# Patient Record
Sex: Male | Born: 1935 | Race: White | Hispanic: No | Marital: Married | Smoking: Never smoker
Health system: Southern US, Community
[De-identification: ages and names within clinical notes are randomized; demographics above are authoritative.]

## PROBLEM LIST (undated history)

## (undated) DIAGNOSIS — Z87442 Personal history of urinary calculi: Secondary | ICD-10-CM

## (undated) DIAGNOSIS — K219 Gastro-esophageal reflux disease without esophagitis: Secondary | ICD-10-CM

## (undated) DIAGNOSIS — Z8781 Personal history of (healed) traumatic fracture: Secondary | ICD-10-CM

## (undated) DIAGNOSIS — Z9221 Personal history of antineoplastic chemotherapy: Secondary | ICD-10-CM

## (undated) DIAGNOSIS — E538 Deficiency of other specified B group vitamins: Secondary | ICD-10-CM

## (undated) DIAGNOSIS — F419 Anxiety disorder, unspecified: Secondary | ICD-10-CM

## (undated) DIAGNOSIS — Z923 Personal history of irradiation: Secondary | ICD-10-CM

## (undated) DIAGNOSIS — R413 Other amnesia: Secondary | ICD-10-CM

## (undated) DIAGNOSIS — G9001 Carotid sinus syncope: Secondary | ICD-10-CM

## (undated) DIAGNOSIS — T7840XA Allergy, unspecified, initial encounter: Secondary | ICD-10-CM

## (undated) DIAGNOSIS — N3281 Overactive bladder: Secondary | ICD-10-CM

## (undated) DIAGNOSIS — I495 Sick sinus syndrome: Secondary | ICD-10-CM

## (undated) DIAGNOSIS — C189 Malignant neoplasm of colon, unspecified: Secondary | ICD-10-CM

## (undated) DIAGNOSIS — M1990 Unspecified osteoarthritis, unspecified site: Secondary | ICD-10-CM

## (undated) DIAGNOSIS — Z8719 Personal history of other diseases of the digestive system: Secondary | ICD-10-CM

## (undated) DIAGNOSIS — G629 Polyneuropathy, unspecified: Secondary | ICD-10-CM

## (undated) DIAGNOSIS — Z950 Presence of cardiac pacemaker: Secondary | ICD-10-CM

## (undated) DIAGNOSIS — I82409 Acute embolism and thrombosis of unspecified deep veins of unspecified lower extremity: Secondary | ICD-10-CM

## (undated) DIAGNOSIS — R001 Bradycardia, unspecified: Secondary | ICD-10-CM

## (undated) HISTORY — PX: HERNIA REPAIR: SHX51

## (undated) HISTORY — DX: Malignant neoplasm of colon, unspecified: C18.9

## (undated) HISTORY — PX: EXPLORATORY LAPAROTOMY: SUR591

## (undated) HISTORY — DX: Personal history of urinary calculi: Z87.442

## (undated) HISTORY — PX: TONSILLECTOMY: SUR1361

## (undated) HISTORY — PX: COLECTOMY: SHX59

## (undated) HISTORY — PX: LITHOTRIPSY: SUR834

## (undated) HISTORY — DX: Sick sinus syndrome: I49.5

## (undated) HISTORY — PX: COLON RESECTION: SHX5231

## (undated) HISTORY — PX: APPENDECTOMY: SHX54

## (undated) HISTORY — DX: Acute embolism and thrombosis of unspecified deep veins of unspecified lower extremity: I82.409

## (undated) HISTORY — DX: Overactive bladder: N32.81

## (undated) HISTORY — PX: TOTAL KNEE ARTHROPLASTY: SHX125

## (undated) HISTORY — PX: CATARACT EXTRACTION: SUR2

## (undated) HISTORY — DX: Polyneuropathy, unspecified: G62.9

## (undated) HISTORY — DX: Deficiency of other specified B group vitamins: E53.8

## (undated) HISTORY — PX: REPLACEMENT TOTAL KNEE BILATERAL: SUR1225

## (undated) HISTORY — DX: Unspecified osteoarthritis, unspecified site: M19.90

## (undated) HISTORY — PX: OTHER SURGICAL HISTORY: SHX169

## (undated) HISTORY — DX: Personal history of (healed) traumatic fracture: Z87.81

## (undated) HISTORY — DX: Carotid sinus syncope: G90.01

---

## 2000-05-17 ENCOUNTER — Encounter (HOSPITAL_COMMUNITY): Admission: RE | Admit: 2000-05-17 | Discharge: 2000-06-16 | Payer: Self-pay | Admitting: Oncology

## 2000-05-17 ENCOUNTER — Encounter: Admission: RE | Admit: 2000-05-17 | Discharge: 2000-05-17 | Payer: Self-pay | Admitting: Oncology

## 2000-06-07 ENCOUNTER — Encounter (HOSPITAL_COMMUNITY): Payer: Self-pay | Admitting: Oncology

## 2000-06-17 ENCOUNTER — Encounter: Admission: RE | Admit: 2000-06-17 | Discharge: 2000-06-17 | Payer: Self-pay | Admitting: Oncology

## 2000-06-17 ENCOUNTER — Encounter (HOSPITAL_COMMUNITY): Admission: RE | Admit: 2000-06-17 | Discharge: 2000-07-17 | Payer: Self-pay | Admitting: Oncology

## 2000-07-17 ENCOUNTER — Encounter (HOSPITAL_COMMUNITY): Admission: RE | Admit: 2000-07-17 | Discharge: 2000-08-16 | Payer: Self-pay | Admitting: Oncology

## 2000-07-17 ENCOUNTER — Encounter: Admission: RE | Admit: 2000-07-17 | Discharge: 2000-07-17 | Payer: Self-pay | Admitting: Oncology

## 2000-07-22 ENCOUNTER — Inpatient Hospital Stay (HOSPITAL_COMMUNITY): Admission: AD | Admit: 2000-07-22 | Discharge: 2000-07-24 | Payer: Self-pay | Admitting: General Surgery

## 2000-07-23 ENCOUNTER — Encounter: Payer: Self-pay | Admitting: General Surgery

## 2000-08-09 ENCOUNTER — Ambulatory Visit: Admission: RE | Admit: 2000-08-09 | Discharge: 2000-11-07 | Payer: Self-pay | Admitting: *Deleted

## 2000-08-26 ENCOUNTER — Encounter (HOSPITAL_COMMUNITY): Admission: RE | Admit: 2000-08-26 | Discharge: 2000-09-25 | Payer: Self-pay | Admitting: Oncology

## 2000-08-26 ENCOUNTER — Encounter: Admission: RE | Admit: 2000-08-26 | Discharge: 2000-08-26 | Payer: Self-pay | Admitting: Oncology

## 2000-09-27 ENCOUNTER — Encounter: Admission: RE | Admit: 2000-09-27 | Discharge: 2000-09-27 | Payer: Self-pay | Admitting: Oncology

## 2000-09-27 ENCOUNTER — Encounter (HOSPITAL_COMMUNITY): Admission: RE | Admit: 2000-09-27 | Discharge: 2000-10-27 | Payer: Self-pay | Admitting: Oncology

## 2000-10-28 ENCOUNTER — Encounter: Admission: RE | Admit: 2000-10-28 | Discharge: 2000-10-28 | Payer: Self-pay | Admitting: Oncology

## 2000-10-28 ENCOUNTER — Encounter (HOSPITAL_COMMUNITY): Admission: RE | Admit: 2000-10-28 | Discharge: 2000-11-27 | Payer: Self-pay | Admitting: Oncology

## 2000-11-29 ENCOUNTER — Encounter (HOSPITAL_COMMUNITY): Admission: RE | Admit: 2000-11-29 | Discharge: 2000-12-29 | Payer: Self-pay

## 2000-12-24 ENCOUNTER — Encounter (HOSPITAL_COMMUNITY): Payer: Self-pay | Admitting: Oncology

## 2000-12-31 ENCOUNTER — Encounter (HOSPITAL_COMMUNITY): Admission: RE | Admit: 2000-12-31 | Discharge: 2001-01-30 | Payer: Self-pay | Admitting: Oncology

## 2000-12-31 ENCOUNTER — Encounter: Admission: RE | Admit: 2000-12-31 | Discharge: 2000-12-31 | Payer: Self-pay | Admitting: Oncology

## 2001-01-01 ENCOUNTER — Encounter (HOSPITAL_COMMUNITY): Payer: Self-pay | Admitting: Oncology

## 2001-02-04 ENCOUNTER — Encounter: Admission: RE | Admit: 2001-02-04 | Discharge: 2001-02-04 | Payer: Self-pay | Admitting: Oncology

## 2001-02-04 ENCOUNTER — Encounter (HOSPITAL_COMMUNITY): Admission: RE | Admit: 2001-02-04 | Discharge: 2001-03-06 | Payer: Self-pay | Admitting: Oncology

## 2001-03-05 ENCOUNTER — Encounter (HOSPITAL_COMMUNITY): Payer: Self-pay | Admitting: Oncology

## 2001-03-12 ENCOUNTER — Encounter (HOSPITAL_COMMUNITY): Admission: RE | Admit: 2001-03-12 | Discharge: 2001-04-11 | Payer: Self-pay | Admitting: Oncology

## 2001-03-13 ENCOUNTER — Emergency Department (HOSPITAL_COMMUNITY): Admission: EM | Admit: 2001-03-13 | Discharge: 2001-03-13 | Payer: Self-pay | Admitting: Emergency Medicine

## 2001-03-13 ENCOUNTER — Encounter: Payer: Self-pay | Admitting: Emergency Medicine

## 2001-04-07 ENCOUNTER — Encounter (HOSPITAL_COMMUNITY): Payer: Self-pay | Admitting: Oncology

## 2001-04-09 ENCOUNTER — Encounter (HOSPITAL_COMMUNITY): Payer: Self-pay | Admitting: Oncology

## 2001-04-09 ENCOUNTER — Inpatient Hospital Stay (HOSPITAL_COMMUNITY): Admission: AD | Admit: 2001-04-09 | Discharge: 2001-04-11 | Payer: Self-pay | Admitting: Internal Medicine

## 2001-04-15 ENCOUNTER — Encounter: Admission: RE | Admit: 2001-04-15 | Discharge: 2001-04-15 | Payer: Self-pay | Admitting: Oncology

## 2001-04-15 ENCOUNTER — Encounter (HOSPITAL_COMMUNITY): Payer: Self-pay | Admitting: Oncology

## 2001-04-15 ENCOUNTER — Encounter (HOSPITAL_COMMUNITY): Admission: RE | Admit: 2001-04-15 | Discharge: 2001-05-15 | Payer: Self-pay | Admitting: Oncology

## 2001-04-25 ENCOUNTER — Encounter (HOSPITAL_COMMUNITY): Payer: Self-pay | Admitting: Oncology

## 2001-05-01 ENCOUNTER — Encounter: Payer: Self-pay | Admitting: *Deleted

## 2001-05-01 ENCOUNTER — Inpatient Hospital Stay (HOSPITAL_COMMUNITY): Admission: EM | Admit: 2001-05-01 | Discharge: 2001-05-02 | Payer: Self-pay | Admitting: *Deleted

## 2001-05-21 ENCOUNTER — Encounter (HOSPITAL_COMMUNITY): Admission: RE | Admit: 2001-05-21 | Discharge: 2001-06-20 | Payer: Self-pay | Admitting: Oncology

## 2001-06-25 ENCOUNTER — Encounter: Admission: RE | Admit: 2001-06-25 | Discharge: 2001-06-25 | Payer: Self-pay | Admitting: Oncology

## 2001-06-25 ENCOUNTER — Encounter (HOSPITAL_COMMUNITY): Admission: RE | Admit: 2001-06-25 | Discharge: 2001-07-25 | Payer: Self-pay | Admitting: Oncology

## 2001-07-31 ENCOUNTER — Encounter (HOSPITAL_COMMUNITY): Admission: RE | Admit: 2001-07-31 | Discharge: 2001-08-30 | Payer: Self-pay | Admitting: Oncology

## 2001-07-31 ENCOUNTER — Encounter: Admission: RE | Admit: 2001-07-31 | Discharge: 2001-07-31 | Payer: Self-pay | Admitting: Oncology

## 2001-08-15 ENCOUNTER — Encounter (HOSPITAL_COMMUNITY): Payer: Self-pay | Admitting: Oncology

## 2001-08-26 ENCOUNTER — Encounter (HOSPITAL_COMMUNITY): Payer: Self-pay | Admitting: Oncology

## 2001-09-09 ENCOUNTER — Encounter (HOSPITAL_COMMUNITY): Admission: RE | Admit: 2001-09-09 | Discharge: 2001-10-09 | Payer: Self-pay | Admitting: Oncology

## 2001-09-09 ENCOUNTER — Encounter: Admission: RE | Admit: 2001-09-09 | Discharge: 2001-09-09 | Payer: Self-pay | Admitting: Oncology

## 2001-11-05 ENCOUNTER — Encounter: Admission: RE | Admit: 2001-11-05 | Discharge: 2001-11-05 | Payer: Self-pay | Admitting: Oncology

## 2001-11-05 ENCOUNTER — Encounter (HOSPITAL_COMMUNITY): Admission: RE | Admit: 2001-11-05 | Discharge: 2001-12-05 | Payer: Self-pay | Admitting: Oncology

## 2001-12-15 ENCOUNTER — Encounter (HOSPITAL_COMMUNITY): Admission: RE | Admit: 2001-12-15 | Discharge: 2002-01-14 | Payer: Self-pay | Admitting: Oncology

## 2001-12-15 ENCOUNTER — Encounter: Admission: RE | Admit: 2001-12-15 | Discharge: 2001-12-15 | Payer: Self-pay | Admitting: Oncology

## 2002-02-12 ENCOUNTER — Encounter (HOSPITAL_COMMUNITY): Admission: RE | Admit: 2002-02-12 | Discharge: 2002-03-14 | Payer: Self-pay | Admitting: Oncology

## 2002-02-12 ENCOUNTER — Encounter: Admission: RE | Admit: 2002-02-12 | Discharge: 2002-02-12 | Payer: Self-pay | Admitting: Oncology

## 2002-02-26 ENCOUNTER — Encounter (HOSPITAL_COMMUNITY): Payer: Self-pay | Admitting: Oncology

## 2002-03-09 ENCOUNTER — Encounter (HOSPITAL_COMMUNITY): Payer: Self-pay | Admitting: Oncology

## 2002-03-25 ENCOUNTER — Encounter (HOSPITAL_COMMUNITY): Admission: RE | Admit: 2002-03-25 | Discharge: 2002-04-24 | Payer: Self-pay | Admitting: Oncology

## 2002-03-25 ENCOUNTER — Encounter: Admission: RE | Admit: 2002-03-25 | Discharge: 2002-03-25 | Payer: Self-pay | Admitting: Oncology

## 2002-04-27 ENCOUNTER — Encounter (HOSPITAL_COMMUNITY): Admission: RE | Admit: 2002-04-27 | Discharge: 2002-05-27 | Payer: Self-pay | Admitting: Oncology

## 2002-04-27 ENCOUNTER — Encounter: Admission: RE | Admit: 2002-04-27 | Discharge: 2002-04-27 | Payer: Self-pay | Admitting: Oncology

## 2002-05-26 ENCOUNTER — Encounter (HOSPITAL_COMMUNITY): Admission: RE | Admit: 2002-05-26 | Discharge: 2002-06-25 | Payer: Self-pay | Admitting: Oncology

## 2002-05-26 ENCOUNTER — Encounter: Admission: RE | Admit: 2002-05-26 | Discharge: 2002-05-26 | Payer: Self-pay | Admitting: Oncology

## 2002-05-28 ENCOUNTER — Ambulatory Visit (HOSPITAL_COMMUNITY): Admission: RE | Admit: 2002-05-28 | Discharge: 2002-05-28 | Payer: Self-pay | Admitting: General Surgery

## 2002-06-26 ENCOUNTER — Encounter: Admission: RE | Admit: 2002-06-26 | Discharge: 2002-06-26 | Payer: Self-pay | Admitting: Oncology

## 2002-06-26 ENCOUNTER — Encounter (HOSPITAL_COMMUNITY): Admission: RE | Admit: 2002-06-26 | Discharge: 2002-07-26 | Payer: Self-pay | Admitting: Oncology

## 2002-07-31 ENCOUNTER — Encounter (HOSPITAL_COMMUNITY): Admission: RE | Admit: 2002-07-31 | Discharge: 2002-08-30 | Payer: Self-pay | Admitting: Oncology

## 2002-07-31 ENCOUNTER — Encounter: Admission: RE | Admit: 2002-07-31 | Discharge: 2002-07-31 | Payer: Self-pay | Admitting: Oncology

## 2002-09-14 ENCOUNTER — Encounter (HOSPITAL_COMMUNITY): Admission: RE | Admit: 2002-09-14 | Discharge: 2002-10-14 | Payer: Self-pay | Admitting: Oncology

## 2002-09-14 ENCOUNTER — Encounter: Admission: RE | Admit: 2002-09-14 | Discharge: 2002-09-14 | Payer: Self-pay | Admitting: Oncology

## 2002-10-19 ENCOUNTER — Encounter (HOSPITAL_COMMUNITY): Admission: RE | Admit: 2002-10-19 | Discharge: 2002-11-18 | Payer: Self-pay | Admitting: Oncology

## 2002-10-19 ENCOUNTER — Encounter: Admission: RE | Admit: 2002-10-19 | Discharge: 2002-10-19 | Payer: Self-pay | Admitting: Oncology

## 2002-12-01 ENCOUNTER — Encounter (HOSPITAL_COMMUNITY): Admission: RE | Admit: 2002-12-01 | Discharge: 2002-12-31 | Payer: Self-pay | Admitting: Oncology

## 2002-12-01 ENCOUNTER — Encounter: Admission: RE | Admit: 2002-12-01 | Discharge: 2002-12-01 | Payer: Self-pay | Admitting: Oncology

## 2003-01-04 ENCOUNTER — Encounter (HOSPITAL_COMMUNITY): Admission: RE | Admit: 2003-01-04 | Discharge: 2003-02-03 | Payer: Self-pay | Admitting: Oncology

## 2003-01-04 ENCOUNTER — Encounter: Admission: RE | Admit: 2003-01-04 | Discharge: 2003-01-04 | Payer: Self-pay | Admitting: Oncology

## 2003-02-23 ENCOUNTER — Encounter: Admission: RE | Admit: 2003-02-23 | Discharge: 2003-02-23 | Payer: Self-pay | Admitting: Oncology

## 2003-02-23 ENCOUNTER — Encounter (HOSPITAL_COMMUNITY): Admission: RE | Admit: 2003-02-23 | Discharge: 2003-03-25 | Payer: Self-pay | Admitting: Oncology

## 2003-03-31 ENCOUNTER — Ambulatory Visit (HOSPITAL_COMMUNITY): Admission: RE | Admit: 2003-03-31 | Discharge: 2003-03-31 | Payer: Self-pay | Admitting: Oncology

## 2003-04-20 ENCOUNTER — Encounter: Admission: RE | Admit: 2003-04-20 | Discharge: 2003-04-20 | Payer: Self-pay | Admitting: Oncology

## 2003-04-20 ENCOUNTER — Encounter (HOSPITAL_COMMUNITY): Admission: RE | Admit: 2003-04-20 | Discharge: 2003-05-20 | Payer: Self-pay | Admitting: Oncology

## 2003-06-28 ENCOUNTER — Encounter (HOSPITAL_COMMUNITY): Admission: RE | Admit: 2003-06-28 | Discharge: 2003-07-28 | Payer: Self-pay | Admitting: Oncology

## 2003-06-28 ENCOUNTER — Encounter: Admission: RE | Admit: 2003-06-28 | Discharge: 2003-06-28 | Payer: Self-pay | Admitting: Oncology

## 2003-08-23 ENCOUNTER — Encounter: Admission: RE | Admit: 2003-08-23 | Discharge: 2003-08-23 | Payer: Self-pay | Admitting: Oncology

## 2003-08-23 ENCOUNTER — Encounter (HOSPITAL_COMMUNITY): Admission: RE | Admit: 2003-08-23 | Discharge: 2003-09-22 | Payer: Self-pay | Admitting: Oncology

## 2003-10-21 ENCOUNTER — Encounter (HOSPITAL_COMMUNITY): Admission: RE | Admit: 2003-10-21 | Discharge: 2003-11-20 | Payer: Self-pay | Admitting: Oncology

## 2003-10-21 ENCOUNTER — Encounter: Admission: RE | Admit: 2003-10-21 | Discharge: 2003-10-21 | Payer: Self-pay | Admitting: Oncology

## 2003-11-18 ENCOUNTER — Ambulatory Visit (HOSPITAL_COMMUNITY): Payer: Self-pay | Admitting: Oncology

## 2003-12-21 ENCOUNTER — Encounter: Admission: RE | Admit: 2003-12-21 | Discharge: 2003-12-21 | Payer: Self-pay | Admitting: Oncology

## 2003-12-21 ENCOUNTER — Encounter (HOSPITAL_COMMUNITY): Admission: RE | Admit: 2003-12-21 | Discharge: 2004-01-20 | Payer: Self-pay | Admitting: Oncology

## 2004-01-18 ENCOUNTER — Ambulatory Visit (HOSPITAL_COMMUNITY): Payer: Self-pay | Admitting: Oncology

## 2004-02-18 ENCOUNTER — Encounter (HOSPITAL_COMMUNITY): Admission: RE | Admit: 2004-02-18 | Discharge: 2004-03-19 | Payer: Self-pay | Admitting: Oncology

## 2004-02-18 ENCOUNTER — Encounter: Admission: RE | Admit: 2004-02-18 | Discharge: 2004-02-18 | Payer: Self-pay | Admitting: Oncology

## 2004-03-27 ENCOUNTER — Ambulatory Visit (HOSPITAL_COMMUNITY): Admission: RE | Admit: 2004-03-27 | Discharge: 2004-03-27 | Payer: Self-pay | Admitting: Oncology

## 2004-04-04 ENCOUNTER — Ambulatory Visit (HOSPITAL_COMMUNITY): Payer: Self-pay | Admitting: Oncology

## 2004-04-04 ENCOUNTER — Encounter: Admission: RE | Admit: 2004-04-04 | Discharge: 2004-04-04 | Payer: Self-pay | Admitting: Oncology

## 2004-04-04 ENCOUNTER — Encounter (HOSPITAL_COMMUNITY): Admission: RE | Admit: 2004-04-04 | Discharge: 2004-05-04 | Payer: Self-pay | Admitting: Oncology

## 2004-05-08 ENCOUNTER — Encounter (HOSPITAL_COMMUNITY): Admission: RE | Admit: 2004-05-08 | Discharge: 2004-06-07 | Payer: Self-pay | Admitting: Oncology

## 2004-05-08 ENCOUNTER — Encounter: Admission: RE | Admit: 2004-05-08 | Discharge: 2004-05-08 | Payer: Self-pay | Admitting: Oncology

## 2004-05-31 ENCOUNTER — Ambulatory Visit (HOSPITAL_COMMUNITY): Payer: Self-pay | Admitting: Oncology

## 2004-06-07 ENCOUNTER — Inpatient Hospital Stay (HOSPITAL_COMMUNITY): Admission: RE | Admit: 2004-06-07 | Discharge: 2004-06-11 | Payer: Self-pay | Admitting: Orthopedic Surgery

## 2004-06-14 ENCOUNTER — Encounter: Admission: RE | Admit: 2004-06-14 | Discharge: 2004-06-14 | Payer: Self-pay | Admitting: Oncology

## 2004-06-14 ENCOUNTER — Encounter (HOSPITAL_COMMUNITY): Admission: RE | Admit: 2004-06-14 | Discharge: 2004-07-14 | Payer: Self-pay | Admitting: Oncology

## 2004-07-27 ENCOUNTER — Encounter: Admission: RE | Admit: 2004-07-27 | Discharge: 2004-07-27 | Payer: Self-pay | Admitting: Oncology

## 2004-07-27 ENCOUNTER — Ambulatory Visit (HOSPITAL_COMMUNITY): Payer: Self-pay | Admitting: Oncology

## 2004-07-27 ENCOUNTER — Encounter (HOSPITAL_COMMUNITY): Admission: RE | Admit: 2004-07-27 | Discharge: 2004-08-26 | Payer: Self-pay | Admitting: Oncology

## 2004-08-16 ENCOUNTER — Emergency Department (HOSPITAL_COMMUNITY): Admission: EM | Admit: 2004-08-16 | Discharge: 2004-08-16 | Payer: Self-pay | Admitting: Emergency Medicine

## 2004-09-04 ENCOUNTER — Encounter (HOSPITAL_COMMUNITY): Admission: RE | Admit: 2004-09-04 | Discharge: 2004-10-04 | Payer: Self-pay | Admitting: Oncology

## 2004-09-04 ENCOUNTER — Encounter: Admission: RE | Admit: 2004-09-04 | Discharge: 2004-09-04 | Payer: Self-pay | Admitting: Oncology

## 2004-10-02 ENCOUNTER — Ambulatory Visit (HOSPITAL_COMMUNITY): Payer: Self-pay | Admitting: Oncology

## 2004-10-30 ENCOUNTER — Encounter: Admission: RE | Admit: 2004-10-30 | Discharge: 2004-10-30 | Payer: Self-pay | Admitting: Oncology

## 2004-10-30 ENCOUNTER — Encounter (HOSPITAL_COMMUNITY): Admission: RE | Admit: 2004-10-30 | Discharge: 2004-11-29 | Payer: Self-pay | Admitting: Oncology

## 2004-11-27 ENCOUNTER — Ambulatory Visit (HOSPITAL_COMMUNITY): Payer: Self-pay | Admitting: Oncology

## 2004-12-25 ENCOUNTER — Encounter: Admission: RE | Admit: 2004-12-25 | Discharge: 2004-12-25 | Payer: Self-pay | Admitting: Oncology

## 2004-12-25 ENCOUNTER — Encounter (HOSPITAL_COMMUNITY): Admission: RE | Admit: 2004-12-25 | Discharge: 2004-12-25 | Payer: Self-pay | Admitting: Oncology

## 2005-01-22 ENCOUNTER — Encounter: Admission: RE | Admit: 2005-01-22 | Discharge: 2005-01-22 | Payer: Self-pay | Admitting: Oncology

## 2005-01-22 ENCOUNTER — Encounter (HOSPITAL_COMMUNITY): Admission: RE | Admit: 2005-01-22 | Discharge: 2005-02-21 | Payer: Self-pay | Admitting: Oncology

## 2005-01-22 ENCOUNTER — Ambulatory Visit (HOSPITAL_COMMUNITY): Payer: Self-pay | Admitting: Oncology

## 2005-03-15 ENCOUNTER — Encounter: Admission: RE | Admit: 2005-03-15 | Discharge: 2005-03-15 | Payer: Self-pay | Admitting: Oncology

## 2005-03-15 ENCOUNTER — Encounter (HOSPITAL_COMMUNITY): Admission: RE | Admit: 2005-03-15 | Discharge: 2005-04-14 | Payer: Self-pay | Admitting: Oncology

## 2005-03-15 ENCOUNTER — Ambulatory Visit (HOSPITAL_COMMUNITY): Payer: Self-pay | Admitting: Oncology

## 2005-03-22 ENCOUNTER — Ambulatory Visit (HOSPITAL_COMMUNITY): Admission: RE | Admit: 2005-03-22 | Discharge: 2005-03-22 | Payer: Self-pay | Admitting: Oncology

## 2005-04-04 ENCOUNTER — Ambulatory Visit (HOSPITAL_COMMUNITY): Admission: RE | Admit: 2005-04-04 | Discharge: 2005-04-04 | Payer: Self-pay | Admitting: General Surgery

## 2005-04-05 ENCOUNTER — Ambulatory Visit (HOSPITAL_BASED_OUTPATIENT_CLINIC_OR_DEPARTMENT_OTHER): Admission: RE | Admit: 2005-04-05 | Discharge: 2005-04-05 | Payer: Self-pay | Admitting: Orthopedic Surgery

## 2005-04-18 ENCOUNTER — Encounter: Admission: RE | Admit: 2005-04-18 | Discharge: 2005-04-18 | Payer: Self-pay | Admitting: Oncology

## 2005-04-30 ENCOUNTER — Ambulatory Visit (HOSPITAL_COMMUNITY): Payer: Self-pay | Admitting: Oncology

## 2005-05-21 ENCOUNTER — Encounter: Admission: RE | Admit: 2005-05-21 | Discharge: 2005-05-21 | Payer: Self-pay | Admitting: Oncology

## 2005-05-21 ENCOUNTER — Encounter (HOSPITAL_COMMUNITY): Admission: RE | Admit: 2005-05-21 | Discharge: 2005-06-20 | Payer: Self-pay | Admitting: Oncology

## 2005-07-02 ENCOUNTER — Encounter: Admission: RE | Admit: 2005-07-02 | Discharge: 2005-07-02 | Payer: Self-pay | Admitting: Oncology

## 2005-07-02 ENCOUNTER — Ambulatory Visit (HOSPITAL_COMMUNITY): Payer: Self-pay | Admitting: Oncology

## 2005-07-02 ENCOUNTER — Encounter (HOSPITAL_COMMUNITY): Admission: RE | Admit: 2005-07-02 | Discharge: 2005-08-01 | Payer: Self-pay | Admitting: Oncology

## 2005-08-31 ENCOUNTER — Encounter: Admission: RE | Admit: 2005-08-31 | Discharge: 2005-08-31 | Payer: Self-pay | Admitting: Oncology

## 2005-08-31 ENCOUNTER — Ambulatory Visit (HOSPITAL_COMMUNITY): Payer: Self-pay | Admitting: Oncology

## 2005-08-31 ENCOUNTER — Encounter (HOSPITAL_COMMUNITY): Admission: RE | Admit: 2005-08-31 | Discharge: 2005-09-30 | Payer: Self-pay | Admitting: Oncology

## 2005-10-03 ENCOUNTER — Encounter (HOSPITAL_COMMUNITY): Admission: RE | Admit: 2005-10-03 | Discharge: 2005-10-12 | Payer: Self-pay | Admitting: Oncology

## 2005-10-03 ENCOUNTER — Encounter: Admission: RE | Admit: 2005-10-03 | Discharge: 2005-10-12 | Payer: Self-pay | Admitting: Oncology

## 2005-11-07 ENCOUNTER — Encounter (HOSPITAL_COMMUNITY): Admission: RE | Admit: 2005-11-07 | Discharge: 2005-12-07 | Payer: Self-pay | Admitting: Oncology

## 2005-11-07 ENCOUNTER — Encounter: Admission: RE | Admit: 2005-11-07 | Discharge: 2005-11-07 | Payer: Self-pay | Admitting: Oncology

## 2005-11-07 ENCOUNTER — Ambulatory Visit (HOSPITAL_COMMUNITY): Payer: Self-pay | Admitting: Oncology

## 2005-11-29 ENCOUNTER — Ambulatory Visit: Payer: Self-pay | Admitting: Internal Medicine

## 2005-12-12 DIAGNOSIS — Z87442 Personal history of urinary calculi: Secondary | ICD-10-CM

## 2005-12-12 DIAGNOSIS — Z85038 Personal history of other malignant neoplasm of large intestine: Secondary | ICD-10-CM | POA: Insufficient documentation

## 2005-12-12 DIAGNOSIS — Z95818 Presence of other cardiac implants and grafts: Secondary | ICD-10-CM

## 2005-12-12 DIAGNOSIS — H269 Unspecified cataract: Secondary | ICD-10-CM

## 2005-12-12 DIAGNOSIS — Z86718 Personal history of other venous thrombosis and embolism: Secondary | ICD-10-CM

## 2005-12-12 DIAGNOSIS — N318 Other neuromuscular dysfunction of bladder: Secondary | ICD-10-CM

## 2005-12-12 DIAGNOSIS — M129 Arthropathy, unspecified: Secondary | ICD-10-CM | POA: Insufficient documentation

## 2006-01-02 ENCOUNTER — Ambulatory Visit (HOSPITAL_COMMUNITY): Payer: Self-pay | Admitting: Oncology

## 2006-01-02 ENCOUNTER — Encounter (HOSPITAL_COMMUNITY): Admission: RE | Admit: 2006-01-02 | Discharge: 2006-01-14 | Payer: Self-pay | Admitting: Oncology

## 2006-01-30 ENCOUNTER — Encounter (HOSPITAL_COMMUNITY): Admission: RE | Admit: 2006-01-30 | Discharge: 2006-03-01 | Payer: Self-pay | Admitting: Oncology

## 2006-03-06 ENCOUNTER — Encounter (HOSPITAL_COMMUNITY): Admission: RE | Admit: 2006-03-06 | Discharge: 2006-04-05 | Payer: Self-pay | Admitting: Oncology

## 2006-03-06 ENCOUNTER — Ambulatory Visit (HOSPITAL_COMMUNITY): Payer: Self-pay | Admitting: Oncology

## 2006-03-18 ENCOUNTER — Ambulatory Visit (HOSPITAL_COMMUNITY): Admission: RE | Admit: 2006-03-18 | Discharge: 2006-03-18 | Payer: Self-pay | Admitting: Oncology

## 2006-04-04 ENCOUNTER — Ambulatory Visit: Payer: Self-pay | Admitting: Internal Medicine

## 2006-04-04 DIAGNOSIS — R030 Elevated blood-pressure reading, without diagnosis of hypertension: Secondary | ICD-10-CM

## 2006-04-04 DIAGNOSIS — G609 Hereditary and idiopathic neuropathy, unspecified: Secondary | ICD-10-CM | POA: Insufficient documentation

## 2006-04-04 DIAGNOSIS — M79609 Pain in unspecified limb: Secondary | ICD-10-CM | POA: Insufficient documentation

## 2006-04-08 ENCOUNTER — Encounter (INDEPENDENT_AMBULATORY_CARE_PROVIDER_SITE_OTHER): Payer: Self-pay | Admitting: Internal Medicine

## 2006-04-09 ENCOUNTER — Ambulatory Visit: Payer: Self-pay | Admitting: Internal Medicine

## 2006-04-16 ENCOUNTER — Ambulatory Visit: Payer: Self-pay | Admitting: Internal Medicine

## 2006-04-19 ENCOUNTER — Encounter (INDEPENDENT_AMBULATORY_CARE_PROVIDER_SITE_OTHER): Payer: Self-pay | Admitting: Internal Medicine

## 2006-04-23 ENCOUNTER — Ambulatory Visit: Payer: Self-pay | Admitting: Internal Medicine

## 2006-05-02 ENCOUNTER — Ambulatory Visit (HOSPITAL_COMMUNITY): Payer: Self-pay | Admitting: Oncology

## 2006-05-02 ENCOUNTER — Ambulatory Visit: Payer: Self-pay | Admitting: Internal Medicine

## 2006-05-02 ENCOUNTER — Encounter (HOSPITAL_COMMUNITY): Admission: RE | Admit: 2006-05-02 | Discharge: 2006-06-01 | Payer: Self-pay | Admitting: Oncology

## 2006-05-02 DIAGNOSIS — E538 Deficiency of other specified B group vitamins: Secondary | ICD-10-CM

## 2006-05-31 ENCOUNTER — Ambulatory Visit: Payer: Self-pay | Admitting: Internal Medicine

## 2006-06-14 ENCOUNTER — Telehealth (INDEPENDENT_AMBULATORY_CARE_PROVIDER_SITE_OTHER): Payer: Self-pay | Admitting: *Deleted

## 2006-06-28 ENCOUNTER — Ambulatory Visit (HOSPITAL_COMMUNITY): Payer: Self-pay | Admitting: Oncology

## 2006-06-28 ENCOUNTER — Ambulatory Visit: Payer: Self-pay | Admitting: Internal Medicine

## 2006-06-28 ENCOUNTER — Encounter (HOSPITAL_COMMUNITY): Admission: RE | Admit: 2006-06-28 | Discharge: 2006-07-28 | Payer: Self-pay | Admitting: Oncology

## 2006-07-26 ENCOUNTER — Ambulatory Visit: Payer: Self-pay | Admitting: Internal Medicine

## 2006-09-03 ENCOUNTER — Ambulatory Visit (HOSPITAL_COMMUNITY): Payer: Self-pay | Admitting: Oncology

## 2006-09-03 ENCOUNTER — Encounter (HOSPITAL_COMMUNITY): Admission: RE | Admit: 2006-09-03 | Discharge: 2006-10-03 | Payer: Self-pay | Admitting: Oncology

## 2006-09-03 ENCOUNTER — Ambulatory Visit: Payer: Self-pay | Admitting: Internal Medicine

## 2006-09-09 ENCOUNTER — Encounter (INDEPENDENT_AMBULATORY_CARE_PROVIDER_SITE_OTHER): Payer: Self-pay | Admitting: Internal Medicine

## 2006-10-04 ENCOUNTER — Ambulatory Visit: Payer: Self-pay | Admitting: Internal Medicine

## 2006-10-18 ENCOUNTER — Encounter (HOSPITAL_COMMUNITY): Admission: RE | Admit: 2006-10-18 | Discharge: 2006-11-17 | Payer: Self-pay | Admitting: Oncology

## 2006-11-05 ENCOUNTER — Ambulatory Visit: Payer: Self-pay | Admitting: Internal Medicine

## 2006-11-20 ENCOUNTER — Encounter (HOSPITAL_COMMUNITY): Admission: RE | Admit: 2006-11-20 | Discharge: 2006-12-20 | Payer: Self-pay | Admitting: Oncology

## 2006-11-20 ENCOUNTER — Ambulatory Visit (HOSPITAL_COMMUNITY): Payer: Self-pay | Admitting: Oncology

## 2006-12-19 ENCOUNTER — Ambulatory Visit: Payer: Self-pay | Admitting: Internal Medicine

## 2006-12-23 ENCOUNTER — Ambulatory Visit: Payer: Self-pay | Admitting: Internal Medicine

## 2006-12-23 ENCOUNTER — Ambulatory Visit (HOSPITAL_COMMUNITY): Admission: RE | Admit: 2006-12-23 | Discharge: 2006-12-23 | Payer: Self-pay | Admitting: Internal Medicine

## 2006-12-23 DIAGNOSIS — R112 Nausea with vomiting, unspecified: Secondary | ICD-10-CM

## 2006-12-23 DIAGNOSIS — K5289 Other specified noninfective gastroenteritis and colitis: Secondary | ICD-10-CM

## 2006-12-23 LAB — CONVERTED CEMR LAB
ALT: 9 units/L (ref 0–53)
AST: 16 units/L (ref 0–37)
Albumin: 3.2 g/dL — ABNORMAL LOW (ref 3.5–5.2)
BUN: 10 mg/dL (ref 6–23)
Basophils Absolute: 0 10*3/uL (ref 0.0–0.1)
Basophils Relative: 0 % (ref 0–1)
CO2: 26 meq/L (ref 19–32)
Chloride: 106 meq/L (ref 96–112)
Creatinine, Ser: 1.02 mg/dL (ref 0.40–1.50)
Eosinophils Relative: 3 % (ref 0–5)
Glucose, Bld: 117 mg/dL — ABNORMAL HIGH (ref 70–99)
Hemoglobin: 14.4 g/dL (ref 13.0–17.0)
Monocytes Absolute: 0.5 10*3/uL (ref 0.1–1.0)
Monocytes Relative: 7 % (ref 3–12)
Neutro Abs: 5.6 10*3/uL (ref 1.7–7.7)
Neutrophils Relative %: 75 % (ref 43–77)
Potassium: 3.8 meq/L (ref 3.5–5.3)
Total Protein: 6.5 g/dL (ref 6.0–8.3)

## 2006-12-31 ENCOUNTER — Ambulatory Visit: Payer: Self-pay | Admitting: Internal Medicine

## 2006-12-31 DIAGNOSIS — R51 Headache: Secondary | ICD-10-CM

## 2006-12-31 DIAGNOSIS — R519 Headache, unspecified: Secondary | ICD-10-CM | POA: Insufficient documentation

## 2006-12-31 DIAGNOSIS — R413 Other amnesia: Secondary | ICD-10-CM

## 2007-01-01 ENCOUNTER — Ambulatory Visit (HOSPITAL_COMMUNITY): Admission: RE | Admit: 2007-01-01 | Discharge: 2007-01-01 | Payer: Self-pay | Admitting: Internal Medicine

## 2007-01-01 ENCOUNTER — Encounter (INDEPENDENT_AMBULATORY_CARE_PROVIDER_SITE_OTHER): Payer: Self-pay | Admitting: Internal Medicine

## 2007-01-03 ENCOUNTER — Telehealth (INDEPENDENT_AMBULATORY_CARE_PROVIDER_SITE_OTHER): Payer: Self-pay | Admitting: *Deleted

## 2007-01-24 ENCOUNTER — Encounter (HOSPITAL_COMMUNITY): Admission: RE | Admit: 2007-01-24 | Discharge: 2007-02-23 | Payer: Self-pay | Admitting: Oncology

## 2007-01-24 ENCOUNTER — Ambulatory Visit (HOSPITAL_COMMUNITY): Payer: Self-pay | Admitting: Oncology

## 2007-02-05 ENCOUNTER — Ambulatory Visit: Payer: Self-pay | Admitting: Internal Medicine

## 2007-02-05 ENCOUNTER — Ambulatory Visit (HOSPITAL_COMMUNITY): Admission: RE | Admit: 2007-02-05 | Discharge: 2007-02-05 | Payer: Self-pay | Admitting: Internal Medicine

## 2007-02-06 ENCOUNTER — Telehealth (INDEPENDENT_AMBULATORY_CARE_PROVIDER_SITE_OTHER): Payer: Self-pay | Admitting: *Deleted

## 2007-02-28 ENCOUNTER — Ambulatory Visit: Payer: Self-pay | Admitting: Internal Medicine

## 2007-02-28 DIAGNOSIS — R1110 Vomiting, unspecified: Secondary | ICD-10-CM

## 2007-02-28 DIAGNOSIS — H8309 Labyrinthitis, unspecified ear: Secondary | ICD-10-CM | POA: Insufficient documentation

## 2007-02-28 LAB — CONVERTED CEMR LAB
Albumin: 3.1 g/dL — ABNORMAL LOW (ref 3.5–5.2)
BUN: 15 mg/dL (ref 6–23)
Basophils Absolute: 0 10*3/uL (ref 0.0–0.1)
Chloride: 104 meq/L (ref 96–112)
Creatinine, Ser: 1.11 mg/dL (ref 0.40–1.50)
Eosinophils Absolute: 0.2 10*3/uL (ref 0.0–0.7)
Glucose, Bld: 108 mg/dL — ABNORMAL HIGH (ref 70–99)
HCT: 42 % (ref 39.0–52.0)
Lymphs Abs: 1 10*3/uL (ref 0.7–4.0)
MCV: 87.6 fL (ref 78.0–100.0)
Monocytes Relative: 7 % (ref 3–12)
Neutro Abs: 6.3 10*3/uL (ref 1.7–7.7)
Neutrophils Relative %: 77 % (ref 43–77)
Platelets: 299 10*3/uL (ref 150–400)
RBC: 4.79 M/uL (ref 4.22–5.81)
RDW: 14.7 % (ref 11.5–15.5)
Sodium: 139 meq/L (ref 135–145)

## 2007-03-18 ENCOUNTER — Ambulatory Visit (HOSPITAL_COMMUNITY): Payer: Self-pay | Admitting: Oncology

## 2007-03-18 ENCOUNTER — Encounter (HOSPITAL_COMMUNITY): Admission: RE | Admit: 2007-03-18 | Discharge: 2007-04-17 | Payer: Self-pay | Admitting: Oncology

## 2007-03-19 ENCOUNTER — Ambulatory Visit (HOSPITAL_COMMUNITY): Admission: RE | Admit: 2007-03-19 | Discharge: 2007-03-19 | Payer: Self-pay | Admitting: Oncology

## 2007-03-21 ENCOUNTER — Encounter (INDEPENDENT_AMBULATORY_CARE_PROVIDER_SITE_OTHER): Payer: Self-pay | Admitting: Internal Medicine

## 2007-04-02 ENCOUNTER — Telehealth (INDEPENDENT_AMBULATORY_CARE_PROVIDER_SITE_OTHER): Payer: Self-pay | Admitting: Internal Medicine

## 2007-04-07 ENCOUNTER — Ambulatory Visit: Payer: Self-pay | Admitting: Internal Medicine

## 2007-04-08 ENCOUNTER — Telehealth (INDEPENDENT_AMBULATORY_CARE_PROVIDER_SITE_OTHER): Payer: Self-pay | Admitting: *Deleted

## 2007-04-08 ENCOUNTER — Encounter (INDEPENDENT_AMBULATORY_CARE_PROVIDER_SITE_OTHER): Payer: Self-pay | Admitting: Internal Medicine

## 2007-04-08 LAB — CONVERTED CEMR LAB
Hemoglobin: 13.8 g/dL (ref 13.0–17.0)
Vitamin B-12: 1548 pg/mL — ABNORMAL HIGH (ref 211–911)

## 2007-04-18 ENCOUNTER — Encounter (HOSPITAL_COMMUNITY): Admission: RE | Admit: 2007-04-18 | Discharge: 2007-05-18 | Payer: Self-pay | Admitting: Oncology

## 2007-05-22 ENCOUNTER — Encounter (HOSPITAL_COMMUNITY): Admission: RE | Admit: 2007-05-22 | Discharge: 2007-06-21 | Payer: Self-pay | Admitting: Oncology

## 2007-05-22 ENCOUNTER — Ambulatory Visit (HOSPITAL_COMMUNITY): Payer: Self-pay | Admitting: Oncology

## 2007-06-10 ENCOUNTER — Ambulatory Visit: Payer: Self-pay | Admitting: Internal Medicine

## 2007-06-30 ENCOUNTER — Encounter (HOSPITAL_COMMUNITY): Admission: RE | Admit: 2007-06-30 | Discharge: 2007-07-30 | Payer: Self-pay | Admitting: Oncology

## 2007-07-28 ENCOUNTER — Ambulatory Visit (HOSPITAL_COMMUNITY): Payer: Self-pay | Admitting: Oncology

## 2007-07-30 ENCOUNTER — Ambulatory Visit: Payer: Self-pay | Admitting: Internal Medicine

## 2007-07-30 DIAGNOSIS — R197 Diarrhea, unspecified: Secondary | ICD-10-CM

## 2007-07-30 DIAGNOSIS — E861 Hypovolemia: Secondary | ICD-10-CM

## 2007-07-30 LAB — CONVERTED CEMR LAB
AST: 15 units/L (ref 0–37)
Albumin: 3 g/dL — ABNORMAL LOW (ref 3.5–5.2)
Blood Glucose, Fingerstick: 13
Calcium: 8.7 mg/dL (ref 8.4–10.5)
Creatinine, Ser: 1.23 mg/dL (ref 0.40–1.50)
Eosinophils Relative: 8 % — ABNORMAL HIGH (ref 0–5)
Glucose, Bld: 99 mg/dL (ref 70–99)
Lymphocytes Relative: 18 % (ref 12–46)
Lymphs Abs: 1.1 10*3/uL (ref 0.7–4.0)
MCHC: 33.5 g/dL (ref 30.0–36.0)
MCV: 90.3 fL (ref 78.0–100.0)
Monocytes Absolute: 0.5 10*3/uL (ref 0.1–1.0)
Monocytes Relative: 9 % (ref 3–12)
Neutro Abs: 4 10*3/uL (ref 1.7–7.7)
Potassium: 4 meq/L (ref 3.5–5.3)
RBC: 4.46 M/uL (ref 4.22–5.81)
Total Protein: 5.8 g/dL — ABNORMAL LOW (ref 6.0–8.3)
WBC: 6.2 10*3/uL (ref 4.0–10.5)

## 2007-07-31 ENCOUNTER — Encounter (INDEPENDENT_AMBULATORY_CARE_PROVIDER_SITE_OTHER): Payer: Self-pay | Admitting: Internal Medicine

## 2007-08-05 ENCOUNTER — Telehealth (INDEPENDENT_AMBULATORY_CARE_PROVIDER_SITE_OTHER): Payer: Self-pay | Admitting: *Deleted

## 2007-08-25 ENCOUNTER — Encounter (HOSPITAL_COMMUNITY): Admission: RE | Admit: 2007-08-25 | Discharge: 2007-09-24 | Payer: Self-pay | Admitting: Oncology

## 2007-08-26 ENCOUNTER — Encounter (INDEPENDENT_AMBULATORY_CARE_PROVIDER_SITE_OTHER): Payer: Self-pay | Admitting: Internal Medicine

## 2007-08-28 ENCOUNTER — Telehealth (INDEPENDENT_AMBULATORY_CARE_PROVIDER_SITE_OTHER): Payer: Self-pay | Admitting: Internal Medicine

## 2007-09-02 ENCOUNTER — Ambulatory Visit: Payer: Self-pay | Admitting: Internal Medicine

## 2007-09-18 ENCOUNTER — Ambulatory Visit (HOSPITAL_COMMUNITY): Payer: Self-pay | Admitting: Oncology

## 2007-09-23 ENCOUNTER — Encounter (INDEPENDENT_AMBULATORY_CARE_PROVIDER_SITE_OTHER): Payer: Self-pay | Admitting: Internal Medicine

## 2007-10-06 ENCOUNTER — Ambulatory Visit: Payer: Self-pay | Admitting: Internal Medicine

## 2007-10-08 ENCOUNTER — Encounter (INDEPENDENT_AMBULATORY_CARE_PROVIDER_SITE_OTHER): Payer: Self-pay | Admitting: Internal Medicine

## 2007-10-14 ENCOUNTER — Encounter (HOSPITAL_COMMUNITY): Admission: RE | Admit: 2007-10-14 | Discharge: 2007-11-13 | Payer: Self-pay | Admitting: Oncology

## 2007-10-15 ENCOUNTER — Encounter (INDEPENDENT_AMBULATORY_CARE_PROVIDER_SITE_OTHER): Payer: Self-pay | Admitting: Internal Medicine

## 2007-11-11 ENCOUNTER — Ambulatory Visit (HOSPITAL_COMMUNITY): Payer: Self-pay | Admitting: Oncology

## 2007-11-17 ENCOUNTER — Ambulatory Visit: Payer: Self-pay | Admitting: Internal Medicine

## 2007-11-17 ENCOUNTER — Ambulatory Visit (HOSPITAL_COMMUNITY): Admission: RE | Admit: 2007-11-17 | Discharge: 2007-11-17 | Payer: Self-pay | Admitting: Internal Medicine

## 2007-11-17 DIAGNOSIS — R109 Unspecified abdominal pain: Secondary | ICD-10-CM | POA: Insufficient documentation

## 2007-11-19 ENCOUNTER — Encounter (INDEPENDENT_AMBULATORY_CARE_PROVIDER_SITE_OTHER): Payer: Self-pay | Admitting: Internal Medicine

## 2007-11-25 ENCOUNTER — Encounter (INDEPENDENT_AMBULATORY_CARE_PROVIDER_SITE_OTHER): Payer: Self-pay | Admitting: Internal Medicine

## 2007-11-26 ENCOUNTER — Ambulatory Visit: Payer: Self-pay | Admitting: Internal Medicine

## 2007-11-26 ENCOUNTER — Telehealth (INDEPENDENT_AMBULATORY_CARE_PROVIDER_SITE_OTHER): Payer: Self-pay | Admitting: *Deleted

## 2007-11-26 ENCOUNTER — Ambulatory Visit (HOSPITAL_COMMUNITY): Admission: RE | Admit: 2007-11-26 | Discharge: 2007-11-26 | Payer: Self-pay | Admitting: Internal Medicine

## 2007-11-26 LAB — CONVERTED CEMR LAB
ALT: 13 units/L (ref 0–53)
Albumin: 3.2 g/dL — ABNORMAL LOW (ref 3.5–5.2)
Alkaline Phosphatase: 53 units/L (ref 39–117)
Basophils Relative: 0 % (ref 0–1)
CO2: 29 meq/L (ref 19–32)
Calcium: 8.9 mg/dL (ref 8.4–10.5)
Chloride: 105 meq/L (ref 96–112)
Creatinine, Ser: 1.25 mg/dL (ref 0.40–1.50)
Glucose, Bld: 86 mg/dL (ref 70–99)
Lymphocytes Relative: 16 % (ref 12–46)
Lymphs Abs: 1.2 10*3/uL (ref 0.7–4.0)
Monocytes Absolute: 0.7 10*3/uL (ref 0.1–1.0)
Monocytes Relative: 10 % (ref 3–12)
Neutro Abs: 4.9 10*3/uL (ref 1.7–7.7)
Neutrophils Relative %: 67 % (ref 43–77)
Potassium: 4 meq/L (ref 3.5–5.3)
Sodium: 139 meq/L (ref 135–145)
Total Bilirubin: 0.5 mg/dL (ref 0.3–1.2)
WBC: 7.3 10*3/uL (ref 4.0–10.5)

## 2007-12-02 ENCOUNTER — Ambulatory Visit: Payer: Self-pay | Admitting: Internal Medicine

## 2007-12-05 ENCOUNTER — Encounter (HOSPITAL_COMMUNITY): Admission: RE | Admit: 2007-12-05 | Discharge: 2008-01-04 | Payer: Self-pay | Admitting: Oncology

## 2007-12-05 ENCOUNTER — Encounter (INDEPENDENT_AMBULATORY_CARE_PROVIDER_SITE_OTHER): Payer: Self-pay | Admitting: Internal Medicine

## 2007-12-10 ENCOUNTER — Ambulatory Visit: Payer: Self-pay | Admitting: Internal Medicine

## 2007-12-29 ENCOUNTER — Ambulatory Visit (HOSPITAL_COMMUNITY): Admission: RE | Admit: 2007-12-29 | Discharge: 2007-12-29 | Payer: Self-pay | Admitting: Internal Medicine

## 2007-12-29 ENCOUNTER — Encounter: Payer: Self-pay | Admitting: Internal Medicine

## 2007-12-29 ENCOUNTER — Encounter (INDEPENDENT_AMBULATORY_CARE_PROVIDER_SITE_OTHER): Payer: Self-pay | Admitting: Internal Medicine

## 2007-12-29 ENCOUNTER — Ambulatory Visit: Payer: Self-pay | Admitting: Internal Medicine

## 2008-01-30 ENCOUNTER — Ambulatory Visit (HOSPITAL_COMMUNITY): Payer: Self-pay | Admitting: Oncology

## 2008-01-30 ENCOUNTER — Encounter (HOSPITAL_COMMUNITY): Admission: RE | Admit: 2008-01-30 | Discharge: 2008-02-29 | Payer: Self-pay | Admitting: Oncology

## 2008-02-04 ENCOUNTER — Encounter (INDEPENDENT_AMBULATORY_CARE_PROVIDER_SITE_OTHER): Payer: Self-pay | Admitting: Internal Medicine

## 2008-02-27 ENCOUNTER — Ambulatory Visit (HOSPITAL_COMMUNITY): Payer: Self-pay | Admitting: Oncology

## 2008-03-24 ENCOUNTER — Ambulatory Visit (HOSPITAL_COMMUNITY): Admission: RE | Admit: 2008-03-24 | Discharge: 2008-03-24 | Payer: Self-pay | Admitting: Oncology

## 2008-03-26 ENCOUNTER — Encounter (HOSPITAL_COMMUNITY): Admission: RE | Admit: 2008-03-26 | Discharge: 2008-04-25 | Payer: Self-pay | Admitting: Oncology

## 2008-04-14 ENCOUNTER — Ambulatory Visit (HOSPITAL_COMMUNITY): Payer: Self-pay | Admitting: Oncology

## 2008-04-14 ENCOUNTER — Encounter (INDEPENDENT_AMBULATORY_CARE_PROVIDER_SITE_OTHER): Payer: Self-pay | Admitting: Internal Medicine

## 2008-04-22 ENCOUNTER — Encounter: Payer: Self-pay | Admitting: Cardiology

## 2008-05-17 ENCOUNTER — Encounter (HOSPITAL_COMMUNITY): Admission: RE | Admit: 2008-05-17 | Discharge: 2008-06-16 | Payer: Self-pay | Admitting: Oncology

## 2008-05-19 ENCOUNTER — Ambulatory Visit (HOSPITAL_COMMUNITY): Admission: RE | Admit: 2008-05-19 | Discharge: 2008-05-20 | Payer: Self-pay | Admitting: Cardiology

## 2008-05-19 HISTORY — PX: PACEMAKER INSERTION: SHX728

## 2008-05-24 ENCOUNTER — Encounter (INDEPENDENT_AMBULATORY_CARE_PROVIDER_SITE_OTHER): Payer: Self-pay | Admitting: Internal Medicine

## 2008-05-31 ENCOUNTER — Ambulatory Visit (HOSPITAL_COMMUNITY): Payer: Self-pay | Admitting: Oncology

## 2008-06-21 ENCOUNTER — Encounter (HOSPITAL_COMMUNITY): Admission: RE | Admit: 2008-06-21 | Discharge: 2008-07-21 | Payer: Self-pay | Admitting: Oncology

## 2008-08-02 ENCOUNTER — Ambulatory Visit (HOSPITAL_COMMUNITY): Payer: Self-pay | Admitting: Oncology

## 2008-08-02 ENCOUNTER — Encounter (HOSPITAL_COMMUNITY): Admission: RE | Admit: 2008-08-02 | Discharge: 2008-09-01 | Payer: Self-pay | Admitting: Oncology

## 2008-08-23 ENCOUNTER — Encounter (INDEPENDENT_AMBULATORY_CARE_PROVIDER_SITE_OTHER): Payer: Self-pay | Admitting: Internal Medicine

## 2008-09-24 ENCOUNTER — Ambulatory Visit (HOSPITAL_COMMUNITY): Payer: Self-pay | Admitting: Oncology

## 2008-09-24 ENCOUNTER — Encounter (HOSPITAL_COMMUNITY): Admission: RE | Admit: 2008-09-24 | Discharge: 2008-10-14 | Payer: Self-pay | Admitting: Oncology

## 2008-10-14 ENCOUNTER — Encounter (INDEPENDENT_AMBULATORY_CARE_PROVIDER_SITE_OTHER): Payer: Self-pay | Admitting: Internal Medicine

## 2008-10-21 ENCOUNTER — Encounter (HOSPITAL_COMMUNITY): Admission: RE | Admit: 2008-10-21 | Discharge: 2008-11-20 | Payer: Self-pay | Admitting: Oncology

## 2008-11-16 ENCOUNTER — Ambulatory Visit (HOSPITAL_COMMUNITY): Payer: Self-pay | Admitting: Oncology

## 2008-12-07 ENCOUNTER — Encounter (HOSPITAL_COMMUNITY): Admission: RE | Admit: 2008-12-07 | Discharge: 2009-01-06 | Payer: Self-pay | Admitting: Oncology

## 2009-01-04 ENCOUNTER — Ambulatory Visit (HOSPITAL_COMMUNITY): Payer: Self-pay | Admitting: Oncology

## 2009-01-08 ENCOUNTER — Emergency Department (HOSPITAL_COMMUNITY): Admission: EM | Admit: 2009-01-08 | Discharge: 2009-01-08 | Payer: Self-pay | Admitting: Emergency Medicine

## 2009-01-12 ENCOUNTER — Inpatient Hospital Stay (HOSPITAL_COMMUNITY): Admission: EM | Admit: 2009-01-12 | Discharge: 2009-01-17 | Payer: Self-pay | Admitting: Emergency Medicine

## 2009-01-18 ENCOUNTER — Encounter (HOSPITAL_COMMUNITY): Admission: RE | Admit: 2009-01-18 | Discharge: 2009-02-17 | Payer: Self-pay | Admitting: Oncology

## 2009-03-01 ENCOUNTER — Ambulatory Visit (HOSPITAL_COMMUNITY): Payer: Self-pay | Admitting: Oncology

## 2009-03-01 ENCOUNTER — Encounter (HOSPITAL_COMMUNITY): Admission: RE | Admit: 2009-03-01 | Discharge: 2009-03-31 | Payer: Self-pay | Admitting: Oncology

## 2009-04-05 ENCOUNTER — Encounter (HOSPITAL_COMMUNITY): Admission: RE | Admit: 2009-04-05 | Discharge: 2009-05-05 | Payer: Self-pay | Admitting: Oncology

## 2009-04-20 ENCOUNTER — Ambulatory Visit (HOSPITAL_COMMUNITY): Payer: Self-pay | Admitting: Oncology

## 2009-05-18 ENCOUNTER — Encounter (HOSPITAL_COMMUNITY): Admission: RE | Admit: 2009-05-18 | Discharge: 2009-06-17 | Payer: Self-pay | Admitting: Oncology

## 2009-06-07 ENCOUNTER — Ambulatory Visit (HOSPITAL_COMMUNITY): Payer: Self-pay | Admitting: Oncology

## 2009-07-05 ENCOUNTER — Encounter (HOSPITAL_COMMUNITY): Admission: RE | Admit: 2009-07-05 | Discharge: 2009-08-04 | Payer: Self-pay | Admitting: Oncology

## 2009-08-02 ENCOUNTER — Ambulatory Visit (HOSPITAL_COMMUNITY): Payer: Self-pay | Admitting: Oncology

## 2009-08-30 ENCOUNTER — Encounter (HOSPITAL_COMMUNITY): Admission: RE | Admit: 2009-08-30 | Discharge: 2009-09-29 | Payer: Self-pay | Admitting: Oncology

## 2009-09-07 ENCOUNTER — Ambulatory Visit: Payer: Self-pay | Admitting: Cardiology

## 2009-09-27 ENCOUNTER — Ambulatory Visit (HOSPITAL_COMMUNITY): Payer: Self-pay | Admitting: Oncology

## 2009-09-29 ENCOUNTER — Emergency Department (HOSPITAL_COMMUNITY): Admission: EM | Admit: 2009-09-29 | Discharge: 2009-09-29 | Payer: Self-pay | Admitting: Emergency Medicine

## 2009-09-30 ENCOUNTER — Ambulatory Visit (HOSPITAL_COMMUNITY): Admission: RE | Admit: 2009-09-30 | Discharge: 2009-09-30 | Payer: Self-pay | Admitting: Internal Medicine

## 2009-09-30 ENCOUNTER — Encounter (HOSPITAL_COMMUNITY): Admission: RE | Admit: 2009-09-30 | Discharge: 2009-10-14 | Payer: Self-pay | Admitting: Oncology

## 2009-10-03 ENCOUNTER — Ambulatory Visit (HOSPITAL_COMMUNITY): Payer: Self-pay | Admitting: Hematology and Oncology

## 2009-10-14 ENCOUNTER — Ambulatory Visit (HOSPITAL_COMMUNITY): Payer: Self-pay | Admitting: Oncology

## 2009-10-19 ENCOUNTER — Encounter (HOSPITAL_COMMUNITY)
Admission: RE | Admit: 2009-10-19 | Discharge: 2009-11-18 | Payer: Self-pay | Source: Home / Self Care | Admitting: Oncology

## 2009-11-17 ENCOUNTER — Ambulatory Visit (HOSPITAL_COMMUNITY): Payer: Self-pay | Admitting: Oncology

## 2009-11-18 ENCOUNTER — Ambulatory Visit: Payer: Self-pay | Admitting: Cardiology

## 2009-11-23 ENCOUNTER — Encounter (HOSPITAL_COMMUNITY)
Admission: RE | Admit: 2009-11-23 | Discharge: 2009-12-23 | Payer: Self-pay | Source: Home / Self Care | Attending: Oncology | Admitting: Oncology

## 2009-11-26 ENCOUNTER — Encounter: Payer: Self-pay | Admitting: Internal Medicine

## 2009-12-30 ENCOUNTER — Ambulatory Visit: Payer: Self-pay | Admitting: Cardiology

## 2009-12-30 ENCOUNTER — Encounter: Payer: Self-pay | Admitting: Internal Medicine

## 2010-01-02 ENCOUNTER — Ambulatory Visit (HOSPITAL_COMMUNITY): Payer: Self-pay | Admitting: Oncology

## 2010-01-02 ENCOUNTER — Encounter (HOSPITAL_COMMUNITY)
Admission: RE | Admit: 2010-01-02 | Discharge: 2010-02-01 | Payer: Self-pay | Source: Home / Self Care | Attending: Oncology | Admitting: Oncology

## 2010-01-30 LAB — PROTIME-INR
INR: 1.87 — ABNORMAL HIGH (ref 0.00–1.49)
Prothrombin Time: 21.7 seconds — ABNORMAL HIGH (ref 11.6–15.2)

## 2010-02-05 ENCOUNTER — Encounter (HOSPITAL_COMMUNITY): Payer: Self-pay | Admitting: Oncology

## 2010-02-06 ENCOUNTER — Encounter (HOSPITAL_COMMUNITY)
Admission: RE | Admit: 2010-02-06 | Discharge: 2010-02-14 | Payer: Self-pay | Source: Home / Self Care | Attending: Oncology | Admitting: Oncology

## 2010-02-07 LAB — PROTIME-INR
INR: 2.23 — ABNORMAL HIGH (ref 0.00–1.49)
Prothrombin Time: 24.8 seconds — ABNORMAL HIGH (ref 11.6–15.2)

## 2010-02-12 LAB — CONVERTED CEMR LAB
ALT: 10 units/L
ALT: 11 units/L (ref 0–53)
AST: 16 units/L
BUN: 12 mg/dL
BUN: 14 mg/dL (ref 6–23)
Basophils Absolute: 0 10*3/uL (ref 0.0–0.1)
Basophils Absolute: 0 10*3/uL (ref 0.0–0.1)
CO2: 22 meq/L (ref 19–32)
CO2: 28 meq/L
Calcium: 9.1 mg/dL
Calcium: 9.3 mg/dL (ref 8.4–10.5)
Chloride: 105 meq/L (ref 96–112)
Creatinine, Ser: 1.2 mg/dL
Eosinophils Absolute: 0.6 10*3/uL (ref 0.0–0.7)
Eosinophils Relative: 7 % — ABNORMAL HIGH (ref 0–5)
Glucose, Bld: 108 mg/dL — ABNORMAL HIGH (ref 70–99)
Glucose, Bld: 74 mg/dL
Glucose, Bld: 96 mg/dL (ref 70–99)
HCT: 44.9 % (ref 39.0–52.0)
HCT: 45.8 %
Hemoglobin: 13.8 g/dL (ref 13.0–17.0)
Hemoglobin: 15.5 g/dL
Lymphocytes Relative: 20 % (ref 12–46)
Lymphocytes Relative: 20 % (ref 12–46)
Lymphs Abs: 1.1 10*3/uL (ref 0.7–3.3)
Lymphs Abs: 1.7 10*3/uL (ref 0.7–4.0)
MCHC: 32.1 g/dL (ref 30.0–36.0)
MCV: 90.3 fL
MCV: 90.7 fL (ref 78.0–100.0)
Monocytes Absolute: 0.6 10*3/uL (ref 0.1–1.0)
Monocytes Absolute: 0.6 10*3/uL (ref 0.2–0.7)
Neutro Abs: 3.5 10*3/uL (ref 1.7–7.7)
Neutro Abs: 5.4 10*3/uL (ref 1.7–7.7)
Neutrophils Relative %: 63 % (ref 43–77)
Neutrophils Relative %: 64 % (ref 43–77)
Platelets: 261 10*3/uL
Potassium: 3.9 meq/L
Potassium: 3.9 meq/L (ref 3.5–5.3)
Potassium: 4.1 meq/L (ref 3.5–5.3)
RBC: 4.56 M/uL (ref 4.22–5.81)
RBC: 4.9 M/uL (ref 4.22–5.81)
RBC: 5.07 M/uL
Sodium: 137 meq/L
Sodium: 138 meq/L (ref 135–145)
Total Bilirubin: 0.8 mg/dL
Total Protein: 6.2 g/dL (ref 6.0–8.3)
Total Protein: 6.8 g/dL
WBC: 5.3 10*3/uL
WBC: 5.6 10*3/uL (ref 4.0–10.5)
WBC: 8.3 10*3/uL (ref 4.0–10.5)

## 2010-02-16 NOTE — Cardiovascular Report (Signed)
Summary: Office Visit   Office Visit   Imported By: Roderic Ovens 01/13/2010 16:23:41  _____________________________________________________________________  External Attachment:    Type:   Image     Comment:   External Document

## 2010-02-16 NOTE — Procedures (Signed)
Summary: pacer check/medtronic   Current Medications (verified): 1)  Coumadin 4 Mg Tabs (Warfarin Sodium) .... 2 By Mouth Once Daily 2)  Tylenol 325 Mg Tabs (Acetaminophen) .... As Needed  Allergies (verified): 1)  Celebrex  PPM Specifications Following MD:  Lewayne Bunting, MD     Referring MD:  Rolla Plate PPM Vendor:  Medtronic     PPM Model Number:  [id number]     PPM Serial Number:  [id number] PPM DOI:  05/19/2008     PPM Implanting MD:  Rolla Plate  Lead 1    Location: RA     DOI: 05/19/2008     Model #: [id number]     Serial #: [id number]     Status: active Lead 2    Location: RV     DOI: 05/19/2008     Model #: [id number]     Serial #: [id number]     Status: active  Magnet Response Rate:  BOL 85 ERI 65  Indications:  Sick sinus syndrome   PPM Follow Up Remote Check?  No Battery Voltage:  3.0 V     Pacer Dependent:  No       PPM Device Measurements Atrium  Amplitude: 2.1 mV, Impedance: 432 ohms, Threshold: 0.5 V at 0.4 msec Right Ventricle  Amplitude: 5.9 mV, Impedance: 528 ohms, Threshold: 0.5 V at 0.4 msec  Episodes Coumadin:  Yes Ventricular High Rate:  20     Atrial Pacing:  65.5%     Ventricular Pacing:  0.5%  Parameters Mode:  DDDR+     Lower Rate Limit:  60     Upper Rate Limit:  120 Paced AV Delay:  180     Sensed AV Delay:  150 Next Remote Date:  03/30/2010     Next Cardiology Appt Due:  06/16/2010 Tech Comments:  Atrial sensitivity reprogrammed 0.28mV for FFRW.  Fast AV and VT episodes all 1:1 conduction.  Carelink transmissions every 3 months.  ROV 6 months with Dr. Ladona Ridgel in [place]. Altha Harm, LPN  December 30, 2009 11:40 AM

## 2010-02-16 NOTE — Miscellaneous (Signed)
Summary: Device preload  Clinical Lists Changes  Observations: Added new observation of PPM INDICATN: Sick sinus syndrome (11/26/2009 10:39) Added new observation of MAGNET RTE: BOL 85 ERI 65 (11/26/2009 10:39) Added new observation of PPMLEADSTAT2: active (11/26/2009 10:39) Added new observation of PPMLEADSER2: [id number] (11/26/2009 10:39) Added new observation of PPMLEADMOD2: 4076  (11/26/2009 10:39) Added new observation of PPMLEADDOI2: 05/19/2008  (11/26/2009 10:39) Added new observation of PPMLEADLOC2: RV  (11/26/2009 10:39) Added new observation of PPMLEADSTAT1: active  (11/26/2009 10:39) Added new observation of PPMLEADSER1: [id number]  (11/26/2009 10:39) Added new observation of PPMLEADMOD1: 4076  (11/26/2009 10:39) Added new observation of PPMLEADDOI1: 05/19/2008  (11/26/2009 10:39) Added new observation of PPMLEADLOC1: RA  (11/26/2009 10:39) Added new observation of PPM DOI: 05/19/2008  (11/26/2009 10:39) Added new observation of PPM SERL#: [id number]  (11/26/2009 10:39) Added new observation of PPM MODL#: [id number]  (11/26/2009 10:39) Added new observation of PACEMAKERMFG: Medtronic  (11/26/2009 10:39) Added new observation of PPM IMP MD: Duffy Rhody Tennant,MD  (11/26/2009 10:39) Added new observation of PPM REFER MD: Rolla Plate  (11/26/2009 10:39) Added new observation of PACEMAKER MD: Lewayne Bunting, MD  (11/26/2009 10:39)      PPM Specifications Following MD:  Lewayne Bunting, MD     Referring MD:  Rolla Plate PPM Vendor:  Medtronic     PPM Model Number:  [id number]     PPM Serial Number:  [id number] PPM DOI:  05/19/2008     PPM Implanting MD:  Rolla Plate  Lead 1    Location: RA     DOI: 05/19/2008     Model #: [id number]     Serial #: [id number]     Status: active Lead 2    Location: RV     DOI: 05/19/2008     Model #: [id number]     Serial #: [id number]     Status: active  Magnet Response Rate:  BOL 85 ERI 65  Indications:  Sick sinus syndrome

## 2010-02-27 ENCOUNTER — Other Ambulatory Visit (HOSPITAL_COMMUNITY): Payer: MEDICARE

## 2010-02-27 ENCOUNTER — Encounter (HOSPITAL_COMMUNITY): Payer: Medicare Other | Attending: Oncology

## 2010-02-27 DIAGNOSIS — E538 Deficiency of other specified B group vitamins: Secondary | ICD-10-CM | POA: Insufficient documentation

## 2010-02-27 DIAGNOSIS — Z85038 Personal history of other malignant neoplasm of large intestine: Secondary | ICD-10-CM | POA: Insufficient documentation

## 2010-02-27 DIAGNOSIS — R413 Other amnesia: Secondary | ICD-10-CM | POA: Insufficient documentation

## 2010-02-27 DIAGNOSIS — D682 Hereditary deficiency of other clotting factors: Secondary | ICD-10-CM | POA: Insufficient documentation

## 2010-02-27 DIAGNOSIS — Z79899 Other long term (current) drug therapy: Secondary | ICD-10-CM | POA: Insufficient documentation

## 2010-02-27 DIAGNOSIS — R5381 Other malaise: Secondary | ICD-10-CM | POA: Insufficient documentation

## 2010-02-27 DIAGNOSIS — R209 Unspecified disturbances of skin sensation: Secondary | ICD-10-CM | POA: Insufficient documentation

## 2010-02-27 DIAGNOSIS — D6859 Other primary thrombophilia: Secondary | ICD-10-CM | POA: Insufficient documentation

## 2010-02-27 DIAGNOSIS — R5383 Other fatigue: Secondary | ICD-10-CM | POA: Insufficient documentation

## 2010-02-27 DIAGNOSIS — I82409 Acute embolism and thrombosis of unspecified deep veins of unspecified lower extremity: Secondary | ICD-10-CM

## 2010-02-27 DIAGNOSIS — Z7901 Long term (current) use of anticoagulants: Secondary | ICD-10-CM | POA: Insufficient documentation

## 2010-02-27 DIAGNOSIS — Z86718 Personal history of other venous thrombosis and embolism: Secondary | ICD-10-CM | POA: Insufficient documentation

## 2010-02-28 ENCOUNTER — Ambulatory Visit (HOSPITAL_BASED_OUTPATIENT_CLINIC_OR_DEPARTMENT_OTHER): Payer: MEDICARE | Admitting: Oncology

## 2010-02-28 DIAGNOSIS — I82409 Acute embolism and thrombosis of unspecified deep veins of unspecified lower extremity: Secondary | ICD-10-CM

## 2010-02-28 DIAGNOSIS — C189 Malignant neoplasm of colon, unspecified: Secondary | ICD-10-CM

## 2010-03-20 ENCOUNTER — Other Ambulatory Visit (HOSPITAL_COMMUNITY): Payer: MEDICARE

## 2010-03-20 ENCOUNTER — Encounter (HOSPITAL_COMMUNITY): Payer: Medicare Other | Attending: Oncology

## 2010-03-20 DIAGNOSIS — Z7901 Long term (current) use of anticoagulants: Secondary | ICD-10-CM | POA: Insufficient documentation

## 2010-03-20 DIAGNOSIS — I82409 Acute embolism and thrombosis of unspecified deep veins of unspecified lower extremity: Secondary | ICD-10-CM

## 2010-03-20 DIAGNOSIS — Z86718 Personal history of other venous thrombosis and embolism: Secondary | ICD-10-CM | POA: Insufficient documentation

## 2010-03-20 DIAGNOSIS — Z85038 Personal history of other malignant neoplasm of large intestine: Secondary | ICD-10-CM | POA: Insufficient documentation

## 2010-03-20 DIAGNOSIS — D6859 Other primary thrombophilia: Secondary | ICD-10-CM | POA: Insufficient documentation

## 2010-03-27 LAB — PROTIME-INR
INR: 2.81 — ABNORMAL HIGH (ref 0.00–1.49)
Prothrombin Time: 29.7 seconds — ABNORMAL HIGH (ref 11.6–15.2)

## 2010-03-28 LAB — PROTIME-INR
INR: 2.26 — ABNORMAL HIGH (ref 0.00–1.49)
INR: 2.57 — ABNORMAL HIGH (ref 0.00–1.49)
Prothrombin Time: 25.1 seconds — ABNORMAL HIGH (ref 11.6–15.2)

## 2010-03-29 LAB — RETICULOCYTES
RBC.: 4.53 MIL/uL (ref 4.22–5.81)
Retic Count, Absolute: 72.5 10*3/uL (ref 19.0–186.0)
Retic Ct Pct: 1.6 % (ref 0.4–3.1)

## 2010-03-29 LAB — PROTIME-INR
INR: 2.5 — ABNORMAL HIGH (ref 0.00–1.49)
INR: 3.6 — ABNORMAL HIGH (ref 0.00–1.49)
Prothrombin Time: 27.1 seconds — ABNORMAL HIGH (ref 11.6–15.2)
Prothrombin Time: 35.9 seconds — ABNORMAL HIGH (ref 11.6–15.2)
Prothrombin Time: 25.2 seconds — ABNORMAL HIGH (ref 11.6–15.2)

## 2010-03-29 LAB — CBC
HCT: 39 % (ref 39.0–52.0)
MCV: 90 fL (ref 78.0–100.0)
RDW: 14.8 % (ref 11.5–15.5)
WBC: 7.7 10*3/uL (ref 4.0–10.5)

## 2010-03-29 LAB — DIFFERENTIAL
Basophils Relative: 0 % (ref 0–1)
Eosinophils Absolute: 0.4 10*3/uL (ref 0.0–0.7)
Lymphs Abs: 1.6 10*3/uL (ref 0.7–4.0)
Neutrophils Relative %: 65 % (ref 43–77)

## 2010-03-29 LAB — COMPREHENSIVE METABOLIC PANEL
ALT: 9 U/L (ref 0–53)
Alkaline Phosphatase: 44 U/L (ref 39–117)
CO2: 29 mEq/L (ref 19–32)
Calcium: 8.9 mg/dL (ref 8.4–10.5)
GFR calc non Af Amer: 50 mL/min — ABNORMAL LOW (ref >60)
Glucose, Bld: 141 mg/dL — ABNORMAL HIGH (ref 70–99)
Sodium: 140 mEq/L (ref 135–145)

## 2010-03-29 LAB — SEDIMENTATION RATE: Sed Rate: 44 mm/hr — ABNORMAL HIGH (ref 0–16)

## 2010-03-29 LAB — WEST NILE ANTIBODIES, IGG AND IGM
West Nile IgG: <1.3 index (ref <1.30)
West Nile Virus, IgM: <0.9 index (ref <0.90)

## 2010-03-29 LAB — VITAMIN B12: Vitamin B-12: 396 pg/mL (ref 211–911)

## 2010-03-30 ENCOUNTER — Encounter: Payer: Self-pay | Admitting: Internal Medicine

## 2010-03-30 ENCOUNTER — Encounter (INDEPENDENT_AMBULATORY_CARE_PROVIDER_SITE_OTHER): Payer: Medicare Other

## 2010-03-30 DIAGNOSIS — I495 Sick sinus syndrome: Secondary | ICD-10-CM

## 2010-03-30 LAB — PROTIME-INR
INR: 1.35 (ref 0.00–1.49)
INR: 1.46 (ref 0.00–1.49)
INR: 1.54 — ABNORMAL HIGH (ref 0.00–1.49)
INR: 2.32 — ABNORMAL HIGH (ref 0.00–1.49)
Prothrombin Time: 15.3 seconds — ABNORMAL HIGH (ref 11.6–15.2)
Prothrombin Time: 17.9 seconds — ABNORMAL HIGH (ref 11.6–15.2)
Prothrombin Time: 18.7 seconds — ABNORMAL HIGH (ref 11.6–15.2)
Prothrombin Time: 22.2 seconds — ABNORMAL HIGH (ref 11.6–15.2)
Prothrombin Time: 25.6 seconds — ABNORMAL HIGH (ref 11.6–15.2)

## 2010-03-30 LAB — CBC
HCT: 39 % (ref 39.0–52.0)
HCT: 42.2 % (ref 39.0–52.0)
Hemoglobin: 14 g/dL (ref 13.0–17.0)
MCH: 30.4 pg (ref 26.0–34.0)
MCH: 30.8 pg (ref 26.0–34.0)
MCH: 29.9 pg (ref 26.0–34.0)
MCHC: 33.6 g/dL (ref 30.0–36.0)
MCHC: 33.9 g/dL (ref 30.0–36.0)
MCV: 90 fL (ref 78.0–100.0)
Platelets: 208 10*3/uL (ref 150–400)
RBC: 4.68 MIL/uL (ref 4.22–5.81)
RDW: 15.4 % (ref 11.5–15.5)
RDW: 15.6 % — ABNORMAL HIGH (ref 11.5–15.5)
WBC: 5.9 10*3/uL (ref 4.0–10.5)

## 2010-03-30 LAB — DIFFERENTIAL
Basophils Absolute: 0 10*3/uL (ref 0.0–0.1)
Basophils Absolute: 0 10*3/uL (ref 0.0–0.1)
Basophils Relative: 0 % (ref 0–1)
Basophils Relative: 0 % (ref 0–1)
Basophils Relative: 1 % (ref 0–1)
Eosinophils Absolute: 0.4 10*3/uL (ref 0.0–0.7)
Eosinophils Absolute: 0.5 10*3/uL (ref 0.0–0.7)
Eosinophils Absolute: 0.4 10*3/uL (ref 0.0–0.7)
Eosinophils Relative: 6 % — ABNORMAL HIGH (ref 0–5)
Eosinophils Relative: 7 % — ABNORMAL HIGH (ref 0–5)
Lymphocytes Relative: 14 % (ref 12–46)
Lymphs Abs: 0.9 10*3/uL (ref 0.7–4.0)
Monocytes Absolute: 0.6 10*3/uL (ref 0.1–1.0)
Monocytes Relative: 10 % (ref 3–12)
Monocytes Relative: 12 % (ref 3–12)
Monocytes Relative: 9 % (ref 3–12)
Neutro Abs: 4.6 10*3/uL (ref 1.7–7.7)
Neutrophils Relative %: 70 % (ref 43–77)
Neutrophils Relative %: 74 % (ref 43–77)
Neutrophils Relative %: 63 % (ref 43–77)

## 2010-03-30 LAB — URINALYSIS, ROUTINE W REFLEX MICROSCOPIC
Bilirubin Urine: NEGATIVE
Bilirubin Urine: NEGATIVE
Hgb urine dipstick: NEGATIVE
Ketones, ur: NEGATIVE mg/dL
Nitrite: NEGATIVE
Nitrite: NEGATIVE
Specific Gravity, Urine: 1.015 (ref 1.005–1.030)
Urobilinogen, UA: 0.2 mg/dL (ref 0.0–1.0)
Urobilinogen, UA: 0.2 mg/dL (ref 0.0–1.0)
pH: 6 (ref 5.0–8.0)

## 2010-03-30 LAB — COMPREHENSIVE METABOLIC PANEL
ALT: 11 U/L (ref 0–53)
ALT: <8 U/L (ref 0–53)
AST: 15 U/L (ref 0–37)
Albumin: 3 g/dL — ABNORMAL LOW (ref 3.5–5.2)
Alkaline Phosphatase: 55 U/L (ref 39–117)
BUN: 15 mg/dL (ref 6–23)
CO2: 28 mEq/L (ref 19–32)
Calcium: 8.7 mg/dL (ref 8.4–10.5)
Creatinine, Ser: 1.48 mg/dL (ref 0.4–1.5)
GFR calc Af Amer: 55 mL/min — ABNORMAL LOW (ref >60)
GFR calc non Af Amer: 45 mL/min — ABNORMAL LOW (ref >60)
GFR calc non Af Amer: 48 mL/min — ABNORMAL LOW (ref >60)
Glucose, Bld: 79 mg/dL (ref 70–99)
Potassium: 3.7 mEq/L (ref 3.5–5.1)
Sodium: 140 mEq/L (ref 135–145)
Sodium: 138 mEq/L (ref 135–145)
Total Bilirubin: 0.6 mg/dL (ref 0.3–1.2)
Total Protein: 6 g/dL (ref 6.0–8.3)

## 2010-03-30 LAB — BASIC METABOLIC PANEL
BUN: 18 mg/dL (ref 6–23)
Calcium: 8.1 mg/dL — ABNORMAL LOW (ref 8.4–10.5)
GFR calc non Af Amer: 52 mL/min — ABNORMAL LOW (ref >60)
Glucose, Bld: 109 mg/dL — ABNORMAL HIGH (ref 70–99)
Potassium: 3.9 mEq/L (ref 3.5–5.1)

## 2010-03-30 LAB — CEA: CEA: 3.8 ng/mL (ref 0.0–5.0)

## 2010-04-01 LAB — CBC
MCHC: 34 g/dL (ref 30.0–36.0)
MCV: 90.4 fL (ref 78.0–100.0)
RBC: 4.24 MIL/uL (ref 4.22–5.81)
RDW: 14.1 % (ref 11.5–15.5)

## 2010-04-01 LAB — DIFFERENTIAL
Basophils Relative: 0 % (ref 0–1)
Eosinophils Absolute: 0.5 10*3/uL (ref 0.0–0.7)
Monocytes Absolute: 0.8 10*3/uL (ref 0.1–1.0)
Monocytes Relative: 16 % — ABNORMAL HIGH (ref 3–12)
Neutrophils Relative %: 53 % (ref 43–77)

## 2010-04-01 LAB — BASIC METABOLIC PANEL
CO2: 28 mEq/L (ref 19–32)
Chloride: 104 mEq/L (ref 96–112)
Creatinine, Ser: 1.15 mg/dL (ref 0.4–1.5)
GFR calc Af Amer: >60 mL/min (ref >60)
Glucose, Bld: 96 mg/dL (ref 70–99)

## 2010-04-01 LAB — PROTIME-INR
INR: 1.33 (ref 0.00–1.49)
INR: 1.77 — ABNORMAL HIGH (ref 0.00–1.49)
Prothrombin Time: 18.8 seconds — ABNORMAL HIGH (ref 11.6–15.2)
Prothrombin Time: 20 seconds — ABNORMAL HIGH (ref 11.6–15.2)
Prothrombin Time: 20.5 seconds — ABNORMAL HIGH (ref 11.6–15.2)

## 2010-04-01 LAB — APTT: aPTT: 43 seconds — ABNORMAL HIGH (ref 24–37)

## 2010-04-02 LAB — PROTIME-INR
INR: 1.84 — ABNORMAL HIGH (ref 0.00–1.49)
INR: 2.52 — ABNORMAL HIGH (ref 0.00–1.49)
Prothrombin Time: 21.1 seconds — ABNORMAL HIGH (ref 11.6–15.2)
Prothrombin Time: 24.6 seconds — ABNORMAL HIGH (ref 11.6–15.2)

## 2010-04-04 LAB — PROTIME-INR
INR: 2.79 — ABNORMAL HIGH (ref 0.00–1.49)
Prothrombin Time: 29.9 seconds — ABNORMAL HIGH (ref 11.6–15.2)

## 2010-04-05 LAB — PROTIME-INR
INR: 1.53 — ABNORMAL HIGH (ref 0.00–1.49)
INR: 2.26 — ABNORMAL HIGH (ref 0.00–1.49)
INR: 2.31 — ABNORMAL HIGH (ref 0.00–1.49)
INR: 2.33 — ABNORMAL HIGH (ref 0.00–1.49)
Prothrombin Time: 18.3 seconds — ABNORMAL HIGH (ref 11.6–15.2)
Prothrombin Time: 24.8 seconds — ABNORMAL HIGH (ref 11.6–15.2)
Prothrombin Time: 27.4 seconds — ABNORMAL HIGH (ref 11.6–15.2)
Prothrombin Time: 25.4 seconds — ABNORMAL HIGH (ref 11.6–15.2)

## 2010-04-07 LAB — PROTIME-INR
INR: 2.11 — ABNORMAL HIGH (ref 0.00–1.49)
Prothrombin Time: 23.5 seconds — ABNORMAL HIGH (ref 11.6–15.2)
Prothrombin Time: 24.5 seconds — ABNORMAL HIGH (ref 11.6–15.2)

## 2010-04-10 ENCOUNTER — Encounter (HOSPITAL_COMMUNITY): Payer: Medicare Other

## 2010-04-10 ENCOUNTER — Other Ambulatory Visit (HOSPITAL_COMMUNITY): Payer: Medicare Other

## 2010-04-10 ENCOUNTER — Other Ambulatory Visit (HOSPITAL_COMMUNITY): Payer: Self-pay | Admitting: Oncology

## 2010-04-10 DIAGNOSIS — Z7901 Long term (current) use of anticoagulants: Secondary | ICD-10-CM

## 2010-04-10 DIAGNOSIS — I82409 Acute embolism and thrombosis of unspecified deep veins of unspecified lower extremity: Secondary | ICD-10-CM

## 2010-04-10 LAB — PROTIME-INR
INR: 1.57 — ABNORMAL HIGH (ref 0.00–1.49)
Prothrombin Time: 19 seconds — ABNORMAL HIGH (ref 11.6–15.2)

## 2010-04-16 ENCOUNTER — Encounter: Payer: Self-pay | Admitting: *Deleted

## 2010-04-17 ENCOUNTER — Other Ambulatory Visit (HOSPITAL_COMMUNITY): Payer: Self-pay | Admitting: Oncology

## 2010-04-17 ENCOUNTER — Encounter (HOSPITAL_COMMUNITY): Payer: Medicare Other | Attending: Oncology

## 2010-04-17 ENCOUNTER — Encounter (HOSPITAL_COMMUNITY): Payer: Medicare Other

## 2010-04-17 DIAGNOSIS — D6859 Other primary thrombophilia: Secondary | ICD-10-CM | POA: Insufficient documentation

## 2010-04-17 DIAGNOSIS — Z7901 Long term (current) use of anticoagulants: Secondary | ICD-10-CM | POA: Insufficient documentation

## 2010-04-17 DIAGNOSIS — Z85038 Personal history of other malignant neoplasm of large intestine: Secondary | ICD-10-CM

## 2010-04-17 DIAGNOSIS — Z86718 Personal history of other venous thrombosis and embolism: Secondary | ICD-10-CM | POA: Insufficient documentation

## 2010-04-17 LAB — BASIC METABOLIC PANEL
BUN: 11 mg/dL (ref 6–23)
Calcium: 9.4 mg/dL (ref 8.4–10.5)
Creatinine, Ser: 1.36 mg/dL (ref 0.4–1.5)
GFR calc Af Amer: >60 mL/min (ref >60)
GFR calc non Af Amer: 50 mL/min — ABNORMAL LOW (ref >60)
Glucose, Bld: 116 mg/dL — ABNORMAL HIGH (ref 70–99)
Sodium: 138 mEq/L (ref 135–145)

## 2010-04-17 LAB — COMPREHENSIVE METABOLIC PANEL
Albumin: 3.5 g/dL (ref 3.5–5.2)
Alkaline Phosphatase: 48 U/L (ref 39–117)
Alkaline Phosphatase: 52 U/L (ref 39–117)
BUN: 15 mg/dL (ref 6–23)
BUN: 9 mg/dL (ref 6–23)
CO2: 24 mEq/L (ref 19–32)
Chloride: 104 mEq/L (ref 96–112)
Chloride: 106 mEq/L (ref 96–112)
Creatinine, Ser: 1.28 mg/dL (ref 0.4–1.5)
Creatinine, Ser: 1.45 mg/dL (ref 0.4–1.5)
GFR calc Af Amer: 56 mL/min — ABNORMAL LOW (ref >60)
GFR calc non Af Amer: 46 mL/min — ABNORMAL LOW (ref >60)
GFR calc non Af Amer: 54 mL/min — ABNORMAL LOW (ref >60)
Glucose, Bld: 143 mg/dL — ABNORMAL HIGH (ref 70–99)
Glucose, Bld: 95 mg/dL (ref 70–99)
Potassium: 3.4 mEq/L — ABNORMAL LOW (ref 3.5–5.1)
Potassium: 3.5 mEq/L (ref 3.5–5.1)
Total Bilirubin: 0.5 mg/dL (ref 0.3–1.2)
Total Bilirubin: 0.7 mg/dL (ref 0.3–1.2)
Total Protein: 7 g/dL (ref 6.0–8.3)

## 2010-04-17 LAB — CBC
HCT: 40.5 % (ref 39.0–52.0)
Hemoglobin: 13.7 g/dL (ref 13.0–17.0)
Hemoglobin: 14.3 g/dL (ref 13.0–17.0)
MCHC: 33.4 g/dL (ref 30.0–36.0)
MCV: 92 fL (ref 78.0–100.0)
Platelets: 243 10*3/uL (ref 150–400)
RBC: 4.41 MIL/uL (ref 4.22–5.81)
RBC: 4.65 MIL/uL (ref 4.22–5.81)
RDW: 13.9 % (ref 11.5–15.5)
RDW: 14.3 % (ref 11.5–15.5)
WBC: 5.2 10*3/uL (ref 4.0–10.5)
WBC: 6.7 10*3/uL (ref 4.0–10.5)

## 2010-04-17 LAB — DIFFERENTIAL
Basophils Absolute: 0 10*3/uL (ref 0.0–0.1)
Basophils Absolute: 0 10*3/uL (ref 0.0–0.1)
Basophils Absolute: 0 10*3/uL (ref 0.0–0.1)
Basophils Relative: 0 % (ref 0–1)
Basophils Relative: 0 % (ref 0–1)
Eosinophils Absolute: 0.1 10*3/uL (ref 0.0–0.7)
Eosinophils Relative: 6 % — ABNORMAL HIGH (ref 0–5)
Lymphocytes Relative: 17 % (ref 12–46)
Lymphocytes Relative: 19 % (ref 12–46)
Lymphocytes Relative: 8 % — ABNORMAL LOW (ref 12–46)
Monocytes Absolute: 0.7 10*3/uL (ref 0.1–1.0)
Monocytes Absolute: 0.7 10*3/uL (ref 0.1–1.0)
Monocytes Relative: 11 % (ref 3–12)
Monocytes Relative: 8 % (ref 3–12)
Neutro Abs: 3.3 10*3/uL (ref 1.7–7.7)
Neutro Abs: 4.7 10*3/uL (ref 1.7–7.7)
Neutrophils Relative %: 83 % — ABNORMAL HIGH (ref 43–77)

## 2010-04-17 LAB — PROTIME-INR
INR: 2.36 — ABNORMAL HIGH (ref 0.00–1.49)
INR: 2.25 — ABNORMAL HIGH (ref 0.00–1.49)
INR: 2.55 — ABNORMAL HIGH (ref 0.00–1.49)
INR: 2.67 — ABNORMAL HIGH (ref 0.00–1.49)
Prothrombin Time: 25.9 seconds — ABNORMAL HIGH (ref 11.6–15.2)
Prothrombin Time: 24.7 seconds — ABNORMAL HIGH (ref 11.6–15.2)
Prothrombin Time: 27.2 seconds — ABNORMAL HIGH (ref 11.6–15.2)

## 2010-04-17 LAB — POCT CARDIAC MARKERS
CKMB, poc: <1 ng/mL — ABNORMAL LOW (ref 1.0–8.0)
Myoglobin, poc: 194 ng/mL (ref 12–200)
Troponin i, poc: <0.05 ng/mL (ref 0.00–0.09)

## 2010-04-17 LAB — LIPASE, BLOOD: Lipase: 20 U/L (ref 11–59)

## 2010-04-18 LAB — PROTIME-INR: Prothrombin Time: 20.9 seconds — ABNORMAL HIGH (ref 11.6–15.2)

## 2010-04-19 LAB — DIFFERENTIAL
Basophils Absolute: 0 10*3/uL (ref 0.0–0.1)
Basophils Relative: 0 % (ref 0–1)
Eosinophils Absolute: 0.4 10*3/uL (ref 0.0–0.7)
Eosinophils Relative: 8 % — ABNORMAL HIGH (ref 0–5)
Monocytes Absolute: 0.5 10*3/uL (ref 0.1–1.0)
Neutro Abs: 3.3 10*3/uL (ref 1.7–7.7)

## 2010-04-19 LAB — CBC
HCT: 41.5 % (ref 39.0–52.0)
Hemoglobin: 14.1 g/dL (ref 13.0–17.0)
MCHC: 34 g/dL (ref 30.0–36.0)
Platelets: 224 10*3/uL (ref 150–400)
RDW: 15 % (ref 11.5–15.5)

## 2010-04-19 LAB — COMPREHENSIVE METABOLIC PANEL
CO2: 29 mEq/L (ref 19–32)
Calcium: 8.8 mg/dL (ref 8.4–10.5)
Creatinine, Ser: 1.28 mg/dL (ref 0.4–1.5)
GFR calc Af Amer: >60 mL/min (ref >60)
Glucose, Bld: 88 mg/dL (ref 70–99)
Sodium: 140 mEq/L (ref 135–145)
Total Bilirubin: 0.5 mg/dL (ref 0.3–1.2)
Total Protein: 6.7 g/dL (ref 6.0–8.3)

## 2010-04-19 LAB — PROTIME-INR
INR: 3.12 — ABNORMAL HIGH (ref 0.00–1.49)
Prothrombin Time: 31.9 seconds — ABNORMAL HIGH (ref 11.6–15.2)

## 2010-04-20 LAB — CLOSTRIDIUM DIFFICILE EIA: C difficile Toxins A+B, EIA: NEGATIVE

## 2010-04-20 LAB — OVA AND PARASITE EXAMINATION

## 2010-04-20 LAB — STOOL CULTURE

## 2010-04-21 LAB — PROTIME-INR
INR: 2.4 — ABNORMAL HIGH (ref 0.00–1.49)
INR: 1.8 — ABNORMAL HIGH (ref 0.00–1.49)
Prothrombin Time: 25.6 seconds — ABNORMAL HIGH (ref 11.6–15.2)
Prothrombin Time: 38.2 seconds — ABNORMAL HIGH (ref 11.6–15.2)

## 2010-04-22 LAB — PROTIME-INR: INR: 2.2 — ABNORMAL HIGH (ref 0.00–1.49)

## 2010-04-23 LAB — PROTIME-INR
INR: 2 — ABNORMAL HIGH (ref 0.00–1.49)
Prothrombin Time: 23.6 seconds — ABNORMAL HIGH (ref 11.6–15.2)

## 2010-04-24 LAB — PROTIME-INR
INR: 2.3 — ABNORMAL HIGH (ref 0.00–1.49)
INR: 1.4 (ref 0.00–1.49)
Prothrombin Time: 17.9 seconds — ABNORMAL HIGH (ref 11.6–15.2)

## 2010-04-25 LAB — PROTIME-INR
INR: 1.6 — ABNORMAL HIGH (ref 0.00–1.49)
INR: 1.2 (ref 0.00–1.49)
Prothrombin Time: 15.8 seconds — ABNORMAL HIGH (ref 11.6–15.2)

## 2010-04-25 LAB — APTT: aPTT: 37 seconds (ref 24–37)

## 2010-04-25 LAB — CBC
HCT: 38.4 % — ABNORMAL LOW (ref 39.0–52.0)
Hemoglobin: 13.1 g/dL (ref 13.0–17.0)
MCHC: 34 g/dL (ref 30.0–36.0)
MCV: 90.8 fL (ref 78.0–100.0)
RBC: 4.23 MIL/uL (ref 4.22–5.81)
WBC: 6.3 10*3/uL (ref 4.0–10.5)

## 2010-04-25 LAB — DIFFERENTIAL
Basophils Relative: 0 % (ref 0–1)
Eosinophils Absolute: 0.6 10*3/uL (ref 0.0–0.7)
Lymphs Abs: 1.2 10*3/uL (ref 0.7–4.0)
Monocytes Absolute: 0.5 10*3/uL (ref 0.1–1.0)
Monocytes Relative: 9 % (ref 3–12)
Neutrophils Relative %: 63 % (ref 43–77)

## 2010-04-25 LAB — VITAMIN B12: Vitamin B-12: 1402 pg/mL — ABNORMAL HIGH (ref 211–911)

## 2010-04-26 LAB — PROTIME-INR: INR: 1.9 — ABNORMAL HIGH (ref 0.00–1.49)

## 2010-05-01 LAB — PROTIME-INR
INR: 2.3 — ABNORMAL HIGH (ref 0.00–1.49)
Prothrombin Time: 26.3 seconds — ABNORMAL HIGH (ref 11.6–15.2)

## 2010-05-02 LAB — PROTIME-INR: INR: 2.2 — ABNORMAL HIGH (ref 0.00–1.49)

## 2010-05-08 ENCOUNTER — Other Ambulatory Visit (HOSPITAL_COMMUNITY): Payer: Medicare Other

## 2010-05-15 ENCOUNTER — Encounter (HOSPITAL_BASED_OUTPATIENT_CLINIC_OR_DEPARTMENT_OTHER): Payer: Medicare Other

## 2010-05-15 DIAGNOSIS — I82409 Acute embolism and thrombosis of unspecified deep veins of unspecified lower extremity: Secondary | ICD-10-CM

## 2010-05-30 NOTE — Op Note (Signed)
NAME:  Justin Lang, Justin Lang               ACCOUNT NO.:  [id number]   MEDICAL RECORD NO.:  [id number]          PATIENT TYPE:  AMB   LOCATION:  DAY                           FACILITY:  [facility]   PHYSICIAN:  R. Roetta Sessions, M.D. DATE OF BIRTH:  08-09-1922   DATE OF PROCEDURE:  12/29/2007  DATE OF DISCHARGE:                               OPERATIVE REPORT   PROCEDURE:  Colonoscopy with biopsy stool sampling.   INDICATIONS FOR PROCEDURE:  An 77 year old gentleman with history of  metastatic recurrent colon cancer with a 65-month history of nonbloody  diarrhea.  He has been on some WelChol per Dr. Jen Mow with modest  improvement in his symptoms.  Colonoscopy is now being done to further  evaluate this gentleman for possible microscopic colitis as other causes  of diarrhea.  He has had multiple laparotomies for small bowel  obstruction.  This approach has been discussed with the patient at  length previously, and again at the bedside risks, benefits, and  alternatives have been reviewed.  Please see the documentation of the  medical record.   PROCEDURE NOTE:  O2 saturation, blood pressure, pulse, respiration were  monitored throughout the entire procedure.   CONSCIOUS SEDATION:  Versed 2 mg IV and Demerol 50 mg IV in a single  dose.   INSTRUMENT:  Pentax video chip system.   FINDINGS:  Digital rectal exam revealed no abnormalities.   ENDOSCOPIC FINDINGS:  The prep was adequate.  Colon:  Colonic mucosa was  surveyed from the rectosigmoid junction to the anastomosis with small  bowel.  The surgical anastomosis appeared normal.  There was no cecum,  ileocecal valve, or appendiceal orifice.  The small bowel was intubated  to good 20 cm.  The segment GI tract appeared entirely normal.  The  scope was pulled back down the colon and all previously-mentioned  mucosal surfaces were again seen.  The patient had densely populated  colonic diverticula in the residual colon.  His colon was significantly  foreshortened.  I suspect he has had extensive colon resection.  However, aside from diverticula, the colonic mucosa appeared entirely  normal.  Two small cold biopsies were taken to rule out microscopic  colitis.  These were done without difficulty and with minimal bleeding.  The scope was pulled down the rectum with thorough examination of the  rectal mucosa including retroflexed view of the anal verge demonstrated  no abnormalities.  The patient tolerated the procedure well.  It was a  reactive endoscopy.   IMPRESSION:  1. Normal rectum.  2. Extensive colonic diverticula with a significantly foreshortened      colon status post colonic resection previously.  Normal      anastomosis, normal distal small bowel, no evidence of recurrent      neoplasia or polyp status post biopsy and stool sampling.   RECOMMENDATIONS:  We will follow up on pending studies and make further  suggestions in the very near future as the management of his diarrhea.      Jonathon Bellows, M.D.  Electronically Signed     RMR/MEDQ  D:  12/29/2007  T:  12/30/2007  Job:  [id number]   cc:   Erle Crocker, M.D.

## 2010-05-30 NOTE — Consult Note (Signed)
NAME:  Justin Lang, Justin Lang               ACCOUNT NO.:  [id number]   MEDICAL RECORD NO.:  [id number]          PATIENT TYPE:  AMB   LOCATION:  DAY                           FACILITY:  [facility]   PHYSICIAN:  R. Roetta Sessions, M.D. DATE OF BIRTH:  1922/04/15   DATE OF CONSULTATION:  DATE OF DISCHARGE:                                 CONSULTATION   REQUESTING PHYSICIAN:  Dr. Jen Mow   REASON FOR CONSULTATION:  Diarrhea.   HISTORY OF PRESENT ILLNESS:  Justin Lang is an 77 year old Caucasian  male.  He has history of colon cancer.  Apparently, he was initially  diagnosed in 1997.  He was unable to tolerate chemotherapy.  Apparently,  he had a colectomy.  He had history of metastatic recurrence and  resection at [facility] in November 2002.  He notes  over the last 6 months, he has had diarrhea and is pretty much every  day.  He has anywhere from 5-10 loose stools per day.  He has been  taking a cholesterol medicine once or twice daily.  When he does use  it, it seems to be helpful for his diarrhea.  He previously up until now  has not had to take anything for diarrhea and was quite satisfied with  his bowel movements.  He denies any rectal bleeding, melena, or mucus in  his stools.  He has been on chronic Coumadin for years for DVT and  pulmonary emboli.  He also has a Greenfield filter.  He tells me he has  had normal PET scans annually.  He has some mild migratory abdominal  pain and notes high-pitched bowel sounds.  His appetite has decreased  somewhat, although his weight has remained stable.  He denies any fever  or chills.  Denies any recent antibiotic use.  Denies any foreign travel  or new medications.  He occasionally has transient nausea, but denies  any vomiting.  Denies any dysphagia or odynophagia or early satiety.   PAST MEDICAL AND SURGICAL HISTORY:  1. Metastatic recurrent colon cancer status post chemotherapy      intolerance, radiation, and resection at [facility],  December 10, 2000,      initially diagnosed in 1997.  2. Small bowel obstruction with surgical repair, Dr. Joselyn Glassman in April      2003.  3. History of herpes zoster, L5-S1 nerve root distribution.  4. History of DVT and pulmonary emboli, life long Coumadin.  5. Factor V Leiden deficiency per the patient; however, there is no      mention of this in Dr. Thornton Papas note.  6. Renal lithiasis.  7. Degenerative joint disease.  8. Diverticulitis previously.  9. Bilateral knee replacements.  10.Peripheral neuropathy.  11.Pernicious anemia.  12.Greenfield filter.   CURRENT MEDICATIONS:  Coumadin 7.5 mg Monday through Friday, and 6.5 mg  Saturday and Sunday.  He use the Metanx once daily.  He was previously  on B12 injections.  WelChol (or some type of cholesterol medicine) as  needed for diarrhea.   ALLERGIES:  CELEBREX.   FAMILY HISTORY:  There is no known family history  of colorectal  carcinoma or chronic GI problems.  Mother deceased at 45, secondary to  old age.  Father deceased with history of Parkinson in his mid 50s.  He  has one healthy sister, lost one sister to breast cancer, one brother to  alcoholism.   SOCIAL HISTORY:  [name] has been in the second marriage for 37  years.  He has six relatively healthy children.  He is retired Investment banker, operational.  He denies any tobacco, alcohol, or drug use.   REVIEW OF SYSTEMS:  See HPI, otherwise negative.   PHYSICAL EXAMINATION:  VITAL SIGNS:  Weight 179 pounds, height 69  inches, temp 97.7, blood pressure 130/78, pulse 64.  GENERAL:  He is a pleasant, cooperative elderly Caucasian male in no  acute distress.  HEENT:  Sclerae clear, nonicteric.  Conjunctivae pink.  Oropharynx pink  and moist without any lesions.  NECK:  Supple without mass or thyromegaly.  CHEST:  Heart regular rhythm.  Normal S1, S2 without murmurs, clicks,  rubs, or gallops.  LUNGS:  Clear to auscultation bilaterally.  ABDOMEN:  Positive bowel sounds  x4.  No bruits auscultated.  Soft,  nontender, and nondistended.  He does have firmness and nodularity to  his upper abdomen and left upper quadrant around previous scars which  are well healed.  There is no rebound, tenderness, or guarding.  No  hepatosplenomegaly or mass.  EXTREMITIES:  Without edema.   IMPRESSION:  Justin Lang is an 77 year old Caucasian male with history of  metastatic recurrent colon cancer who has had a 11-month history of  diarrhea.  He does seem to have some response to cholesterol medicine.  He is going to require further evaluation to rule out recurrent colon  cancer, microscopic colitis, or short gut syndrome.   PLAN:  Colonoscopy with Dr. Jena Gauss in the near future.  I have discussed  this procedure with both Justin Lang and Dr. Jena Gauss.  He may need to have  random biopsies as well.  Discussed the procedure including risks and  benefits to include, but not limited to bleeding, infection,  perforation, or drug reaction.  He agrees with the plan and consent will  be obtained.  This procedure will be done on Coumadin in risk for  explained thoroughly.  He can continue on his Welchol or Questran as  directed.  We may need to titrate the dose and hopefully he will bring  this medicine with him to endoscopy.      Lorenza Burton, N.P.      Jonathon Bellows, M.D.  Electronically Signed    KJ/MEDQ  D:  12/10/2007  T:  12/11/2007  Job:  [id number]   cc:   Erle Crocker, M.D.

## 2010-05-30 NOTE — H&P (Signed)
NAME:  Justin Lang, Justin Lang               ACCOUNT NO.:  [id number]   MEDICAL RECORD NO.:  [id number]          PATIENT TYPE:  OIB   LOCATION:                               FACILITY:  [facility]   PHYSICIAN:  Colleen Can. Deborah Chalk, M.D.DATE OF BIRTH:  [birth year]   DATE OF ADMISSION:  05/19/2008  DATE OF DISCHARGE:                              HISTORY & PHYSICAL   CHIEF COMPLAINT:  Syncope.   HISTORY OF PRESENT ILLNESS:  Mr. Beswick is a pleasant 77 year old white  male who is referred for pacemaker implantation.  He has a very  complicated past medical history.  He was referred to our office for  evaluation of syncope.  He has had a Holter monitor which showed  episodes of profound bradycardia with recurrent pauses.  He had heart  rates down into the low 20s.  He is now referred for pacemaker  implantation.   PAST MEDICAL HISTORY:  1. Syncope.  2. Chronic diarrhea.  3. Metastatic colon cancer.  4. Status post sigmoid colectomy in 1997, followed by exploratory lap      and removal of the metastatic retroperitoneal lymph node in 2002.  5. Factor V deficiency.  6. History of nephrolithiasis.  7. Deep venous thrombosis.  8. Pulmonary embolus.  9. Incisional hernia.  10.Status post Greenfield filter placed in 1998.  11.Chronic Coumadin therapy.  12.Recurrent small-bowel obstructions.  13.Degenerative joint disease of the left ankle.  14.Multiple disk operations.  15.Bilateral carotid sensitivity causing syncope and hypertension from      ages [age].  16.Bilateral knee replacements.  17.History of headaches.  18.Question of B12 absorption.  19.Weight loss.   ALLERGIES:  None.   MEDICINES:  1. Coumadin; currently taking 7.5 mg 5 days a week and 6.5 mg 2 days.  2. Vitamin B12 supplement 2 tablets daily.   FAMILY HISTORY:  His father died at 61 of Parkinson disease.  Mother  died at age 33 after a fall and injury.   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  He has never used tobacco.  He does not use alcohol.  He lives at home with his wife.  He has 3 sons and 3 daughters.   REVIEW OF SYSTEMS:  He has had syncope, especially over the past several  weeks, it is always while standing.  He does feel lightheaded and weak,  and is really not aware of any palpitations.  Some of these episodes  will be presyncopal and that if he can sit down, he would be able to  recover.  He has had one episode while he was working with his cattle  with a International aid/development worker where he passed out completely and lost all postural  tone.  He does have chronic diarrhea, which limits his activities for  the most part.  He has not had any chest pain per se.  He has never had  a cardiac event.  He does not have diabetes.  He has had no complaints  of seizure or strokes.  He does not have any voiding symptoms, and  generally sleeps  through the night without problems.  He does not have  sleep apnea.  He has not been depressed.  He enjoys getting out and  being active with his cattle.  He does remain quite active in an overall  fashion.  All other review of systems are negative.   PHYSICAL EXAMINATION:  GENERAL:  He is very pleasant and friendly 70-  year-old male who appears younger than his stated age.  VITAL SIGNS:  His weight is 174 pounds, blood pressure was 118/70  sitting and 120/70 standing, heart rate is 60 and regular.  He is  afebrile.  SKIN:  Warm and dry.  Color is unremarkable.  HEENT:  Normocephalic and atraumatic.  Pupils are equal, round, and  reactive to light.  Sclerae are nonicteric.  Pupils were somewhat  constricted.  Oropharynx is clear.  NECK:  Supple.  No bruits.  No thyromegaly and no masses.  LUNGS:  Clear to auscultate.  CARDIAC:  No murmur.  No gallop.  He was in a regular rhythm.  ABDOMEN:  Soft with multiple scars noted, yet soft and nontender.  MUSCULOSKELETAL:  Strength is symmetric.  Gait and range of motion are  intact.   EXTREMITIES:  No evidence of edema.  NEUROLOGIC:  No gross focal deficits.   PERTINENT LABORATORIES:  Pending.   OVERALL IMPRESSION:  1. Documented profound bradycardia.  2. Syncope.  3. History of metastatic colon cancer.  4. History of deep venous thrombosis and pulmonary emboli with a      Greenfield filter in place.  5. Chronic Coumadin therapy.  6. Question of a B12 deficiency.  7. Chronic diarrhea.   PLAN:  We will proceed on with pacemaker implantation.  Procedure has  been reviewed in full detail and he is willing to proceed on Wednesday,  May 19, 2008.  His Coumadin will be held on the Sunday, Monday, and  Tuesday prior to the procedure.  He will have Lovenox 80 mg b.i.d.  starting on Monday, and to be continued through Tuesday night.      Sharlee Blew, N.P.      Colleen Can. Deborah Chalk, M.D.  Electronically Signed    LC/MEDQ  D:  05/14/2008  T:  05/15/2008  Job:  [id number]

## 2010-05-30 NOTE — Discharge Summary (Signed)
NAME:  Justin Lang, Justin Lang NO.:  [id number]   MEDICAL RECORD NO.:  [id number]          PATIENT TYPE:  OIB   LOCATION:  2502                         FACILITY:  [facility]   PHYSICIAN:  Colleen Can. Deborah Chalk, M.D.DATE OF BIRTH:  Aug 09, 1922   DATE OF ADMISSION:  05/19/2008  DATE OF DISCHARGE:  05/20/2008                               DISCHARGE SUMMARY   DISCHARGE DIAGNOSES:  1. Syncope in the setting of profound bradycardia now status post dual      chamber pacemaker implantation with a Medtronic EnRhythm [id number],      serial number [id number].  2. History of metastatic colon cancer.  3. Pulmonary embolus.  4. Status post Greenfield filter placed in 1998.  5. Chronic Coumadin therapy.  6. Hypertension, now initiated on low-dose ACE inhibitor.   HISTORY OF PRESENT ILLNESS:  The patient is a very pleasant 77 year old  white male who has multiple medical problems.  He was referred for  pacemaker implantation.  He had had evaluation for syncope and had a  Holter monitor which showed episodes of profound bradycardia with  recurrent pauses.  He had heart rates down in the low 20s.  He was  subsequently referred for pacemaker implantation.   Please see the history and physical for further patient presentation and  profile.   LABORATORY DATA AT ADMISSION:  His INR was down to 1.3 after Coumadin  was held for 3 days.  His chemistries were normal with a BUN of 14 and  creatinine 1.1.  CBC was normal as well.   HOSPITAL COURSE:  The patient was admitted electively.  He underwent  dual chamber pacemaker implantation with a Medtronic EnRhythm [id number],  serial number [id number].  That procedure was tolerated well without any  no known complications.  Postprocedure, he was transferred to 2500.  He  did have some issues with hypertension and has been started on medical  therapy.  Today on March 20, 2008 he is doing well without complaints.  Overall physical exam is unremarkable and he is  felt to be a  satisfactory candidate for discharge.   DISCHARGE CONDITION:  Stable.   DISCHARGE INR:  1.2.   DISCHARGE DIET:  Low-salt heart-healthy.   Extensive written instructions are given regarding pacemaker care,  specifically not to raise the left arm above his head for the next 2-3  weeks.  He is also to avoid getting the wound wet for the first week as  well.  He will use Betadine swabs to paint the site two times a day for  the next 3 days.   DISCHARGE MEDICINES:  We will resume his Coumadin at his previous dose  which was 7.5 mg 5 days a week to 6.5 mg daily 2 days.  Lovenox will be  resumed prior to discharge as well and that will be followed up with Dr.  Mariel Sleet.  We will also start him on lisinopril 2.5 mg a day and he can  continue his B12 supplementation as he was taking before.   Greater than 30 minutes spent for discharge.   We will plan on seeing him  in the office early next week, certainly  sooner if any problems arise in the interim.      Sharlee Blew, N.P.      Colleen Can. Deborah Chalk, M.D.  Electronically Signed    LC/MEDQ  D:  05/20/2008  T:  05/20/2008  Job:  [id number]

## 2010-05-30 NOTE — Cardiovascular Report (Signed)
NAME:  BASHIR, MARCHETTI NO.:  [id number]   MEDICAL RECORD NO.:  [id number]          PATIENT TYPE:  OIB   LOCATION:  2502                         FACILITY:  [facility]   PHYSICIAN:  Colleen Can. Deborah Chalk, M.D.DATE OF BIRTH:  February 21, 1922   DATE OF PROCEDURE:  05/19/2008  DATE OF DISCHARGE:                            CARDIAC CATHETERIZATION   HISTORY:  Mr. Knust is referred for permanent transvenous pacemaker  because of intermittent syncope as well as intermittent periods of  profound bradycardia documented by Holter monitoring.   PROCEDURE:  The left subclavicular area was prepped and draped.  The  area was infiltrated with 1% Xylocaine.  Vancomycin was given as a  preop.  He also received a preop of Valium 10 mg p.o. with nice sedation  throughout the procedure.  The area was infiltrated with 1% Xylocaine.  A subcutaneous pocket was created through the prepectoral fascia using  sharp Bovie dissection.   Two punctures were made into the left subclavian vein over top of the  first rib.  Guidewires were introduced.  Using 7-[employer] [facility]  introducers, atrial and ventricular leads were introduced.  The  ventricular lead was a Medtronic 4076 - 58 cm lead serial number [id number]  [id number] was placed on the right ventricular septum.  The following  measurements were then taken.  R waves have been 4.8 mV with a 5.0 volts  impedance in 854 ohms.  Threshold was maintained at 0.6 volts with a 0.5  milliseconds pulse width.  There is 10 volt.  Evaluation was negative  for diaphragmatic stimulation.  Current was 0.9 mA.   The atrial lead was a Medtronic 4076 - 52 cm, serial number [id number]  was placed in the right atrial appendage.  The P-waves were 4.5 mV with  a 5.0 volt, impedance of 543 ohms.  Threshold was maintained at 0.8  volts at 0.5 milliseconds pulse width with a 10 volt.  Delivery was  negative for diaphragmatic stimulation.  Current was 1.6 mA.  Both leads  were then sutured  in place.  The wound was flushed with gentamicin  solution.  The leads were connected to a Medtronic EnRhythym [id number],  serial number [id number].  The unit was sutured in place.  The pocket  was again irrigated.  The wound was then closed with 2-0, subsequently 4-  0 Vicryl and Steri-Strips were applied.  The patient tolerated the  procedure well.      Colleen Can. Deborah Chalk, M.D.  Electronically Signed     SNT/MEDQ  D:  05/19/2008  T:  05/20/2008  Job:  [id number]   cc:   Ladona Horns. Mariel Sleet, MD

## 2010-06-05 ENCOUNTER — Encounter (HOSPITAL_COMMUNITY): Payer: Medicare Other | Attending: Oncology

## 2010-06-05 ENCOUNTER — Other Ambulatory Visit (HOSPITAL_COMMUNITY): Payer: Self-pay | Admitting: Oncology

## 2010-06-05 DIAGNOSIS — Z7901 Long term (current) use of anticoagulants: Secondary | ICD-10-CM | POA: Insufficient documentation

## 2010-06-05 DIAGNOSIS — Z86718 Personal history of other venous thrombosis and embolism: Secondary | ICD-10-CM | POA: Insufficient documentation

## 2010-06-05 DIAGNOSIS — D6859 Other primary thrombophilia: Secondary | ICD-10-CM | POA: Insufficient documentation

## 2010-06-05 DIAGNOSIS — Z85038 Personal history of other malignant neoplasm of large intestine: Secondary | ICD-10-CM | POA: Insufficient documentation

## 2010-06-05 DIAGNOSIS — I82409 Acute embolism and thrombosis of unspecified deep veins of unspecified lower extremity: Secondary | ICD-10-CM

## 2010-06-05 LAB — PROTIME-INR
INR: 1.89 — ABNORMAL HIGH (ref 0.00–1.49)
Prothrombin Time: 21.9 seconds — ABNORMAL HIGH (ref 11.6–15.2)

## 2010-06-13 ENCOUNTER — Encounter (HOSPITAL_COMMUNITY): Payer: Medicare Other

## 2010-06-13 ENCOUNTER — Other Ambulatory Visit (HOSPITAL_COMMUNITY): Payer: Self-pay | Admitting: Oncology

## 2010-06-13 DIAGNOSIS — I82409 Acute embolism and thrombosis of unspecified deep veins of unspecified lower extremity: Secondary | ICD-10-CM

## 2010-06-13 LAB — PROTIME-INR: INR: 2.72 — ABNORMAL HIGH (ref 0.00–1.49)

## 2010-06-20 ENCOUNTER — Other Ambulatory Visit (HOSPITAL_COMMUNITY): Payer: Self-pay | Admitting: Oncology

## 2010-06-20 ENCOUNTER — Encounter (HOSPITAL_COMMUNITY): Payer: Medicare Other | Attending: Oncology

## 2010-06-20 DIAGNOSIS — D6859 Other primary thrombophilia: Secondary | ICD-10-CM | POA: Insufficient documentation

## 2010-06-20 DIAGNOSIS — Z86718 Personal history of other venous thrombosis and embolism: Secondary | ICD-10-CM | POA: Insufficient documentation

## 2010-06-20 DIAGNOSIS — I82409 Acute embolism and thrombosis of unspecified deep veins of unspecified lower extremity: Secondary | ICD-10-CM

## 2010-06-20 DIAGNOSIS — Z7901 Long term (current) use of anticoagulants: Secondary | ICD-10-CM | POA: Insufficient documentation

## 2010-06-20 DIAGNOSIS — Z85038 Personal history of other malignant neoplasm of large intestine: Secondary | ICD-10-CM | POA: Insufficient documentation

## 2010-06-22 ENCOUNTER — Other Ambulatory Visit (HOSPITAL_COMMUNITY): Payer: Self-pay | Admitting: Oncology

## 2010-06-22 ENCOUNTER — Encounter (HOSPITAL_COMMUNITY): Payer: Medicare Other

## 2010-06-22 DIAGNOSIS — C189 Malignant neoplasm of colon, unspecified: Secondary | ICD-10-CM

## 2010-06-22 DIAGNOSIS — I82409 Acute embolism and thrombosis of unspecified deep veins of unspecified lower extremity: Secondary | ICD-10-CM

## 2010-06-22 LAB — PROTIME-INR: INR: 3.54 — ABNORMAL HIGH (ref 0.00–1.49)

## 2010-06-23 ENCOUNTER — Encounter (HOSPITAL_COMMUNITY): Payer: Medicare Other

## 2010-06-23 ENCOUNTER — Other Ambulatory Visit (HOSPITAL_COMMUNITY): Payer: Self-pay | Admitting: Oncology

## 2010-06-23 DIAGNOSIS — I82409 Acute embolism and thrombosis of unspecified deep veins of unspecified lower extremity: Secondary | ICD-10-CM

## 2010-06-23 LAB — PROTIME-INR: Prothrombin Time: 26 seconds — ABNORMAL HIGH (ref 11.6–15.2)

## 2010-06-26 ENCOUNTER — Encounter: Payer: Self-pay | Admitting: Internal Medicine

## 2010-06-26 ENCOUNTER — Other Ambulatory Visit (HOSPITAL_COMMUNITY): Payer: Self-pay | Admitting: Oncology

## 2010-06-26 ENCOUNTER — Encounter (HOSPITAL_COMMUNITY): Payer: Medicare Other

## 2010-06-26 DIAGNOSIS — I82409 Acute embolism and thrombosis of unspecified deep veins of unspecified lower extremity: Secondary | ICD-10-CM

## 2010-06-26 LAB — PROTIME-INR
INR: 1.32 (ref 0.00–1.49)
Prothrombin Time: 16.6 seconds — ABNORMAL HIGH (ref 11.6–15.2)

## 2010-06-30 ENCOUNTER — Encounter (HOSPITAL_COMMUNITY): Payer: Medicare Other | Admitting: Oncology

## 2010-06-30 ENCOUNTER — Encounter (HOSPITAL_COMMUNITY): Payer: Medicare Other

## 2010-06-30 ENCOUNTER — Other Ambulatory Visit (HOSPITAL_COMMUNITY): Payer: Self-pay | Admitting: Oncology

## 2010-06-30 DIAGNOSIS — C189 Malignant neoplasm of colon, unspecified: Secondary | ICD-10-CM

## 2010-06-30 LAB — PROTIME-INR: Prothrombin Time: 20.6 seconds — ABNORMAL HIGH (ref 11.6–15.2)

## 2010-07-04 ENCOUNTER — Ambulatory Visit (INDEPENDENT_AMBULATORY_CARE_PROVIDER_SITE_OTHER): Payer: Medicare Other | Admitting: Internal Medicine

## 2010-07-04 ENCOUNTER — Encounter: Payer: Self-pay | Admitting: Internal Medicine

## 2010-07-04 VITALS — BP 142/94 | HR 68 | Wt 163.0 lb

## 2010-07-04 DIAGNOSIS — I495 Sick sinus syndrome: Secondary | ICD-10-CM

## 2010-07-04 DIAGNOSIS — Z950 Presence of cardiac pacemaker: Secondary | ICD-10-CM

## 2010-07-04 DIAGNOSIS — R030 Elevated blood-pressure reading, without diagnosis of hypertension: Secondary | ICD-10-CM

## 2010-07-04 NOTE — Patient Instructions (Signed)
Your physician recommends that you schedule a follow-up appointment in: 6 months  

## 2010-07-05 ENCOUNTER — Encounter: Payer: Self-pay | Admitting: Internal Medicine

## 2010-07-05 DIAGNOSIS — Z950 Presence of cardiac pacemaker: Secondary | ICD-10-CM | POA: Insufficient documentation

## 2010-07-05 NOTE — Assessment & Plan Note (Signed)
His device is working normally. Will recheck in several months. 

## 2010-07-05 NOTE — Assessment & Plan Note (Signed)
His blood pressure is elevated today. I've encouraged him to maintain a low-sodium diet. No additional medical therapy at this time.

## 2010-07-05 NOTE — Progress Notes (Signed)
HPI [name] is referred today for additional and ongoing pacemaker evaluation. The patient has previously been followed by Dr. Delfin Edis. He has a history of symptomatic bradycardia secondary to high-grade heart block. He also has a diagnosis of recurrent cancer. He is undergoing evaluation of this presently. The patient denies chest pain or shortness of breath. Despite his multiple comorbidities, the patient remains active farming cattle. He denies syncope. Allergies  Allergen Reactions  . Celecoxib     REACTION: ??     Current Outpatient Prescriptions  Medication Sig Dispense Refill  . acetaminophen (TYLENOL) 325 MG tablet Take 650 mg by mouth every 6 (six) hours as needed.        . warfarin (COUMADIN) 4 MG tablet Take 8 mg by mouth daily.           Past Medical History  Diagnosis Date  . Colon cancer     hx of metastic- treated with surgery and readiation  . DVT (deep venous thrombosis)     hx of s/p Greenfield filter  . OAB (overactive bladder)   . Arthritis   . Cataracts, bilateral   . FH: factor V Leiden deficiency   . History of nephrolithiasis   . History of ankle fracture   . 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  . Peripheral neuropathy   . Vitamin B12 deficiency     ROS:   All systems reviewed and negative except as noted in the HPI.   Past Surgical History  Procedure Date  . Colectomy     sigmoid secondary to colon ca- 1997  . Exploratory laparotomy     metastatic retroperitoneal lymph node- 2002  . Total knee arthroplasty     right-1991; left-1992  . Back surgeries     x3  . Filter replacement   . Left ankle surgery   . Incisional herniorraphy      No family history on file.   History   Social History  . Marital Status: Married    Spouse Name: N/A    Number of Children: N/A  . Years of Education: N/A   Occupational History  . Not on file.   Social History Main Topics  . Smoking status: Never Smoker   . Smokeless  tobacco: Not on file  . Alcohol Use: No  . Drug Use: No  . Sexually Active: Not on file   Other Topics Concern  . Not on file   Social History Narrative   Married, lives with spouse.      BP 142/94  Pulse 68  Wt 163 lb (73.936 kg)  Physical Exam: elderly appearing NAD HEENT: Unremarkable Neck:  No JVD, no thyromegally Lymphatics:  No adenopathy Back:  No CVA tenderness Lungs:  Clear. Well-healed pacemaker incision. HEART:  Regular rate rhythm, no murmurs, no rubs, no clicks Abd:   positive bowel sounds, no organomegally, no rebound, no guarding Ext:  2 plus pulses, no edema, no cyanosis, no clubbing Skin:  No rashes no nodules Neuro:  CN II through XII intact, motor grossly intact  DEVICE  Normal device function.  See PaceArt for details.    Assess/Plan:

## 2010-07-06 ENCOUNTER — Encounter (HOSPITAL_COMMUNITY): Payer: Medicare Other

## 2010-07-06 ENCOUNTER — Other Ambulatory Visit (HOSPITAL_COMMUNITY): Payer: Self-pay | Admitting: Oncology

## 2010-07-06 DIAGNOSIS — Z86711 Personal history of pulmonary embolism: Secondary | ICD-10-CM

## 2010-07-06 DIAGNOSIS — C189 Malignant neoplasm of colon, unspecified: Secondary | ICD-10-CM

## 2010-07-06 DIAGNOSIS — Z86718 Personal history of other venous thrombosis and embolism: Secondary | ICD-10-CM

## 2010-07-11 ENCOUNTER — Encounter (HOSPITAL_COMMUNITY): Payer: Self-pay

## 2010-07-11 ENCOUNTER — Encounter (HOSPITAL_COMMUNITY)
Admission: RE | Admit: 2010-07-11 | Discharge: 2010-07-11 | Disposition: A | Discharge status: Home / Self Care | Payer: Medicare Other | Source: Ambulatory Visit | Attending: Oncology | Admitting: Oncology

## 2010-07-11 DIAGNOSIS — C189 Malignant neoplasm of colon, unspecified: Secondary | ICD-10-CM | POA: Insufficient documentation

## 2010-07-11 DIAGNOSIS — Z923 Personal history of irradiation: Secondary | ICD-10-CM | POA: Insufficient documentation

## 2010-07-11 LAB — GLUCOSE, CAPILLARY: Glucose-Capillary: 94 mg/dL (ref 70–99)

## 2010-07-11 MED ORDER — FLUDEOXYGLUCOSE F - 18 (FDG) INJECTION
18.8000 | Freq: Once | INTRAVENOUS | Status: AC | PRN
  Administered 2010-07-11: 18.8 via INTRAVENOUS

## 2010-07-13 ENCOUNTER — Encounter (HOSPITAL_COMMUNITY): Payer: Medicare Other

## 2010-07-13 ENCOUNTER — Other Ambulatory Visit (HOSPITAL_COMMUNITY): Payer: Self-pay | Admitting: Oncology

## 2010-07-13 DIAGNOSIS — I82409 Acute embolism and thrombosis of unspecified deep veins of unspecified lower extremity: Secondary | ICD-10-CM

## 2010-07-13 LAB — PROTIME-INR: Prothrombin Time: 22 seconds — ABNORMAL HIGH (ref 11.6–15.2)

## 2010-07-20 ENCOUNTER — Encounter (HOSPITAL_COMMUNITY): Payer: Medicare Other | Attending: Oncology

## 2010-07-20 ENCOUNTER — Other Ambulatory Visit (HOSPITAL_COMMUNITY): Payer: Self-pay | Admitting: Oncology

## 2010-07-20 DIAGNOSIS — Z85038 Personal history of other malignant neoplasm of large intestine: Secondary | ICD-10-CM | POA: Insufficient documentation

## 2010-07-20 DIAGNOSIS — D6859 Other primary thrombophilia: Secondary | ICD-10-CM | POA: Insufficient documentation

## 2010-07-20 DIAGNOSIS — Z7901 Long term (current) use of anticoagulants: Secondary | ICD-10-CM | POA: Insufficient documentation

## 2010-07-20 DIAGNOSIS — Z86718 Personal history of other venous thrombosis and embolism: Secondary | ICD-10-CM | POA: Insufficient documentation

## 2010-07-20 DIAGNOSIS — I82409 Acute embolism and thrombosis of unspecified deep veins of unspecified lower extremity: Secondary | ICD-10-CM

## 2010-07-20 LAB — PROTIME-INR
INR: 2.29 — ABNORMAL HIGH (ref 0.00–1.49)
Prothrombin Time: 25.6 seconds — ABNORMAL HIGH (ref 11.6–15.2)

## 2010-07-27 ENCOUNTER — Encounter (INDEPENDENT_AMBULATORY_CARE_PROVIDER_SITE_OTHER): Payer: Self-pay

## 2010-08-08 ENCOUNTER — Encounter (INDEPENDENT_AMBULATORY_CARE_PROVIDER_SITE_OTHER): Payer: Self-pay | Admitting: *Deleted

## 2010-08-08 ENCOUNTER — Telehealth (INDEPENDENT_AMBULATORY_CARE_PROVIDER_SITE_OTHER): Payer: Self-pay | Admitting: *Deleted

## 2010-08-08 ENCOUNTER — Telehealth (HOSPITAL_COMMUNITY): Payer: Self-pay | Admitting: *Deleted

## 2010-08-08 ENCOUNTER — Ambulatory Visit (INDEPENDENT_AMBULATORY_CARE_PROVIDER_SITE_OTHER): Payer: Medicare Other | Admitting: Internal Medicine

## 2010-08-08 ENCOUNTER — Encounter (INDEPENDENT_AMBULATORY_CARE_PROVIDER_SITE_OTHER): Payer: Self-pay | Admitting: Internal Medicine

## 2010-08-08 VITALS — BP 112/62 | HR 72 | Temp 96.9°F | Ht 69.0 in | Wt 160.3 lb

## 2010-08-08 DIAGNOSIS — I2699 Other pulmonary embolism without acute cor pulmonale: Secondary | ICD-10-CM

## 2010-08-08 DIAGNOSIS — I82409 Acute embolism and thrombosis of unspecified deep veins of unspecified lower extremity: Secondary | ICD-10-CM

## 2010-08-08 DIAGNOSIS — C189 Malignant neoplasm of colon, unspecified: Secondary | ICD-10-CM

## 2010-08-08 MED ORDER — PEG-KCL-NACL-NASULF-NA ASC-C 100 G PO SOLR: 1.0000 | Freq: Once | ORAL | Status: DC

## 2010-08-08 MED ORDER — ENOXAPARIN SODIUM 40 MG/0.4ML ~~LOC~~ SOLN: 40.0000 mg | SUBCUTANEOUS | Status: DC

## 2010-08-08 NOTE — Telephone Encounter (Signed)
Patient need movi prep, [facility]

## 2010-08-08 NOTE — Telephone Encounter (Signed)
Patient will be given typed instructions on Thursday 7/26 regarding how to take Coumadin and Lovenox in regards to his colonoscopy that he will be having on August 3rd. His wife would rather [place] give the instructions to them on Thursday rather than me giving the instructions over the phone. I have placed the instructions in an envelope and paperclipped it to the front of his chart. I am calling in Lovenox 40mg  sq q24h #12  to [facility]. Wife verbalized understanding.

## 2010-08-08 NOTE — Progress Notes (Signed)
Subjective:     Patient ID: Justin Lang, male   DOB: December 02, 1922, 77 y.o.   MRN: [id number]  HPI Referred by Dr.Neijstrom for abnormal bowel movements.  His bowel movements are loose and watery. Symptoms for about 3-4 months. His stools appear black most of the time. He is having 4-5 stools a day and then at other times he will be constipated.  He has had fecal incontinence and did not realize it.  His has lost from 180 to 160.3 in the past year. Appetite is not good. He does not have abdominal pain. No nausea or vomiting.  No bright red blood recently. No acid reflux.  Hx colon cancer x 2 with surgery. He was diagnosed in 1997 with colon cancer. He also has a hx of diverticulitis.   Last colon resection was in 2002 at [facility]. H and H 02/28/10 13.9 and 42.9, Ferritin 131, Iron 69, TIBC 221, Vitamin B12 249, Folate 14.0.  PET scan Last colonoscopy was in 2009 by Dr. Jena Gauss which reveal a normal rectum, extensive colonic diverticula with a significantly foreshortened colon status post colonic resection previously. Normal anastomosis, normal distal small bowel.  No evidence of recurrent neoplasia or polyp status post biopsy and stool sampling.  PET Scan skull base to thigh: No evidence of colon cancer recurrence within the chest, abdomen, or pelvis.  Hypermetabolic activity associated with what appears to be a lesion at the tip of the appendix.  This lesion is stable in size compared back to 2009. Differential would include inflammatory process vs neoplastic process. Review of Systems     Objective:   Physical Exam Alert and oriented. Skin warm and dry. Oral mucosa is moist.  Natural teeth in good condition.  Sclera anicteric, conjunctivae is pink. Lungs clear. Heart regular rate and rhythm.  Abdomen is soft. Bowel sounds are positive. No hepatomegaly. Firm area to umblical area. Some abdominal tenderness but not pain. Stool guaiac positive. No edema to lower extremities. 2+ pulses bilaterally. Patient is  alert and oriented.    Assessment:     Change in bowels.  Colonic neoplasms needs to be ruled out given past history of colon cancer.  Guaiac positive stools.    Plan:    Will schedule a colonoscopy with Dr. Karilyn Cota.  If colonoscopy is normal, will proceed with an EGD.    The risks and benefits such as perforation, bleeding, and infection were reviewed with the patient and is agreeable.

## 2010-08-09 ENCOUNTER — Telehealth (INDEPENDENT_AMBULATORY_CARE_PROVIDER_SITE_OTHER): Payer: Self-pay | Admitting: *Deleted

## 2010-08-09 NOTE — Telephone Encounter (Signed)
TCS +/- EGD sch'd 08/18/10 @ 7:30 (6:30), movi prep given

## 2010-08-10 ENCOUNTER — Other Ambulatory Visit (HOSPITAL_COMMUNITY): Payer: Self-pay | Admitting: Oncology

## 2010-08-10 ENCOUNTER — Encounter (HOSPITAL_BASED_OUTPATIENT_CLINIC_OR_DEPARTMENT_OTHER): Payer: Medicare Other

## 2010-08-10 ENCOUNTER — Telehealth (HOSPITAL_COMMUNITY): Payer: Self-pay | Admitting: *Deleted

## 2010-08-10 DIAGNOSIS — Z86718 Personal history of other venous thrombosis and embolism: Secondary | ICD-10-CM

## 2010-08-10 DIAGNOSIS — D682 Hereditary deficiency of other clotting factors: Secondary | ICD-10-CM

## 2010-08-10 NOTE — Progress Notes (Signed)
Labs drawn today for pt.  Patient on 7mg  coud.  Call patient at [phone]

## 2010-08-10 NOTE — Progress Notes (Signed)
Wife notified to continue coumadin dose but to proceed with previously ordered coumadin/lovenox orders per Dr. Mariel Sleet. She verbalized understanding.

## 2010-08-17 NOTE — Telephone Encounter (Signed)
Wife instructed to continue coumadin dose until it was time stop coumadin and start lovenox as previously ordered by Dr. Mariel Sleet. She verbalized understanding.    By Lennice Sites           [id number]  [id number]

## 2010-08-18 ENCOUNTER — Encounter (HOSPITAL_COMMUNITY): Payer: Self-pay

## 2010-08-18 ENCOUNTER — Encounter (HOSPITAL_COMMUNITY)
Admission: RE | Disposition: A | Discharge status: Home / Self Care | Payer: Self-pay | Source: Ambulatory Visit | Attending: Internal Medicine

## 2010-08-18 ENCOUNTER — Ambulatory Visit (HOSPITAL_COMMUNITY)
Admission: RE | Admit: 2010-08-18 | Discharge: 2010-08-18 | Disposition: A | Discharge status: Home / Self Care | Payer: Medicare Other | Source: Ambulatory Visit | Attending: Internal Medicine | Admitting: Internal Medicine

## 2010-08-18 DIAGNOSIS — K5730 Diverticulosis of large intestine without perforation or abscess without bleeding: Secondary | ICD-10-CM

## 2010-08-18 DIAGNOSIS — Z980 Intestinal bypass and anastomosis status: Secondary | ICD-10-CM | POA: Insufficient documentation

## 2010-08-18 DIAGNOSIS — Z01812 Encounter for preprocedural laboratory examination: Secondary | ICD-10-CM | POA: Insufficient documentation

## 2010-08-18 DIAGNOSIS — Z85038 Personal history of other malignant neoplasm of large intestine: Secondary | ICD-10-CM | POA: Insufficient documentation

## 2010-08-18 DIAGNOSIS — R634 Abnormal weight loss: Secondary | ICD-10-CM | POA: Insufficient documentation

## 2010-08-18 DIAGNOSIS — R197 Diarrhea, unspecified: Secondary | ICD-10-CM | POA: Insufficient documentation

## 2010-08-18 DIAGNOSIS — K921 Melena: Secondary | ICD-10-CM

## 2010-08-18 DIAGNOSIS — K6389 Other specified diseases of intestine: Secondary | ICD-10-CM

## 2010-08-18 DIAGNOSIS — R933 Abnormal findings on diagnostic imaging of other parts of digestive tract: Secondary | ICD-10-CM | POA: Insufficient documentation

## 2010-08-18 DIAGNOSIS — K208 Other esophagitis: Secondary | ICD-10-CM

## 2010-08-18 DIAGNOSIS — K625 Hemorrhage of anus and rectum: Secondary | ICD-10-CM

## 2010-08-18 HISTORY — PX: COLONOSCOPY: SHX5424

## 2010-08-18 HISTORY — PX: ESOPHAGOGASTRODUODENOSCOPY: SHX5428

## 2010-08-18 LAB — CBC
HCT: 39 % (ref 39.0–52.0)
MCH: 29.5 pg (ref 26.0–34.0)
MCV: 89.9 fL (ref 78.0–100.0)
Platelets: 226 10*3/uL (ref 150–400)
RBC: 4.34 MIL/uL (ref 4.22–5.81)
RDW: 15.1 % (ref 11.5–15.5)

## 2010-08-18 SURGERY — COLONOSCOPY
Anesthesia: Moderate Sedation

## 2010-08-18 MED ORDER — SODIUM CHLORIDE 0.45 % IV SOLN
Freq: Once | INTRAVENOUS | Status: AC
  Administered 2010-08-18: 07:00:00 via INTRAVENOUS

## 2010-08-18 MED ORDER — MIDAZOLAM HCL 5 MG/5ML IJ SOLN
INTRAMUSCULAR | Status: DC | PRN
  Administered 2010-08-18 (×2): 1 mg via INTRAVENOUS

## 2010-08-18 MED ORDER — MEPERIDINE HCL 50 MG/ML IJ SOLN
INTRAMUSCULAR | Status: AC
  Filled 2010-08-18: qty 1

## 2010-08-18 MED ORDER — BENEFIBER PO PACK: 1.0000 | PACK | Freq: Every day | ORAL | Status: DC

## 2010-08-18 MED ORDER — MIDAZOLAM HCL 5 MG/5ML IJ SOLN
INTRAMUSCULAR | Status: AC
  Filled 2010-08-18: qty 5

## 2010-08-18 MED ORDER — LOPERAMIDE HCL 2 MG PO TABS: 2.0000 mg | ORAL_TABLET | Freq: Every day | ORAL | Status: DC

## 2010-08-18 MED ORDER — STERILE WATER FOR IRRIGATION IR SOLN
Status: DC | PRN
  Administered 2010-08-18: 08:00:00

## 2010-08-18 MED ORDER — MEPERIDINE HCL 25 MG/ML IJ SOLN
INTRAMUSCULAR | Status: DC | PRN
  Administered 2010-08-18: 25 mg via INTRAVENOUS

## 2010-08-18 MED ORDER — PANTOPRAZOLE SODIUM 40 MG PO TBEC: 40.0000 mg | DELAYED_RELEASE_TABLET | Freq: Every day | ORAL | Status: DC

## 2010-08-18 MED ORDER — BUTAMBEN-TETRACAINE-BENZOCAINE 2-2-14 % EX AERO
INHALATION_SPRAY | CUTANEOUS | Status: DC | PRN
  Administered 2010-08-18: 2 via TOPICAL

## 2010-08-18 NOTE — H&P (Signed)
Primary Care Physician:  Dwana Melena, MD Primary Gastroenterologist:  Dr. Karilyn Cota  Pre-Procedure History & Physical: HPI:  Justin Lang is a 77 y.o. male here for  for esophagogastroduodenoscopy and colonoscopy. He has history of metastatic colon carcinoma initial sigmoid colon resection in 1997 and he had metastatic disease treated with surgery and radiation therapy. He had a PET scan recently and it showed hypermetabolic activity in the tip of the appendix also noted on 2009 study. In the meantime he has been experiencing intermittent melena, hematochezia and diarrhea. However he does have normal stools intermittently. He does not have a good appetite he has lost about 20 pounds in the last one year also complains of generalized soreness to his abdomen. He denies nausea vomiting dysphagia or heartburn.   patient's Coumadin is on hold and he is being bridged at Lovenox.  Past Medical History  Diagnosis Date  . Colon cancer     hx of metastic- treated with surgery and readiation  . DVT (deep venous thrombosis)     hx of s/p Greenfield filter  . OAB (overactive bladder)   . Arthritis   . Cataracts, bilateral   . FH: factor V Leiden deficiency   . History of nephrolithiasis   . History of ankle fracture   . 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  . Peripheral neuropathy   . Vitamin B12 deficiency     Past Surgical History  Procedure Date  . Colectomy     sigmoid secondary to colon ca- 1997  . Exploratory laparotomy     metastatic retroperitoneal lymph node- 2002  . Total knee arthroplasty     right-1991; left-1992  . Back surgeries     x3  . Filter replacement   . Left ankle surgery   . Incisional herniorraphy   . Pacemaker insertion   . Replacement total knee bilateral   . Colon resection     Prior to Admission medications   Medication Sig Start Date End Date Taking? Authorizing Provider  enoxaparin (LOVENOX) 40 MG/0.4ML SOLN Inject 0.4 mLs (40 mg total) into  the skin daily. Patient not to take Lovenox on day of colonoscopy 08/18/10 08/13/10 08/25/10 Yes Randall An, MD  peg 3350 powder (MOVIPREP) 100 G SOLR Take 1 kit (100 g total) by mouth once. 08/08/10  Yes Llana Aliment, NP  warfarin (COUMADIN) 4 MG tablet Take 7 mg by mouth daily.    Yes Historical Provider, MD  acetaminophen (TYLENOL) 325 MG tablet Take 650 mg by mouth every 6 (six) hours as needed.      Historical Provider, MD    Allergies  Allergen Reactions  . Celecoxib     REACTION: ??    Family History  Problem Relation Age of Onset  . Anesthesia problems Neg Hx   . Hypotension Neg Hx   . Malignant hyperthermia Neg Hx   . Pseudochol deficiency Neg Hx     History   Social History  . Marital Status: Married    Spouse Name: N/A    Number of Children: N/A  . Years of Education: N/A   Occupational History  . Not on file.   Social History Main Topics  . Smoking status: Never Smoker   . Smokeless tobacco: Not on file  . Alcohol Use: No  . Drug Use: No  . Sexually Active: No   Other Topics Concern  . Not on file   Social History Narrative   Married, lives with  spouse.     Review of Systems: See HPI, otherwise negative ROS  Physical Exam: BP 178/84  Pulse 61  Temp(Src) 97.6 F (36.4 C) (Oral)  Resp 20  Ht 5\' 9"  (1.753 m)  Wt 160 lb (72.576 kg)  BMI 23.63 kg/m2  SpO2 98% General:   Alert,  Well-developed, thin pleasant  Cooperative male in NAD Head:  Normocephalic and atraumatic. Eyes:  Sclera clear, no icterus.   Conjunctiva pink. Mouth:  Oropharyngeal mucosa is normal.  Neck:  Supple; no masses or thyromegaly. Lungs:  Clear throughout to auscultation.   No wheezes, crackles, or rhonchi. No acute distress. Heart:  Regular rate and rhythm; no murmurs, clicks, rubs,  or gallops. Pacemaker located in left pectoral region. Abdomen:   bowel sounds are normal. No bruit noted. Abdomen is soft with mesh in the upper abdominal region no organomegaly or masses  noted. He has mild generalized tenderness without guarding or rebound.  Rectal:  Deferred until time of colonoscopy.   Extremities:  Without clubbing or edema. Neurologic:  Alert and  oriented x4;  grossly normal neurologically. Skin:  Intact without significant lesions or rashes.  Impression/Plan:  Melena and hematochezia in a patient with history of colon carcinoma. Abnormal PET scan with increased activity tip of appendix. Recurrent diarrhea. Will proceed with esophagogastroduodenoscopy and colonoscopy.

## 2010-08-18 NOTE — Op Note (Signed)
PROCEDURE REPORT  PATIENT:  Justin Lang  MR#:  [id number] Birthdate:  30-Jun-1922, 77 y.o., male Endoscopist:  Dr. Malissa Hippo, MD Referred By:  Dr. Glenford Peers. Primary MD:  Dr. Catalina Pizza.  Procedure Date: 08/18/2010  Procedure:   EGD & Colonoscopy  Indications:  Patient is 76 year old Caucasian male with history of metastatic colon carcinoma was initial surgery was in 1997 his last colonoscopy was about 3 years ago was experiencing intermittent hematochezia, melena. He also has anorexia and a 20 pound weight loss in one year. He had a PET scan in June 2012 showed hypermetabolic devotee at tip of appendix is also seen on 2009 study . He also has recurrent diarrhea. On his recent visit to our office he was noted to have heme positive stool.           Informed Consent:  The risks, benefits have been reviewed.  The potential for biopsy, lesion removal, esophageal dilation, etc. have also been discussed.  Questions have been answered.  Patient is agreeable agreeable.  Please see history & physical in medical record for more information.  Medications:  Demerol 25mg  IV Versed 2mg  IV Cetacaine spray topically for oropharyngeal anesthesia  EGD  Description of procedure:  The endoscope was introduced through the mouth and advanced to the second portion of the duodenum without difficulty or limitations. The mucosal surfaces were surveyed very carefully during advancement of the scope and upon withdrawal.  Findings:  Esophagus:  Sensational mucosa was normal. There was edema to Z-line. Focal erythema noted to herniated part of the stomach without stigmata of bleeding GEJ:  35cm Hiatus:  39cm Stomach: Empty and distended well with insufflation. In the proximal stomach were normal. Mucosa at body, antrum, pyloric channel,  fundus and cardia was normal. Duodenum:  Normal bulbar and post bulbar mucosa. Normal villous texture to post bulbar mucosa.  Therapeutic/Diagnostic Maneuvers  Performed:  None.  COLONOSCOPY Description of procedure:  After a digital rectal exam was performed, that colonoscope was advanced from the anus through the rectum and colon to the area of the cecum, ileocecal valve and appendiceal orifice. The cecum was deeply intubated. These structures were well-seen and photographed for the record. From the level of the cecum and ileocecal valve, the scope was slowly and cautiously withdrawn. The mucosal surfaces were carefully surveyed utilizing scope tip to flexion to facilitate fold flattening as needed. The scope was pulled down into the rectum where a thorough exam including retroflexion was performed.  Findings:  Normal appearance to appendiceal orifice and ileocecal valve. Multiple diverticula noted involving the left colon above and below the anastomosis located at 35 cm from the anal margin. 2 erosions noted at anastomosis; however no polyps or mass noted. Pieces of formed stool noted in the left colon.   Therapeutic/Diagnostic Maneuvers Performed:  None  Complications:  None  Cecal Withdrawal Time:  13 minutes  Impression:  Small sliding hiatal hernia with mild changes of esophagitis at GE junction. No evidence of peptic ulcer disease or vascular lesions. Normal appearing appendiceal orifice. Notable diverticula involving the left colon. Colonic anastomosis located at 35 cm from the anal margin with 2 erosions at anastomosis. No evidence of a recurrent colonic primary, polyps or endoscopic colitis Suspect his diarrhea may be secondary to IBS since formed stool noted on this exam.  Recommendations:  Anti-reflex measures Pantoprazole 40 mg every minutes for breakfast daily Patient will resume his Coumadin today at usual dose. He will resume Lovenox today and finish  up on the prescript. He would check his CBC today. If his hemoglobin has dropped will consider further evaluation. High fiber diet. Benefiber 4 g daily. OV in 4  weeks. Benefiber 4 g daily. Imodium OTC 2 mg daily after first bowel movement.  Fae Blossom U  08/18/2010 8:37 AM  CC: Dr. Dwana Melena, MD & Dr. Bonnetta Barry ref. provider found

## 2010-08-22 ENCOUNTER — Emergency Department (HOSPITAL_COMMUNITY): Payer: Medicare Other

## 2010-08-22 ENCOUNTER — Encounter (HOSPITAL_COMMUNITY): Payer: Self-pay | Admitting: Emergency Medicine

## 2010-08-22 ENCOUNTER — Telehealth (INDEPENDENT_AMBULATORY_CARE_PROVIDER_SITE_OTHER): Payer: Self-pay | Admitting: *Deleted

## 2010-08-22 ENCOUNTER — Inpatient Hospital Stay (HOSPITAL_COMMUNITY)
Admission: EM | Admit: 2010-08-22 | Discharge: 2010-08-25 | DRG: 389 | Disposition: A | Discharge status: Home / Self Care | Payer: Medicare Other | Attending: Emergency Medicine | Admitting: Emergency Medicine

## 2010-08-22 DIAGNOSIS — D6859 Other primary thrombophilia: Secondary | ICD-10-CM | POA: Diagnosis present

## 2010-08-22 DIAGNOSIS — Z86718 Personal history of other venous thrombosis and embolism: Secondary | ICD-10-CM

## 2010-08-22 DIAGNOSIS — I2699 Other pulmonary embolism without acute cor pulmonale: Secondary | ICD-10-CM

## 2010-08-22 DIAGNOSIS — K56609 Unspecified intestinal obstruction, unspecified as to partial versus complete obstruction: Principal | ICD-10-CM | POA: Diagnosis present

## 2010-08-22 DIAGNOSIS — Z7901 Long term (current) use of anticoagulants: Secondary | ICD-10-CM

## 2010-08-22 DIAGNOSIS — I82409 Acute embolism and thrombosis of unspecified deep veins of unspecified lower extremity: Secondary | ICD-10-CM

## 2010-08-22 DIAGNOSIS — Z85038 Personal history of other malignant neoplasm of large intestine: Secondary | ICD-10-CM

## 2010-08-22 LAB — DIFFERENTIAL
Basophils Absolute: 0 10*3/uL (ref 0.0–0.1)
Basophils Relative: 0 % (ref 0–1)
Eosinophils Absolute: 0.4 10*3/uL (ref 0.0–0.7)
Monocytes Absolute: 0.6 10*3/uL (ref 0.1–1.0)
Neutro Abs: 5.5 10*3/uL (ref 1.7–7.7)

## 2010-08-22 LAB — BASIC METABOLIC PANEL
BUN: 16 mg/dL (ref 6–23)
CO2: 22 mEq/L (ref 19–32)
Calcium: 9.2 mg/dL (ref 8.4–10.5)
Chloride: 102 mEq/L (ref 96–112)
Creatinine, Ser: 1.38 mg/dL — ABNORMAL HIGH (ref 0.50–1.35)
GFR calc Af Amer: 59 mL/min — ABNORMAL LOW (ref >60)
GFR calc non Af Amer: 49 mL/min — ABNORMAL LOW (ref >60)
Glucose, Bld: 132 mg/dL — ABNORMAL HIGH (ref 70–99)
Potassium: 3.2 mEq/L — ABNORMAL LOW (ref 3.5–5.1)
Sodium: 139 mEq/L (ref 135–145)

## 2010-08-22 LAB — PROTIME-INR
INR: 1.26 (ref 0.00–1.49)
Prothrombin Time: 16.1 seconds — ABNORMAL HIGH (ref 11.6–15.2)

## 2010-08-22 LAB — CBC
HCT: 44.7 % (ref 39.0–52.0)
MCH: 30.2 pg (ref 26.0–34.0)
MCHC: 33.6 g/dL (ref 30.0–36.0)
RDW: 15.3 % (ref 11.5–15.5)

## 2010-08-22 MED ORDER — LACTATED RINGERS IV SOLN
INTRAVENOUS | Status: DC
Start: 2010-08-22 — End: 2010-08-25
  Administered 2010-08-22 – 2010-08-24 (×3): via INTRAVENOUS

## 2010-08-22 MED ORDER — LIDOCAINE VISCOUS 2 % MT SOLN
OROMUCOSAL | Status: AC
  Filled 2010-08-22: qty 20

## 2010-08-22 MED ORDER — ENOXAPARIN SODIUM 80 MG/0.8ML ~~LOC~~ SOLN
70.0000 mg | Freq: Two times a day (BID) | SUBCUTANEOUS | Status: DC
  Administered 2010-08-23 – 2010-08-24 (×3): 70 mg via SUBCUTANEOUS
  Administered 2010-08-24: 80 mg via SUBCUTANEOUS
  Administered 2010-08-25: 70 mg via SUBCUTANEOUS
  Filled 2010-08-22 (×5): qty 0.8

## 2010-08-22 MED ORDER — MORPHINE SULFATE 2 MG/ML IJ SOLN
2.0000 mg | INTRAMUSCULAR | Status: DC | PRN
  Administered 2010-08-22 – 2010-08-25 (×3): 2 mg via INTRAVENOUS
  Filled 2010-08-22 (×3): qty 1

## 2010-08-22 MED ORDER — ONDANSETRON HCL 4 MG/2ML IJ SOLN: 4.0000 mg | Freq: Four times a day (QID) | INTRAMUSCULAR | Status: DC | PRN

## 2010-08-22 MED ORDER — ENOXAPARIN SODIUM 30 MG/0.3ML ~~LOC~~ SOLN
Freq: Two times a day (BID) | SUBCUTANEOUS | Status: DC
  Filled 2010-08-22: qty 0.3

## 2010-08-22 NOTE — Telephone Encounter (Signed)
Patient was called; he is concerned that he has small bowel obstruction; he has had multiple abdominal surgeries He had EGD and a colonoscopy by me on August 3,2012;  he had wide-open anastomosis; he had  multiple diverticula involving the left colon. Patient has not eaten anything today.; Given his symptoms he needs to be evaluated in the emergency room and I have contacted staff at [facility].

## 2010-08-22 NOTE — Telephone Encounter (Signed)
I called [name]. He states that he is having Stomach Tightness, that it is swollen. It started yesterday and has lasted through out the night and day today. His bowels are not moving much but when they do it is either diarrhea or constipation. He is currently taking Coumadin,Lovenox and the PPI that Dr. Renae Fickle gave to him. He says this is how he has been in passed and surgery was done on him.

## 2010-08-22 NOTE — ED Notes (Signed)
Patient sent by Dr Karilyn Cota for possible bowel obstruction. Patient had colonoscopy x1 week ago. Per patient hx of bowel obstruction. Patient reports abd pain and dirrahea. Patient denies any fevers or nausea. Abd distended and firm.

## 2010-08-22 NOTE — ED Notes (Signed)
Pt returned from xray. No change. nad

## 2010-08-22 NOTE — ED Provider Notes (Signed)
History   Scribed for Joya Gaskins, MD, the patient was seen in room APA19/APA19 . This chart was scribed by Desma Paganini. This patient's care was started at 2:15 PM .    CSN: [id number] Arrival date & time: 08/22/2010  1:57 PM  Chief Complaint  Patient presents with  . Abdominal Pain   HPI Justin Lang is a 77 y.o. male who presents to the Emergency Department complaining of generalized abdominal pain that started yesterday with abdominal distension. GI Dr. Karilyn Cota sent him to the ED for evaluation suspected bowel obstruction. C/o diarrhea but no vomiting, fever, nausea, weakness, dizziness, chest pain, SOB.  Pt has had recent colonoscopy 1 week ago; He has had multiple abdominal surgeries.  PAST MEDICAL HISTORY:  Past Medical History  Diagnosis Date  . Colon cancer     hx of metastic- treated with surgery and readiation  . DVT (deep venous thrombosis)     hx of s/p Greenfield filter  . OAB (overactive bladder)   . Arthritis   . Cataracts, bilateral   . FH: factor V Leiden deficiency   . History of nephrolithiasis   . History of ankle fracture   . 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  . Peripheral neuropathy   . Vitamin B12 deficiency      PAST SURGICAL HISTORY:  Past Surgical History  Procedure Date  . Colectomy     sigmoid secondary to colon ca- 1997  . Exploratory laparotomy     metastatic retroperitoneal lymph node- 2002  . Total knee arthroplasty     right-1991; left-1992  . Back surgeries     x3  . Filter replacement   . Left ankle surgery   . Incisional herniorraphy   . Pacemaker insertion   . Replacement total knee bilateral   . Colon resection      MEDICATIONS:  Previous Medications   ACETAMINOPHEN (TYLENOL) 325 MG TABLET    Take 650 mg by mouth every 6 (six) hours as needed.     ENOXAPARIN (LOVENOX) 40 MG/0.4ML SOLN    Inject 0.4 mLs (40 mg total) into the skin daily. Patient not to take Lovenox on day of colonoscopy 08/18/10   GUAR GUM PACKET    Take 1 packet by mouth at bedtime.   LOPERAMIDE (IMODIUM A-D) 2 MG TABLET    Take 1 tablet (2 mg total) by mouth daily.   PANTOPRAZOLE (PROTONIX) 40 MG TABLET    Take 1 tablet (40 mg total) by mouth daily.   PEG 3350 POWDER (MOVIPREP) 100 G SOLR    Take 1 kit (100 g total) by mouth once.   WARFARIN (COUMADIN) 4 MG TABLET    Take 7 mg by mouth daily.      ALLERGIES:  Allergies as of 08/22/2010 - Review Complete 08/18/2010  Allergen Reaction Noted  . Celecoxib Rash      FAMILY HISTORY:  Family History  Problem Relation Age of Onset  . Anesthesia problems Neg Hx   . Hypotension Neg Hx   . Malignant hyperthermia Neg Hx   . Pseudochol deficiency Neg Hx   . Cancer Sister      SOCIAL HISTORY:  History   Social History  . Marital Status: Married    Spouse Name: N/A    Number of Children: N/A  . Years of Education: N/A   Social History Main Topics  . Smoking status: Never Smoker   . Smokeless tobacco: Never Used  . Alcohol  Use: No  . Drug Use: No  . Sexually Active: No   Other Topics Concern  . None   Social History Narrative   Married, lives with spouse.         Review of Systems 10 Systems reviewed and are negative for acute change except as noted in the HPI.  Physical Exam  BP 153/85  Pulse 62  Temp(Src) 97.4 F (36.3 C) (Oral)  Resp 18  Ht 5\' 9"  (1.753 m)  Wt 161 lb 11.2 oz (73.347 kg)  BMI 23.88 kg/m2  SpO2 98%  Physical Exam CONSTITUTIONAL: Well developed/well nourished HEAD AND FACE: Normocephalic/atraumatic EYES: EOMI/PERRL ENMT: Mucous membranes moist NECK: supple no meningeal signs SPINE:entire spine nontender CV: S1/S2 noted, no murmurs/rubs/gallops noted LUNGS: Lungs are clear to auscultation bilaterally, no apparent distress ABDOMEN: soft, diffuse abdominal tenderness, no rebound or guarding; distended; decreased bowel sounds. NEURO: Pt is awake/alert, moves all extremitiesx4 EXTREMITIES: pulses normal, full ROM SKIN:  warm, color normal PSYCH: no abnormalities of mood noted  ED Course  Procedures OTHER DATA REVIEWED: Nursing notes, vital signs, and past medical records reviewed.    LABS / RADIOLOGY:  All labs/vitals reviewed and considered xrays reviewed and considered SBO noted, mildy dehydrated   MDM: Pt with h/o multipe abd surgeries before, now with early SBO Will admit abd soft but diffusely tender Dr zieglar to admit   IMPRESSION: Partial small bowel obstruction.   PLAN: Admission to Dr. Leticia Penna Patient and family agreeable with plan    I personally performed the services described in this documentation, which was scribed in my presence. The recorded information has been reviewed and considered. Joya Gaskins, MD        Joya Gaskins, MD 08/22/10 [phone]

## 2010-08-22 NOTE — ED Notes (Signed)
Admitting room not clean. Floor nurse states will call when ready

## 2010-08-22 NOTE — ED Notes (Addendum)
No change. Called floor to check on bed and is ready

## 2010-08-22 NOTE — Telephone Encounter (Signed)
NOTED

## 2010-08-22 NOTE — ED Notes (Signed)
No change in status. Aware will be admitted.

## 2010-08-23 LAB — CBC
HCT: 39.5 % (ref 39.0–52.0)
Hemoglobin: 13.1 g/dL (ref 13.0–17.0)
MCH: 29.6 pg (ref 26.0–34.0)
MCHC: 33.2 g/dL (ref 30.0–36.0)
MCV: 89.4 fL (ref 78.0–100.0)
RDW: 15.2 % (ref 11.5–15.5)

## 2010-08-23 LAB — COMPREHENSIVE METABOLIC PANEL
ALT: 5 U/L (ref 0–53)
AST: 13 U/L (ref 0–37)
Alkaline Phosphatase: 58 U/L (ref 39–117)
GFR calc Af Amer: >60 mL/min (ref >60)
Glucose, Bld: 82 mg/dL (ref 70–99)
Potassium: 3.3 mEq/L — ABNORMAL LOW (ref 3.5–5.1)
Sodium: 140 mEq/L (ref 135–145)
Total Protein: 6.6 g/dL (ref 6.0–8.3)

## 2010-08-23 MED ORDER — POTASSIUM CHLORIDE 10 MEQ/100ML IV SOLN
INTRAVENOUS | Status: AC
  Administered 2010-08-23: 13:00:00
  Filled 2010-08-23: qty 100

## 2010-08-23 MED ORDER — POTASSIUM CHLORIDE 10 MEQ/100ML IV SOLN
10.0000 meq | INTRAVENOUS | Status: AC
  Administered 2010-08-23 (×4): 10 meq via INTRAVENOUS
  Filled 2010-08-23: qty 100

## 2010-08-23 MED ORDER — POTASSIUM CHLORIDE 10 MEQ/100ML IV SOLN
INTRAVENOUS | Status: AC
  Administered 2010-08-23: 17:00:00
  Filled 2010-08-23: qty 100

## 2010-08-23 MED ORDER — POTASSIUM CHLORIDE 10 MEQ/100ML IV SOLN
INTRAVENOUS | Status: AC
  Filled 2010-08-23: qty 100

## 2010-08-23 NOTE — Progress Notes (Signed)
Pt got up to go to the bathroom to wash off with his wife and called me to the room because his NGT had come out.  I told them that I would leave it out for now since the MD wanted clamped for the next 4hrs, but if he had n/v or pain to notify me immediately and I would replace the tube.  Otherwise I would continue to check on him and at about 6:15 I would notify Dr. Leticia Penna and let him know about what happened for further instructions.  Pt verbalized understanding.

## 2010-08-23 NOTE — H&P (Signed)
Justin Lang is an 77 y.o. male.   Chief Complaint: Abdominal pain, nausea HPI: Patient is an 77 year old male well-known to my partner and myself with a history of hypercoagulable state, factor V deficiency who presents with several days of increasing nausea. Is also developed diffuse abdominal discomfort similar to past bowel obstructions. He does state active flatus although bowel movements have been smaller than normal. He denies any melena or hematochezia. No hematemesis. No fevers or chills. Appetite has decreased.  Past Medical History  Diagnosis Date  . Colon cancer     hx of metastic- treated with surgery and readiation  . DVT (deep venous thrombosis)     hx of s/p [place] filter  . OAB (overactive bladder)   . Arthritis   . Cataracts, bilateral   . FH: factor V Leiden deficiency   . History of nephrolithiasis   . History of ankle fracture   . 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  . Peripheral neuropathy   . Vitamin B12 deficiency     Past Surgical History  Procedure Date  . Colectomy     sigmoid secondary to colon ca- 1997  . Exploratory laparotomy     metastatic retroperitoneal lymph node- 2002  . Total knee arthroplasty     right-1991; left-1992  . Back surgeries     x3  . Filter replacement   . Left ankle surgery   . Incisional herniorraphy   . Pacemaker insertion   . Replacement total knee bilateral   . Colon resection     Family History  Problem Relation Age of Onset  . Anesthesia problems Neg Hx   . Hypotension Neg Hx   . Malignant hyperthermia Neg Hx   . Pseudochol deficiency Neg Hx   . Cancer Sister    Social History:  reports that he has never smoked. He has never used smokeless tobacco. He reports that he does not drink alcohol or use illicit drugs.  Allergies:  Allergies  Allergen Reactions  . Celecoxib Rash    Medications Prior to Admission  Medication Dose Route Frequency Provider Last Rate Last Dose  . enoxaparin  (LOVENOX) injection 70 mg  70 mg Subcutaneous Q12H Fabio Bering   70 mg at 08/23/10 0443  . lactated ringers infusion   Intravenous Continuous Fabio Bering 120 mL/hr at 08/23/10 0535    . lidocaine (XYLOCAINE) 2 % viscous mouth solution           . morphine injection 2 mg  2 mg Intravenous Q4H PRN Fabio Bering   2 mg at 08/23/10 1145  . ondansetron (ZOFRAN) injection 4 mg  4 mg Intravenous Q6H PRN Fabio Bering      . potassium chloride 10 mEq in 100 mL IVPB  10 mEq Intravenous Q1 Hr x 4 Blaire Palomino C Winfrey Chillemi   10 mEq at 08/23/10 0800  . potassium chloride 10 MEQ/100ML IVPB           . potassium chloride 10 MEQ/100ML IVPB           . DISCONTD: enoxaparin (LOVENOX) 70 mg   Subcutaneous Q12H Fabio Bering       Medications Prior to Admission  Medication Sig Dispense Refill  . enoxaparin (LOVENOX) 40 MG/0.4ML SOLN Inject 0.4 mLs (40 mg total) into the skin daily. Patient not to take Lovenox on day of colonoscopy 08/18/10  11.2 mL  0  . loperamide (IMODIUM A-D) 2 MG tablet Take  2 mg by mouth as needed. For diarrhea       . pantoprazole (PROTONIX) 40 MG tablet Take 1 tablet (40 mg total) by mouth daily.  30 tablet  5  . warfarin (COUMADIN) 4 MG tablet Take 7 mg by mouth daily.       Marland Kitchen acetaminophen (TYLENOL) 325 MG tablet Take 650 mg by mouth every 6 (six) hours as needed.       Marland Kitchen guar gum packet Take 1 packet by mouth at bedtime.  300 g  12  . peg 3350 powder (MOVIPREP) 100 G SOLR Take 1 kit (100 g total) by mouth once.  1 kit  0    Results for orders placed during the hospital encounter of 08/22/10 (from the past 48 hour(s))  CBC     Status: Normal   Collection Time   08/22/10  2:23 PM      Component Value Range Comment   WBC 8.0  4.0 - 10.5 (K/uL)    RBC 4.96  4.22 - 5.81 (MIL/uL)    Hemoglobin 15.0  13.0 - 17.0 (g/dL)    HCT 16.1  09.6 - 04.5 (%)    MCV 90.1  78.0 - 100.0 (fL)    MCH 30.2  26.0 - 34.0 (pg)    MCHC 33.6  30.0 - 36.0 (g/dL)    RDW 40.9  81.1 - 91.4 (%)    Platelets  232  150 - 400 (K/uL)   DIFFERENTIAL     Status: Normal   Collection Time   08/22/10  2:23 PM      Component Value Range Comment   Neutrophils Relative 69  43 - 77 (%)    Neutro Abs 5.5  1.7 - 7.7 (K/uL)    Lymphocytes Relative 18  12 - 46 (%)    Lymphs Abs 1.4  0.7 - 4.0 (K/uL)    Monocytes Relative 8  3 - 12 (%)    Monocytes Absolute 0.6  0.1 - 1.0 (K/uL)    Eosinophils Relative 5  0 - 5 (%)    Eosinophils Absolute 0.4  0.0 - 0.7 (K/uL)    Basophils Relative 0  0 - 1 (%)    Basophils Absolute 0.0  0.0 - 0.1 (K/uL)   BASIC METABOLIC PANEL     Status: Abnormal   Collection Time   08/22/10  2:23 PM      Component Value Range Comment   Sodium 139  135 - 145 (mEq/L)    Potassium 3.2 (*) 3.5 - 5.1 (mEq/L)    Chloride 102  96 - 112 (mEq/L)    CO2 22  19 - 32 (mEq/L)    Glucose, Bld 132 (*) 70 - 99 (mg/dL)    BUN 16  6 - 23 (mg/dL)    Creatinine, Ser 7.82 (*) 0.50 - 1.35 (mg/dL)    Calcium 9.2  8.4 - 10.5 (mg/dL)    GFR calc non Af Amer 49 (*) >60 (mL/min)    GFR calc Af Amer 59 (*) >60 (mL/min)   PROTIME-INR     Status: Abnormal   Collection Time   08/22/10  2:23 PM      Component Value Range Comment   Prothrombin Time 16.1 (*) 11.6 - 15.2 (seconds)    INR 1.26  0.00 - 1.49    COMPREHENSIVE METABOLIC PANEL     Status: Abnormal   Collection Time   08/23/10  5:29 AM      Component Value Range Comment  Sodium 140  135 - 145 (mEq/L)    Potassium 3.3 (*) 3.5 - 5.1 (mEq/L)    Chloride 104  96 - 112 (mEq/L)    CO2 24  19 - 32 (mEq/L)    Glucose, Bld 82  70 - 99 (mg/dL)    BUN 15  6 - 23 (mg/dL)    Creatinine, Ser 9.14  0.50 - 1.35 (mg/dL)    Calcium 9.1  8.4 - 10.5 (mg/dL)    Total Protein 6.6  6.0 - 8.3 (g/dL)    Albumin 2.8 (*) 3.5 - 5.2 (g/dL)    AST 13  0 - 37 (U/L)    ALT 5  0 - 53 (U/L)    Alkaline Phosphatase 58  39 - 117 (U/L)    Total Bilirubin 0.6  0.3 - 1.2 (mg/dL)    GFR calc non Af Amer 52 (*) >60 (mL/min)    GFR calc Af Amer >60  >60 (mL/min)   CBC     Status: Normal    Collection Time   08/23/10  5:29 AM      Component Value Range Comment   WBC 6.1  4.0 - 10.5 (K/uL)    RBC 4.42  4.22 - 5.81 (MIL/uL)    Hemoglobin 13.1  13.0 - 17.0 (g/dL)    HCT 78.2  95.6 - 21.3 (%)    MCV 89.4  78.0 - 100.0 (fL)    MCH 29.6  26.0 - 34.0 (pg)    MCHC 33.2  30.0 - 36.0 (g/dL)    RDW 08.6  57.8 - 46.9 (%)    Platelets 215  150 - 400 (K/uL)   APTT     Status: Abnormal   Collection Time   08/23/10  5:29 AM      Component Value Range Comment   aPTT 41 (*) 24 - 37 (seconds)    Dg Abd Acute W/chest  08/22/2010  *RADIOLOGY REPORT*  Clinical Data: Abdominal pain.  ACUTE ABDOMEN SERIES (ABDOMEN 2 VIEW & CHEST 1 VIEW)  Comparison: 01/08/2009  Findings: There is slight dilatation of multiple proximal small bowel loops.  Distal small bowel loops are not dilated.  This suggests partial small bowel obstruction.  The colon is not distended.  Air fluid levels are seen in the slightly distended small bowel.  There are numerous surgical staples in the left side of the abdomen.  IVC filter is in place.  14 mm bladder stone.  Dual lead pacer in place.  Heart size and vascularity are normal. Lungs are clear.  IMPRESSION:  Findings consistent with partial small bowel obstruction.  Original Report Authenticated By: Gwynn Burly, M.D.    Review of Systems  Constitutional: Negative for fever and chills.  HENT: Negative.   Eyes: Negative.   Respiratory: Negative.   Cardiovascular: Negative.   Gastrointestinal: Positive for heartburn, nausea, vomiting, abdominal pain (moderate diffuse colicky) and diarrhea (Loose). Negative for blood in stool and melena.  Genitourinary: Negative.   Musculoskeletal: Negative.   Skin: Negative.   Neurological: Positive for weakness.  Endo/Heme/Allergies: Bruises/bleeds easily.  Psychiatric/Behavioral: Negative.     Blood pressure 163/80, pulse 62, temperature 97.3 F (36.3 C), temperature source Oral, resp. rate 18, height 5\' 9"  (1.753 m), weight 73.347  kg (161 lb 11.2 oz), SpO2 95.00%. Physical Exam  Constitutional: He is oriented to person, place, and time. He appears well-developed and well-nourished.       Thin, elderly  HENT:  Head: Normocephalic and atraumatic.  Eyes: Conjunctivae  and EOM are normal. Pupils are equal, round, and reactive to light. No scleral icterus.  Neck: Normal range of motion. Neck supple. No tracheal deviation present. No thyromegaly present.  Cardiovascular: Normal rate, regular rhythm and normal heart sounds.   Respiratory: Effort normal and breath sounds normal. No respiratory distress. He has no wheezes.  GI: Soft. Bowel sounds are normal. He exhibits no distension and no mass. There is tenderness (mild diffuse, no peritoneal signs, no point tenderness). There is no rebound and no guarding.       Bowel sounds present, no hernias, flat  Musculoskeletal: Normal range of motion.  Lymphadenopathy:    He has no cervical adenopathy.  Neurological: He is alert and oriented to person, place, and time.  Skin: Skin is warm and dry.     Assessment/Plan Partial small bowel obstruction. At this point I discussed continued conservative management with the patient. We'll continue a limited by mouth status with only ice chips and sips of water at this point. Continue IV fluid hydration. Continue bowel rest. Indications for surgical intervention were discussed at length the patient. We'll continue therapeutic subcutaneous Lovenox at this point for his factor V deficiency. Patient is a moderately high risk should surgery be required. He understands and will continue conservative management at this point.  Unknown Flannigan C 08/23/2010, 2:15 PM

## 2010-08-23 NOTE — Progress Notes (Signed)
Notified Dr. Leticia Penna of pts NGT coming out and about what I had discussed with the pt.  The MD stated it was okay to leave the NGT out.

## 2010-08-23 NOTE — Progress Notes (Signed)
Clamped the  Patients NGT.  Discussed with the patient and his wife that he should call me for any abdominal discomfort such as pain, nausea,or vomiting.  I voiced to him that I would be in to check on him periodically.  Pt and family verbalized understanding.

## 2010-08-24 LAB — COMPREHENSIVE METABOLIC PANEL
ALT: 5 U/L (ref 0–53)
AST: 14 U/L (ref 0–37)
Albumin: 2.5 g/dL — ABNORMAL LOW (ref 3.5–5.2)
Alkaline Phosphatase: 58 U/L (ref 39–117)
Chloride: 104 mEq/L (ref 96–112)
Potassium: 3.5 mEq/L (ref 3.5–5.1)
Sodium: 139 mEq/L (ref 135–145)
Total Bilirubin: 0.8 mg/dL (ref 0.3–1.2)
Total Protein: 6.1 g/dL (ref 6.0–8.3)

## 2010-08-24 LAB — CBC
HCT: 40.6 % (ref 39.0–52.0)
MCH: 29.7 pg (ref 26.0–34.0)
MCHC: 33.5 g/dL (ref 30.0–36.0)
MCV: 88.6 fL (ref 78.0–100.0)
RDW: 14.6 % (ref 11.5–15.5)

## 2010-08-24 NOTE — Progress Notes (Signed)
  Subjective: Pain about the same.  +small BM.  No nausea.  Tolerating clears.  Objective: Vital signs in last 24 hours: Temp:  [97.1 F (36.2 C)-98 F (36.7 C)] 97.4 F (36.3 C) (08/09 0442) Pulse Rate:  [61-73] 61  (08/09 0442) Resp:  [18-20] 18  (08/09 0442) BP: (146-171)/(71-78) 171/78 mmHg (08/09 0442) SpO2:  [95 %-98 %] 95 % (08/09 0442) Last BM Date: 08/22/10  Intake/Output from previous day: 08/08 0701 - 08/09 0700 In: 2840 [I.V.:2840] Out: 700 [Urine:700] Intake/Output this shift:    General appearance: alert and no distress GI: +BS, soft, mild tenderness.  mild distention.  Lab Results:  @LABLAST2 (wbc:2,hgb:2,hct:2,plt:2) BMET  Basename 08/24/10 0536 08/23/10 0529  NA 139 140  K 3.5 3.3*  CL 104 104  CO2 18* 24  GLUCOSE 63* 82  BUN 15 15  CREATININE 1.04 1.32  CALCIUM 8.7 9.1   PT/INR  Basename 08/22/10 1423  LABPROT 16.1*  INR 1.26   ABG No results found for this basename: PHART:2,PCO2:2,PO2:2,HCO3:2 in the last 72 hours  Studies/Results: Dg Abd Acute W/chest  08/22/2010  *RADIOLOGY REPORT*  Clinical Data: Abdominal pain.  ACUTE ABDOMEN SERIES (ABDOMEN 2 VIEW & CHEST 1 VIEW)  Comparison: 01/08/2009  Findings: There is slight dilatation of multiple proximal small bowel loops.  Distal small bowel loops are not dilated.  This suggests partial small bowel obstruction.  The colon is not distended.  Air fluid levels are seen in the slightly distended small bowel.  There are numerous surgical staples in the left side of the abdomen.  IVC filter is in place.  14 mm bladder stone.  Dual lead pacer in place.  Heart size and vascularity are normal. Lungs are clear.  IMPRESSION:  Findings consistent with partial small bowel obstruction.  Original Report Authenticated By: Gwynn Burly, M.D.    Anti-infectives: Anti-infectives    None      Assessment/Plan: s/p  Advance dietslowly.  Increase activity.  Hep well iv   LOS: 2 days    Justin Lang  C 08/24/2010

## 2010-08-24 NOTE — Progress Notes (Signed)
Notified Dr. Leticia Penna to discuss progression of the patients care for today.  Voiced to him since his NGT out from 2:30 pm yesterday, he has had no c/o n/v, or abdominal pain.  His BS are positive in all four quadrants and his abdomen is non distended.  I asked if we could progress his diet to a clear liquid.  New orders given and followed.

## 2010-08-25 ENCOUNTER — Other Ambulatory Visit (HOSPITAL_COMMUNITY): Payer: Medicare Other

## 2010-08-25 ENCOUNTER — Encounter (HOSPITAL_COMMUNITY): Payer: Self-pay | Admitting: Internal Medicine

## 2010-08-25 LAB — CBC
HCT: 39.7 % (ref 39.0–52.0)
RBC: 4.51 MIL/uL (ref 4.22–5.81)
RDW: 14.5 % (ref 11.5–15.5)
WBC: 5 10*3/uL (ref 4.0–10.5)

## 2010-08-25 LAB — COMPREHENSIVE METABOLIC PANEL
Alkaline Phosphatase: 54 U/L (ref 39–117)
BUN: 11 mg/dL (ref 6–23)
Calcium: 8.9 mg/dL (ref 8.4–10.5)
Creatinine, Ser: 1.02 mg/dL (ref 0.50–1.35)
GFR calc Af Amer: >60 mL/min (ref >60)
Glucose, Bld: 98 mg/dL (ref 70–99)
Potassium: 3.1 mEq/L — ABNORMAL LOW (ref 3.5–5.1)
Total Protein: 6.1 g/dL (ref 6.0–8.3)

## 2010-08-25 LAB — PROTIME-INR
INR: 1.44 (ref 0.00–1.49)
Prothrombin Time: 17.8 seconds — ABNORMAL HIGH (ref 11.6–15.2)

## 2010-08-25 MED ORDER — ENOXAPARIN SODIUM 80 MG/0.8ML ~~LOC~~ SOLN: 70.0000 mg | Freq: Two times a day (BID) | SUBCUTANEOUS | Status: DC

## 2010-08-25 MED ORDER — POTASSIUM CHLORIDE CRYS ER 20 MEQ PO TBCR
40.0000 meq | EXTENDED_RELEASE_TABLET | Freq: Once | ORAL | Status: AC
  Administered 2010-08-25: 40 meq via ORAL
  Filled 2010-08-25 (×2): qty 1

## 2010-08-25 MED ORDER — HYDROCODONE-ACETAMINOPHEN 5-325 MG PO TABS: 1.0000 | ORAL_TABLET | ORAL | Status: AC | PRN

## 2010-08-25 NOTE — Progress Notes (Signed)
Patient being discharged.  IV removed with no s/s of infection.  Discharge instructions gone over with patient.  He has an appointment on 08/30/10 at the [facility] to recheck his INR.  He was encouraged to keep that and all other follow up appointments.  Patient being discharged in the company of his wife.  They are used to having his INR checked so know the importance of this follow up.

## 2010-08-25 NOTE — Discharge Summary (Signed)
Physician Discharge Summary  Patient ID: Justin Lang MRN: [id number] DOB/AGE: [birth year] 77 y.o.  Admit date: 08/22/2010 Discharge date: 08/25/2010  Admission Diagnoses:Partial SBP  Discharge Diagnoses: Resolution of SBO Active Problems:  * No active hospital problems. *    Discharged Condition: good  Hospital Course: Pt admitted with nausea and abd pain.  Kept NPO and on bowel rest.  Has resolution clinically of pSBO.  Plans made for d/c.  Consults: none  Significant Diagnostic Studies: radiology: KUB: consistent with SBO  Treatments: IV hydration and bowel rest  Discharge Exam: Blood pressure 137/64, pulse 6, temperature 97.9 F (36.6 C), temperature source Oral, resp. rate 18, height 5\' 9"  (1.753 m), weight 73.347 kg (161 lb 11.2 oz), SpO2 92.00%. General appearance: alert and no distress Eyes: conjunctivae/corneas clear. PERRL, EOM's intact. Fundi benign. Resp: clear to auscultation bilaterally Cardio: regular rate and rhythm GI: soft, non-tender; bowel sounds normal; no masses,  no organomegaly  Disposition: Home or Self Care  Discharge Orders    Future Appointments: Provider: Department: Dept Phone: Center:   08/31/2010 8:40 AM [facility] [phone] None   10/05/2010 10:00 AM Joice Lofts Caryl Bis, RN [facility] [phone] [facility]     Future Orders Please Complete By Expires   Diet - low sodium heart healthy      Increase activity slowly      Discharge instructions      Comments:   Increase activity as tolerated.   Call MD for:  temperature >100.4      Call MD for:  persistant nausea and vomiting      Call MD for:  severe uncontrolled pain      Call MD for:  redness, tenderness, or signs of infection (pain, swelling, redness, odor or green/yellow discharge around incision site)        Current Discharge Medication List    START taking these medications   Details  HYDROcodone-acetaminophen (NORCO) 5-325 MG per tablet Take 1-2  tablets by mouth every 4 (four) hours as needed for pain. Qty: 20 tablet, Refills: 0      CONTINUE these medications which have CHANGED   Details  enoxaparin (LOVENOX) 80 MG/0.8ML SOLN Inject 0.7 mLs (70 mg total) into the skin every 12 (twelve) hours. Qty: 22.4 mL, Refills: 0      CONTINUE these medications which have NOT CHANGED   Details  pantoprazole (PROTONIX) 40 MG tablet Take 1 tablet (40 mg total) by mouth daily. Qty: 30 tablet, Refills: 5    warfarin (COUMADIN) 4 MG tablet Take 7 mg by mouth daily.     acetaminophen (TYLENOL) 325 MG tablet Take 650 mg by mouth every 6 (six) hours as needed.     guar gum packet Take 1 packet by mouth at bedtime. Qty: 300 g, Refills: 12    peg 3350 powder (MOVIPREP) 100 G SOLR Take 1 kit (100 g total) by mouth once. Qty: 1 kit, Refills: 0      STOP taking these medications     loperamide (IMODIUM A-D) 2 MG tablet        Follow-up Information    Follow up with [facility] on 08/30/2010. (For INR check)    Contact information:   [address] [place] [address]          Signed: Fabio Bering 08/25/2010, 11:40 AM

## 2010-08-30 ENCOUNTER — Telehealth (HOSPITAL_COMMUNITY): Payer: Self-pay | Admitting: *Deleted

## 2010-08-30 ENCOUNTER — Other Ambulatory Visit (HOSPITAL_COMMUNITY): Payer: Self-pay | Admitting: Oncology

## 2010-08-30 ENCOUNTER — Encounter (HOSPITAL_COMMUNITY): Payer: Medicare Other | Attending: Oncology

## 2010-08-30 DIAGNOSIS — D682 Hereditary deficiency of other clotting factors: Secondary | ICD-10-CM

## 2010-08-30 DIAGNOSIS — Z86718 Personal history of other venous thrombosis and embolism: Secondary | ICD-10-CM

## 2010-08-30 LAB — PROTIME-INR: Prothrombin Time: 17.8 seconds — ABNORMAL HIGH (ref 11.6–15.2)

## 2010-08-30 NOTE — Telephone Encounter (Signed)
Message copied by Dennie Maizes on Wed Aug 30, 2010  4:30 PM ------      Message from: Ellouise Newer III      Created: Wed Aug 30, 2010  2:58 PM       Increase Coumadin to 7mg  alternating with 8 mg

## 2010-08-30 NOTE — Telephone Encounter (Signed)
Spoke with Justin Lang,instructed to change coumadin to 8mg  tonight alternating with 7mg . She understands coumadin instructions. She is concerned that originally Dr.Neijstrom ordered Lovenox 40mg  daily but reports today that Dr.Zeigler called in Lovenox 80mg . Reports she has given Lovenox 80mg  daily x 2 days but states Dr.Zeigler told them to do it BID. She is confused and needs for either Dr.Neijstrom or Tom to call her and clarify Lovenox.

## 2010-08-30 NOTE — Progress Notes (Signed)
Labs drawn today for pt.  Patient on 7mg  coud. And 1 shot a day  Call patient at [phone]

## 2010-08-30 NOTE — Telephone Encounter (Signed)
I discussed this note with Justin Lang who called the [facility] before she left today.

## 2010-08-31 ENCOUNTER — Other Ambulatory Visit (HOSPITAL_COMMUNITY): Payer: Medicare Other

## 2010-09-01 ENCOUNTER — Encounter (HOSPITAL_COMMUNITY): Payer: Medicare Other

## 2010-09-01 DIAGNOSIS — Z86718 Personal history of other venous thrombosis and embolism: Secondary | ICD-10-CM

## 2010-09-01 DIAGNOSIS — D682 Hereditary deficiency of other clotting factors: Secondary | ICD-10-CM

## 2010-09-01 LAB — PROTIME-INR: Prothrombin Time: 20.7 seconds — ABNORMAL HIGH (ref 11.6–15.2)

## 2010-09-01 NOTE — Progress Notes (Signed)
Labs drawn today for pt.  Patient on 7mg  and 8mg  alternating.  Call patient at [phone]

## 2010-09-04 ENCOUNTER — Other Ambulatory Visit (HOSPITAL_COMMUNITY): Payer: Medicare Other

## 2010-09-05 ENCOUNTER — Other Ambulatory Visit (HOSPITAL_COMMUNITY): Payer: Self-pay | Admitting: *Deleted

## 2010-09-05 ENCOUNTER — Telehealth (HOSPITAL_COMMUNITY): Payer: Self-pay | Admitting: *Deleted

## 2010-09-05 DIAGNOSIS — D682 Hereditary deficiency of other clotting factors: Secondary | ICD-10-CM

## 2010-09-05 NOTE — Telephone Encounter (Signed)
Pt instructed to continue current coumadin dose of 7/8 and to return Friday 8/24 for INR. He verbalized understanding.

## 2010-09-08 ENCOUNTER — Encounter (HOSPITAL_BASED_OUTPATIENT_CLINIC_OR_DEPARTMENT_OTHER): Payer: Medicare Other

## 2010-09-08 ENCOUNTER — Telehealth (HOSPITAL_COMMUNITY): Payer: Self-pay

## 2010-09-08 DIAGNOSIS — D682 Hereditary deficiency of other clotting factors: Secondary | ICD-10-CM

## 2010-09-08 NOTE — Telephone Encounter (Signed)
Wife notified for Justin Lang to continue the same dosage of coumadin and to RTC 8/31 for next PT/INR.

## 2010-09-08 NOTE — Progress Notes (Signed)
Labs drawn today for pt.  Patient in 7mg  and 8mg  alternating.  Call patient at [phone]

## 2010-09-15 ENCOUNTER — Encounter (HOSPITAL_BASED_OUTPATIENT_CLINIC_OR_DEPARTMENT_OTHER): Payer: Medicare Other

## 2010-09-15 ENCOUNTER — Telehealth (HOSPITAL_COMMUNITY): Payer: Self-pay | Admitting: *Deleted

## 2010-09-15 ENCOUNTER — Other Ambulatory Visit (HOSPITAL_COMMUNITY): Payer: Self-pay | Admitting: Oncology

## 2010-09-15 DIAGNOSIS — D682 Hereditary deficiency of other clotting factors: Secondary | ICD-10-CM

## 2010-09-15 DIAGNOSIS — Z86718 Personal history of other venous thrombosis and embolism: Secondary | ICD-10-CM

## 2010-09-15 DIAGNOSIS — Z95818 Presence of other cardiac implants and grafts: Secondary | ICD-10-CM

## 2010-09-15 LAB — PROTIME-INR
INR: 1.91 — ABNORMAL HIGH (ref 0.00–1.49)
Prothrombin Time: 22.2 seconds — ABNORMAL HIGH (ref 11.6–15.2)

## 2010-09-15 NOTE — Telephone Encounter (Signed)
Message copied by Dennie Maizes on Fri Sep 15, 2010  4:26 PM ------      Message from: Ellouise Newer III      Created: Fri Sep 15, 2010 11:46 AM       Same dose            PT/INR in 1 week.

## 2010-09-15 NOTE — Progress Notes (Signed)
Labs drawn today for 7 and 8 mg alternating.  Call patient at [phone]

## 2010-09-15 NOTE — Telephone Encounter (Signed)
Spoke with [name], relayed message as below. Verbalized understanding.

## 2010-09-22 ENCOUNTER — Other Ambulatory Visit (HOSPITAL_COMMUNITY): Payer: Self-pay | Admitting: Oncology

## 2010-09-22 ENCOUNTER — Encounter (HOSPITAL_COMMUNITY): Payer: Medicare Other | Attending: Oncology

## 2010-09-22 ENCOUNTER — Telehealth (HOSPITAL_COMMUNITY): Payer: Self-pay

## 2010-09-22 DIAGNOSIS — D682 Hereditary deficiency of other clotting factors: Secondary | ICD-10-CM | POA: Insufficient documentation

## 2010-09-22 DIAGNOSIS — Z86718 Personal history of other venous thrombosis and embolism: Secondary | ICD-10-CM | POA: Insufficient documentation

## 2010-09-22 DIAGNOSIS — Z95818 Presence of other cardiac implants and grafts: Secondary | ICD-10-CM

## 2010-09-22 LAB — PROTIME-INR: INR: 2.05 — ABNORMAL HIGH (ref 0.00–1.49)

## 2010-09-22 NOTE — Progress Notes (Signed)
Labs drawn today for pt.  Patient on 7 and 8mg alternating.  Call patient at [phone] 

## 2010-09-22 NOTE — Telephone Encounter (Signed)
Message left to continue same dosage of coumadin and to return to clinic on 9/17 for PT level.

## 2010-10-02 ENCOUNTER — Encounter (HOSPITAL_COMMUNITY): Payer: Medicare Other

## 2010-10-02 ENCOUNTER — Telehealth (HOSPITAL_COMMUNITY): Payer: Self-pay | Admitting: *Deleted

## 2010-10-02 ENCOUNTER — Other Ambulatory Visit (HOSPITAL_COMMUNITY): Payer: Self-pay | Admitting: Oncology

## 2010-10-02 DIAGNOSIS — D682 Hereditary deficiency of other clotting factors: Secondary | ICD-10-CM

## 2010-10-02 NOTE — Telephone Encounter (Signed)
Spoke with Judy,instruct to continue same dose Coumadin. [name] states pt alternating 7mg  and 8mg  every other day. RTC in 10 days for PT level. Verbalized understanding.

## 2010-10-02 NOTE — Progress Notes (Signed)
Labs drawn today for pt.  Patient on 7 and 8mg  alternating.  Call patient at [phone]

## 2010-10-02 NOTE — Telephone Encounter (Signed)
Message copied by Dennie Maizes on Mon Oct 02, 2010  1:54 PM ------      Message from: Ellouise Newer III      Created: Mon Oct 02, 2010  1:11 PM       Same dose            PT/INR in 10 days

## 2010-10-04 LAB — PROTIME-INR
INR: 1.9 — ABNORMAL HIGH
Prothrombin Time: 22.5 — ABNORMAL HIGH

## 2010-10-05 ENCOUNTER — Encounter: Payer: Medicare Other | Admitting: *Deleted

## 2010-10-06 LAB — PROTIME-INR: Prothrombin Time: 30.4 — ABNORMAL HIGH

## 2010-10-08 ENCOUNTER — Encounter: Payer: Self-pay | Admitting: *Deleted

## 2010-10-09 LAB — PROTIME-INR
INR: 2.7 — ABNORMAL HIGH
Prothrombin Time: 30.1 — ABNORMAL HIGH

## 2010-10-10 LAB — PROTIME-INR: Prothrombin Time: 36.5 — ABNORMAL HIGH

## 2010-10-11 ENCOUNTER — Other Ambulatory Visit (HOSPITAL_COMMUNITY): Payer: Self-pay | Admitting: Oncology

## 2010-10-11 ENCOUNTER — Encounter (HOSPITAL_BASED_OUTPATIENT_CLINIC_OR_DEPARTMENT_OTHER): Payer: Medicare Other

## 2010-10-11 ENCOUNTER — Telehealth (HOSPITAL_COMMUNITY): Payer: Self-pay

## 2010-10-11 DIAGNOSIS — D682 Hereditary deficiency of other clotting factors: Secondary | ICD-10-CM

## 2010-10-11 LAB — PROTIME-INR
INR: 1.7 — ABNORMAL HIGH
INR: 2.1 — ABNORMAL HIGH
Prothrombin Time: 20.3 — ABNORMAL HIGH

## 2010-10-11 NOTE — Telephone Encounter (Signed)
Notes Recorded by Dellis Anes, PA on 10/11/2010 at 4:09 PM Same dose  PT/INR in 10 days  1700 Spoke with wife - Algernon Huxley to continue same dosage of coumadin and RTC 10/20/10 for PT/INR./KSB

## 2010-10-11 NOTE — Progress Notes (Signed)
Labs drawn today for pt.  Patient on 7mg and 8mg alternating.  Call patient at [phone] 

## 2010-10-12 LAB — PROTIME-INR
INR: 2.9 — ABNORMAL HIGH
Prothrombin Time: 31.6 — ABNORMAL HIGH

## 2010-10-13 ENCOUNTER — Encounter: Payer: Self-pay | Admitting: Internal Medicine

## 2010-10-13 ENCOUNTER — Ambulatory Visit (INDEPENDENT_AMBULATORY_CARE_PROVIDER_SITE_OTHER): Payer: Medicare Other | Admitting: *Deleted

## 2010-10-13 ENCOUNTER — Other Ambulatory Visit: Payer: Self-pay | Admitting: Internal Medicine

## 2010-10-13 DIAGNOSIS — I495 Sick sinus syndrome: Secondary | ICD-10-CM

## 2010-10-13 DIAGNOSIS — Z950 Presence of cardiac pacemaker: Secondary | ICD-10-CM

## 2010-10-13 LAB — REMOTE PACEMAKER DEVICE
AL IMPEDENCE PM: 408 Ohm
BATTERY VOLTAGE: 2.99 V
RV LEAD AMPLITUDE: 4.9 mv
VENTRICULAR PACING PM: 2

## 2010-10-13 LAB — PROTIME-INR: INR: 3.2 — ABNORMAL HIGH

## 2010-10-16 LAB — INTRINSIC FACTOR ANTIBODIES: Intrinsic Factor: NEGATIVE

## 2010-10-16 LAB — CBC
HCT: 42.3
Hemoglobin: 14.4
MCV: 90.5
Platelets: 230
RBC: 4.67
WBC: 7.5

## 2010-10-16 LAB — DIFFERENTIAL
Basophils Absolute: 0
Basophils Relative: 1
Eosinophils Absolute: 0.3
Monocytes Relative: 7
Neutro Abs: 5.5
Neutrophils Relative %: 73

## 2010-10-16 LAB — COMPREHENSIVE METABOLIC PANEL
ALT: 12
Alkaline Phosphatase: 57
BUN: 13
CO2: 29
Chloride: 106
Glucose, Bld: 97
Potassium: 3.6
Sodium: 141
Total Bilirubin: 0.5
Total Protein: 6.4

## 2010-10-16 LAB — PROTIME-INR
INR: 2.7 — ABNORMAL HIGH
Prothrombin Time: 30.9 — ABNORMAL HIGH

## 2010-10-17 LAB — VITAMIN B12: Vitamin B-12: >2000 pg/mL — ABNORMAL HIGH (ref 211–911)

## 2010-10-18 ENCOUNTER — Encounter: Payer: Self-pay | Admitting: *Deleted

## 2010-10-18 LAB — PROTIME-INR: INR: 2.2 — ABNORMAL HIGH

## 2010-10-19 LAB — FECAL LACTOFERRIN, QUANT: Fecal Lactoferrin: POSITIVE

## 2010-10-19 LAB — STOOL CULTURE

## 2010-10-19 LAB — OVA AND PARASITE EXAMINATION: Ova and parasites: NONE SEEN

## 2010-10-19 LAB — PROTIME-INR: INR: 2.4 — ABNORMAL HIGH (ref 0.00–1.49)

## 2010-10-19 LAB — CLOSTRIDIUM DIFFICILE EIA: C difficile Toxins A+B, EIA: NEGATIVE

## 2010-10-19 NOTE — Progress Notes (Signed)
Pacer remote check  

## 2010-10-20 ENCOUNTER — Other Ambulatory Visit (HOSPITAL_COMMUNITY): Payer: Self-pay

## 2010-10-20 ENCOUNTER — Telehealth (HOSPITAL_COMMUNITY): Payer: Self-pay

## 2010-10-20 ENCOUNTER — Encounter (HOSPITAL_COMMUNITY): Payer: Medicare Other | Attending: Oncology

## 2010-10-20 DIAGNOSIS — D682 Hereditary deficiency of other clotting factors: Secondary | ICD-10-CM | POA: Insufficient documentation

## 2010-10-20 DIAGNOSIS — Z95818 Presence of other cardiac implants and grafts: Secondary | ICD-10-CM | POA: Insufficient documentation

## 2010-10-20 DIAGNOSIS — Z86718 Personal history of other venous thrombosis and embolism: Secondary | ICD-10-CM

## 2010-10-20 NOTE — Progress Notes (Signed)
Labs drawn today for pt.  Patient on 7mg  and 8mg  alternating.  Call patient at [phone]

## 2010-10-20 NOTE — Telephone Encounter (Signed)
Spoke with Justin Lang, instructed that Justin Lang should increase coumadin to 8 mg daily and RTC 10/12 for next pt level.

## 2010-10-23 LAB — PROTIME-INR
INR: 2.8 — ABNORMAL HIGH
Prothrombin Time: 30.7 — ABNORMAL HIGH

## 2010-10-24 LAB — PROTIME-INR
INR: 2.1 — ABNORMAL HIGH
Prothrombin Time: 24.2 — ABNORMAL HIGH

## 2010-10-26 LAB — PROTIME-INR
INR: 2.1 — ABNORMAL HIGH
Prothrombin Time: 24.7 — ABNORMAL HIGH

## 2010-10-27 ENCOUNTER — Other Ambulatory Visit (HOSPITAL_COMMUNITY): Payer: Self-pay | Admitting: Oncology

## 2010-10-27 ENCOUNTER — Encounter (HOSPITAL_BASED_OUTPATIENT_CLINIC_OR_DEPARTMENT_OTHER): Payer: Medicare Other

## 2010-10-27 DIAGNOSIS — Z86718 Personal history of other venous thrombosis and embolism: Secondary | ICD-10-CM

## 2010-10-27 DIAGNOSIS — Z95818 Presence of other cardiac implants and grafts: Secondary | ICD-10-CM

## 2010-10-27 DIAGNOSIS — D682 Hereditary deficiency of other clotting factors: Secondary | ICD-10-CM

## 2010-10-27 LAB — PROTIME-INR
Prothrombin Time: 26.4 seconds — ABNORMAL HIGH (ref 11.6–15.2)
Prothrombin Time: 30 — ABNORMAL HIGH

## 2010-10-27 LAB — COMPREHENSIVE METABOLIC PANEL
ALT: 10
AST: 17
CO2: 26
Chloride: 109
Creatinine, Ser: 1.2
GFR calc Af Amer: >60
GFR calc non Af Amer: 58 — ABNORMAL LOW
Glucose, Bld: 89
Total Bilirubin: 0.7

## 2010-10-27 LAB — CBC
HCT: 42
Hemoglobin: 14.2
MCHC: 33.9
RBC: 4.77

## 2010-10-27 NOTE — Progress Notes (Signed)
Notes Recorded by Dellis Anes, PA on 10/27/2010 at 9:52 AM Same dose  PT/INR in 1 week.  Above information given to wife.  Verbalizes understanding.

## 2010-10-27 NOTE — Progress Notes (Signed)
Labs drawn today for pt.  Patient on 8mg.  Call patient at [phone] 

## 2010-10-31 LAB — PROTIME-INR: Prothrombin Time: 29.7 — ABNORMAL HIGH

## 2010-11-02 ENCOUNTER — Encounter (HOSPITAL_BASED_OUTPATIENT_CLINIC_OR_DEPARTMENT_OTHER): Payer: Medicare Other

## 2010-11-02 ENCOUNTER — Other Ambulatory Visit (HOSPITAL_COMMUNITY): Payer: Self-pay | Admitting: Oncology

## 2010-11-02 DIAGNOSIS — D682 Hereditary deficiency of other clotting factors: Secondary | ICD-10-CM

## 2010-11-02 DIAGNOSIS — Z86718 Personal history of other venous thrombosis and embolism: Secondary | ICD-10-CM

## 2010-11-02 DIAGNOSIS — Z95818 Presence of other cardiac implants and grafts: Secondary | ICD-10-CM

## 2010-11-02 LAB — PROTIME-INR
INR: 2.5 — ABNORMAL HIGH
Prothrombin Time: 28.5 — ABNORMAL HIGH

## 2010-11-02 NOTE — Progress Notes (Signed)
Labs drawn today for pt.  Patient on 8mg  of coud.  Call patient at [phone]

## 2010-11-02 NOTE — Progress Notes (Signed)
Pt's wife notified of inr being stable and to continue same dose. Also to return in 10 days Oct 29th @ 9:20 for inr. She verbalized understanding of all instructions.

## 2010-11-13 ENCOUNTER — Other Ambulatory Visit (HOSPITAL_COMMUNITY): Payer: Self-pay | Admitting: Oncology

## 2010-11-13 ENCOUNTER — Encounter (HOSPITAL_BASED_OUTPATIENT_CLINIC_OR_DEPARTMENT_OTHER): Payer: Medicare Other

## 2010-11-13 DIAGNOSIS — D682 Hereditary deficiency of other clotting factors: Secondary | ICD-10-CM

## 2010-11-13 DIAGNOSIS — Z86718 Personal history of other venous thrombosis and embolism: Secondary | ICD-10-CM

## 2010-11-13 LAB — PROTIME-INR: INR: 2.19 — ABNORMAL HIGH (ref 0.00–1.49)

## 2010-11-13 NOTE — Progress Notes (Signed)
Labs drawn today for pt.  Patient on 8mg  coud qd.  Call patient at [phone]

## 2010-11-13 NOTE — Progress Notes (Signed)
Same dose coumadin. Return in 2 weeks for INR. 11/12 @ 8:50. Coumadin 1 mg tabs called into [facility] per family request. Wife verbalized understanding of all instructions.

## 2010-11-27 ENCOUNTER — Other Ambulatory Visit (HOSPITAL_COMMUNITY): Payer: Self-pay | Admitting: Oncology

## 2010-11-27 ENCOUNTER — Encounter (HOSPITAL_COMMUNITY): Payer: Medicare Other | Attending: Oncology

## 2010-11-27 DIAGNOSIS — Z86718 Personal history of other venous thrombosis and embolism: Secondary | ICD-10-CM | POA: Insufficient documentation

## 2010-11-27 DIAGNOSIS — D682 Hereditary deficiency of other clotting factors: Secondary | ICD-10-CM

## 2010-11-27 DIAGNOSIS — Z95818 Presence of other cardiac implants and grafts: Secondary | ICD-10-CM | POA: Insufficient documentation

## 2010-11-27 LAB — PROTIME-INR: Prothrombin Time: 19.2 seconds — ABNORMAL HIGH (ref 11.6–15.2)

## 2010-11-27 NOTE — Progress Notes (Signed)
Labs drawn today for pt.  Patient on 8mg of coud.  Call patient at [phone] 

## 2010-12-04 ENCOUNTER — Other Ambulatory Visit (HOSPITAL_COMMUNITY): Payer: Self-pay

## 2010-12-04 ENCOUNTER — Encounter (HOSPITAL_BASED_OUTPATIENT_CLINIC_OR_DEPARTMENT_OTHER): Payer: Medicare Other

## 2010-12-04 DIAGNOSIS — Z95818 Presence of other cardiac implants and grafts: Secondary | ICD-10-CM

## 2010-12-04 DIAGNOSIS — Z86718 Personal history of other venous thrombosis and embolism: Secondary | ICD-10-CM

## 2010-12-04 DIAGNOSIS — D682 Hereditary deficiency of other clotting factors: Secondary | ICD-10-CM

## 2010-12-04 NOTE — Progress Notes (Signed)
Labs drawn today for pt.  Patient on 8mg  a day.  Call patient at [phone]

## 2010-12-13 ENCOUNTER — Encounter (HOSPITAL_BASED_OUTPATIENT_CLINIC_OR_DEPARTMENT_OTHER): Payer: Medicare Other

## 2010-12-13 DIAGNOSIS — Z95818 Presence of other cardiac implants and grafts: Secondary | ICD-10-CM

## 2010-12-13 DIAGNOSIS — D682 Hereditary deficiency of other clotting factors: Secondary | ICD-10-CM

## 2010-12-13 DIAGNOSIS — Z86718 Personal history of other venous thrombosis and embolism: Secondary | ICD-10-CM

## 2010-12-13 NOTE — Progress Notes (Signed)
Labs drawn today for pt.  Patient on 8mg .  Call patient at [phone]

## 2010-12-20 ENCOUNTER — Other Ambulatory Visit (HOSPITAL_COMMUNITY): Payer: Self-pay | Admitting: Oncology

## 2010-12-20 ENCOUNTER — Encounter (HOSPITAL_COMMUNITY): Payer: Medicare Other | Attending: Oncology

## 2010-12-20 DIAGNOSIS — R413 Other amnesia: Secondary | ICD-10-CM | POA: Insufficient documentation

## 2010-12-20 DIAGNOSIS — D682 Hereditary deficiency of other clotting factors: Secondary | ICD-10-CM | POA: Insufficient documentation

## 2010-12-20 DIAGNOSIS — Z86718 Personal history of other venous thrombosis and embolism: Secondary | ICD-10-CM

## 2010-12-20 LAB — PROTIME-INR: INR: 2.42 — ABNORMAL HIGH (ref 0.00–1.49)

## 2010-12-20 NOTE — Progress Notes (Signed)
Labs drawn today for pt.  Patient on 8mg of coud.  Call patient at [phone] 

## 2010-12-29 ENCOUNTER — Other Ambulatory Visit (HOSPITAL_COMMUNITY): Payer: Self-pay | Admitting: Oncology

## 2010-12-29 ENCOUNTER — Encounter (HOSPITAL_BASED_OUTPATIENT_CLINIC_OR_DEPARTMENT_OTHER): Payer: Medicare Other

## 2010-12-29 DIAGNOSIS — D682 Hereditary deficiency of other clotting factors: Secondary | ICD-10-CM

## 2010-12-29 DIAGNOSIS — Z86718 Personal history of other venous thrombosis and embolism: Secondary | ICD-10-CM

## 2010-12-29 LAB — PROTIME-INR
INR: 2.91 — ABNORMAL HIGH (ref 0.00–1.49)
Prothrombin Time: 30.9 seconds — ABNORMAL HIGH (ref 11.6–15.2)

## 2010-12-29 NOTE — Progress Notes (Signed)
Labs drawn today for pt.  Patient on 8mg.  Call patient at [phone] 

## 2011-01-02 ENCOUNTER — Encounter (HOSPITAL_BASED_OUTPATIENT_CLINIC_OR_DEPARTMENT_OTHER): Payer: Medicare Other

## 2011-01-02 ENCOUNTER — Other Ambulatory Visit (HOSPITAL_COMMUNITY): Payer: Self-pay | Admitting: Oncology

## 2011-01-02 DIAGNOSIS — D682 Hereditary deficiency of other clotting factors: Secondary | ICD-10-CM

## 2011-01-02 DIAGNOSIS — Z86718 Personal history of other venous thrombosis and embolism: Secondary | ICD-10-CM

## 2011-01-02 LAB — PROTIME-INR
INR: 2.67 — ABNORMAL HIGH (ref 0.00–1.49)
Prothrombin Time: 28.9 seconds — ABNORMAL HIGH (ref 11.6–15.2)

## 2011-01-02 NOTE — Progress Notes (Signed)
Labs drawn today for pt.  Patient on 8mg.  Call patient at [phone] 

## 2011-01-05 ENCOUNTER — Encounter (HOSPITAL_COMMUNITY): Payer: Self-pay | Admitting: Oncology

## 2011-01-05 ENCOUNTER — Encounter (HOSPITAL_BASED_OUTPATIENT_CLINIC_OR_DEPARTMENT_OTHER): Payer: Medicare Other | Admitting: Oncology

## 2011-01-05 VITALS — BP 145/71 | HR 61 | Temp 97.5°F | Wt 164.2 lb

## 2011-01-05 DIAGNOSIS — R413 Other amnesia: Secondary | ICD-10-CM

## 2011-01-05 DIAGNOSIS — Z7901 Long term (current) use of anticoagulants: Secondary | ICD-10-CM

## 2011-01-05 DIAGNOSIS — Z86718 Personal history of other venous thrombosis and embolism: Secondary | ICD-10-CM

## 2011-01-05 DIAGNOSIS — Z85038 Personal history of other malignant neoplasm of large intestine: Secondary | ICD-10-CM

## 2011-01-05 DIAGNOSIS — D682 Hereditary deficiency of other clotting factors: Secondary | ICD-10-CM

## 2011-01-05 LAB — CBC
MCHC: 32.6 g/dL (ref 30.0–36.0)
Platelets: 252 10*3/uL (ref 150–400)
RDW: 14.6 % (ref 11.5–15.5)

## 2011-01-05 LAB — FERRITIN: Ferritin: 158 ng/mL (ref 22–322)

## 2011-01-05 LAB — COMPREHENSIVE METABOLIC PANEL
BUN: 17 mg/dL (ref 6–23)
CO2: 28 mEq/L (ref 19–32)
Calcium: 9 mg/dL (ref 8.4–10.5)
Chloride: 103 mEq/L (ref 96–112)
Creatinine, Ser: 1.5 mg/dL — ABNORMAL HIGH (ref 0.50–1.35)
GFR calc Af Amer: 47 mL/min — ABNORMAL LOW (ref >90)
GFR calc non Af Amer: 40 mL/min — ABNORMAL LOW (ref >90)
Glucose, Bld: 93 mg/dL (ref 70–99)
Total Bilirubin: 0.7 mg/dL (ref 0.3–1.2)

## 2011-01-05 LAB — VITAMIN B12: Vitamin B-12: 215 pg/mL (ref 211–911)

## 2011-01-05 LAB — TSH: TSH: 2.049 u[IU]/mL (ref 0.350–4.500)

## 2011-01-05 LAB — FOLATE: Folate: 17.1 ng/mL

## 2011-01-05 NOTE — Progress Notes (Signed)
CT-scan of the abdomen

## 2011-01-05 NOTE — Progress Notes (Signed)
CC:   Catalina Pizza, M.D.  DIAGNOSES: 1. Recent onset of memory loss. 2. Metastatic recurrent colon cancer, status post chemotherapy and     radiation therapy preoperatively prior to his resection at [facility] on     12/10/2000, thus far without recurrence. 3. 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 4. Small bowel obstruction intermittently with surgery by Dr. Graylin Shiver most recently in April 2003. 5. Deep venous thrombosis and pulmonary emboli on multiple occasions     on lifelong Coumadin with a negative familial hypercoagulable     workup.  Presently he is on 8 mg of Coumadin a day.  He has had     some variation from therapeutic, so he has been checked a little     bit more frequently lately. 6. History of colon cancer diagnosed originally in 1997, with an     attempt at 5-FU-based chemotherapy, which he could not tolerate nor     could he tolerate it when he had a recurrence. 7. Renal stone disease in the past. 8. Herpes zoster, L5-S1 September 2009. 9. History of multiple intermittent small bowel obstructions and     surgery. 10.Bilateral knee replacements. 11.Bilateral carotid sensitivity syndrome with syncope from age 31 to     age 12 with a negative workup at [facility], since with resolution. 12.Frequency of diarrhea intermittently. 13.Headache from an infected tooth in the past, requiring a root canal     that has resolved. Justin Lang is accompanied by his wife today and the only complaint they have is that he cannot remember things like he used to.  It is all recent memory.  His memory tests consisted of serial 7's, which he got 3/7 wrong.  He had no idea who the names of the [place] Senators from [place] were.  He did know the vice president's name.  He knew the date.  He knew where he was.  He has not gotten lost.  He does not balance his checkbook, never has, so that is not an issue.  He is still driving, but his family and his wife in particular, all the  children and her notice that he just cannot remember things that just took place within the last few hours to a few days.  REVIEW OF SYSTEMS:  Otherwise is not new or different and I did not examine him further except for the memory test.  So we are going to check a number of blood tests and set him up with a neurologist for consultation.  He, of course, is 77 years old now and certainly has a possibility for Alzheimer's.  With all of his bowel issues and bowel resections, I need to make sure he does not have B12 or folic acid deficiency and adequate absorption.  We will also check his TSH.  We will see him back one way or the other in a few months, but we will see what his INR is today as well.    ______________________________ Ladona Horns. Mariel Sleet, MD ESN/MEDQ  D:  01/05/2011  T:  01/05/2011  Job:  [id number]

## 2011-01-05 NOTE — Patient Instructions (Signed)
Justin Lang  [id number] 03/27/1922   [facility]  Discharge Instructions  RECOMMENDATIONS MADE BY THE CONSULTANT AND ANY TEST RESULTS WILL BE SENT TO YOUR REFERRING DOCTOR.   EXAM FINDINGS BY MD TODAY AND SIGNS AND SYMPTOMS TO REPORT TO CLINIC OR PRIMARY MD: Will do some labs today to see if there is an imbalance that can be corrected and will also check your PT level and hopefully you won't have to come back next week.  We will let you know if there is anything abnormal.  MEDICATIONS PRESCRIBED: none   INSTRUCTIONS GIVEN AND DISCUSSED: Other :  Report any unusual shortness of breath, lumps, bone pain, changes in bowel habits.  SPECIAL INSTRUCTIONS/FOLLOW-UP: 4 months to see Dr. Mariel Sleet   I acknowledge that I have been informed and understand all the instructions given to me and received a copy. I do not have any more questions at this time, but understand that I may call the [facility] at [facility] at [phone] during business hours should I have any further questions or need assistance in obtaining follow-up care.    __________________________________________  _____________  __________ Signature of Patient or Authorized Representative            Date                   Time    __________________________________________ Nurse's Signature

## 2011-01-06 LAB — IRON AND TIBC: UIBC: 117 ug/dL — ABNORMAL LOW (ref 125–400)

## 2011-01-10 ENCOUNTER — Other Ambulatory Visit (HOSPITAL_COMMUNITY): Payer: Self-pay | Admitting: *Deleted

## 2011-01-10 DIAGNOSIS — E538 Deficiency of other specified B group vitamins: Secondary | ICD-10-CM

## 2011-01-10 MED ORDER — CYANOCOBALAMIN 1000 MCG/ML IJ SOLN: 1000.0000 ug | Freq: Once | INTRAMUSCULAR | Status: DC

## 2011-01-11 ENCOUNTER — Encounter: Payer: Self-pay | Admitting: Internal Medicine

## 2011-01-11 ENCOUNTER — Other Ambulatory Visit: Payer: Self-pay | Admitting: Internal Medicine

## 2011-01-11 ENCOUNTER — Ambulatory Visit (INDEPENDENT_AMBULATORY_CARE_PROVIDER_SITE_OTHER): Payer: Medicare Other | Admitting: *Deleted

## 2011-01-11 DIAGNOSIS — I495 Sick sinus syndrome: Secondary | ICD-10-CM

## 2011-01-11 DIAGNOSIS — Z950 Presence of cardiac pacemaker: Secondary | ICD-10-CM

## 2011-01-12 ENCOUNTER — Encounter (HOSPITAL_COMMUNITY): Payer: Medicare Other

## 2011-01-12 ENCOUNTER — Other Ambulatory Visit (HOSPITAL_COMMUNITY): Payer: Self-pay | Admitting: Oncology

## 2011-01-12 ENCOUNTER — Encounter (HOSPITAL_BASED_OUTPATIENT_CLINIC_OR_DEPARTMENT_OTHER): Payer: Medicare Other

## 2011-01-12 ENCOUNTER — Other Ambulatory Visit (HOSPITAL_COMMUNITY): Payer: Medicare Other

## 2011-01-12 DIAGNOSIS — D682 Hereditary deficiency of other clotting factors: Secondary | ICD-10-CM

## 2011-01-12 DIAGNOSIS — Z85038 Personal history of other malignant neoplasm of large intestine: Secondary | ICD-10-CM

## 2011-01-12 DIAGNOSIS — Z86718 Personal history of other venous thrombosis and embolism: Secondary | ICD-10-CM

## 2011-01-12 DIAGNOSIS — K909 Intestinal malabsorption, unspecified: Secondary | ICD-10-CM

## 2011-01-12 LAB — PROTIME-INR: Prothrombin Time: 24.6 seconds — ABNORMAL HIGH (ref 11.6–15.2)

## 2011-01-12 LAB — REMOTE PACEMAKER DEVICE
AL AMPLITUDE: 3.9 mv
AL IMPEDENCE PM: 520 Ohm
BAMS-0001: 170 {beats}/min
RV LEAD AMPLITUDE: 5.2 mv
RV LEAD IMPEDENCE PM: 432 Ohm

## 2011-01-12 MED ORDER — CYANOCOBALAMIN 1000 MCG/ML IJ SOLN
INTRAMUSCULAR | Status: AC
  Administered 2011-01-12: 1000 ug
  Filled 2011-01-12: qty 1

## 2011-01-12 NOTE — Progress Notes (Signed)
Labs drawn today for pt.  Patient on 8mg  and 9mg  alternating.  Call patient at [phone]

## 2011-01-12 NOTE — Progress Notes (Signed)
Justin Lang presents today for injection per MD orders. B12 1000 administered IM in left Upper Arm. Administration without incident. Patient tolerated well.

## 2011-01-12 NOTE — Progress Notes (Signed)
Continue same dose of coumadin. REturn in 2 weeks. Wife verbalized understanding.

## 2011-01-18 NOTE — Progress Notes (Signed)
Remote pacer check  

## 2011-01-19 ENCOUNTER — Encounter (HOSPITAL_COMMUNITY): Payer: Medicare Other | Attending: Oncology

## 2011-01-19 VITALS — BP 162/78 | HR 73

## 2011-01-19 DIAGNOSIS — D682 Hereditary deficiency of other clotting factors: Secondary | ICD-10-CM | POA: Insufficient documentation

## 2011-01-19 DIAGNOSIS — E538 Deficiency of other specified B group vitamins: Secondary | ICD-10-CM | POA: Insufficient documentation

## 2011-01-19 DIAGNOSIS — Z86718 Personal history of other venous thrombosis and embolism: Secondary | ICD-10-CM | POA: Insufficient documentation

## 2011-01-19 MED ORDER — CYANOCOBALAMIN 1000 MCG/ML IJ SOLN
1000.0000 ug | Freq: Once | INTRAMUSCULAR | Status: AC
  Administered 2011-01-19: 1000 ug via INTRAMUSCULAR

## 2011-01-19 NOTE — Progress Notes (Signed)
Justin Lang presents today for injection per MD orders. B12 1000 mcg administered  IM in right Upper Arm. Administration without incident. Patient tolerated well.

## 2011-01-26 ENCOUNTER — Telehealth (HOSPITAL_COMMUNITY): Payer: Self-pay | Admitting: *Deleted

## 2011-01-26 ENCOUNTER — Other Ambulatory Visit (HOSPITAL_COMMUNITY): Payer: Self-pay | Admitting: Oncology

## 2011-01-26 ENCOUNTER — Encounter (HOSPITAL_COMMUNITY): Payer: Medicare Other

## 2011-01-26 ENCOUNTER — Encounter (HOSPITAL_BASED_OUTPATIENT_CLINIC_OR_DEPARTMENT_OTHER): Payer: Medicare Other

## 2011-01-26 DIAGNOSIS — E538 Deficiency of other specified B group vitamins: Secondary | ICD-10-CM

## 2011-01-26 DIAGNOSIS — Z86718 Personal history of other venous thrombosis and embolism: Secondary | ICD-10-CM

## 2011-01-26 DIAGNOSIS — D682 Hereditary deficiency of other clotting factors: Secondary | ICD-10-CM

## 2011-01-26 MED ORDER — CYANOCOBALAMIN 1000 MCG/ML IJ SOLN
INTRAMUSCULAR | Status: AC
  Filled 2011-01-26: qty 1

## 2011-01-26 MED ORDER — CYANOCOBALAMIN 1000 MCG/ML IJ SOLN
1000.0000 ug | Freq: Once | INTRAMUSCULAR | Status: AC
  Administered 2011-01-26: 1000 ug via INTRAMUSCULAR

## 2011-01-26 NOTE — Progress Notes (Signed)
Labs drawn today for pt.  Patient on 8 and 9mg  alternating.  Call patient at [phone]

## 2011-01-26 NOTE — Progress Notes (Signed)
Justin Lang presents today for injection per MD orders. B12 1000mcg administered SQ in left Upper Arm. Administration without incident. Patient tolerated well.  

## 2011-01-26 NOTE — Telephone Encounter (Signed)
Pt.'s wife notified to  Continue same dose coumadin and have pt-inr in 3 weeks.

## 2011-01-26 NOTE — Telephone Encounter (Signed)
Message copied by Adelene Amas on Fri Jan 26, 2011  4:48 PM ------      Message from: Ellouise Newer III      Created: Fri Jan 26, 2011  2:28 PM       Same dose            PT/INR in 3 weeks

## 2011-01-31 ENCOUNTER — Encounter: Payer: Self-pay | Admitting: *Deleted

## 2011-02-02 ENCOUNTER — Encounter (HOSPITAL_BASED_OUTPATIENT_CLINIC_OR_DEPARTMENT_OTHER): Payer: Medicare Other

## 2011-02-02 ENCOUNTER — Other Ambulatory Visit (HOSPITAL_COMMUNITY): Payer: Self-pay | Admitting: Oncology

## 2011-02-02 VITALS — BP 158/76 | HR 62

## 2011-02-02 DIAGNOSIS — E538 Deficiency of other specified B group vitamins: Secondary | ICD-10-CM

## 2011-02-02 MED ORDER — CYANOCOBALAMIN 1000 MCG/ML IJ SOLN
1000.0000 ug | Freq: Once | INTRAMUSCULAR | Status: AC
  Administered 2011-02-02: 1000 ug via INTRAMUSCULAR

## 2011-02-02 MED ORDER — CYANOCOBALAMIN 1000 MCG/ML IJ SOLN
INTRAMUSCULAR | Status: AC
  Administered 2011-02-02: 1000 ug via INTRAMUSCULAR
  Filled 2011-02-02: qty 1

## 2011-02-02 NOTE — Progress Notes (Signed)
Tolerated Vitamin b-12 injection well.

## 2011-02-09 ENCOUNTER — Telehealth (HOSPITAL_COMMUNITY): Payer: Self-pay | Admitting: *Deleted

## 2011-02-09 ENCOUNTER — Encounter (HOSPITAL_BASED_OUTPATIENT_CLINIC_OR_DEPARTMENT_OTHER): Payer: Medicare Other

## 2011-02-09 DIAGNOSIS — E538 Deficiency of other specified B group vitamins: Secondary | ICD-10-CM

## 2011-02-09 MED ORDER — CYANOCOBALAMIN 1000 MCG/ML IJ SOLN
INTRAMUSCULAR | Status: AC
  Administered 2011-02-09: 1000 ug via INTRAMUSCULAR
  Filled 2011-02-09: qty 1

## 2011-02-09 MED ORDER — CYANOCOBALAMIN 1000 MCG/ML IJ SOLN
1000.0000 ug | Freq: Once | INTRAMUSCULAR | Status: AC
  Administered 2011-02-09: 1000 ug via INTRAMUSCULAR

## 2011-02-09 NOTE — Progress Notes (Signed)
Tolerated Vitamin B-12 1000 mg well.

## 2011-02-09 NOTE — Telephone Encounter (Signed)
Justin Lang has had b 12 injections weekly x 5. [name] is not sure that she sees much difference but she has had her knee replacement and been in the hospital, He seems a little better to me that a month ago. What do you want to do about the b12 shots?

## 2011-02-16 ENCOUNTER — Other Ambulatory Visit (HOSPITAL_COMMUNITY): Payer: Medicare Other

## 2011-02-16 ENCOUNTER — Emergency Department (HOSPITAL_COMMUNITY)
Admission: EM | Admit: 2011-02-16 | Discharge: 2011-02-16 | Disposition: A | Discharge status: Home / Self Care | Payer: Medicare Other | Source: Home / Self Care | Attending: Emergency Medicine | Admitting: Emergency Medicine

## 2011-02-16 ENCOUNTER — Inpatient Hospital Stay (HOSPITAL_COMMUNITY)
Admission: EM | Admit: 2011-02-16 | Discharge: 2011-02-18 | DRG: 605 | Disposition: A | Discharge status: Home Health | Payer: Medicare Other | Attending: Internal Medicine | Admitting: Internal Medicine

## 2011-02-16 ENCOUNTER — Encounter (HOSPITAL_COMMUNITY): Payer: Self-pay | Admitting: Emergency Medicine

## 2011-02-16 ENCOUNTER — Emergency Department (HOSPITAL_COMMUNITY): Payer: Medicare Other

## 2011-02-16 ENCOUNTER — Encounter (HOSPITAL_COMMUNITY): Payer: Self-pay | Admitting: *Deleted

## 2011-02-16 DIAGNOSIS — N289 Disorder of kidney and ureter, unspecified: Secondary | ICD-10-CM

## 2011-02-16 DIAGNOSIS — R030 Elevated blood-pressure reading, without diagnosis of hypertension: Secondary | ICD-10-CM

## 2011-02-16 DIAGNOSIS — R109 Unspecified abdominal pain: Secondary | ICD-10-CM

## 2011-02-16 DIAGNOSIS — S0003XA Contusion of scalp, initial encounter: Secondary | ICD-10-CM | POA: Insufficient documentation

## 2011-02-16 DIAGNOSIS — H269 Unspecified cataract: Secondary | ICD-10-CM

## 2011-02-16 DIAGNOSIS — Z23 Encounter for immunization: Secondary | ICD-10-CM | POA: Insufficient documentation

## 2011-02-16 DIAGNOSIS — Z7901 Long term (current) use of anticoagulants: Secondary | ICD-10-CM | POA: Insufficient documentation

## 2011-02-16 DIAGNOSIS — W19XXXA Unspecified fall, initial encounter: Secondary | ICD-10-CM

## 2011-02-16 DIAGNOSIS — S1093XA Contusion of unspecified part of neck, initial encounter: Secondary | ICD-10-CM | POA: Insufficient documentation

## 2011-02-16 DIAGNOSIS — S0083XA Contusion of other part of head, initial encounter: Secondary | ICD-10-CM

## 2011-02-16 DIAGNOSIS — Z86718 Personal history of other venous thrombosis and embolism: Secondary | ICD-10-CM | POA: Insufficient documentation

## 2011-02-16 DIAGNOSIS — G319 Degenerative disease of nervous system, unspecified: Secondary | ICD-10-CM | POA: Insufficient documentation

## 2011-02-16 DIAGNOSIS — D6859 Other primary thrombophilia: Secondary | ICD-10-CM | POA: Diagnosis present

## 2011-02-16 DIAGNOSIS — Z96659 Presence of unspecified artificial knee joint: Secondary | ICD-10-CM

## 2011-02-16 DIAGNOSIS — Z95818 Presence of other cardiac implants and grafts: Secondary | ICD-10-CM

## 2011-02-16 DIAGNOSIS — D682 Hereditary deficiency of other clotting factors: Secondary | ICD-10-CM

## 2011-02-16 DIAGNOSIS — M79609 Pain in unspecified limb: Secondary | ICD-10-CM

## 2011-02-16 DIAGNOSIS — R413 Other amnesia: Secondary | ICD-10-CM | POA: Diagnosis present

## 2011-02-16 DIAGNOSIS — Z85038 Personal history of other malignant neoplasm of large intestine: Secondary | ICD-10-CM

## 2011-02-16 DIAGNOSIS — Y9289 Other specified places as the place of occurrence of the external cause: Secondary | ICD-10-CM | POA: Insufficient documentation

## 2011-02-16 DIAGNOSIS — R51 Headache: Secondary | ICD-10-CM

## 2011-02-16 DIAGNOSIS — R1110 Vomiting, unspecified: Secondary | ICD-10-CM

## 2011-02-16 DIAGNOSIS — T07XXXA Unspecified multiple injuries, initial encounter: Secondary | ICD-10-CM

## 2011-02-16 DIAGNOSIS — H8309 Labyrinthitis, unspecified ear: Secondary | ICD-10-CM

## 2011-02-16 DIAGNOSIS — T148XXA Other injury of unspecified body region, initial encounter: Secondary | ICD-10-CM | POA: Diagnosis present

## 2011-02-16 DIAGNOSIS — W108XXA Fall (on) (from) other stairs and steps, initial encounter: Secondary | ICD-10-CM | POA: Diagnosis present

## 2011-02-16 DIAGNOSIS — G609 Hereditary and idiopathic neuropathy, unspecified: Secondary | ICD-10-CM | POA: Insufficient documentation

## 2011-02-16 DIAGNOSIS — T45515A Adverse effect of anticoagulants, initial encounter: Secondary | ICD-10-CM

## 2011-02-16 DIAGNOSIS — N318 Other neuromuscular dysfunction of bladder: Secondary | ICD-10-CM

## 2011-02-16 DIAGNOSIS — M129 Arthropathy, unspecified: Secondary | ICD-10-CM

## 2011-02-16 DIAGNOSIS — R112 Nausea with vomiting, unspecified: Secondary | ICD-10-CM

## 2011-02-16 DIAGNOSIS — Z87442 Personal history of urinary calculi: Secondary | ICD-10-CM | POA: Insufficient documentation

## 2011-02-16 DIAGNOSIS — S7010XA Contusion of unspecified thigh, initial encounter: Principal | ICD-10-CM | POA: Diagnosis present

## 2011-02-16 DIAGNOSIS — IMO0002 Reserved for concepts with insufficient information to code with codable children: Secondary | ICD-10-CM | POA: Insufficient documentation

## 2011-02-16 DIAGNOSIS — Z86711 Personal history of pulmonary embolism: Secondary | ICD-10-CM

## 2011-02-16 DIAGNOSIS — E538 Deficiency of other specified B group vitamins: Secondary | ICD-10-CM | POA: Diagnosis present

## 2011-02-16 DIAGNOSIS — E861 Hypovolemia: Secondary | ICD-10-CM

## 2011-02-16 DIAGNOSIS — D649 Anemia, unspecified: Secondary | ICD-10-CM

## 2011-02-16 DIAGNOSIS — Z9049 Acquired absence of other specified parts of digestive tract: Secondary | ICD-10-CM

## 2011-02-16 DIAGNOSIS — S0990XA Unspecified injury of head, initial encounter: Secondary | ICD-10-CM | POA: Insufficient documentation

## 2011-02-16 DIAGNOSIS — Z950 Presence of cardiac pacemaker: Secondary | ICD-10-CM | POA: Insufficient documentation

## 2011-02-16 DIAGNOSIS — K5289 Other specified noninfective gastroenteritis and colitis: Secondary | ICD-10-CM | POA: Diagnosis present

## 2011-02-16 DIAGNOSIS — Z87828 Personal history of other (healed) physical injury and trauma: Secondary | ICD-10-CM | POA: Insufficient documentation

## 2011-02-16 DIAGNOSIS — R197 Diarrhea, unspecified: Secondary | ICD-10-CM

## 2011-02-16 HISTORY — DX: Bradycardia, unspecified: R00.1

## 2011-02-16 LAB — PROTIME-INR
INR: 2.47 — ABNORMAL HIGH (ref 0.00–1.49)
Prothrombin Time: 27.2 seconds — ABNORMAL HIGH (ref 11.6–15.2)

## 2011-02-16 LAB — DIFFERENTIAL
Lymphocytes Relative: 19 % (ref 12–46)
Lymphs Abs: 1.5 10*3/uL (ref 0.7–4.0)
Monocytes Relative: 7 % (ref 3–12)
Neutro Abs: 5.3 10*3/uL (ref 1.7–7.7)
Neutrophils Relative %: 68 % (ref 43–77)

## 2011-02-16 LAB — BASIC METABOLIC PANEL
BUN: 19 mg/dL (ref 6–23)
Chloride: 103 mEq/L (ref 96–112)
Glucose, Bld: 114 mg/dL — ABNORMAL HIGH (ref 70–99)
Potassium: 3.6 mEq/L (ref 3.5–5.1)
Sodium: 138 mEq/L (ref 135–145)

## 2011-02-16 LAB — CBC
Hemoglobin: 11.8 g/dL — ABNORMAL LOW (ref 13.0–17.0)
Platelets: 203 10*3/uL (ref 150–400)
RBC: 4.08 MIL/uL — ABNORMAL LOW (ref 4.22–5.81)
WBC: 7.8 10*3/uL (ref 4.0–10.5)

## 2011-02-16 MED ORDER — TETANUS-DIPHTHERIA TOXOIDS TD 5-2 LFU IM INJ
0.5000 mL | INJECTION | Freq: Once | INTRAMUSCULAR | Status: AC
  Administered 2011-02-16: 0.5 mL via INTRAMUSCULAR

## 2011-02-16 MED ORDER — TETANUS-DIPHTHERIA TOXOIDS TD 5-2 LFU IM INJ
INJECTION | INTRAMUSCULAR | Status: AC
  Filled 2011-02-16: qty 0.5

## 2011-02-16 MED ORDER — TETANUS-DIPHTH-ACELL PERTUSSIS 5-2.5-18.5 LF-MCG/0.5 IM SUSP
0.5000 mL | Freq: Once | INTRAMUSCULAR | Status: DC
  Filled 2011-02-16: qty 0.5

## 2011-02-16 MED ORDER — BACITRACIN ZINC 500 UNIT/GM EX OINT
TOPICAL_OINTMENT | Freq: Once | CUTANEOUS | Status: AC
  Administered 2011-02-16: 1 via TOPICAL
  Filled 2011-02-16: qty 0.9

## 2011-02-16 MED ORDER — HYDROCODONE-ACETAMINOPHEN 5-325 MG PO TABS
1.0000 | ORAL_TABLET | Freq: Once | ORAL | Status: AC
  Administered 2011-02-16: 1 via ORAL
  Filled 2011-02-16: qty 1

## 2011-02-16 MED ORDER — ACETAMINOPHEN 325 MG PO TABS
650.0000 mg | ORAL_TABLET | Freq: Once | ORAL | Status: AC
  Administered 2011-02-16: 650 mg via ORAL
  Filled 2011-02-16: qty 2

## 2011-02-16 NOTE — ED Notes (Signed)
Pt fell on the hospital sidewalk. Pt states he tripped on the unlevel walk.

## 2011-02-16 NOTE — ED Notes (Signed)
Pt up to restroom at discharge.  Denies dizziness or vision changes. Pt ambulated without assistance.

## 2011-02-16 NOTE — ED Provider Notes (Signed)
History    This chart was scribed for Suzi Roots, MD, MD by Smitty Pluck. The patient was seen in room APA10 and the patient's care was started at 9:40AM.   CSN: [id number]  Arrival date & time 02/16/11  0929   First MD Initiated Contact with Patient 02/16/11 [phone]      Chief Complaint  Patient presents with  . Fall  . Head Laceration    (Consider location/radiation/quality/duration/timing/severity/associated sxs/prior treatment) The history is provided by the patient.   Justin Lang is a 78 y.o. male who presents to the Emergency Department complaining of fall and head laceration onset today. He has moderate head pain due to lacerations. Dull. The pain has been constant since onset without radiation. Pt was headed into short stay center and fell due to uneven pavement on sidewalk. Pt reports being on coumadin. Pt denies LOC, and neck pain. He also has left hand pain. He states that his tetanus shot is not up to date. Falltoday, contusion head/face. On coumadin. No current lovenox rx. Hx chronic anticoag for past 20 yrs since dx pe. No other abn bleeding or bruising. No neck or back pain. No numbness/weakness.   Past Medical History  Diagnosis Date  . Colon cancer     hx of metastic- treated with surgery and readiation  . DVT (deep venous thrombosis)     hx of s/p Greenfield filter  . OAB (overactive bladder)   . Arthritis   . Cataracts, bilateral   . FH: factor V Leiden deficiency   . History of nephrolithiasis   . History of ankle fracture   . 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  . Peripheral neuropathy   . Vitamin B12 deficiency     Past Surgical History  Procedure Date  . Colectomy     sigmoid secondary to colon ca- 1997  . Exploratory laparotomy     metastatic retroperitoneal lymph node- 2002  . Total knee arthroplasty     right-1991; left-1992  . Back surgeries     x3  . Filter replacement   . Left ankle surgery   . Incisional  herniorraphy   . Pacemaker insertion   . Replacement total knee bilateral   . Colon resection   . Colonoscopy 08/18/2010    Procedure: COLONOSCOPY;  Surgeon: Malissa Hippo, MD;  Location: [facility];  Service: Endoscopy;  Laterality: N/A;  . Esophagogastroduodenoscopy 08/18/2010    Procedure: ESOPHAGOGASTRODUODENOSCOPY (EGD);  Surgeon: Malissa Hippo, MD;  Location: [facility];  Service: Endoscopy;  Laterality: N/A;    Family History  Problem Relation Age of Onset  . Anesthesia problems Neg Hx   . Hypotension Neg Hx   . Malignant hyperthermia Neg Hx   . Pseudochol deficiency Neg Hx   . Cancer Sister     History  Substance Use Topics  . Smoking status: Never Smoker   . Smokeless tobacco: Never Used  . Alcohol Use: No      Review of Systems  All other systems reviewed and are negative.  no faintness or dizziness. No nv. No neck/back pain.  10 Systems reviewed and are negative for acute change except as noted in the HPI.   Allergies  Celecoxib  Home Medications   Current Outpatient Rx  Name Route Sig Dispense Refill  . ACETAMINOPHEN 325 MG PO TABS Oral Take 650 mg by mouth every 6 (six) hours as needed.     Marland Kitchen ENOXAPARIN SODIUM 80  MG/0.8ML Celina SOLN Subcutaneous Inject 0.7 mLs (70 mg total) into the skin every 12 (twelve) hours. 22.4 mL 0  . BENEFIBER PO PACK Oral Take 1 packet by mouth at bedtime. 300 g 12  . PANTOPRAZOLE SODIUM 40 MG PO TBEC Oral Take 1 tablet (40 mg total) by mouth daily. 30 tablet 5  . PEG-KCL-NACL-NASULF-NA ASC-C 100 G PO SOLR Oral Take 1 kit (100 g total) by mouth once. 1 kit 0  . WARFARIN SODIUM 1 MG PO TABS  TAKE AS INSTRUCTED 30 tablet 1  . WARFARIN SODIUM 4 MG PO TABS Oral Take 8 mg by mouth daily.       BP 179/93  Pulse 77  Temp(Src) 97.3 F (36.3 C) (Oral)  Resp 20  Ht 5\' 8"  (1.727 m)  Wt 160 lb (72.576 kg)  BMI 24.33 kg/m2  SpO2 100%  Physical Exam  Nursing note and vitals reviewed. Constitutional: He is oriented to person,  place, and time. He appears well-developed and well-nourished. No distress.  HENT:  Head: Normocephalic.  Mouth/Throat: Oropharynx is clear and moist.       Abrasion/contusion to forehad and nose. Facial bones/orbits grossly intact.   Eyes: EOM are normal. Pupils are equal, round, and reactive to light.  Neck: Normal range of motion. Neck supple. No tracheal deviation present.       Spine non tender, aligned, no step off  Cardiovascular: Normal rate, regular rhythm, normal heart sounds and intact distal pulses.   Pulmonary/Chest: Effort normal and breath sounds normal. No respiratory distress.  Abdominal: Soft. He exhibits no distension. There is no tenderness.  Musculoskeletal: Normal range of motion.       No bony tenderness along knee, hand or face. No spinal tenderness  ctls spine nt.  V small superficial abrasion to right knee. Good rom without pain, knee stable. Abrasion to thenar area left palm, good rom, no focal bony tenderness, no scaphoid tenderness.   Neurological: He is alert and oriented to person, place, and time.       Motor intact bil, steady gait.   Skin: Skin is warm and dry.       Abrasions on face, nose left hand and right knee.  Psychiatric: He has a normal mood and affect. His behavior is normal.    ED Course  Procedures (including critical care time)  DIAGNOSTIC STUDIES: Oxygen Saturation is 100% on room air, normal by my interpretation.    COORDINATION OF CARE:  9:46AM EDP ordered medication: tylenol   Results for orders placed during the hospital encounter of 02/16/11  PROTIME-INR      Component Value Range   Prothrombin Time 27.2 (*) 11.6 - 15.2 (seconds)   INR 2.47 (*) 0.00 - 1.49    Results for orders placed during the hospital encounter of 02/16/11  PROTIME-INR      Component Value Range   Prothrombin Time 27.2 (*) 11.6 - 15.2 (seconds)   INR 2.47 (*) 0.00 - 1.49    Ct Head Wo Contrast  02/16/2011  *RADIOLOGY REPORT*  Clinical Data: Fall.   Trauma to the head.  CT HEAD WITHOUT CONTRAST  Technique:  Contiguous axial images were obtained from the base of the skull through the vertex without contrast.  Comparison: 09/30/2009  Findings: The brain shows generalized atrophy.  There is no evidence of old or acute focal infarction, mass lesion, hemorrhage, hydrocephalus or extra-axial collection.  No skull fracture.  No fluid in the sinuses.  Left frontal scalp injury is  evident.  IMPRESSION: No acute intracranial finding.  No skull fracture.  Age related atrophy.  Left frontal scalp injury.  Original Report Authenticated By: Thomasenia Sales, M.D.          MDM  Pt on coumadin, fell/head contusion. Ct.   Tetanus unknown, tetanus im. Tylenol po (pt drove self to hospital).  Recheck pt comfortable. No new c/o. Wound care to wounds.   I personally performed the services described in this documentation, which was scribed in my presence. The recorded information has been reviewed and considered. Suzi Roots, MD      Suzi Roots, MD 02/16/11 [phone]

## 2011-02-16 NOTE — ED Notes (Signed)
Pt from home, pt fell earlier today and was seen at [facility]. Head CT performed, lac to L eye with bandage, no sutures. Pts wife called ems again because pt was having weakness and L left leg pain, stated he was unable to walk due to pain. No xrays performed at [facility]. Ems noted swelling. No obvious deformity.

## 2011-02-16 NOTE — ED Notes (Signed)
Patient transported to X-ray 

## 2011-02-16 NOTE — ED Notes (Signed)
Pt to CT

## 2011-02-16 NOTE — ED Provider Notes (Signed)
History     CSN: [id number]  Arrival date & time 02/16/11  1801   First MD Initiated Contact with Patient 02/16/11 1910      Chief Complaint  Patient presents with  . Fall  . Weakness    (Consider location/radiation/quality/duration/timing/severity/associated sxs/prior treatment) Patient is a 78 y.o. male presenting with fall and weakness. The history is provided by the patient.  Fall  Weakness  Additional symptoms include weakness.  He had fallen this morning when he was going to any pain hospital to have his INR checked. He suffered a facial laceration and was seen in the emergency department where he had a CT scan obtained. He was ambulatory while in the emergency department. He went home and went to sleep, and when he woke up, he had severe pain in his left thigh and hip. Pain is worse with movement. It is moderate and he rates it at 5-6/10. When he tries to him today, pain gets up to 9/10. He is on Coumadin for history of DVT and pulmonary embolism, and is worried that he has another DVT.  Past Medical History  Diagnosis Date  . Colon cancer     hx of metastic- treated with surgery and readiation  . DVT (deep venous thrombosis)     hx of s/p Greenfield filter  . OAB (overactive bladder)   . Arthritis   . Cataracts, bilateral   . FH: factor V Leiden deficiency   . History of nephrolithiasis   . History of ankle fracture   . 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  . Peripheral neuropathy   . Vitamin B12 deficiency   . Symptomatic bradycardia     Past Surgical History  Procedure Date  . Colectomy     sigmoid secondary to colon ca- 1997  . Exploratory laparotomy     metastatic retroperitoneal lymph node- 2002  . Total knee arthroplasty     right-1991; left-1992  . Back surgeries     x3  . Filter replacement   . Left ankle surgery   . Incisional herniorraphy   . Pacemaker insertion   . Replacement total knee bilateral   . Colon resection   .  Colonoscopy 08/18/2010    Procedure: COLONOSCOPY;  Surgeon: Malissa Hippo, MD;  Location: [facility];  Service: Endoscopy;  Laterality: N/A;  . Esophagogastroduodenoscopy 08/18/2010    Procedure: ESOPHAGOGASTRODUODENOSCOPY (EGD);  Surgeon: Malissa Hippo, MD;  Location: [facility];  Service: Endoscopy;  Laterality: N/A;    Family History  Problem Relation Age of Onset  . Anesthesia problems Neg Hx   . Hypotension Neg Hx   . Malignant hyperthermia Neg Hx   . Pseudochol deficiency Neg Hx   . Cancer Sister     History  Substance Use Topics  . Smoking status: Never Smoker   . Smokeless tobacco: Never Used  . Alcohol Use: No      Review of Systems  Neurological: Positive for weakness.  All other systems reviewed and are negative.    Allergies  Celecoxib  Home Medications   Current Outpatient Rx  Name Route Sig Dispense Refill  . CYANOCOBALAMIN 1000 MCG/ML IJ SOLN Intramuscular Inject 1,000 mcg into the muscle once a week.    . WARFARIN SODIUM 1 MG PO TABS Oral Take 1 mg by mouth See admin instructions. TAKES DAILY WITH 7.5MG  TABLET TO MAKE TOTAL OF 8.5MG  DOSAGE    . WARFARIN SODIUM 7.5 MG PO TABS Oral  Take 7.5 mg by mouth See admin instructions. Take 7.5mg  (1 tablet ) along with 1 mg tablet for a total of 8.5 mg daily      BP 167/71  Pulse 58  Temp(Src) 97.6 F (36.4 C) (Oral)  Resp 20  SpO2 100%  Physical Exam  Nursing note and vitals reviewed.  29 are old male who is awake alert and in no acute distress. Vital signs show mild hypertension with blood pressure 167/71, mild bradycardia with heart rate of 58. Oxygen saturation is 100% which is normal. He has had left periorbital ecchymosis and a bandage in place which is not removed the. PERRLA, EOMI. Back is nontender. Back is nontender. Lungs are clear without rales, wheezes, rhonchi. Heart has regular rate and rhythm without murmur. Abdomen is soft, flat, nontender without masses or hepatosplenomegaly. Extremities:  There is tenderness palpation in the left hip and thigh laterally. Pain is elicited with passive range of motion of the left hip. He has 1+ pitting edema and some venous stasis changes. No deformity is seen. Skin is warm and moist without other rash. Neurologic: Mental status is normal, cranial nerves are intact, there no focal motor or sensory deficits.  ED Course  Procedures (including critical care time)   Results for orders placed during the hospital encounter of 02/16/11  CBC      Component Value Range   WBC 7.8  4.0 - 10.5 (K/uL)   RBC 4.08 (*) 4.22 - 5.81 (MIL/uL)   Hemoglobin 11.8 (*) 13.0 - 17.0 (g/dL)   HCT 16.1 (*) 09.6 - 52.0 (%)   MCV 88.2  78.0 - 100.0 (fL)   MCH 28.9  26.0 - 34.0 (pg)   MCHC 32.8  30.0 - 36.0 (g/dL)   RDW 04.5  40.9 - 81.1 (%)   Platelets 203  150 - 400 (K/uL)  DIFFERENTIAL      Component Value Range   Neutrophils Relative 68  43 - 77 (%)   Neutro Abs 5.3  1.7 - 7.7 (K/uL)   Lymphocytes Relative 19  12 - 46 (%)   Lymphs Abs 1.5  0.7 - 4.0 (K/uL)   Monocytes Relative 7  3 - 12 (%)   Monocytes Absolute 0.6  0.1 - 1.0 (K/uL)   Eosinophils Relative 6 (*) 0 - 5 (%)   Eosinophils Absolute 0.4  0.0 - 0.7 (K/uL)   Basophils Relative 0  0 - 1 (%)   Basophils Absolute 0.0  0.0 - 0.1 (K/uL)  BASIC METABOLIC PANEL      Component Value Range   Sodium 138  135 - 145 (mEq/L)   Potassium 3.6  3.5 - 5.1 (mEq/L)   Chloride 103  96 - 112 (mEq/L)   CO2 24  19 - 32 (mEq/L)   Glucose, Bld 114 (*) 70 - 99 (mg/dL)   BUN 19  6 - 23 (mg/dL)   Creatinine, Ser 9.14 (*) 0.50 - 1.35 (mg/dL)   Calcium 8.5  8.4 - 78.2 (mg/dL)   GFR calc non Af Amer 33 (*) >90 (mL/min)   GFR calc Af Amer 38 (*) >90 (mL/min)   Dg Femur Left  02/16/2011  *RADIOLOGY REPORT*  Clinical Data: Status post fall.  Pain.  LEFT FEMUR - 2 VIEW  Comparison: Plain films left femur 11/28/2004.  Findings: Left total knee replacement is noted.  There is no acute bony or joint abnormality.  Mild appearing  degenerative change about the right hip is identified.  Soft tissues are unremarkable that  IMPRESSION: No acute finding.  Original Report Authenticated By: Bernadene Bell. Maricela Curet, M.D.   Ct Head Wo Contrast  02/16/2011  *RADIOLOGY REPORT*  Clinical Data: Fall.  Trauma to the head.  CT HEAD WITHOUT CONTRAST  Technique:  Contiguous axial images were obtained from the base of the skull through the vertex without contrast.  Comparison: 09/30/2009  Findings: The brain shows generalized atrophy.  There is no evidence of old or acute focal infarction, mass lesion, hemorrhage, hydrocephalus or extra-axial collection.  No skull fracture.  No fluid in the sinuses.  Left frontal scalp injury is evident.  IMPRESSION: No acute intracranial finding.  No skull fracture.  Age related atrophy.  Left frontal scalp injury.  Original Report Authenticated By: Thomasenia Sales, M.D.   Ct Hip Left Wo Contrast  02/16/2011  *RADIOLOGY REPORT*  Clinical Data: Left hip pain and left leg weakness secondary to a fall this morning.  CT OF THE LEFT HIP WITHOUT CONTRAST  Technique:  Multidetector CT imaging was performed according to the standard protocol. Multiplanar CT image reconstructions were also generated.  Comparison: Radiographs dated 02/16/2011  Findings: The osseous structures of the left hip are normal.  The patient has abnormal edema in the vastus intermedius and lateralis muscles of the  left thigh.  The inferior extent of the abnormality is not appreciable.  There is also slight increased density in those muscles which may represent hemorrhage.  IMPRESSION:  1.  No fractures. 2.  Probable hemorrhage and tears or strains of the vastus intermedius and vastus lateralis muscles of the left thigh.  The inferior extent of the abnormality is not visible on this hip CT scan.  Original Report Authenticated By: Gwynn Burly, M.D.      X-ray shows no evidence of fracture. Attempt was made to ambulate him but he was unable to ambulate  even after having received a dose of hydrocodone-acetaminophen for pain. CT of the hip will be obtained to rule out occult fracture. He does not have sufficient support at home to manage when he is nonambulatory, so we will need to be admitted for placement at skilled nursing facility/rehabilitation facility.  Case is discussed with Dr. Joneen Roach who agrees to see the patient to arrange admission. Laboratory workup is significant for a slight drop in hemoglobin which may be due to blood loss into the thigh. There's also been a slight elevation in his creatinine.  1. Fall   2. Traumatic hematoma of thigh   3. Anemia   4. Renal insufficiency   5. Warfarin-induced coagulopathy       MDM  Prior ED chart was reviewed, and head CT was negative. INR was therapeutic at 2.47. Most likely, he has delayed bleeding into his left thigh from the fall. To him x-rays will be obtained to rule out fracture. However, it's unlikely that he had a fracture since he was noted to be ambulatory without distress prior to discharge from the emergency department earlier today.        Dione Booze, MD 02/16/11 [phone]

## 2011-02-16 NOTE — ED Notes (Signed)
Pt in from EMS on "scoop" LSB. Placed for lifting support due to weakness, not for spinal stabilization. Greta Doom, EDPA at bedside, cleared pt from board with assist x2. Pt c/o pain to bil legs from hips down, worse on L leg. Sts he cannot lift left leg due to pain. Sts Hx of DVT, concerned he may have another one. No swelling, deformity, redness, warmth noted to legs. Pulses intact distally.

## 2011-02-17 ENCOUNTER — Encounter (HOSPITAL_COMMUNITY): Payer: Self-pay | Admitting: Family Medicine

## 2011-02-17 DIAGNOSIS — T148XXA Other injury of unspecified body region, initial encounter: Secondary | ICD-10-CM | POA: Diagnosis present

## 2011-02-17 DIAGNOSIS — Z7901 Long term (current) use of anticoagulants: Secondary | ICD-10-CM

## 2011-02-17 LAB — HEMOGLOBIN AND HEMATOCRIT, BLOOD
HCT: 33.8 % — ABNORMAL LOW (ref 39.0–52.0)
Hemoglobin: 11.9 g/dL — ABNORMAL LOW (ref 13.0–17.0)
Hemoglobin: 11.1 g/dL — ABNORMAL LOW (ref 13.0–17.0)

## 2011-02-17 LAB — PROTIME-INR
INR: 2.7 — ABNORMAL HIGH (ref 0.00–1.49)
INR: 2.49 — ABNORMAL HIGH (ref 0.00–1.49)

## 2011-02-17 LAB — CBC
HCT: 34.4 % — ABNORMAL LOW (ref 39.0–52.0)
Hemoglobin: 11.2 g/dL — ABNORMAL LOW (ref 13.0–17.0)
MCH: 28.7 pg (ref 26.0–34.0)
MCHC: 32.6 g/dL (ref 30.0–36.0)
MCV: 88.2 fL (ref 78.0–100.0)
Platelets: 213 10*3/uL (ref 150–400)
RBC: 3.9 MIL/uL — ABNORMAL LOW (ref 4.22–5.81)
RDW: 14.6 % (ref 11.5–15.5)
WBC: 7.2 10*3/uL (ref 4.0–10.5)

## 2011-02-17 LAB — BASIC METABOLIC PANEL
BUN: 18 mg/dL (ref 6–23)
CO2: 24 mEq/L (ref 19–32)
Calcium: 8.1 mg/dL — ABNORMAL LOW (ref 8.4–10.5)
Chloride: 104 mEq/L (ref 96–112)
Creatinine, Ser: 1.6 mg/dL — ABNORMAL HIGH (ref 0.50–1.35)
GFR calc Af Amer: 43 mL/min — ABNORMAL LOW (ref >90)
GFR calc non Af Amer: 37 mL/min — ABNORMAL LOW (ref >90)
Glucose, Bld: 131 mg/dL — ABNORMAL HIGH (ref 70–99)
Potassium: 3.7 mEq/L (ref 3.5–5.1)
Sodium: 136 mEq/L (ref 135–145)

## 2011-02-17 MED ORDER — PHYTONADIONE 5 MG PO TABS
5.0000 mg | ORAL_TABLET | Freq: Once | ORAL | Status: AC
  Administered 2011-02-17: 5 mg via ORAL
  Filled 2011-02-17: qty 1

## 2011-02-17 MED ORDER — ONDANSETRON HCL 4 MG/2ML IJ SOLN: 4.0000 mg | Freq: Four times a day (QID) | INTRAMUSCULAR | Status: DC | PRN

## 2011-02-17 MED ORDER — ACETAMINOPHEN 325 MG PO TABS
650.0000 mg | ORAL_TABLET | Freq: Four times a day (QID) | ORAL | Status: DC | PRN
  Administered 2011-02-17 – 2011-02-18 (×2): 650 mg via ORAL
  Filled 2011-02-17 (×2): qty 2

## 2011-02-17 MED ORDER — SODIUM CHLORIDE 0.9 % IV SOLN
INTRAVENOUS | Status: DC
  Administered 2011-02-17 (×2): via INTRAVENOUS

## 2011-02-17 MED ORDER — MORPHINE SULFATE 2 MG/ML IJ SOLN
2.0000 mg | INTRAMUSCULAR | Status: DC | PRN
  Administered 2011-02-17: 2 mg via INTRAVENOUS
  Filled 2011-02-17: qty 1

## 2011-02-17 MED ORDER — ONDANSETRON HCL 4 MG PO TABS: 4.0000 mg | ORAL_TABLET | Freq: Four times a day (QID) | ORAL | Status: DC | PRN

## 2011-02-17 NOTE — H&P (Addendum)
PCP:   Dwana Melena, MD, MD   Chief Complaint:  Unable to move left lower extremity  HPI: This is an unfortunate 78 year old gentleman who has a history of recurrent DVTs and PEs, factor V Leiden deficiency on chronic anticoagulation. Today he was at [facility] to do blood work when he tripped and fell going up a step. He was seen to the ER, CT head was done and negative. He had little pain in his legs at the time, despite the fact that he had hit his left side including the leg during the fall. He was discharged home, he walked to bed. During the course of the evening he developed increasing pain in the left lower extremity, eventually he stated he could not walk due to pain. He also developed hematoma around the left eye and bruising on the left hand. The family brought him to the ER. History provided by patient and family. The patient has moderate memory loss.  Review of Systems:  anorexia, fever, weight loss,, vision loss, decreased hearing, hoarseness, chest pain, syncope, dyspnea on exertion, peripheral edema, balance deficits, hemoptysis, abdominal pain, melena, hematochezia, severe indigestion/heartburn, hematuria, incontinence, genital sores, muscle weakness, suspicious skin lesions, transient blindness, difficulty walking, depression, unusual weight change, abnormal bleeding, enlarged lymph nodes, angioedema, and breast masses.  Past Medical History: Past Medical History  Diagnosis Date  . Colon cancer     hx of metastic- treated with surgery and readiation  . DVT (deep venous thrombosis)     hx of s/p [place] filter  . OAB (overactive bladder)   . Arthritis   . Cataracts, bilateral   . FH: factor V Leiden deficiency   . History of nephrolithiasis   . History of ankle fracture   . 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  . Peripheral neuropathy   . Vitamin B12 deficiency   . Symptomatic bradycardia    Past Surgical History  Procedure Date  . Colectomy    sigmoid secondary to colon ca- 1997  . Exploratory laparotomy     metastatic retroperitoneal lymph node- 2002  . Total knee arthroplasty     right-1991; left-1992  . Back surgeries     x3  . Filter replacement   . Left ankle surgery   . Incisional herniorraphy   . Pacemaker insertion   . Replacement total knee bilateral   . Colon resection   . Colonoscopy 08/18/2010    Procedure: COLONOSCOPY;  Surgeon: Malissa Hippo, MD;  Location: [facility];  Service: Endoscopy;  Laterality: N/A;  . Esophagogastroduodenoscopy 08/18/2010    Procedure: ESOPHAGOGASTRODUODENOSCOPY (EGD);  Surgeon: Malissa Hippo, MD;  Location: [facility];  Service: Endoscopy;  Laterality: N/A;    Medications: Prior to Admission medications   Medication Sig Start Date End Date Taking? Authorizing Provider  cyanocobalamin (,VITAMIN B-12,) 1000 MCG/ML injection Inject 1,000 mcg into the muscle once a week.   Yes Historical Provider, MD  warfarin (COUMADIN) 1 MG tablet Take 1 mg by mouth See admin instructions. TAKES DAILY WITH 7.5MG  TABLET TO MAKE TOTAL OF 8.5MG  DOSAGE 02/02/11  Yes Randall An, MD  warfarin (COUMADIN) 7.5 MG tablet Take 7.5 mg by mouth See admin instructions. Take 7.5mg  (1 tablet ) along with 1 mg tablet for a total of 8.5 mg daily   Yes Historical Provider, MD    Allergies:   Allergies  Allergen Reactions  . Celecoxib Rash    Social History:  reports that he has never  smoked. He has never used smokeless tobacco. He reports that he does not drink alcohol or use illicit drugs. patient does not use a cane or walker but should per family  Family History: Family History  Problem Relation Age of Onset  . Anesthesia problems Neg Hx   . Hypotension Neg Hx   . Malignant hyperthermia Neg Hx   . Pseudochol deficiency Neg Hx   . Cancer Sister     Physical Exam: Filed Vitals:   02/16/11 1810 02/16/11 1939  BP: 167/71 180/84  Pulse: 58 72  Temp: 97.6 F (36.4 C) 97.5 F (36.4 C)    TempSrc: Oral   Resp: 20 18  SpO2: 100% 99%    General:  Alert and oriented times three, well developed and nourished, no acute distress Eyes: PERRLA, pink conjunctiva, no scleral icterus, bruising around left eye ENT: Moist oral mucosa, neck supple, no thyromegaly Lungs: clear to ascultation, no wheeze, no crackles, no use of accessory muscles Cardiovascular: regular rate and rhythm, no regurgitation, no gallops, no murmurs. No carotid bruits, no JVD Abdomen: soft, positive BS, non-tender, non-distended, no organomegaly, not an acute abdomen GU: not examined Neuro: CN II - XII grossly intact, sensation intact Musculoskeletal: strength 5/5 all extremities, no clubbing, cyanosis or edema, no bruising appreciated on left thigh, but no hematoma palpated left thigh, minor abrasion right knee, minor abrasion left palm Skin: no rash, no subcutaneous crepitation, no decubitus Psych: appropriate patient   Labs on Admission:   [facility] 02/16/11 2137  NA 138  K 3.6  CL 103  CO2 24  GLUCOSE 114*  BUN 19  CREATININE 1.79*  CALCIUM 8.5  MG --  PHOS --  Results for AMMAN, BARTEL (MRN [id number]) as of 02/17/2011 00:16  Ref. Range 02/16/2011 09:56  Prothrombin Time Latest Range: 11.6-15.2 seconds 27.2 (H)  INR Latest Range: 0.00-1.49  2.47 (H)   No results found for this basename: AST:2,ALT:2,ALKPHOS:2,BILITOT:2,PROT:2,ALBUMIN:2 in the last 72 hours No results found for this basename: LIPASE:2,AMYLASE:2 in the last 72 hours  Basename 02/16/11 2137  WBC 7.8  NEUTROABS 5.3  HGB 11.8*  HCT 36.0*  MCV 88.2  PLT 203   No results found for this basename: CKTOTAL:3,CKMB:3,CKMBINDEX:3,TROPONINI:3 in the last 72 hours No components found with this basename: POCBNP:3 No results found for this basename: DDIMER:2 in the last 72 hours No results found for this basename: HGBA1C:2 in the last 72 hours No results found for this basename: CHOL:2,HDL:2,LDLCALC:2,TRIG:2,CHOLHDL:2,LDLDIRECT:2 in the  last 72 hours No results found for this basename: TSH,T4TOTAL,FREET3,T3FREE,THYROIDAB in the last 72 hours No results found for this basename: VITAMINB12:2,FOLATE:2,FERRITIN:2,TIBC:2,IRON:2,RETICCTPCT:2 in the last 72 hours  Micro Results: No results found for this or any previous visit (from the past 240 hour(s)).   Radiological Exams on Admission: Dg Femur Left  02/16/2011  *RADIOLOGY REPORT*  Clinical Data: Status post fall.  Pain.  LEFT FEMUR - 2 VIEW  Comparison: Plain films left femur 11/28/2004.  Findings: Left total knee replacement is noted.  There is no acute bony or joint abnormality.  Mild appearing degenerative change about the right hip is identified.  Soft tissues are unremarkable that  IMPRESSION: No acute finding.  Original Report Authenticated By: Bernadene Bell. Maricela Curet, M.D.   Ct Head Wo Contrast  02/16/2011  *RADIOLOGY REPORT*  Clinical Data: Fall.  Trauma to the head.  CT HEAD WITHOUT CONTRAST  Technique:  Contiguous axial images were obtained from the base of the skull through the vertex without contrast.  Comparison: 09/30/2009  Findings: The brain shows generalized atrophy.  There is no evidence of old or acute focal infarction, mass lesion, hemorrhage, hydrocephalus or extra-axial collection.  No skull fracture.  No fluid in the sinuses.  Left frontal scalp injury is evident.  IMPRESSION: No acute intracranial finding.  No skull fracture.  Age related atrophy.  Left frontal scalp injury.  Original Report Authenticated By: Thomasenia Sales, M.D.   Ct Hip Left Wo Contrast  02/16/2011  *RADIOLOGY REPORT*  Clinical Data: Left hip pain and left leg weakness secondary to a fall this morning.  CT OF THE LEFT HIP WITHOUT CONTRAST  Technique:  Multidetector CT imaging was performed according to the standard protocol. Multiplanar CT image reconstructions were also generated.  Comparison: Radiographs dated 02/16/2011  Findings: The osseous structures of the left hip are normal.  The patient has  abnormal edema in the vastus intermedius and lateralis muscles of the  left thigh.  The inferior extent of the abnormality is not appreciable.  There is also slight increased density in those muscles which may represent hemorrhage.  IMPRESSION:  1.  No fractures. 2.  Probable hemorrhage and tears or strains of the vastus intermedius and vastus lateralis muscles of the left thigh.  The inferior extent of the abnormality is not visible on this hip CT scan.  Original Report Authenticated By: Gwynn Burly, M.D.    Assessment/Plan Present on Admission:  .Hematoma left thigh Chronic anticoagulation Status post fall Admit to MedSurg Will hold Coumadin, INR has not been reversed due to patient's high risk of DVT and PE. Monitor H&H Nursing to measure thigh every shift Single low dose of vitamin K given  FACTOR V DEFICIENCY History of DVTs and PEs Status post IVC filter Coumadin on hold, restart in 24-48 hours  .VITAMIN B12 DEFICIENCY .VERTIGO SECONDARY TO LABRYNTHITIS .Marland KitchenNEUROPATHY-PERIPHERAL .Memory loss .COLON CANCER, HX OF .NEPHROLITHIASIS, HX OF .ELEVATED BLOOD PRESSURE .GASTROENTERITIS, chronic Stable resume home medications  Acute and chronic kidney disease Gentle IV fluid hydration   Full code Coumadin for DVT prophylaxis Team 5/Dr. Justice Britain, Demari Kropp 02/17/2011, 12:10 AM

## 2011-02-17 NOTE — Progress Notes (Signed)
MD, would you please address pt's VTE status. Thanks

## 2011-02-17 NOTE — Progress Notes (Signed)
Subjective: Relates right leg pain much improved. Objective: Filed Vitals:   02/16/11 1810 02/16/11 1939 02/17/11 0130 02/17/11 0545  BP: 167/71 180/84 145/67 142/76  Pulse: 58 72 61 68  Temp: 97.6 F (36.4 C) 97.5 F (36.4 C) 98.5 F (36.9 C) 97.3 F (36.3 C)  TempSrc: Oral  Oral Oral  Resp: 20 18 18 18   Height:   5\' 9"  (1.753 m)   Weight:   74.7 kg (164 lb 10.9 oz)   SpO2: 100% 99% 96% 98%   Weight change:  No intake or output data in the 24 hours ending 02/17/11 1014  General: Alert, awake, oriented x3, in no acute distress.  HEENT: No bruits, no goiter. Bruise in the right side of head no eye involvement. Heart: Regular rate and rhythm, without murmurs, rubs, gallops.  Lungs: good air movement CTA b/l Abdomen: Soft, nontender, nondistended, positive bowel sounds.  Neuro: Grossly intact, nonfocal.   Lab Results:  Basename 02/17/11 0442 02/16/11 2137  NA 136 138  K 3.7 3.6  CL 104 103  CO2 24 24  GLUCOSE 131* 114*  BUN 18 19  CREATININE 1.60* 1.79*  CALCIUM 8.1* 8.5  MG -- --  PHOS -- --   No results found for this basename: AST:2,ALT:2,ALKPHOS:2,BILITOT:2,PROT:2,ALBUMIN:2 in the last 72 hours No results found for this basename: LIPASE:2,AMYLASE:2 in the last 72 hours  Basename 02/17/11 0442 02/17/11 0100 02/16/11 2137  WBC 7.2 -- 7.8  NEUTROABS -- -- 5.3  HGB 11.2* 11.9* --  HCT 34.4* 36.3* --  MCV 88.2 -- 88.2  PLT 213 -- 203   No results found for this basename: CKTOTAL:3,CKMB:3,CKMBINDEX:3,TROPONINI:3 in the last 72 hours No components found with this basename: POCBNP:3 No results found for this basename: DDIMER:2 in the last 72 hours No results found for this basename: HGBA1C:2 in the last 72 hours No results found for this basename: CHOL:2,HDL:2,LDLCALC:2,TRIG:2,CHOLHDL:2,LDLDIRECT:2 in the last 72 hours No results found for this basename: TSH,T4TOTAL,FREET3,T3FREE,THYROIDAB in the last 72 hours No results found for this basename:  VITAMINB12:2,FOLATE:2,FERRITIN:2,TIBC:2,IRON:2,RETICCTPCT:2 in the last 72 hours  Micro Results: No results found for this or any previous visit (from the past 240 hour(s)).  Studies/Results: Dg Femur Left  02/16/2011  *RADIOLOGY REPORT*  Clinical Data: Status post fall.  Pain.  LEFT FEMUR - 2 VIEW  Comparison: Plain films left femur 11/28/2004.  Findings: Left total knee replacement is noted.  There is no acute bony or joint abnormality.  Mild appearing degenerative change about the right hip is identified.  Soft tissues are unremarkable that  IMPRESSION: No acute finding.  Original Report Authenticated By: Bernadene Bell. Maricela Curet, M.D.   Ct Head Wo Contrast  02/16/2011  *RADIOLOGY REPORT*  Clinical Data: Fall.  Trauma to the head.  CT HEAD WITHOUT CONTRAST  Technique:  Contiguous axial images were obtained from the base of the skull through the vertex without contrast.  Comparison: 09/30/2009  Findings: The brain shows generalized atrophy.  There is no evidence of old or acute focal infarction, mass lesion, hemorrhage, hydrocephalus or extra-axial collection.  No skull fracture.  No fluid in the sinuses.  Left frontal scalp injury is evident.  IMPRESSION: No acute intracranial finding.  No skull fracture.  Age related atrophy.  Left frontal scalp injury.  Original Report Authenticated By: Thomasenia Sales, M.D.   Ct Hip Left Wo Contrast  02/16/2011  *RADIOLOGY REPORT*  Clinical Data: Left hip pain and left leg weakness secondary to a fall this morning.  CT OF THE  LEFT HIP WITHOUT CONTRAST  Technique:  Multidetector CT imaging was performed according to the standard protocol. Multiplanar CT image reconstructions were also generated.  Comparison: Radiographs dated 02/16/2011  Findings: The osseous structures of the left hip are normal.  The patient has abnormal edema in the vastus intermedius and lateralis muscles of the  left thigh.  The inferior extent of the abnormality is not appreciable.  There is also  slight increased density in those muscles which may represent hemorrhage.  IMPRESSION:  1.  No fractures. 2.  Probable hemorrhage and tears or strains of the vastus intermedius and vastus lateralis muscles of the left thigh.  The inferior extent of the abnormality is not visible on this hip CT scan.  Original Report Authenticated By: Gwynn Burly, M.D.    Medications: I have reviewed the patient's current medications.  Assessment and plan: Principal Problem: 1.Hematoma: still painfull to palaption, Hbg drop 11.8->11.2. Continue to hold coumadin and monitor HBg. And INR.  2. FACTOR V DEFICIENCY: continue to hold coumadin.  3.Dospo: in 1-2 days.    LOS: 1 day   Marinda Elk M.D. Pager: [phone] [facility] 02/17/2011, 10:14 AM

## 2011-02-18 LAB — CBC
HCT: 34.6 % — ABNORMAL LOW (ref 39.0–52.0)
MCV: 88.3 fL (ref 78.0–100.0)
Platelets: 232 10*3/uL (ref 150–400)
RBC: 3.92 MIL/uL — ABNORMAL LOW (ref 4.22–5.81)
RDW: 14.6 % (ref 11.5–15.5)
WBC: 7.3 10*3/uL (ref 4.0–10.5)

## 2011-02-18 MED ORDER — OXYCODONE HCL 5 MG PO TABS: 5.0000 mg | ORAL_TABLET | ORAL | Status: AC | PRN

## 2011-02-18 MED ORDER — OXYCODONE HCL 5 MG PO TABS
5.0000 mg | ORAL_TABLET | ORAL | Status: DC | PRN
  Administered 2011-02-18: 5 mg via ORAL
  Filled 2011-02-18: qty 1

## 2011-02-18 NOTE — Progress Notes (Signed)
Admit date: 02/16/2011 Discharge date: 02/18/2011  Primary Care Physician:  Dwana Melena, MD, MD   Discharge Diagnoses:   Active Hospital Problems  Diagnoses Date Noted   . Anticoagulant long-term use 02/17/2011   . Memory loss 12/31/2006   . GASTROENTERITIS, ACUTE 12/23/2006   . VITAMIN B12 DEFICIENCY 05/02/2006   . NEUROPATHY-PERIPHERAL 04/04/2006   . NEPHROLITHIASIS, HX OF 12/12/2005     Resolved Hospital Problems  Diagnoses Date Noted Date Resolved  . Hematoma 02/17/2011 02/18/2011     DISCHARGE MEDICATION: Medication List  As of 02/18/2011  9:50 AM   TAKE these medications         cyanocobalamin 1000 MCG/ML injection   Commonly known as: (VITAMIN B-12)   Inject 1,000 mcg into the muscle once a week.      oxyCODONE 5 MG immediate release tablet   Commonly known as: Oxy IR/ROXICODONE   Take 1 tablet (5 mg total) by mouth every 4 (four) hours as needed.      warfarin 7.5 MG tablet   Commonly known as: COUMADIN   Take 7.5 mg by mouth See admin instructions. Take 7.5mg  (1 tablet ) along with 1 mg tablet for a total of 8.5 mg daily      warfarin 1 MG tablet   Commonly known as: COUMADIN   Take 1 mg by mouth See admin instructions. TAKES DAILY WITH 7.5MG  TABLET TO MAKE TOTAL OF 8.5MG  DOSAGE            SIGNIFICANT DIAGNOSTIC STUDIES:  Dg Femur Left  02/16/2011  *RADIOLOGY REPORT*  Clinical Data: Status post fall.  Pain.  LEFT FEMUR - 2 VIEW  Comparison: Plain films left femur 11/28/2004.  Findings: Left total knee replacement is noted.  There is no acute bony or joint abnormality.  Mild appearing degenerative change about the right hip is identified.  Soft tissues are unremarkable that  IMPRESSION: No acute finding.  Original Report Authenticated By: Bernadene Bell. Maricela Curet, M.D.   Ct Head Wo Contrast  02/16/2011  *RADIOLOGY REPORT*  Clinical Data: Fall.  Trauma to the head.  CT HEAD WITHOUT CONTRAST  Technique:  Contiguous axial images were obtained from the base of the skull  through the vertex without contrast.  Comparison: 09/30/2009  Findings: The brain shows generalized atrophy.  There is no evidence of old or acute focal infarction, mass lesion, hemorrhage, hydrocephalus or extra-axial collection.  No skull fracture.  No fluid in the sinuses.  Left frontal scalp injury is evident.  IMPRESSION: No acute intracranial finding.  No skull fracture.  Age related atrophy.  Left frontal scalp injury.  Original Report Authenticated By: Thomasenia Sales, M.D.   Ct Hip Left Wo Contrast  02/16/2011  *RADIOLOGY REPORT*  Clinical Data: Left hip pain and left leg weakness secondary to a fall this morning.  CT OF THE LEFT HIP WITHOUT CONTRAST  Technique:  Multidetector CT imaging was performed according to the standard protocol. Multiplanar CT image reconstructions were also generated.  Comparison: Radiographs dated 02/16/2011  Findings: The osseous structures of the left hip are normal.  The patient has abnormal edema in the vastus intermedius and lateralis muscles of the  left thigh.  The inferior extent of the abnormality is not appreciable.  There is also slight increased density in those muscles which may represent hemorrhage.  IMPRESSION:  1.  No fractures. 2.  Probable hemorrhage and tears or strains of the vastus intermedius and vastus lateralis muscles of the left thigh.  The inferior extent  of the abnormality is not visible on this hip CT scan.  Original Report Authenticated By: Gwynn Burly, M.D.     No results found for this or any previous visit (from the past 240 hour(s)).  BRIEF ADMITTING H & P: 78 year old gentleman who has a history of recurrent DVTs and PEs, factor V Leiden deficiency on chronic anticoagulation. Today he was at [facility] to do blood work when he tripped and fell going up a step. He was seen to the ER, CT head was done and negative. He had little pain in his legs at the time, despite the fact that he had hit his left side including the leg during the  fall. He was discharged home, he walked to bed. During the course of the evening he developed increasing pain in the left lower extremity, eventually he stated he could not walk due to pain. He also developed hematoma around the left eye and bruising on the left hand. The family brought him to the ER. History provided by patient and family. The patient has moderate memory loss.   Active Hospital Problems  Diagnoses Date Noted   . Anticoagulant long-term use: No change to his coumadin follow up on wenesday. 02/17/2011   . VITAMIN B12 DEFICIENCY: No change 05/02/2006   . NEUROPATHY-PERIPHERAL: No change. 04/04/2006     Resolved Hospital Problems  Diagnoses Date Noted Date Resolved  . Hematoma: his Coumadin has held. Hbg was follow closely and it remained stable. He was d/c home with with his regular dose of coumadin and follow up in 2-3 days with the coumadin clinic. No fractures seen on Ct of hip. He was given oxycodone for pain. 02/17/2011 02/18/2011     Disposition and Follow-up:  Discharge Orders    Future Appointments: Provider: Department: Dept Phone: Center:   04/12/2011 1:05 PM Amber Caryl Bis, RN [facility] [phone] [facility]   05/07/2011 10:30 AM Randall An, MD [facility] [phone] None     Future Orders Please Complete By Expires   Diet - low sodium heart healthy      Increase activity slowly      Home Health      Questions: Responses:   To provide the following care/treatments PT   Face-to-face encounter      Comments:   I David Stall, Tashaya Ancrum certify that this patient is under my care and that I, or a nurse practitioner or physician's assistant working with me, had a face-to-face encounter that meets the physician face-to-face encounter requirements with this patient on 02/18/2011.       Questions: Responses:   The encounter with the patient was in whole, or in part, for the following medical condition, which is the primary reason for home  health care hematoma   I certify that, based on my findings, the following services are medically necessary home health services Physical therapy   My clinical findings support the need for the above services High Risk for rehospitalization   Further, I certify that my clinical findings support that this patient is homebound (i.e. absences from home require considerable and taxing effort and are for medical reasons or religious services or infrequently or of short duration when for other reasons) Ambulates short distances less than 300 feet   To provide the following care/treatments PT     Follow-up Information    Follow up with [facility], MD in 1 week. (As needed)           DISCHARGE EXAM:  General: Alert, awake, oriented x3, in no acute distress.  HEENT: No bruits, no goiter. Bruise in the right side of head no eye involvement.  Heart: Regular rate and rhythm, without murmurs, rubs, gallops.  Lungs: good air movement CTA b/l  Abdomen: Soft, nontender, nondistended, positive bowel sounds.  Neuro: Grossly intact, nonfocal.   Blood pressure 164/73, pulse 63, temperature 98 F (36.7 C), temperature source Oral, resp. rate 18, height 5\' 9"  (1.753 m), weight 74.7 kg (164 lb 10.9 oz), SpO2 97.00%.   Basename 02/17/11 0442 02/16/11 2137  NA 136 138  K 3.7 3.6  CL 104 103  CO2 24 24  GLUCOSE 131* 114*  BUN 18 19  CREATININE 1.60* 1.79*  CALCIUM 8.1* 8.5  MG -- --  PHOS -- --   No results found for this basename: AST:2,ALT:2,ALKPHOS:2,BILITOT:2,PROT:2,ALBUMIN:2 in the last 72 hours No results found for this basename: LIPASE:2,AMYLASE:2 in the last 72 hours  Basename 02/18/11 0606 02/17/11 1139 02/17/11 0442 02/16/11 2137  WBC 7.3 -- 7.2 --  NEUTROABS -- -- -- 5.3  HGB 11.3* 11.1* -- --  HCT 34.6* 33.8* -- --  MCV 88.3 -- 88.2 --  PLT 232 -- 213 --    Signed: Marinda Elk M.D. 02/18/2011, 9:50 AM

## 2011-02-18 NOTE — Progress Notes (Signed)
Gave pt discharge instructions and prescription and explained them to him. Pt verbalized understanding of instructions given. Pt left via wheelchair with tech in no acute distress.

## 2011-02-18 NOTE — Evaluation (Signed)
Physical Therapy Evaluation Patient Details Name: Justin Lang MRN: [id number] DOB: 1923-01-08 Today's Date: 02/18/2011 [id number], Ev1  Problem List:  Patient Active Problem List  Diagnoses  . VITAMIN B12 DEFICIENCY  . HYPOVOLEMIA  . FACTOR V DEFICIENCY  . NEUROPATHY-PERIPHERAL  . CATARACT NOS  . VERTIGO SECONDARY TO LABRYNTHITIS  . GASTROENTERITIS, ACUTE  . OVERACTIVE BLADDER  . ARTHRITIS  . Pain in Soft Tissues of Limb  . Memory loss  . HEADACHE  . NAUSEA AND VOMITING  . VOMITING  . DIARRHEA  . ABDOMINAL PAIN, UNSPECIFIED SITE  . ELEVATED BLOOD PRESSURE  . COLON CANCER, HX OF  . DVT, HX OF  . NEPHROLITHIASIS, HX OF  . GREENFIELD FILTER INSERTION, HX OF  . Pacemaker  . Anticoagulant long-term use    Past Medical History:  Past Medical History  Diagnosis Date  . Colon cancer     hx of metastic- treated with surgery and readiation  . DVT (deep venous thrombosis)     hx of s/p Greenfield filter  . OAB (overactive bladder)   . Arthritis   . Cataracts, bilateral   . FH: factor V Leiden deficiency   . History of nephrolithiasis   . History of ankle fracture   . 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  . Peripheral neuropathy   . Vitamin B12 deficiency   . Symptomatic bradycardia    Past Surgical History:  Past Surgical History  Procedure Date  . Colectomy     sigmoid secondary to colon ca- 1997  . Exploratory laparotomy     metastatic retroperitoneal lymph node- 2002  . Total knee arthroplasty     right-1991; left-1992  . Back surgeries     x3  . Filter replacement   . Left ankle surgery   . Incisional herniorraphy   . Pacemaker insertion   . Replacement total knee bilateral   . Colon resection   . Colonoscopy 08/18/2010    Procedure: COLONOSCOPY;  Surgeon: Malissa Hippo, MD;  [facility];  Service: Endoscopy;  Laterality: N/A;  . Esophagogastroduodenoscopy 08/18/2010    Procedure: ESOPHAGOGASTRODUODENOSCOPY (EGD);  Surgeon:  Malissa Hippo, MD;  Location: [facility];  Service: Endoscopy;  Laterality: N/A;    PT Assessment/Plan/Recommendation PT Assessment Clinical Impression Statement: Pt about to be discharged from hospital, but needed PT eval first.  Pt did well and recommend HHPT. PT Recommendation/Assessment: All further PT needs can be met in the next venue of care No Skilled PT: All education completed;Other (comment) (Pt being d/c'ed from hospital) PT Recommendation Follow Up Recommendations: Home health PT Equipment Recommended: None recommended by PT     PT Evaluation Precautions/Restrictions  Precautions Precautions: Fall Restrictions Weight Bearing Restrictions: No Prior Functioning  Home Living Lives With: Spouse Receives Help From: Family Type of Home: House Home Layout: Two level;Able to live on main level with bedroom/bathroom Home Access: Stairs to enter Entrance Stairs-Rails: Can reach both Entrance Stairs-Number of Steps: 3 Bathroom Toilet: Handicapped height Home Adaptive Equipment: Walker - rolling Prior Function Level of Independence: Independent with gait Cognition Cognition Arousal/Alertness: Awake/alert Overall Cognitive Status: Appears within functional limits for tasks assessed Sensation/Coordination Coordination Gross Motor Movements are Fluid and Coordinated: Yes Extremity Assessment RLE Assessment RLE Assessment: Within Functional Limits LLE Assessment LLE Assessment: Within Functional Limits Mobility (including Balance) Transfers Transfers: Yes Sit to Stand: 6: Modified independent (Device/Increase time);From chair/3-in-1;With armrests;With upper extremity assist Ambulation/Gait Ambulation/Gait: Yes Ambulation/Gait Assistance: 6: Modified independent (Device/Increase  time);5: Supervision Ambulation Distance (Feet): 100 Feet Assistive device: Rolling walker Gait Pattern: Decreased stance time - left Stairs:  (educated on proper sequencing)         End of Session PT - End of Session Activity Tolerance: Patient tolerated treatment well Patient left: in chair;with family/visitor present Nurse Communication: Mobility status for ambulation;Other (comment) (ready for d/c) General Behavior During Session: [facility] for tasks performed Cognition: [facility] for tasks performed  [facility] LUBECK 02/18/2011, 11:03 AM

## 2011-02-21 ENCOUNTER — Encounter (HOSPITAL_COMMUNITY): Payer: Medicare Other | Attending: Oncology

## 2011-02-21 ENCOUNTER — Other Ambulatory Visit (HOSPITAL_COMMUNITY): Payer: Self-pay | Admitting: Oncology

## 2011-02-21 ENCOUNTER — Telehealth (HOSPITAL_COMMUNITY): Payer: Self-pay | Admitting: *Deleted

## 2011-02-21 DIAGNOSIS — D682 Hereditary deficiency of other clotting factors: Secondary | ICD-10-CM | POA: Insufficient documentation

## 2011-02-21 DIAGNOSIS — E538 Deficiency of other specified B group vitamins: Secondary | ICD-10-CM

## 2011-02-21 LAB — PROTIME-INR: INR: 1.64 — ABNORMAL HIGH (ref 0.00–1.49)

## 2011-02-21 NOTE — Telephone Encounter (Signed)
Informed Justin Lang to keep [name] on same dose coumadin and pt/inr on Tuesday.

## 2011-02-27 ENCOUNTER — Other Ambulatory Visit (HOSPITAL_COMMUNITY): Payer: Self-pay | Admitting: Oncology

## 2011-02-27 ENCOUNTER — Encounter (HOSPITAL_BASED_OUTPATIENT_CLINIC_OR_DEPARTMENT_OTHER): Payer: Medicare Other

## 2011-02-27 ENCOUNTER — Telehealth (HOSPITAL_COMMUNITY): Payer: Self-pay | Admitting: *Deleted

## 2011-02-27 DIAGNOSIS — D682 Hereditary deficiency of other clotting factors: Secondary | ICD-10-CM

## 2011-02-27 DIAGNOSIS — Z86718 Personal history of other venous thrombosis and embolism: Secondary | ICD-10-CM

## 2011-02-27 LAB — PROTIME-INR
INR: 2.28 — ABNORMAL HIGH (ref 0.00–1.49)
Prothrombin Time: 25.5 seconds — ABNORMAL HIGH (ref 11.6–15.2)

## 2011-02-27 NOTE — Telephone Encounter (Signed)
Spoke to pt's wife [name]. Instruct same dose coumadin and recheck pt/inr in 10 days. Verbalized understanding.

## 2011-02-27 NOTE — Telephone Encounter (Signed)
Message copied by Dennie Maizes on Tue Feb 27, 2011  1:01 PM ------      Message from: Ellouise Newer III      Created: Tue Feb 27, 2011 12:43 PM       Same dose            PT/INR in 10 days

## 2011-02-27 NOTE — Progress Notes (Signed)
Labs drawn today for pt.  Patient on 8 and 9mg  alternating.  Call patient ay [phone].

## 2011-03-09 ENCOUNTER — Other Ambulatory Visit (HOSPITAL_COMMUNITY): Payer: Medicare Other

## 2011-03-12 ENCOUNTER — Encounter (HOSPITAL_COMMUNITY): Payer: Medicare Other

## 2011-03-12 ENCOUNTER — Other Ambulatory Visit (HOSPITAL_COMMUNITY): Payer: Self-pay | Admitting: Oncology

## 2011-03-12 DIAGNOSIS — D682 Hereditary deficiency of other clotting factors: Secondary | ICD-10-CM

## 2011-03-12 NOTE — Progress Notes (Signed)
Labs drawn today for pt. Patient on 8.5mg of coud.  Call patient at [phone] 

## 2011-03-16 ENCOUNTER — Encounter (HOSPITAL_COMMUNITY): Payer: Medicare Other | Attending: Oncology

## 2011-03-16 ENCOUNTER — Other Ambulatory Visit (HOSPITAL_COMMUNITY): Payer: Self-pay | Admitting: Oncology

## 2011-03-16 ENCOUNTER — Telehealth (HOSPITAL_COMMUNITY): Payer: Self-pay | Admitting: *Deleted

## 2011-03-16 DIAGNOSIS — D682 Hereditary deficiency of other clotting factors: Secondary | ICD-10-CM | POA: Insufficient documentation

## 2011-03-16 DIAGNOSIS — Z7901 Long term (current) use of anticoagulants: Secondary | ICD-10-CM | POA: Insufficient documentation

## 2011-03-16 NOTE — Telephone Encounter (Signed)
Message copied by Dennie Maizes on Fri Mar 16, 2011 12:55 PM ------      Message from: Ellouise Newer III      Created: Fri Mar 16, 2011 12:37 PM       Same dose            PT/INR in 1 week

## 2011-03-16 NOTE — Telephone Encounter (Signed)
Spoke with Justin Lang as below. Instruct to continue same dose Coumadin 8.5 mg daily. RTC in 1 week for recheck. Verbalized understanding.

## 2011-03-21 NOTE — Progress Notes (Signed)
Lab draw

## 2011-03-23 ENCOUNTER — Encounter (HOSPITAL_COMMUNITY): Payer: Medicare Other

## 2011-03-23 DIAGNOSIS — D682 Hereditary deficiency of other clotting factors: Secondary | ICD-10-CM

## 2011-03-23 LAB — PROTIME-INR: INR: 2.66 — ABNORMAL HIGH (ref 0.00–1.49)

## 2011-03-27 ENCOUNTER — Other Ambulatory Visit (HOSPITAL_COMMUNITY): Payer: Self-pay | Admitting: *Deleted

## 2011-03-27 DIAGNOSIS — D682 Hereditary deficiency of other clotting factors: Secondary | ICD-10-CM

## 2011-03-27 NOTE — Progress Notes (Signed)
Pt called inquiring about pt/inr results from Friday 3/8. Results reviewed by T.Kefalas, PA. Instructed pt to continue same dose Coumadin 8.5 mg daily and RTC 3/15 for recheck pt/inr. Pt verbalized understanding.

## 2011-03-30 ENCOUNTER — Other Ambulatory Visit (HOSPITAL_COMMUNITY): Payer: Self-pay | Admitting: Oncology

## 2011-03-30 ENCOUNTER — Telehealth (HOSPITAL_COMMUNITY): Payer: Self-pay | Admitting: *Deleted

## 2011-03-30 ENCOUNTER — Encounter (HOSPITAL_BASED_OUTPATIENT_CLINIC_OR_DEPARTMENT_OTHER): Payer: Medicare Other

## 2011-03-30 DIAGNOSIS — D682 Hereditary deficiency of other clotting factors: Secondary | ICD-10-CM

## 2011-03-30 LAB — PROTIME-INR
INR: 2.36 — ABNORMAL HIGH (ref 0.00–1.49)
Prothrombin Time: 26.2 seconds — ABNORMAL HIGH (ref 11.6–15.2)

## 2011-03-30 NOTE — Telephone Encounter (Signed)
Spoke with [name]. Instruct continue Coumadin 8.5 mg daily. RTC 3/22 for pt/inr. Verbalized understanding.

## 2011-03-30 NOTE — Telephone Encounter (Signed)
Message copied by Dennie Maizes on Fri Mar 30, 2011 12:02 PM ------      Message from: Ellouise Newer III      Created: Fri Mar 30, 2011 11:32 AM       Same dose            PT/INR in 1 week

## 2011-04-06 ENCOUNTER — Encounter (HOSPITAL_BASED_OUTPATIENT_CLINIC_OR_DEPARTMENT_OTHER): Payer: Medicare Other

## 2011-04-06 ENCOUNTER — Other Ambulatory Visit (HOSPITAL_COMMUNITY): Payer: Self-pay | Admitting: Oncology

## 2011-04-06 DIAGNOSIS — D682 Hereditary deficiency of other clotting factors: Secondary | ICD-10-CM

## 2011-04-06 LAB — PROTIME-INR
INR: 2.21 — ABNORMAL HIGH (ref 0.00–1.49)
Prothrombin Time: 24.9 seconds — ABNORMAL HIGH (ref 11.6–15.2)

## 2011-04-06 NOTE — Progress Notes (Signed)
Labs drawn today for pt. Patient on 8.5mg of coud.  Call patient at [phone] 

## 2011-04-12 ENCOUNTER — Encounter: Payer: Medicare Other | Admitting: *Deleted

## 2011-04-16 ENCOUNTER — Other Ambulatory Visit (HOSPITAL_COMMUNITY): Payer: Self-pay | Admitting: Oncology

## 2011-04-16 ENCOUNTER — Telehealth (HOSPITAL_COMMUNITY): Payer: Self-pay | Admitting: *Deleted

## 2011-04-16 ENCOUNTER — Encounter (HOSPITAL_COMMUNITY): Payer: Medicare Other | Attending: Oncology

## 2011-04-16 DIAGNOSIS — D682 Hereditary deficiency of other clotting factors: Secondary | ICD-10-CM

## 2011-04-16 LAB — PROTIME-INR: Prothrombin Time: 26.9 seconds — ABNORMAL HIGH (ref 11.6–15.2)

## 2011-04-16 NOTE — Telephone Encounter (Signed)
Message copied by Dennie Maizes on Mon Apr 16, 2011 10:45 AM ------      Message from: Ellouise Newer III      Created: Mon Apr 16, 2011 10:03 AM       Same dose            PT/INR in 14 days

## 2011-04-16 NOTE — Telephone Encounter (Signed)
Message left for pt to continue same dose of Coumadin and RTC 4/16 for pt/inr. Instruct pt to return call to verify understanding of instructions.

## 2011-04-17 ENCOUNTER — Encounter: Payer: Self-pay | Admitting: *Deleted

## 2011-04-24 ENCOUNTER — Ambulatory Visit (INDEPENDENT_AMBULATORY_CARE_PROVIDER_SITE_OTHER): Payer: Medicare Other | Admitting: *Deleted

## 2011-04-24 ENCOUNTER — Encounter: Payer: Self-pay | Admitting: Internal Medicine

## 2011-04-24 DIAGNOSIS — I495 Sick sinus syndrome: Secondary | ICD-10-CM

## 2011-04-25 NOTE — Progress Notes (Signed)
Labs drawn today

## 2011-04-27 LAB — REMOTE PACEMAKER DEVICE
AL IMPEDENCE PM: 464 Ohm
ATRIAL PACING PM: 70.18
BAMS-0001: 170 {beats}/min
BATTERY VOLTAGE: 3 V
RV LEAD IMPEDENCE PM: 424 Ohm
VENTRICULAR PACING PM: 1.11

## 2011-05-01 ENCOUNTER — Encounter (HOSPITAL_BASED_OUTPATIENT_CLINIC_OR_DEPARTMENT_OTHER): Payer: Medicare Other

## 2011-05-01 ENCOUNTER — Encounter (HOSPITAL_COMMUNITY): Payer: Self-pay

## 2011-05-01 ENCOUNTER — Other Ambulatory Visit (HOSPITAL_COMMUNITY): Payer: Self-pay | Admitting: Oncology

## 2011-05-01 DIAGNOSIS — D682 Hereditary deficiency of other clotting factors: Secondary | ICD-10-CM

## 2011-05-01 NOTE — Progress Notes (Signed)
Lab draw

## 2011-05-03 ENCOUNTER — Encounter: Payer: Self-pay | Admitting: *Deleted

## 2011-05-04 NOTE — Progress Notes (Signed)
Remote pacer check  

## 2011-05-04 NOTE — Progress Notes (Signed)
Labs drawn

## 2011-05-06 ENCOUNTER — Other Ambulatory Visit (HOSPITAL_COMMUNITY): Payer: Self-pay | Admitting: Oncology

## 2011-05-07 ENCOUNTER — Encounter (HOSPITAL_BASED_OUTPATIENT_CLINIC_OR_DEPARTMENT_OTHER): Payer: Medicare Other | Admitting: Oncology

## 2011-05-07 VITALS — BP 174/75 | HR 66 | Temp 97.7°F

## 2011-05-07 DIAGNOSIS — L989 Disorder of the skin and subcutaneous tissue, unspecified: Secondary | ICD-10-CM

## 2011-05-07 DIAGNOSIS — R609 Edema, unspecified: Secondary | ICD-10-CM

## 2011-05-07 DIAGNOSIS — Z85038 Personal history of other malignant neoplasm of large intestine: Secondary | ICD-10-CM

## 2011-05-07 DIAGNOSIS — D682 Hereditary deficiency of other clotting factors: Secondary | ICD-10-CM

## 2011-05-07 NOTE — Progress Notes (Signed)
DIAGNOSES: 1. History of metastatic recurrent colon cancer status post     chemotherapy and radiation therapy preoperatively prior to his     resection at [facility] on 12/10/2000, thus far     without recurrence. 2. Deep vein thrombosis and recurrent pulmonary emboli on multiple     occasions, now on lifelong Coumadin.  He had a negative     hypercoagulable panel. 3. Pacemaker insertion for heart disease. 4. Intermittent small bowel obstructions and surgery. 5. Mild memory loss which may just be small vessel disease. 6. Intermittent small bowel obstructions secondary to his surgery. 7. Colon cancer originally diagnosed in 1997 with attempted adjuvant     chemotherapy with 5-FU based therapy which he could not tolerate. 8. Herpes zoster L5-S1 September 2009. 9. Alternating bouts of diarrhea and constipation, not new or     different. 10.Root canal in recent years. 11.Bilateral carotid sensitivity syndrome with syncope from age 55 at     age 50 with a negative workup at [facility]. 12.Bilateral knee replacements with chronic mild lower extremity     edema. [name] is here today with his wife [name].  He has actually done pretty well.  He is still very slow moving around.  Larey Seat actually on the 1st of February which caused some bruising to his face and what sounds like a bleed into his vastus intermedius of the left leg requiring admission to [facility] followed by cessation of use of Coumadin for about 3 days and he is back on it and perfectly therapeutic now on 8.5 mg a day.  His vital signs are very stable.  His appetite is good.  His sense of well-being is good.  His bowels are still very active today, but there is no hepatosplenomegaly, no distention, no tenderness.  He has bilateral 1+ pitting edema of the legs, slightly worse on the left than the right. He has had ankle problems on that left side as well worked on by Dr. Lajoyce Corners.  His lungs are clear.  His heart  shows a fairly regular rhythm with his pacemaker.  I did not detect an S3 gallop or distinct murmur. His lymph node exam is negative throughout.  Skin exam:  There is an area of irritation and irregularity over the left upper eyebrow, so he will see Dr. Nita Sells for a consultation.  His wife will make the appointment.  He had blood work on February 1st.  I do not think we need to repeat that.  We will see him in 6 months.  We will continue to do his INRs.  We stopped doing CEAs because they were not helpful when he had recurrent disease.    ______________________________ Ladona Horns. Mariel Sleet, MD ESN/MEDQ  D:  05/07/2011  T:  05/07/2011  Job:  [id number]

## 2011-05-07 NOTE — Progress Notes (Signed)
Labs drawn

## 2011-05-07 NOTE — Progress Notes (Signed)
This office note has been dictated.

## 2011-05-07 NOTE — Patient Instructions (Signed)
Justin Lang  [id number] [birth year] Dr. Glenford Peers   [facility]  Discharge Instructions  RECOMMENDATIONS MADE BY THE CONSULTANT AND ANY TEST RESULTS WILL BE SENT TO YOUR REFERRING DOCTOR.   EXAM FINDINGS BY MD TODAY AND SIGNS AND SYMPTOMS TO REPORT TO CLINIC OR PRIMARY MD: Exam and discussion per MD.  You are doing well.  No evidence of recurrence by exam.  MEDICATIONS PRESCRIBED: none   INSTRUCTIONS GIVEN AND DISCUSSED: Other:  Report changes in bowel habits, blood in bowel movement, any unusual bruising or bleeding.  SPECIAL INSTRUCTIONS/FOLLOW-UP: Lab work Needed as scheduled  and Return to Clinic in 6 months to see MD.   I acknowledge that I have been informed and understand all the instructions given to me and received a copy. I do not have any more questions at this time, but understand that I may call the Specialty Clinic at [facility] at [phone] during business hours should I have any further questions or need assistance in obtaining follow-up care.    __________________________________________  _____________  __________ Signature of Patient or Authorized Representative            Date                   Time    __________________________________________ Nurse's Signature

## 2011-05-15 ENCOUNTER — Other Ambulatory Visit (HOSPITAL_COMMUNITY): Payer: Self-pay | Admitting: Oncology

## 2011-05-15 ENCOUNTER — Encounter (HOSPITAL_BASED_OUTPATIENT_CLINIC_OR_DEPARTMENT_OTHER): Payer: Medicare Other

## 2011-05-15 DIAGNOSIS — D682 Hereditary deficiency of other clotting factors: Secondary | ICD-10-CM

## 2011-05-15 LAB — PROTIME-INR
INR: 2.43 — ABNORMAL HIGH (ref 0.00–1.49)
Prothrombin Time: 26.8 seconds — ABNORMAL HIGH (ref 11.6–15.2)

## 2011-05-15 NOTE — Progress Notes (Signed)
Labs for pt level today and instructions on coumadin called to pt.

## 2011-05-29 ENCOUNTER — Other Ambulatory Visit (HOSPITAL_COMMUNITY): Payer: Self-pay | Admitting: Oncology

## 2011-05-29 ENCOUNTER — Encounter (HOSPITAL_COMMUNITY): Payer: Medicare Other | Attending: Oncology

## 2011-05-29 DIAGNOSIS — D682 Hereditary deficiency of other clotting factors: Secondary | ICD-10-CM | POA: Insufficient documentation

## 2011-05-29 LAB — PROTIME-INR
INR: 2.53 — ABNORMAL HIGH (ref 0.00–1.49)
Prothrombin Time: 27.7 seconds — ABNORMAL HIGH (ref 11.6–15.2)

## 2011-05-29 NOTE — Progress Notes (Signed)
Labs today

## 2011-06-01 ENCOUNTER — Inpatient Hospital Stay (HOSPITAL_COMMUNITY)
Admission: EM | Admit: 2011-06-01 | Discharge: 2011-06-04 | DRG: 389 | Disposition: A | Discharge status: Home / Self Care | Payer: Medicare Other | Attending: General Surgery | Admitting: General Surgery

## 2011-06-01 ENCOUNTER — Emergency Department (HOSPITAL_COMMUNITY): Payer: Medicare Other

## 2011-06-01 ENCOUNTER — Encounter (HOSPITAL_COMMUNITY): Payer: Self-pay | Admitting: *Deleted

## 2011-06-01 DIAGNOSIS — K56609 Unspecified intestinal obstruction, unspecified as to partial versus complete obstruction: Secondary | ICD-10-CM

## 2011-06-01 DIAGNOSIS — Z7901 Long term (current) use of anticoagulants: Secondary | ICD-10-CM

## 2011-06-01 DIAGNOSIS — D6859 Other primary thrombophilia: Secondary | ICD-10-CM | POA: Diagnosis present

## 2011-06-01 DIAGNOSIS — Z950 Presence of cardiac pacemaker: Secondary | ICD-10-CM

## 2011-06-01 DIAGNOSIS — I2510 Atherosclerotic heart disease of native coronary artery without angina pectoris: Secondary | ICD-10-CM | POA: Diagnosis present

## 2011-06-01 HISTORY — DX: Presence of cardiac pacemaker: Z95.0

## 2011-06-01 LAB — LIPASE, BLOOD: Lipase: 25 U/L (ref 11–59)

## 2011-06-01 LAB — COMPREHENSIVE METABOLIC PANEL
AST: 16 U/L (ref 0–37)
Albumin: 3.3 g/dL — ABNORMAL LOW (ref 3.5–5.2)
BUN: 20 mg/dL (ref 6–23)
Calcium: 9.4 mg/dL (ref 8.4–10.5)
Creatinine, Ser: 1.57 mg/dL — ABNORMAL HIGH (ref 0.50–1.35)
GFR calc non Af Amer: 38 mL/min — ABNORMAL LOW (ref >90)
Total Bilirubin: 0.5 mg/dL (ref 0.3–1.2)

## 2011-06-01 LAB — DIFFERENTIAL
Basophils Absolute: 0 10*3/uL (ref 0.0–0.1)
Basophils Relative: 0 % (ref 0–1)
Eosinophils Relative: 2 % (ref 0–5)
Monocytes Absolute: 0.6 10*3/uL (ref 0.1–1.0)
Monocytes Relative: 7 % (ref 3–12)

## 2011-06-01 LAB — CBC
HCT: 44.5 % (ref 39.0–52.0)
Hemoglobin: 14.4 g/dL (ref 13.0–17.0)
MCH: 28.9 pg (ref 26.0–34.0)
MCHC: 32.4 g/dL (ref 30.0–36.0)
MCV: 89.2 fL (ref 78.0–100.0)
RDW: 14.9 % (ref 11.5–15.5)

## 2011-06-01 LAB — PROTIME-INR: Prothrombin Time: 22 seconds — ABNORMAL HIGH (ref 11.6–15.2)

## 2011-06-01 LAB — LACTIC ACID, PLASMA: Lactic Acid, Venous: 1.4 mmol/L (ref 0.5–2.2)

## 2011-06-01 MED ORDER — PANTOPRAZOLE SODIUM 40 MG IV SOLR
40.0000 mg | Freq: Every day | INTRAVENOUS | Status: DC
  Administered 2011-06-01 – 2011-06-02 (×2): 40 mg via INTRAVENOUS
  Filled 2011-06-01 (×2): qty 40

## 2011-06-01 MED ORDER — KCL IN DEXTROSE-NACL 20-5-0.45 MEQ/L-%-% IV SOLN
INTRAVENOUS | Status: DC
  Administered 2011-06-01 – 2011-06-04 (×4): via INTRAVENOUS

## 2011-06-01 MED ORDER — IOHEXOL 300 MG/ML  SOLN
80.0000 mL | Freq: Once | INTRAMUSCULAR | Status: AC | PRN
  Administered 2011-06-01: 80 mL via INTRAVENOUS

## 2011-06-01 MED ORDER — MORPHINE SULFATE 4 MG/ML IJ SOLN
4.0000 mg | Freq: Once | INTRAMUSCULAR | Status: AC
  Administered 2011-06-01: 4 mg via INTRAVENOUS
  Filled 2011-06-01: qty 1

## 2011-06-01 MED ORDER — ONDANSETRON HCL 4 MG/2ML IJ SOLN: 4.0000 mg | Freq: Four times a day (QID) | INTRAMUSCULAR | Status: DC | PRN

## 2011-06-01 MED ORDER — SODIUM CHLORIDE 0.9 % IV BOLUS (SEPSIS)
1000.0000 mL | Freq: Once | INTRAVENOUS | Status: AC
  Administered 2011-06-01: 1000 mL via INTRAVENOUS

## 2011-06-01 MED ORDER — DIPHENHYDRAMINE HCL 12.5 MG/5ML PO ELIX: 12.5000 mg | ORAL_SOLUTION | Freq: Four times a day (QID) | ORAL | Status: DC | PRN

## 2011-06-01 MED ORDER — DIPHENHYDRAMINE HCL 50 MG/ML IJ SOLN: 12.5000 mg | Freq: Four times a day (QID) | INTRAMUSCULAR | Status: DC | PRN

## 2011-06-01 MED ORDER — ONDANSETRON HCL 4 MG/2ML IJ SOLN
4.0000 mg | Freq: Once | INTRAMUSCULAR | Status: AC
  Administered 2011-06-01: 4 mg via INTRAVENOUS
  Filled 2011-06-01: qty 2

## 2011-06-01 MED ORDER — ENOXAPARIN SODIUM 80 MG/0.8ML ~~LOC~~ SOLN
1.0000 mg/kg | Freq: Two times a day (BID) | SUBCUTANEOUS | Status: DC
  Administered 2011-06-01: 75 mg via SUBCUTANEOUS
  Filled 2011-06-01: qty 0.8

## 2011-06-01 MED ORDER — SODIUM CHLORIDE 0.9 % IJ SOLN
INTRAMUSCULAR | Status: AC
  Administered 2011-06-01: 10 mL
  Filled 2011-06-01: qty 3

## 2011-06-01 MED ORDER — MORPHINE SULFATE 2 MG/ML IJ SOLN: 2.0000 mg | INTRAMUSCULAR | Status: DC | PRN

## 2011-06-01 NOTE — ED Notes (Addendum)
Pt alert & made aware of procedure. Pt tolerated well. Placement verified by osculation & aspiration. Ng attached to LIS.

## 2011-06-01 NOTE — ED Notes (Signed)
Pt states he has had an intermittent intestinal blockage for years and began having symptoms 5 days ago and vomiting began today.

## 2011-06-01 NOTE — ED Notes (Signed)
Pt able to drink CT contrast and tolerating well.  nad noted.

## 2011-06-01 NOTE — H&P (Signed)
Justin Lang is an 78 y.o. male.   Chief Complaint: Nausea and vomiting  HPI: Patient is an 78 year old white male well known to me who presents with a two-day history of worsening nausea and vomiting. Also states he had a bloody bowel movement. He has had multiple episodes of this in the past 2 to small bowel obstruction, all of which have recently resolved with nasogastric tube decompression. He is status post a sigmoid colectomy in 1997 with recurrence of disease in 2002, operated on at [facility]. He is chronically anticoagulated do to factor V Leiden deficiency. He also had a pacemaker inserted approximately 2 years ago due to bradycardic episodes. He denies any significant abdominal pain at this point. Past Medical History  Diagnosis Date  . Colon cancer     hx of metastic- treated with surgery and readiation  . DVT (deep venous thrombosis)     hx of s/p Greenfield filter  . OAB (overactive bladder)   . Arthritis   . Cataracts, bilateral   . FH: factor V Leiden deficiency   . History of nephrolithiasis   . History of ankle fracture   . 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  . Peripheral neuropathy   . Vitamin B12 deficiency   . Symptomatic bradycardia     Past Surgical History  Procedure Date  . Colectomy     sigmoid secondary to colon ca- 1997  . Exploratory laparotomy     metastatic retroperitoneal lymph node- 2002  . Total knee arthroplasty     right-1991; left-1992  . Back surgeries     x3  . Filter replacement   . Left ankle surgery   . Incisional herniorraphy   . Pacemaker insertion   . Replacement total knee bilateral   . Colon resection   . Colonoscopy 08/18/2010    Procedure: COLONOSCOPY;  Surgeon: Malissa Hippo, MD;  Location: [facility];  Service: Endoscopy;  Laterality: N/A;  . Esophagogastroduodenoscopy 08/18/2010    Procedure: ESOPHAGOGASTRODUODENOSCOPY (EGD);  Surgeon: Malissa Hippo, MD;  [facility];   Service: Endoscopy;  Laterality: N/A;    Family History  Problem Relation Age of Onset  . Anesthesia problems Neg Hx   . Hypotension Neg Hx   . Malignant hyperthermia Neg Hx   . Pseudochol deficiency Neg Hx   . Cancer Sister    Social History:  reports that he has never smoked. He has never used smokeless tobacco. He reports that he does not drink alcohol or use illicit drugs.  Allergies:  Allergies  Allergen Reactions  . Celecoxib Rash    CELEBREX     (Not in a hospital admission)  Results for orders placed during the hospital encounter of 06/01/11 (from the past 48 hour(s))  CBC     Status: Normal   Collection Time   06/01/11  3:36 PM      Component Value Range Comment   WBC 8.1  4.0 - 10.5 (K/uL)    RBC 4.99  4.22 - 5.81 (MIL/uL)    Hemoglobin 14.4  13.0 - 17.0 (g/dL)    HCT 16.1  09.6 - 04.5 (%)    MCV 89.2  78.0 - 100.0 (fL)    MCH 28.9  26.0 - 34.0 (pg)    MCHC 32.4  30.0 - 36.0 (g/dL)    RDW 40.9  81.1 - 91.4 (%)    Platelets 245  150 - 400 (K/uL)   DIFFERENTIAL  Status: Abnormal   Collection Time   06/01/11  3:36 PM      Component Value Range Comment   Neutrophils Relative 78 (*) 43 - 77 (%)    Neutro Abs 6.2  1.7 - 7.7 (K/uL)    Lymphocytes Relative 14  12 - 46 (%)    Lymphs Abs 1.1  0.7 - 4.0 (K/uL)    Monocytes Relative 7  3 - 12 (%)    Monocytes Absolute 0.6  0.1 - 1.0 (K/uL)    Eosinophils Relative 2  0 - 5 (%)    Eosinophils Absolute 0.1  0.0 - 0.7 (K/uL)    Basophils Relative 0  0 - 1 (%)    Basophils Absolute 0.0  0.0 - 0.1 (K/uL)   COMPREHENSIVE METABOLIC PANEL     Status: Abnormal   Collection Time   06/01/11  3:36 PM      Component Value Range Comment   Sodium 140  135 - 145 (mEq/L)    Potassium 3.8  3.5 - 5.1 (mEq/L)    Chloride 105  96 - 112 (mEq/L)    CO2 24  19 - 32 (mEq/L)    Glucose, Bld 133 (*) 70 - 99 (mg/dL)    BUN 20  6 - 23 (mg/dL)    Creatinine, Ser 4.54 (*) 0.50 - 1.35 (mg/dL)    Calcium 9.4  8.4 - 10.5 (mg/dL)    Total  Protein 7.6  6.0 - 8.3 (g/dL)    Albumin 3.3 (*) 3.5 - 5.2 (g/dL)    AST 16  0 - 37 (U/L)    ALT 7  0 - 53 (U/L)    Alkaline Phosphatase 61  39 - 117 (U/L)    Total Bilirubin 0.5  0.3 - 1.2 (mg/dL)    GFR calc non Af Amer 38 (*) >90 (mL/min)    GFR calc Af Amer 44 (*) >90 (mL/min)   LIPASE, BLOOD     Status: Normal   Collection Time   06/01/11  3:36 PM      Component Value Range Comment   Lipase 25  11 - 59 (U/L)   PROTIME-INR     Status: Abnormal   Collection Time   06/01/11  3:36 PM      Component Value Range Comment   Prothrombin Time 22.0 (*) 11.6 - 15.2 (seconds)    INR 1.89 (*) 0.00 - 1.49    LACTIC ACID, PLASMA     Status: Normal   Collection Time   06/01/11  4:40 PM      Component Value Range Comment   Lactic Acid, Venous 1.4  0.5 - 2.2 (mmol/L)    Ct Abdomen Pelvis W Contrast  06/01/2011  *RADIOLOGY REPORT*  Clinical Data: Abdominal pain and blood in stools.  Vomiting. History of multiple small bowel obstructions.  CT ABDOMEN AND PELVIS WITH CONTRAST  Technique:  Multidetector CT imaging of the abdomen and pelvis was performed following the standard protocol during bolus administration of intravenous contrast.  Contrast: 80mL OMNIPAQUE IOHEXOL 300 MG/ML  SOLN  Comparison: CT abdomen pelvis 10/20 09/2008  Findings: Cardiac pacer leads are present in the right atrium and right ventricle.  There is coronary artery atherosclerotic calcification.  The left ventricle and left atrium are dilated.  There is atelectasis in the dependent portion of both lung bases. Interstitial prominence in the lower lobes bilaterally cannot be excluded.  Mild intrahepatic biliary ductal dilatation is unchanged. Extrahepatic bile duct dilatation measures up to 1.4 cm  at the level the pancreatic head, unchanged.  Three stable circumscribed low density lesions in the liver, the largest measuring 1.8 cm in the right hepatic lobe, are stable, and likely cysts.  No new or suspicious hepatic lesion.  The spleen,  adrenal glands, and pancreas are stable and within normal limits.  Bilateral renal atrophy is unchanged.  There is no hydronephrosis or renal mass.  There is a 3 mm nonobstructing stone in the lower pole of the right kidney.  4 mm stone in the left renal pelvis, with no evidence of hydronephrosis.  Both ureters are normal in caliber.  Chronic urinary bladder stone measures 13 mm, slightly increased from prior study (measured 10 mm in 2010).  Urinary bladder wall thickness is normal.  Prostamegaly is without significant change.  Stomach is distended with oral contrast and air.  There are multiple dilated loops of small bowel, which can also be visualized on the scout view of the CT.  There is a relative transition to normal caliber small bowel loops in the right lower quadrant.  A discrete focal transition point is not detected.  No mass lesion is seen. There is a small amount of fluid in the mesentery of the right lower quadrant on image number 69. No small bowel wall thickening is identified.  There are postsurgical changes in the abdomen, with surgical clips and suture associated with small bowel in the left mid abdomen. Liquid stool is noted in the sigmoid colon and rectum. Diverticulosis of the colon, most prominent in the descending and sigmoid region.  There is no lymphadenopathy or free intraperitoneal air.  Inferior vena cava filter is positioned just below the renal arteries, and is stable.  There is atherosclerotic calcification of the aorta and iliac vasculature.  There is ectasia of the right common iliac arteries, measuring up to 1.7 cm AP diameter.  There are postoperative changes of anterior abdominal wall hernia repair.  There are no herniated bowel loops.  Left inguinal hernia contains fat only.  There are degenerative changes of the thoracolumbar spine. No acute or suspicious bony abnormality.  IMPRESSION:  1.  Small bowel obstruction.  A relative transition point to normal caliber bowel loops in  the right lower quadrant. Obstruction is favored to be due to adhesions. 2.  Small amount of mesenteric fluid in the right lower quadrant, likely secondary to the obstruction. 3.  Single chronic urinary bladder stone is slightly increased in size since prior study of 2010. 4.  Nonobstructing stones in the lower pole the right kidney and the renal pelvis of the left kidney, with stable moderate renal atrophy bilaterally. 5.  Stable chronic intra and extrahepatic biliary ductal dilatation and stable hepatic cysts. 6.  Cardiomegaly with coronary artery atherosclerotic calcification. 7.  Bibasilar atelectasis.  Cannot exclude an element of interstitial lung disease at the bases.  Original Report Authenticated By: Britta Mccreedy, M.D.    Review of Systems  Constitutional: Negative.   HENT: Negative.   Eyes: Negative.   Respiratory: Negative.   Cardiovascular: Negative.   Gastrointestinal: Positive for nausea, vomiting, diarrhea, constipation and blood in stool.  Genitourinary: Negative.   Musculoskeletal: Negative.   Skin: Negative.   Endo/Heme/Allergies: Bruises/bleeds easily.    Blood pressure 185/82, pulse 64, temperature 97.9 F (36.6 C), temperature source Oral, resp. rate 20, height 5\' 9"  (1.753 m), weight 73.029 kg (161 lb), SpO2 95.00%. Physical Exam  Constitutional: He appears well-developed and well-nourished.  HENT:  Head: Normocephalic and atraumatic.  Neck: Normal  range of motion. Neck supple.  Cardiovascular: Normal rate, regular rhythm and normal heart sounds.   Respiratory: Effort normal and breath sounds normal.  GI: Soft. There is no tenderness. There is no rebound.       Midline surgical scars with mesh palpable supraumbilically.  Occassional bowel sounds present.     Assessment/Plan Impression: Partial small bowel obstruction most likely secondary to adhesive disease. He has been followed closely by Dr. Mariel Sleet of oncology. It does not appear to be any significant  recurrent colonic malignancy at this point. He also has multiple medical problems including factor V Leiden deficiency as well as cardiac disease. I will switch him to Lovenox therapeutic injections twice in the hospital. He does not need acute surgical intervention at this point. I have explained the management and treatment plan with the patient and family, who agree with the treatment plan.  Rielyn Krupinski A 06/01/2011, 7:53 PM

## 2011-06-01 NOTE — ED Notes (Signed)
Pt still does not have to urinate at this time. Justin Lang

## 2011-06-01 NOTE — ED Notes (Signed)
Pt vomited up  First bottle of CT contrast.  edp notified.

## 2011-06-01 NOTE — Progress Notes (Signed)
ANTICOAGULATION CONSULT NOTE - Initial Consult  Pharmacy Consult for Lovenox Indication: Factor V Deficiency  Allergies  Allergen Reactions  . Celecoxib Rash    CELEBREX    Patient Measurements: Height: 5\' 9"  (175.3 cm) Weight: 161 lb (73.029 kg) IBW/kg (Calculated) : 70.7   Vital Signs: Temp: 97.9 F (36.6 C) (05/17 1802) Temp src: Oral (05/17 1802) BP: 185/82 mmHg (05/17 1802) Pulse Rate: 64  (05/17 1802)  Labs:  Basename 06/01/11 1536  HGB 14.4  HCT 44.5  PLT 245  APTT --  LABPROT 22.0*  INR 1.89*  HEPARINUNFRC --  CREATININE 1.57*  CKTOTAL --  CKMB --  TROPONINI --    Estimated Creatinine Clearance: 33.8 ml/min (by C-G formula based on Cr of 1.57).   Medical History: Past Medical History  Diagnosis Date  . Colon cancer     hx of metastic- treated with surgery and readiation  . DVT (deep venous thrombosis)     hx of s/p Greenfield filter  . OAB (overactive bladder)   . Arthritis   . Cataracts, bilateral   . FH: factor V Leiden deficiency   . History of nephrolithiasis   . History of ankle fracture   . 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  . Peripheral neuropathy   . Vitamin B12 deficiency   . Symptomatic bradycardia   . Pacemaker     Medications:  Scheduled:    . enoxaparin (LOVENOX) injection  1 mg/kg Subcutaneous Q12H  .  morphine injection  4 mg Intravenous Once  . ondansetron  4 mg Intravenous Once  . ondansetron  4 mg Intravenous Once  . pantoprazole (PROTONIX) IV  40 mg Intravenous QHS  . sodium chloride  1,000 mL Intravenous Once  . sodium chloride        Assessment: Patient on Coumadin for Factor V Deficiency at home Renal Function CrCl > 30   Goal of Therapy:  Full coagulation with Lovenox    Plan:  Lovenox 1 mg/kg every 12 hours CBC Labs per protocol  Raquel James, Javar Eshbach Bennett 06/01/2011,9:54 PM

## 2011-06-01 NOTE — ED Provider Notes (Signed)
History   This chart was scribed for Justin Octave, MD by Charolett Bumpers . The patient was seen in room APA19/[facility]19.    CSN: [id number]  Arrival date & time 06/01/11  1416   First MD Initiated Contact with Patient 06/01/11 1522      Chief Complaint  Patient presents with  . Abdominal Pain  . Emesis    (Consider location/radiation/quality/duration/timing/severity/associated sxs/prior treatment) HPI TOR Justin Lang is a 78 y.o. male who presents to the Emergency Department complaining of constant, moderate abdominal pain since last night. Patient also reports associated emesis and blood in stool that started today. Patient states that he has had 1 bloody BM today, and states that there was bright red blood mixed with stool. Patient states that he vomited X6 times today. Patient states that he has had an intermittent intestinal blockage for years. Patient states that he has been admitted for bowel obstruction, and has had surgery, with the last one being 7 years ago, for bowel obstruction. Patient reports that he has a pacemaker and colon cancer. Patient denies any other abdominal surgeries.    PCP: Dr. Margo Aye  Past Medical History  Diagnosis Date  . Colon cancer     hx of metastic- treated with surgery and readiation  . DVT (deep venous thrombosis)     hx of s/p Greenfield filter  . OAB (overactive bladder)   . Arthritis   . Cataracts, bilateral   . FH: factor V Leiden deficiency   . History of nephrolithiasis   . History of ankle fracture   . 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  . Peripheral neuropathy   . Vitamin B12 deficiency   . Symptomatic bradycardia     Past Surgical History  Procedure Date  . Colectomy     sigmoid secondary to colon ca- 1997  . Exploratory laparotomy     metastatic retroperitoneal lymph node- 2002  . Total knee arthroplasty     right-1991; left-1992  . Back surgeries     x3  . Filter replacement   . Left ankle  surgery   . Incisional herniorraphy   . Pacemaker insertion   . Replacement total knee bilateral   . Colon resection   . Colonoscopy 08/18/2010    Procedure: COLONOSCOPY;  Surgeon: Malissa Hippo, MD;  Location: [facility];  Service: Endoscopy;  Laterality: N/A;  . Esophagogastroduodenoscopy 08/18/2010    Procedure: ESOPHAGOGASTRODUODENOSCOPY (EGD);  Surgeon: Malissa Hippo, MD;  Location: [facility];  Service: Endoscopy;  Laterality: N/A;    Family History  Problem Relation Age of Onset  . Anesthesia problems Neg Hx   . Hypotension Neg Hx   . Malignant hyperthermia Neg Hx   . Pseudochol deficiency Neg Hx   . Cancer Sister     History  Substance Use Topics  . Smoking status: Never Smoker   . Smokeless tobacco: Never Used  . Alcohol Use: No      Review of Systems  Constitutional: Negative for fever.  Respiratory: Positive for shortness of breath.   Cardiovascular: Negative for chest pain.  Gastrointestinal: Positive for nausea, vomiting, abdominal pain and blood in stool.  All other systems reviewed and are negative.    Allergies  Celecoxib  Home Medications   Current Outpatient Rx  Name Route Sig Dispense Refill  . PEPCID PO Oral Take 1 tablet by mouth once as needed. FOR UPSET STOMACH    . FLUTICASONE PROPIONATE 0.05 % EX  CREA Topical Apply 1 application topically daily.    . WARFARIN SODIUM 1 MG PO TABS Oral Take 1 mg by mouth See admin instructions. TAKES DAILY WITH 7.5MG  TABLET TO MAKE TOTAL OF 8.5MG  DOSAGE    . WARFARIN SODIUM 7.5 MG PO TABS Oral Take 7.5 mg by mouth See admin instructions. Take 7.5mg  (1 tablet ) along with 1 mg tablet for a total of 8.5 mg daily      BP 185/82  Pulse 64  Temp(Src) 97.9 F (36.6 C) (Oral)  Resp 20  Ht 5\' 9"  (1.753 m)  Wt 161 lb (73.029 kg)  BMI 23.78 kg/m2  SpO2 95%  Physical Exam  Nursing note and vitals reviewed. Constitutional: He is oriented to person, place, and time. He appears well-developed and  well-nourished. No distress.  HENT:  Head: Normocephalic and atraumatic.  Eyes: EOM are normal. Pupils are equal, round, and reactive to light.  Neck: Normal range of motion. Neck supple. No tracheal deviation present.  Cardiovascular: Normal rate, regular rhythm and normal heart sounds.        Palpable ventral mesh.   Pulmonary/Chest: Effort normal and breath sounds normal. No respiratory distress.  Abdominal: Soft. He exhibits no distension. There is tenderness. There is guarding. There is no rebound.       Moderate diffuse tenderness with voluntary guarding.   Genitourinary: Guaiac negative stool.       White, firm 0.5 area on right scrotum consistent with possible cyst. Non-tender. No fluctuance.   Musculoskeletal: Normal range of motion. He exhibits no edema.  Neurological: He is alert and oriented to person, place, and time. No sensory deficit.  Skin: Skin is warm and dry.  Psychiatric: He has a normal mood and affect. His behavior is normal.    ED Course  Procedures (including critical care time)  DIAGNOSTIC STUDIES: Oxygen Saturation is 98% on room air, normal by my interpretation.    COORDINATION OF CARE:  1530: Discussed planned course of treatment with the patient, who is agreeable at this time.  1545: Medication Orders: Ondansetron (Zofran) injection 4 mg-once; Sodium Chloride 0.9% bolus 1,000 mL-once; Morphine 4 mg/mL injection 4 mg-once. 1630: Medication Orders: Ondansetron (Zofran) injection 4 mg-once.  1855: Recheck: Reevaluation of patient. Patient informed of bowel obstruction and lab results.    Labs Reviewed  DIFFERENTIAL - Abnormal; Notable for the following:    Neutrophils Relative 78 (*)    All other components within normal limits  COMPREHENSIVE METABOLIC PANEL - Abnormal; Notable for the following:    Glucose, Bld 133 (*)    Creatinine, Ser 1.57 (*)    Albumin 3.3 (*)    GFR calc non Af Amer 38 (*)    GFR calc Af Amer 44 (*)    All other components  within normal limits  PROTIME-INR - Abnormal; Notable for the following:    Prothrombin Time 22.0 (*)    INR 1.89 (*)    All other components within normal limits  CBC  LIPASE, BLOOD  LACTIC ACID, PLASMA  URINALYSIS, ROUTINE W REFLEX MICROSCOPIC   Ct Abdomen Pelvis W Contrast  06/01/2011  *RADIOLOGY REPORT*  Clinical Data: Abdominal pain and blood in stools.  Vomiting. History of multiple small bowel obstructions.  CT ABDOMEN AND PELVIS WITH CONTRAST  Technique:  Multidetector CT imaging of the abdomen and pelvis was performed following the standard protocol during bolus administration of intravenous contrast.  Contrast: 80mL OMNIPAQUE IOHEXOL 300 MG/ML  SOLN  Comparison: CT abdomen pelvis 10/20  09/2008  Findings: Cardiac pacer leads are present in the right atrium and right ventricle.  There is coronary artery atherosclerotic calcification.  The left ventricle and left atrium are dilated.  There is atelectasis in the dependent portion of both lung bases. Interstitial prominence in the lower lobes bilaterally cannot be excluded.  Mild intrahepatic biliary ductal dilatation is unchanged. Extrahepatic bile duct dilatation measures up to 1.4 cm at the level the pancreatic head, unchanged.  Three stable circumscribed low density lesions in the liver, the largest measuring 1.8 cm in the right hepatic lobe, are stable, and likely cysts.  No new or suspicious hepatic lesion.  The spleen, adrenal glands, and pancreas are stable and within normal limits.  Bilateral renal atrophy is unchanged.  There is no hydronephrosis or renal mass.  There is a 3 mm nonobstructing stone in the lower pole of the right kidney.  4 mm stone in the left renal pelvis, with no evidence of hydronephrosis.  Both ureters are normal in caliber.  Chronic urinary bladder stone measures 13 mm, slightly increased from prior study (measured 10 mm in 2010).  Urinary bladder wall thickness is normal.  Prostamegaly is without significant change.   Stomach is distended with oral contrast and air.  There are multiple dilated loops of small bowel, which can also be visualized on the scout view of the CT.  There is a relative transition to normal caliber small bowel loops in the right lower quadrant.  A discrete focal transition point is not detected.  No mass lesion is seen. There is a small amount of fluid in the mesentery of the right lower quadrant on image number 69. No small bowel wall thickening is identified.  There are postsurgical changes in the abdomen, with surgical clips and suture associated with small bowel in the left mid abdomen. Liquid stool is noted in the sigmoid colon and rectum. Diverticulosis of the colon, most prominent in the descending and sigmoid region.  There is no lymphadenopathy or free intraperitoneal air.  Inferior vena cava filter is positioned just below the renal arteries, and is stable.  There is atherosclerotic calcification of the aorta and iliac vasculature.  There is ectasia of the right common iliac arteries, measuring up to 1.7 cm AP diameter.  There are postoperative changes of anterior abdominal wall hernia repair.  There are no herniated bowel loops.  Left inguinal hernia contains fat only.  There are degenerative changes of the thoracolumbar spine. No acute or suspicious bony abnormality.  IMPRESSION:  1.  Small bowel obstruction.  A relative transition point to normal caliber bowel loops in the right lower quadrant. Obstruction is favored to be due to adhesions. 2.  Small amount of mesenteric fluid in the right lower quadrant, likely secondary to the obstruction. 3.  Single chronic urinary bladder stone is slightly increased in size since prior study of 2010. 4.  Nonobstructing stones in the lower pole the right kidney and the renal pelvis of the left kidney, with stable moderate renal atrophy bilaterally. 5.  Stable chronic intra and extrahepatic biliary ductal dilatation and stable hepatic cysts. 6.  Cardiomegaly  with coronary artery atherosclerotic calcification. 7.  Bibasilar atelectasis.  Cannot exclude an element of interstitial lung disease at the bases.  Original Report Authenticated By: Britta Mccreedy, M.D.     No diagnosis found.    MDM  History of "intestinal blockage", colon CA, multiple abdominal surgeries presenting with abdominal pain, nausea, vomiting x 2 days.  Reports bloody stool  earlier but FOBT negative.  Labs, CT, UA, IVF, symptom control.  No leukocytosis. Creatinine stable.  Small bowel obstruction on CT scan. Case discussed with Dr. Lovell Sheehan who knows patient well. He came in to evaluate the patient and will admit to his service. NG tube placed. No indication for INR reversal at this point, no emergent surgery indicated.  I personally performed the services described in this documentation, which was scribed in my presence.  The recorded information has been reviewed and considered.      Justin Octave, MD 06/01/11 2351

## 2011-06-02 MED ORDER — SODIUM CHLORIDE 0.9 % IJ SOLN
INTRAMUSCULAR | Status: AC
  Administered 2011-06-02: 10 mL
  Filled 2011-06-02: qty 3

## 2011-06-02 MED ORDER — SODIUM CHLORIDE 0.9 % IJ SOLN
INTRAMUSCULAR | Status: AC
  Filled 2011-06-02: qty 3

## 2011-06-02 MED ORDER — ENOXAPARIN SODIUM 80 MG/0.8ML ~~LOC~~ SOLN
1.0000 mg/kg | Freq: Two times a day (BID) | SUBCUTANEOUS | Status: DC
  Administered 2011-06-02 – 2011-06-04 (×5): 70 mg via SUBCUTANEOUS
  Filled 2011-06-02 (×5): qty 0.8

## 2011-06-02 MED ORDER — MENTHOL 3 MG MT LOZG
1.0000 | LOZENGE | OROMUCOSAL | Status: DC | PRN
  Filled 2011-06-02 (×2): qty 9

## 2011-06-02 NOTE — Progress Notes (Signed)
ANTICOAGULATION CONSULT NOTE - Follow Up Consult  Pharmacy Consult for Lovenox Indication: Factor V Deficiency  Allergies  Allergen Reactions  . Celecoxib Rash    CELEBREX    Patient Measurements: Height: 5\' 10"  (177.8 cm) Weight: 156 lb 15.5 oz (71.2 kg) IBW/kg (Calculated) : 73    Vital Signs: Temp: 97.7 F (36.5 C) (05/18 0600) Temp src: Oral (05/18 0600) BP: 162/70 mmHg (05/18 0600) Pulse Rate: 58  (05/18 0600)  Labs:  Basename 06/01/11 1536  HGB 14.4  HCT 44.5  PLT 245  APTT --  LABPROT 22.0*  INR 1.89*  HEPARINUNFRC --  CREATININE 1.57*  CKTOTAL --  CKMB --  TROPONINI --    Estimated Creatinine Clearance: 34 ml/min (by C-G formula based on Cr of 1.57).   Medications:  Scheduled:    . enoxaparin (LOVENOX) injection  1 mg/kg Subcutaneous Q12H  .  morphine injection  4 mg Intravenous Once  . ondansetron  4 mg Intravenous Once  . ondansetron  4 mg Intravenous Once  . pantoprazole (PROTONIX) IV  40 mg Intravenous QHS  . sodium chloride  1,000 mL Intravenous Once  . sodium chloride      . 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

## 2011-06-02 NOTE — Progress Notes (Signed)
Patient's NG tube was blown out by patient after a bout of sneezing.  Patient has no c/o abdominal pain or nausea vomiting.  Patient had a bowel movement this am.  Marletta Lor, MD about dislodgement of NG tube and was advised to leave NG tube out.

## 2011-06-02 NOTE — Progress Notes (Signed)
Subjective: Complains of the NG tube. Denies abdominal pain. Did have a bowel movement earlier today.  Objective: Vital signs in last 24 hours: Temp:  [97.7 F (36.5 C)-98.2 F (36.8 C)] 97.7 F (36.5 C) (05/18 0600) Pulse Rate:  [58-64] 58  (05/18 0600) Resp:  [20] 20  (05/18 0600) BP: (144-185)/(64-82) 162/70 mmHg (05/18 0600) SpO2:  [91 %-98 %] 96 % (05/18 0600) Weight:  [71.2 kg (156 lb 15.5 oz)-73.029 kg (161 lb)] 71.2 kg (156 lb 15.5 oz) (05/17 2159) Last BM Date: 06/01/11  Intake/Output from previous day: 05/17 0701 - 05/18 0700 In: 800 [I.V.:800] Out: 750 [Emesis/NG output:150] Intake/Output this shift:    General appearance: alert, cooperative and no distress Resp: clear to auscultation bilaterally Cardio: regular rate and rhythm, S1, S2 normal, no murmur, click, rub or gallop GI: Soft, flat. Occasional bowel sounds heard. No rigidity noted. No tenderness noted.  Lab Results:   [facility] 06/01/11 1536  WBC 8.1  HGB 14.4  HCT 44.5  PLT 245   BMET  Basename 06/01/11 1536  NA 140  K 3.8  CL 105  CO2 24  GLUCOSE 133*  BUN 20  CREATININE 1.57*  CALCIUM 9.4   PT/INR  Basename 06/01/11 1536  LABPROT 22.0*  INR 1.89*    Studies/Results: Ct Abdomen Pelvis W Contrast  06/01/2011  *RADIOLOGY REPORT*  Clinical Data: Abdominal pain and blood in stools.  Vomiting. History of multiple small bowel obstructions.  CT ABDOMEN AND PELVIS WITH CONTRAST  Technique:  Multidetector CT imaging of the abdomen and pelvis was performed following the standard protocol during bolus administration of intravenous contrast.  Contrast: 80mL OMNIPAQUE IOHEXOL 300 MG/ML  SOLN  Comparison: CT abdomen pelvis 10/20 09/2008  Findings: Cardiac pacer leads are present in the right atrium and right ventricle.  There is coronary artery atherosclerotic calcification.  The left ventricle and left atrium are dilated.  There is atelectasis in the dependent portion of both lung bases.  Interstitial prominence in the lower lobes bilaterally cannot be excluded.  Mild intrahepatic biliary ductal dilatation is unchanged. Extrahepatic bile duct dilatation measures up to 1.4 cm at the level the pancreatic head, unchanged.  Three stable circumscribed low density lesions in the liver, the largest measuring 1.8 cm in the right hepatic lobe, are stable, and likely cysts.  No new or suspicious hepatic lesion.  The spleen, adrenal glands, and pancreas are stable and within normal limits.  Bilateral renal atrophy is unchanged.  There is no hydronephrosis or renal mass.  There is a 3 mm nonobstructing stone in the lower pole of the right kidney.  4 mm stone in the left renal pelvis, with no evidence of hydronephrosis.  Both ureters are normal in caliber.  Chronic urinary bladder stone measures 13 mm, slightly increased from prior study (measured 10 mm in 2010).  Urinary bladder wall thickness is normal.  Prostamegaly is without significant change.  Stomach is distended with oral contrast and air.  There are multiple dilated loops of small bowel, which can also be visualized on the scout view of the CT.  There is a relative transition to normal caliber small bowel loops in the right lower quadrant.  A discrete focal transition point is not detected.  No mass lesion is seen. There is a small amount of fluid in the mesentery of the right lower quadrant on image number 69. No small bowel wall thickening is identified.  There are postsurgical changes in the abdomen, with surgical clips and suture  associated with small bowel in the left mid abdomen. Liquid stool is noted in the sigmoid colon and rectum. Diverticulosis of the colon, most prominent in the descending and sigmoid region.  There is no lymphadenopathy or free intraperitoneal air.  Inferior vena cava filter is positioned just below the renal arteries, and is stable.  There is atherosclerotic calcification of the aorta and iliac vasculature.  There is  ectasia of the right common iliac arteries, measuring up to 1.7 cm AP diameter.  There are postoperative changes of anterior abdominal wall hernia repair.  There are no herniated bowel loops.  Left inguinal hernia contains fat only.  There are degenerative changes of the thoracolumbar spine. No acute or suspicious bony abnormality.  IMPRESSION:  1.  Small bowel obstruction.  A relative transition point to normal caliber bowel loops in the right lower quadrant. Obstruction is favored to be due to adhesions. 2.  Small amount of mesenteric fluid in the right lower quadrant, likely secondary to the obstruction. 3.  Single chronic urinary bladder stone is slightly increased in size since prior study of 2010. 4.  Nonobstructing stones in the lower pole the right kidney and the renal pelvis of the left kidney, with stable moderate renal atrophy bilaterally. 5.  Stable chronic intra and extrahepatic biliary ductal dilatation and stable hepatic cysts. 6.  Cardiomegaly with coronary artery atherosclerotic calcification. 7.  Bibasilar atelectasis.  Cannot exclude an element of interstitial lung disease at the bases.  Original Report Authenticated By: Britta Mccreedy, M.D.    Anti-infectives: Anti-infectives    None      Assessment/Plan: Impression: Partial small bowel obstruction, slowly resolving. We'll continue nasogastric tube decompression for now. Patient is on therapeutic Lovenox while he is n.p.o. Hopefully may remove nasogastric tube tomorrow.  LOS: 1 day    Romulus Hanrahan A 06/02/2011

## 2011-06-03 LAB — BASIC METABOLIC PANEL
CO2: 26 mEq/L (ref 19–32)
Calcium: 8.8 mg/dL (ref 8.4–10.5)
Creatinine, Ser: 1.44 mg/dL — ABNORMAL HIGH (ref 0.50–1.35)
GFR calc Af Amer: 49 mL/min — ABNORMAL LOW (ref >90)
GFR calc non Af Amer: 42 mL/min — ABNORMAL LOW (ref >90)
Sodium: 136 mEq/L (ref 135–145)

## 2011-06-03 LAB — CBC
Hemoglobin: 14 g/dL (ref 13.0–17.0)
MCH: 28.6 pg (ref 26.0–34.0)
MCHC: 31.8 g/dL (ref 30.0–36.0)
Platelets: 231 10*3/uL (ref 150–400)
RDW: 14.8 % (ref 11.5–15.5)

## 2011-06-03 LAB — PHOSPHORUS: Phosphorus: 2.4 mg/dL (ref 2.3–4.6)

## 2011-06-03 LAB — PROTIME-INR
INR: 1.62 — ABNORMAL HIGH (ref 0.00–1.49)
Prothrombin Time: 19.5 seconds — ABNORMAL HIGH (ref 11.6–15.2)

## 2011-06-03 LAB — MAGNESIUM: Magnesium: 1.8 mg/dL (ref 1.5–2.5)

## 2011-06-03 MED ORDER — WARFARIN SODIUM 10 MG PO TABS
10.0000 mg | ORAL_TABLET | Freq: Once | ORAL | Status: AC
  Administered 2011-06-03: 10 mg via ORAL
  Filled 2011-06-03: qty 1

## 2011-06-03 MED ORDER — PANTOPRAZOLE SODIUM 40 MG PO TBEC
40.0000 mg | DELAYED_RELEASE_TABLET | Freq: Every day | ORAL | Status: DC
  Administered 2011-06-03: 40 mg via ORAL
  Filled 2011-06-03: qty 1

## 2011-06-03 MED ORDER — WARFARIN - PHARMACIST DOSING INPATIENT
Freq: Every day | Status: DC
  Administered 2011-06-03: 19:00:00

## 2011-06-03 NOTE — Progress Notes (Signed)
ANTICOAGULATION CONSULT NOTE - Follow Up Consult  Pharmacy Consult for Lovenox and Coumadin Indication: Factor V Deficiency  Allergies  Allergen Reactions  . Celecoxib Rash    CELEBREX    Patient Measurements: Height: 5\' 10"  (177.8 cm) Weight: 156 lb 15.5 oz (71.2 kg) IBW/kg (Calculated) : 73    Vital Signs: Temp: 97.5 F (36.4 C) (05/19 0425) Temp src: Oral (05/19 0425) BP: 156/84 mmHg (05/19 0425) Pulse Rate: 60  (05/19 0425)  Labs:  [facility] 06/03/11 0634 06/01/11 1536  HGB 14.0 14.4  HCT 44.0 44.5  PLT 231 245  APTT -- --  LABPROT 19.5* 22.0*  INR 1.62* 1.89*  HEPARINUNFRC -- --  CREATININE 1.44* 1.57*  CKTOTAL -- --  CKMB -- --  TROPONINI -- --    Estimated Creatinine Clearance: 37.1 ml/min (by C-G formula based on Cr of 1.44).   Medications:  Scheduled:     . enoxaparin (LOVENOX) injection  1 mg/kg Subcutaneous Q12H  . pantoprazole (PROTONIX) IV  40 mg Intravenous QHS  . sodium chloride      . sodium chloride      . sodium chloride        Assessment: Patient presently on full dose Lovenox Patient on Coumadin 8.5 mg daily at home for Factor V Deficiency Renal function CrCl > 30  Goal of Therapy:  Restart Coumadin with goal INR 2 to 3 Full dose anticoagulation with Lovenox until therapeutic INR Monitor platelets by anticoagulation protocol: Yes   Plan:  1) Coumadin 10 mg po today 2) Continue Lovenox 1 mg/kg every 12 hours until therapeutic INR 3) CBC, INR\PT daily 4) Monitor renal function  Raquel James, Velna Hedgecock Bennett 06/03/2011,10:14 AM

## 2011-06-03 NOTE — Progress Notes (Signed)
Subjective: NG tube fell out last night. He continues to have flatus and bowel movements. No nausea or vomiting.  Objective: Vital signs in last 24 hours: Temp:  [97.5 F (36.4 C)-98.1 F (36.7 C)] 97.5 F (36.4 C) (05/19 0425) Pulse Rate:  [60-65] 60  (05/19 0425) Resp:  [18-20] 18  (05/19 0425) BP: (156-172)/(77-84) 156/84 mmHg (05/19 0425) SpO2:  [94 %-98 %] 97 % (05/19 0425) Last BM Date: 06/01/11  Intake/Output from previous day: 05/18 0701 - 05/19 0700 In: 0  Out: 1650 [Urine:1650] Intake/Output this shift:    General appearance: alert, cooperative and no distress Resp: clear to auscultation bilaterally Cardio: regular rate and rhythm, S1, S2 normal, no murmur, click, rub or gallop GI: soft, non-tender; bowel sounds normal; no masses,  no organomegaly  Lab Results:   [facility] 06/03/11 0634 06/01/11 1536  WBC 6.4 8.1  HGB 14.0 14.4  HCT 44.0 44.5  PLT 231 245   BMET  Basename 06/03/11 0634 06/01/11 1536  NA 136 140  K 3.8 3.8  CL 103 105  CO2 26 24  GLUCOSE 101* 133*  BUN 12 20  CREATININE 1.44* 1.57*  CALCIUM 8.8 9.4   PT/INR  Basename 06/03/11 0634 06/01/11 1536  LABPROT 19.5* 22.0*  INR 1.62* 1.89*    Studies/Results: Ct Abdomen Pelvis W Contrast  06/01/2011  *RADIOLOGY REPORT*  Clinical Data: Abdominal pain and blood in stools.  Vomiting. History of multiple small bowel obstructions.  CT ABDOMEN AND PELVIS WITH CONTRAST  Technique:  Multidetector CT imaging of the abdomen and pelvis was performed following the standard protocol during bolus administration of intravenous contrast.  Contrast: 80mL OMNIPAQUE IOHEXOL 300 MG/ML  SOLN  Comparison: CT abdomen pelvis 10/20 09/2008  Findings: Cardiac pacer leads are present in the right atrium and right ventricle.  There is coronary artery atherosclerotic calcification.  The left ventricle and left atrium are dilated.  There is atelectasis in the dependent portion of both lung bases. Interstitial prominence  in the lower lobes bilaterally cannot be excluded.  Mild intrahepatic biliary ductal dilatation is unchanged. Extrahepatic bile duct dilatation measures up to 1.4 cm at the level the pancreatic head, unchanged.  Three stable circumscribed low density lesions in the liver, the largest measuring 1.8 cm in the right hepatic lobe, are stable, and likely cysts.  No new or suspicious hepatic lesion.  The spleen, adrenal glands, and pancreas are stable and within normal limits.  Bilateral renal atrophy is unchanged.  There is no hydronephrosis or renal mass.  There is a 3 mm nonobstructing stone in the lower pole of the right kidney.  4 mm stone in the left renal pelvis, with no evidence of hydronephrosis.  Both ureters are normal in caliber.  Chronic urinary bladder stone measures 13 mm, slightly increased from prior study (measured 10 mm in 2010).  Urinary bladder wall thickness is normal.  Prostamegaly is without significant change.  Stomach is distended with oral contrast and air.  There are multiple dilated loops of small bowel, which can also be visualized on the scout view of the CT.  There is a relative transition to normal caliber small bowel loops in the right lower quadrant.  A discrete focal transition point is not detected.  No mass lesion is seen. There is a small amount of fluid in the mesentery of the right lower quadrant on image number 69. No small bowel wall thickening is identified.  There are postsurgical changes in the abdomen, with surgical clips and  suture associated with small bowel in the left mid abdomen. Liquid stool is noted in the sigmoid colon and rectum. Diverticulosis of the colon, most prominent in the descending and sigmoid region.  There is no lymphadenopathy or free intraperitoneal air.  Inferior vena cava filter is positioned just below the renal arteries, and is stable.  There is atherosclerotic calcification of the aorta and iliac vasculature.  There is ectasia of the right common  iliac arteries, measuring up to 1.7 cm AP diameter.  There are postoperative changes of anterior abdominal wall hernia repair.  There are no herniated bowel loops.  Left inguinal hernia contains fat only.  There are degenerative changes of the thoracolumbar spine. No acute or suspicious bony abnormality.  IMPRESSION:  1.  Small bowel obstruction.  A relative transition point to normal caliber bowel loops in the right lower quadrant. Obstruction is favored to be due to adhesions. 2.  Small amount of mesenteric fluid in the right lower quadrant, likely secondary to the obstruction. 3.  Single chronic urinary bladder stone is slightly increased in size since prior study of 2010. 4.  Nonobstructing stones in the lower pole the right kidney and the renal pelvis of the left kidney, with stable moderate renal atrophy bilaterally. 5.  Stable chronic intra and extrahepatic biliary ductal dilatation and stable hepatic cysts. 6.  Cardiomegaly with coronary artery atherosclerotic calcification. 7.  Bibasilar atelectasis.  Cannot exclude an element of interstitial lung disease at the bases.  Original Report Authenticated By: Britta Mccreedy, M.D.    Anti-infectives: Anti-infectives    None      Assessment/Plan: Impression: Small bowel obstruction resolving. We'll start full liquid diet and advance as tolerated. Have consulted pharmacy to restart Coumadin.  LOS: 2 days    Shamina Etheridge A 06/03/2011

## 2011-06-03 NOTE — Progress Notes (Signed)
The patient is receiving Protonix by the intravenous route.  Based on criteria approved by the Pharmacy and Therapeutics Committee and the Medical Executive Committee, the medication is being converted to the equivalent oral dose form.  These criteria include: -No Active GI bleeding -Able to tolerate diet of full liquids (or better) or tube feeding -Able to tolerate other medications by the oral or enteral route  If you have any questions about this conversion, please contact the Pharmacy Department (ext [phone]).  Thank you.  Justin Lang, [facility] 06/03/2011 10:47 AM

## 2011-06-04 LAB — BASIC METABOLIC PANEL
BUN: 10 mg/dL (ref 6–23)
Calcium: 8.9 mg/dL (ref 8.4–10.5)
Chloride: 105 mEq/L (ref 96–112)
Creatinine, Ser: 1.43 mg/dL — ABNORMAL HIGH (ref 0.50–1.35)
GFR calc Af Amer: 50 mL/min — ABNORMAL LOW (ref >90)

## 2011-06-04 LAB — PROTIME-INR
INR: 1.5 — ABNORMAL HIGH (ref 0.00–1.49)
Prothrombin Time: 18.4 seconds — ABNORMAL HIGH (ref 11.6–15.2)

## 2011-06-04 LAB — CBC
Hemoglobin: 13.8 g/dL (ref 13.0–17.0)
MCH: 28.9 pg (ref 26.0–34.0)
MCHC: 32.6 g/dL (ref 30.0–36.0)
Platelets: 218 10*3/uL (ref 150–400)
RDW: 14.5 % (ref 11.5–15.5)

## 2011-06-04 MED ORDER — ENOXAPARIN SODIUM 150 MG/ML ~~LOC~~ SOLN: 1.5000 mg/kg | Freq: Two times a day (BID) | SUBCUTANEOUS | Status: DC

## 2011-06-04 MED ORDER — WARFARIN SODIUM 10 MG PO TABS: 10.0000 mg | ORAL_TABLET | Freq: Once | ORAL | Status: DC

## 2011-06-04 NOTE — Progress Notes (Signed)
ANTICOAGULATION CONSULT NOTE - Follow Up Consult  Pharmacy Consult for Lovenox and Coumadin Indication: Factor V Deficiency  Allergies  Allergen Reactions  . Celecoxib Rash    CELEBREX    Patient Measurements: Height: 5\' 10"  (177.8 cm) Weight: 156 lb 15.5 oz (71.2 kg) IBW/kg (Calculated) : 73    Vital Signs: Temp: 97.3 F (36.3 C) (05/20 0429) Temp src: Oral (05/20 0429) BP: 158/89 mmHg (05/20 0429) Pulse Rate: 62  (05/20 0429)  Labs:  Basename 06/04/11 0537 06/03/11 0634 06/01/11 1536  HGB 13.8 14.0 --  HCT 42.3 44.0 44.5  PLT 218 231 245  APTT -- -- --  LABPROT 18.4* 19.5* 22.0*  INR 1.50* 1.62* 1.89*  HEPARINUNFRC -- -- --  CREATININE 1.43* 1.44* 1.57*  CKTOTAL -- -- --  CKMB -- -- --  TROPONINI -- -- --    Estimated Creatinine Clearance: 37.3 ml/min (by C-G formula based on Cr of 1.43).   Medications:  Scheduled:     . enoxaparin (LOVENOX) injection  1 mg/kg Subcutaneous Q12H  . pantoprazole  40 mg Oral QHS  . sodium chloride      . sodium chloride      . warfarin  10 mg Oral ONCE-1800  . Warfarin - Pharmacist Dosing Inpatient   Does not apply q1800  . DISCONTD: pantoprazole (PROTONIX) IV  40 mg Intravenous QHS    Assessment: Patient on Coumadin 8.5 mg daily at home for Factor V Deficiency. Coumadin was held for SBO and restarted 5/19. Currently bridging with Lovenox.  Renal function improving. No bleeding noted.   Goal of Therapy:  Restart Coumadin with goal INR 2 to 3 Full dose anticoagulation with Lovenox until therapeutic INR Monitor platelets by anticoagulation protocol: Yes   Plan:  1) Coumadin 10 mg po today 2) Continue Lovenox 1 mg/kg every 12 hours until therapeutic INR 3) CBC, INR daily 4) Monitor renal function  Justin Lang, [facility], PHARMD 06/04/2011,7:54 AM

## 2011-06-04 NOTE — Progress Notes (Signed)
UR Chart Review Completed  

## 2011-06-04 NOTE — Discharge Summary (Signed)
Physician Discharge Summary  Patient ID: LESHON ARMISTEAD MRN: [id number] DOB/AGE: 07/25/1922 78 y.o.  Admit date: 06/01/2011 Discharge date: 06/04/2011  Admission Diagnoses: Partial small bowel obstruction, factor V Leiden deficiency, history of metastatic colon carcinoma, coronary artery disease, status post pacemaker placement  Discharge Diagnoses: Same, partial small bowel obstruction resolved Active Problems:  * No active hospital problems. *    Discharged Condition: good  Hospital Course: Patient is an 78 year old white male with multiple medical problems including history of metastatic colon carcinoma as well as factor V Leiden deficiency who presented with nausea and vomiting. CT scan the abdomen pelvis revealed a partial small bowel obstruction. He is had multiple episodes of this in the past and is felt to be secondary to adhesive disease due to his multiple abdominal surgeries. He was admitted to the hospital for bowel decompression. He was also switched to Lovenox as he is on chronic anticoagulation. His bowel function returned within 36 hours after his admission, and his nasogastric tube was removed. His diet was advanced at difficulty. He is being discharged home in good improving condition.  Consults: Pharmacy  Discharge Exam: Blood pressure 158/89, pulse 62, temperature 97.3 F (36.3 C), temperature source Oral, resp. rate 18, height 5\' 10"  (1.778 m), weight 71.2 kg (156 lb 15.5 oz), SpO2 98.00%. General appearance: alert, cooperative and no distress Resp: clear to auscultation bilaterally Cardio: regular rate and rhythm, S1, S2 normal, no murmur, click, rub or gallop GI: soft, non-tender; bowel sounds normal; no masses,  no organomegaly  Disposition: 06-Home-Health Care Svc  Discharge Orders    Future Appointments: Provider: Department: Dept Phone: Center:   06/06/2011 8:40 AM [facility] [phone] None   06/12/2011 10:10 AM [facility] [phone] None   06/12/2011 4:00 PM Marinus Maw, MD [facility] [phone] [facility]   11/06/2011 10:00 AM Randall An, MD [facility] [phone] None     Medication List  As of 06/04/2011  9:26 AM   TAKE these medications         enoxaparin 150 MG/ML injection   Commonly known as: LOVENOX   Inject 0.71 mLs (105 mg total) into the skin every 12 (twelve) hours.      fluticasone 0.05 % cream   Commonly known as: CUTIVATE   Apply 1 application topically daily.      PEPCID PO   Take 1 tablet by mouth once as needed. FOR UPSET STOMACH      warfarin 7.5 MG tablet   Commonly known as: COUMADIN   Take 7.5 mg by mouth See admin instructions. Take 7.5mg  (1 tablet ) along with 1 mg tablet for a total of 8.5 mg daily      warfarin 1 MG tablet   Commonly known as: COUMADIN   Take 1 mg by mouth See admin instructions. TAKES DAILY WITH 7.5MG  TABLET TO MAKE TOTAL OF 8.5MG  DOSAGE           Follow-up Information    Follow up with Randall An, MD on 06/06/2011. (INR check at 8:30am)    Contact information:   [address] [place] [address] [phone]          Signed: Dalia Heading 06/04/2011, 9:26 AM

## 2011-06-04 NOTE — Progress Notes (Signed)
Pt discharged home today per Dr. Lovell Sheehan. Pt's IV site D/C'd and WNL. Pt's VS stable and WNL at this time. Pt provided with home medication list and discharge instructions. Pt verbalized understanding. Pt left floor via WC in stable condition accompanied by NT.

## 2011-06-04 NOTE — Care Management Note (Signed)
    Page 1 of 1   06/04/2011     11:04:32 AM   CARE MANAGEMENT NOTE 06/04/2011  Patient:  Justin Lang, Justin Lang   Account Number:  [id number]  Date Initiated:  06/04/2011  Documentation initiated by:  Sharrie Rothman  Subjective/Objective Assessment:   Pt admitted from home with small bowel obs. Pt lives with wife and is independent with ADL's PTA. Pt on lovenox and coumadin PTA and will be discharged with both. Pts wife administers the lovenox and PT/INR is followed up by Dr. Mariel Sleet.     Action/Plan:   No CM needs noted.   Anticipated DC Date:  06/04/2011   Anticipated DC Plan:  HOME/SELF CARE      DC Planning Services  CM consult      Choice offered to / List presented to:             Status of service:  Completed, signed off Medicare Important Message given?   (If response is "NO", the following Medicare IM given date fields will be blank) Date Medicare IM given:   Date Additional Medicare IM given:    Discharge Disposition:  HOME/SELF CARE  Per UR Regulation:    If discussed at Long Length of Stay Meetings, dates discussed:    Comments:  06/04/11 1102 Arlyss Queen, RN BSN CM Next PT/INR check is Wednesday 5/22 at 8:30.

## 2011-06-04 NOTE — Discharge Instructions (Signed)
Intestinal Obstruction An intestinal obstruction comes from a physical blockage in the bowel. Different problems in the bowel may cause these blockages.  CAUSES   Adhesions from previous surgeries   Cancer or tumor   A hernia, which is a condition in which a portion of the bowel bulges out through an opening or weakness in the abdomen (belly). This sometimes squeezes the bowel.   A swallowed object.   Blockage (impaction) with worms is common in third world countries.   A twisting of the bowel or telescoping of a portion of the bowel into another portion (intussusceptions)   Anything that stops food from going through from the stomach to the anus.  SYMPTOMS  Symptoms of bowel obstruction may result in your belly getting bigger (bloating), nausea, vomiting, explosive diarrhea, explosive stool. You may not be able to hear your normal bowel sounds (such as "growling in your stomach"). You may also stop having bowel movements or passing gas. DIAGNOSIS  Usually this condition is diagnosed with a history and an examination. Often, lab studies (blood work) and x-rays may be used to find the cause. TREATMENT  The main treatment of this condition is to rest the intestine (gut). Often times the obstruction may relieve itself and allow the gut to start working again. Think of the gut like a balloon that is blown up (filled with trapped food and water) which has squeezed into a hole or area which it cannot get through.   If the obstruction is complete, an NG tube (nasogastric) is passed through the nose and into the stomach. It is then connected to suction to keep the stomach emptied out. This also helps treat the nausea and vomiting.   If there is an imbalance in the electrolytes, they are corrected with intravenous fluids. These have the proper chemicals in them to correct the problem.   If the reason for the obstruction (blockage) does not get better with conservative (non-surgical) treatment,  surgery may be necessary. Sometimes, surgery is done immediately if your surgeon knows that the problem is not going to get better with conservative treatment.  PROGNOSIS  Depending on what the problem is, most of these problems can be treated by your caregivers with good results. Your surgeon will discuss the best course of action to take, with you or your loved ones. FOLLOWING SURGERY Seek immediate medical attention if you have:  Increasing abdominal pain, repeated vomiting, dehydration or fainting.   Severe weakness, chest pain or back pain.   Blood in your vomit, stool or you have tarry stool.  Document Released: 03/24/2003 Document Revised: 12/21/2010 Document Reviewed: 08/22/2007 [employer] Patient Information 2012 [employer]. 

## 2011-06-06 ENCOUNTER — Encounter (HOSPITAL_BASED_OUTPATIENT_CLINIC_OR_DEPARTMENT_OTHER): Payer: Medicare Other

## 2011-06-06 ENCOUNTER — Other Ambulatory Visit (HOSPITAL_COMMUNITY): Payer: Self-pay | Admitting: Oncology

## 2011-06-06 DIAGNOSIS — D682 Hereditary deficiency of other clotting factors: Secondary | ICD-10-CM

## 2011-06-06 LAB — PROTIME-INR
INR: 2.01 — ABNORMAL HIGH (ref 0.00–1.49)
Prothrombin Time: 23.1 seconds — ABNORMAL HIGH (ref 11.6–15.2)

## 2011-06-06 NOTE — Progress Notes (Signed)
Labs drawn [phone] 8.5mg  of coud and lovenox 0.57ml twice a day. Call patient at [phone]

## 2011-06-10 ENCOUNTER — Encounter (HOSPITAL_COMMUNITY): Payer: Self-pay | Admitting: *Deleted

## 2011-06-10 ENCOUNTER — Emergency Department (HOSPITAL_COMMUNITY)
Admission: EM | Admit: 2011-06-10 | Discharge: 2011-06-10 | Disposition: A | Discharge status: Home / Self Care | Payer: Medicare Other | Attending: Emergency Medicine | Admitting: Emergency Medicine

## 2011-06-10 ENCOUNTER — Emergency Department (HOSPITAL_COMMUNITY): Payer: Medicare Other

## 2011-06-10 DIAGNOSIS — R109 Unspecified abdominal pain: Secondary | ICD-10-CM | POA: Insufficient documentation

## 2011-06-10 DIAGNOSIS — Z85038 Personal history of other malignant neoplasm of large intestine: Secondary | ICD-10-CM | POA: Insufficient documentation

## 2011-06-10 DIAGNOSIS — N200 Calculus of kidney: Secondary | ICD-10-CM | POA: Insufficient documentation

## 2011-06-10 DIAGNOSIS — Z86718 Personal history of other venous thrombosis and embolism: Secondary | ICD-10-CM | POA: Insufficient documentation

## 2011-06-10 DIAGNOSIS — Z7901 Long term (current) use of anticoagulants: Secondary | ICD-10-CM | POA: Insufficient documentation

## 2011-06-10 DIAGNOSIS — K5641 Fecal impaction: Secondary | ICD-10-CM | POA: Insufficient documentation

## 2011-06-10 DIAGNOSIS — Z8739 Personal history of other diseases of the musculoskeletal system and connective tissue: Secondary | ICD-10-CM | POA: Insufficient documentation

## 2011-06-10 LAB — COMPREHENSIVE METABOLIC PANEL
ALT: 8 U/L (ref 0–53)
Albumin: 3.3 g/dL — ABNORMAL LOW (ref 3.5–5.2)
Alkaline Phosphatase: 59 U/L (ref 39–117)
BUN: 20 mg/dL (ref 6–23)
Chloride: 102 mEq/L (ref 96–112)
GFR calc Af Amer: 45 mL/min — ABNORMAL LOW (ref >90)
Glucose, Bld: 122 mg/dL — ABNORMAL HIGH (ref 70–99)
Potassium: 3.7 mEq/L (ref 3.5–5.1)
Sodium: 139 mEq/L (ref 135–145)
Total Bilirubin: 0.4 mg/dL (ref 0.3–1.2)

## 2011-06-10 LAB — CBC
Hemoglobin: 14.4 g/dL (ref 13.0–17.0)
Platelets: 235 10*3/uL (ref 150–400)
RBC: 4.98 MIL/uL (ref 4.22–5.81)
WBC: 7.6 10*3/uL (ref 4.0–10.5)

## 2011-06-10 LAB — DIFFERENTIAL
Lymphocytes Relative: 17 % (ref 12–46)
Lymphs Abs: 1.3 10*3/uL (ref 0.7–4.0)
Monocytes Relative: 9 % (ref 3–12)
Neutro Abs: 5.1 10*3/uL (ref 1.7–7.7)
Neutrophils Relative %: 67 % (ref 43–77)

## 2011-06-10 LAB — LIPASE, BLOOD: Lipase: 30 U/L (ref 11–59)

## 2011-06-10 MED ORDER — SODIUM CHLORIDE 0.9 % IV SOLN
INTRAVENOUS | Status: DC
  Administered 2011-06-10: 17:00:00 via INTRAVENOUS

## 2011-06-10 MED ORDER — SODIUM CHLORIDE 0.9 % IV BOLUS (SEPSIS)
500.0000 mL | Freq: Once | INTRAVENOUS | Status: AC
  Administered 2011-06-10: 500 mL via INTRAVENOUS

## 2011-06-10 MED ORDER — POLYETHYLENE GLYCOL 3350 17 G PO PACK: 17.0000 g | PACK | Freq: Every day | ORAL | Status: AC

## 2011-06-10 MED ORDER — FLEET ENEMA 7-19 GM/118ML RE ENEM
1.0000 | ENEMA | Freq: Once | RECTAL | Status: AC
  Administered 2011-06-10: 1 via RECTAL

## 2011-06-10 NOTE — ED Provider Notes (Signed)
History  This chart was scribed for Justin Horn, MD by Cherlynn Perches. The patient was seen in room APA15/APA15. Patient's care was started at 1539   CSN: [id number]  Arrival date & time 06/10/11  1539   First MD Initiated Contact with Patient 06/10/11 1619      Chief Complaint  Patient presents with  . Fecal Impaction    (Consider location/radiation/quality/duration/timing/severity/associated sxs/prior treatment) HPI  Justin Lang is a 78 y.o. male with a h/o colon cancer, bowel obstructions, and DVT who is on coumadin and presents to the Emergency Department complaining of 1 week of gradual onset, gradually worsening, constant, severe, diffuse abdominal pain with associated abdominal distention, constipation, and decreased appetite. Pt was admitted to the hospital about 2 weeks ago for a small bowel obstruction and was discharged 1 week ago. No surgery was performed. Pt states that he did not have abdominal pain upon leaving the hospital, but it gradually began after being discharged. Pt denies any modifying factors. Pt denies fever, confusion, SOB, and vomiting. Pt's had a small bowel movement about 24 hours ago.   Past Medical History  Diagnosis Date  . Colon cancer     hx of metastic- treated with surgery and readiation  . DVT (deep venous thrombosis)     hx of s/p Greenfield filter  . OAB (overactive bladder)   . Arthritis   . Cataracts, bilateral   . FH: factor V Leiden deficiency   . History of nephrolithiasis   . History of ankle fracture   . 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  . Peripheral neuropathy   . Vitamin B12 deficiency   . Symptomatic bradycardia   . Pacemaker     Past Surgical History  Procedure Date  . Colectomy     sigmoid secondary to colon ca- 1997  . Exploratory laparotomy     metastatic retroperitoneal lymph node- 2002  . Total knee arthroplasty     right-1991; left-1992  . Back surgeries     x3  . Filter replacement    . Left ankle surgery   . Incisional herniorraphy   . Pacemaker insertion   . Replacement total knee bilateral   . Colon resection   . Colonoscopy 08/18/2010    Procedure: COLONOSCOPY;  Surgeon: Malissa Hippo, MD;  Location: [facility];  Service: Endoscopy;  Laterality: N/A;  . Esophagogastroduodenoscopy 08/18/2010    Procedure: ESOPHAGOGASTRODUODENOSCOPY (EGD);  Surgeon: Malissa Hippo, MD;  Location: [facility];  Service: Endoscopy;  Laterality: N/A;    Family History  Problem Relation Age of Onset  . Anesthesia problems Neg Hx   . Hypotension Neg Hx   . Malignant hyperthermia Neg Hx   . Pseudochol deficiency Neg Hx   . Cancer Sister     History  Substance Use Topics  . Smoking status: Never Smoker   . Smokeless tobacco: Never Used  . Alcohol Use: No      Review of Systems  Constitutional: Positive for appetite change. Negative for fever.       10 Systems reviewed and are negative for acute change except as noted in the HPI.  HENT: Negative for congestion.   Eyes: Negative for discharge and redness.  Respiratory: Negative for cough and shortness of breath.   Cardiovascular: Negative for chest pain.  Gastrointestinal: Positive for abdominal pain, constipation and abdominal distention. Negative for nausea and vomiting.  Musculoskeletal: Negative for back pain.  Skin: Negative for rash.  Neurological: Negative for syncope, numbness and headaches.  Psychiatric/Behavioral:       No behavior change.    Allergies  Celecoxib  Home Medications   Current Outpatient Rx  Name Route Sig Dispense Refill  . FLUTICASONE PROPIONATE 0.05 % EX CREA Topical Apply 1 application topically daily.    . WARFARIN SODIUM 1 MG PO TABS Oral Take 1 mg by mouth See admin instructions. TAKES DAILY WITH 7.5MG  TABLET TO MAKE TOTAL OF 8.5MG  DOSAGE    . WARFARIN SODIUM 7.5 MG PO TABS Oral Take 7.5 mg by mouth See admin instructions. Take 7.5mg  (1 tablet ) along with 1 mg tablet for a total  of 8.5 mg daily    . POLYETHYLENE GLYCOL 3350 PO PACK Oral Take 17 g by mouth daily. 14 each 0    BP 175/64  Pulse 62  Temp(Src) 97.4 F (36.3 C) (Oral)  Resp 18  SpO2 97%  Physical Exam  Nursing note and vitals reviewed. Constitutional:       Awake, alert, nontoxic appearance.  HENT:  Head: Atraumatic.  Mouth/Throat: Oropharynx is clear and moist.  Eyes: Right eye exhibits no discharge. Left eye exhibits no discharge.  Neck: Neck supple.  Cardiovascular: Normal rate, regular rhythm and normal heart sounds.   No murmur heard. Pulmonary/Chest: Effort normal and breath sounds normal. No respiratory distress. He has no wheezes. He has no rales. He exhibits no tenderness.  Abdominal: Soft. Bowel sounds are normal. There is tenderness (mild diffuse tenderness). There is no rebound.  Genitourinary:       Testicles non-tender, no palpable inguinal hernia, no palpable fecal impaction, minimal soft brown stool  Musculoskeletal: He exhibits no tenderness.       Baseline ROM, no obvious new focal weakness.  Neurological:       Mental status and motor strength appears baseline for patient and situation.  Skin: Skin is warm. No rash noted.  Psychiatric: He has a normal mood and affect.    ED Course  Procedures (including critical care time)  DIAGNOSTIC STUDIES: Oxygen Saturation is 95% on room air, adequate by my interpretation.    COORDINATION OF CARE: 4:33PM - will get x-ray of abdomen and labs. Pt agrees with plan.  7:11PM - Dr to pt bedside for re-eval. Pt has had 3 BM in ED and no abdominal pain or tenderness. Discussed discharge and pt agrees.   Labs Reviewed  DIFFERENTIAL - Abnormal; Notable for the following:    Eosinophils Relative 6 (*)    All other components within normal limits  COMPREHENSIVE METABOLIC PANEL - Abnormal; Notable for the following:    Glucose, Bld 122 (*)    Creatinine, Ser 1.54 (*)    Albumin 3.3 (*)    GFR calc non Af Amer 39 (*)    GFR calc Af  Amer 45 (*)    All other components within normal limits  CBC  LIPASE, BLOOD  LAB REPORT - SCANNED   Dg Abd Acute W/chest  06/10/2011  *RADIOLOGY REPORT*  Clinical Data: Abdominal pain and constipation.  ACUTE ABDOMEN SERIES (ABDOMEN 2 VIEW & CHEST 1 VIEW)  Comparison: 06/01/2011  Findings: Dual lead pacer noted. Low lung volumes are present, causing crowding of the pulmonary vasculature.  Heart size is at the upper normal range.  Suspected callus formation along the left lower rib fractures.  No free intraperitoneal gas is visualized beneath the hemidiaphragms. Linear subsegmental atelectasis noted at the right lung base.  IVC filter noted.  Cholecystectomy clips  noted with suspected right renal calculus.  Bowel surgery clips noted in the left abdomen. Small left renal calculus noted.  Motion artifact obscures the upright view, but there are scattered air-fluid levels in small bowel and possibly large bowel.  Mildly dilated small bowel noted.  A stellate density projecting over the pelvis is compatible with the patient's known bladder calculus, and measures up to 1.8 cm in diameter.  Lumbar spondylosis noted.  IMPRESSION:  1.  Abnormal mildly dilated bowel with scattered air-fluid levels and an abnormal but nonspecific appearance which could represent diffuse ileus or obstruction. 2.  Mild right basilar subsegmental atelectasis. 3.  Suspected callus formation along the left lower rib fractures. 4.  Bilateral renal calculi.  Bladder calculus is again noted. 5.  Low lung volumes  Original Report Authenticated By: Dellia Cloud, M.D.     1. Abdominal pain       MDM  I personally performed the services described in this documentation, which was scribed in my presence. The recorded information has been reviewed and considered. Pt stable in ED with no significant deterioration in condition.Patient / Family / Caregiver informed of clinical course, understand medical decision-making process, and  agree with plan.I doubt any other EMC precluding discharge at this time including, but not necessarily limited to the following:SBO.   Justin Horn, MD 06/11/11 [phone]

## 2011-06-10 NOTE — ED Notes (Signed)
Pt up to bedside commode.  Pt had small soft BM.  nad noted.

## 2011-06-10 NOTE — Discharge Instructions (Signed)

## 2011-06-10 NOTE — ED Notes (Signed)
Abdominal pain,

## 2011-06-12 ENCOUNTER — Encounter: Payer: Self-pay | Admitting: Internal Medicine

## 2011-06-12 ENCOUNTER — Ambulatory Visit (INDEPENDENT_AMBULATORY_CARE_PROVIDER_SITE_OTHER): Payer: Medicare Other | Admitting: Internal Medicine

## 2011-06-12 ENCOUNTER — Other Ambulatory Visit (HOSPITAL_COMMUNITY): Payer: Medicare Other

## 2011-06-12 VITALS — BP 122/76 | HR 68 | Resp 16 | Ht 69.0 in | Wt 157.0 lb

## 2011-06-12 DIAGNOSIS — R030 Elevated blood-pressure reading, without diagnosis of hypertension: Secondary | ICD-10-CM

## 2011-06-12 DIAGNOSIS — Z950 Presence of cardiac pacemaker: Secondary | ICD-10-CM

## 2011-06-12 DIAGNOSIS — I495 Sick sinus syndrome: Secondary | ICD-10-CM | POA: Insufficient documentation

## 2011-06-12 LAB — PACEMAKER DEVICE OBSERVATION
AL IMPEDENCE PM: 424 Ohm
BAMS-0001: 170 {beats}/min
BATTERY VOLTAGE: 3 V
RV LEAD AMPLITUDE: 6.976 mv
VENTRICULAR PACING PM: 1.48

## 2011-06-12 NOTE — Patient Instructions (Signed)
Your physician recommends that you schedule a follow-up appointment in:  1 - Dr Ladona Ridgel in 1 year 2 - [facility] transmission September 5th

## 2011-06-13 ENCOUNTER — Encounter (HOSPITAL_BASED_OUTPATIENT_CLINIC_OR_DEPARTMENT_OTHER): Payer: Medicare Other

## 2011-06-13 ENCOUNTER — Telehealth (HOSPITAL_COMMUNITY): Payer: Self-pay

## 2011-06-13 ENCOUNTER — Encounter: Payer: Self-pay | Admitting: Internal Medicine

## 2011-06-13 DIAGNOSIS — D682 Hereditary deficiency of other clotting factors: Secondary | ICD-10-CM

## 2011-06-13 LAB — PROTIME-INR: Prothrombin Time: 22.5 seconds — ABNORMAL HIGH (ref 11.6–15.2)

## 2011-06-13 NOTE — Progress Notes (Signed)
HPI [name] returns for followup. He has a h/o CHB, s/p PPM, HTN, and CA. He denies chest pain or sob. He denies syncope. No edema. 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 He has lost about 5 lbs in the last year. No palpitations. Allergies  Allergen Reactions  . Celecoxib Rash    CELEBREX     Current Outpatient Prescriptions  Medication Sig Dispense Refill  . fluticasone (CUTIVATE) 0.05 % cream Apply 1 application topically daily.      . polyethylene glycol (MIRALAX / GLYCOLAX) packet Take 17 g by mouth daily.  14 each  0  . warfarin (COUMADIN) 1 MG tablet Take 1 mg by mouth See admin instructions. TAKES DAILY WITH 7.5MG  TABLET TO MAKE TOTAL OF 8.5MG  DOSAGE      . warfarin (COUMADIN) 7.5 MG tablet Take 7.5 mg by mouth See admin instructions. Take 7.5mg  (1 tablet ) along with 1 mg tablet for a total of 8.5 mg daily         Past Medical History  Diagnosis Date  . Colon cancer     hx of metastic- treated with surgery and readiation  . DVT (deep venous thrombosis)     hx of s/p Greenfield filter  . OAB (overactive bladder)   . Arthritis   . Cataracts, bilateral   . FH: factor V Leiden deficiency   . History of nephrolithiasis   . History of ankle fracture   . 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  . Peripheral neuropathy   . Vitamin B12 deficiency   . Symptomatic bradycardia   . Pacemaker   . Sinoatrial node dysfunction   . Sinoatrial node dysfunction     ROS:   All systems reviewed and negative except as noted in the HPI.   Past Surgical History  Procedure Date  . Colectomy     sigmoid secondary to colon ca- 1997  . Exploratory laparotomy     metastatic retroperitoneal lymph node- 2002  . Total knee arthroplasty     right-1991; left-1992  . Back surgeries     x3  . Filter replacement   . Left ankle surgery   . Incisional herniorraphy   . Pacemaker insertion   . Replacement total knee bilateral   . Colon resection   . Colonoscopy  08/18/2010    Procedure: COLONOSCOPY;  Surgeon: Malissa Hippo, MD;  Location: [facility];  Service: Endoscopy;  Laterality: N/A;  . Esophagogastroduodenoscopy 08/18/2010    Procedure: ESOPHAGOGASTRODUODENOSCOPY (EGD);  Surgeon: Malissa Hippo, MD;  Location: [facility];  Service: Endoscopy;  Laterality: N/A;     Family History  Problem Relation Age of Onset  . Anesthesia problems Neg Hx   . Hypotension Neg Hx   . Malignant hyperthermia Neg Hx   . Pseudochol deficiency Neg Hx   . Cancer Sister      History   Social History  . Marital Status: Married    Spouse Name: N/A    Number of Children: N/A  . Years of Education: N/A   Occupational History  . Not on file.   Social History Main Topics  . Smoking status: Never Smoker   . Smokeless tobacco: Never Used  . Alcohol Use: No  . Drug Use: No  . Sexually Active: No   Other Topics Concern  . Not on file   Social History Narrative   Married, lives with spouse.      BP 122/76  Pulse  68  Resp 16  Ht 5\' 9"  (1.753 m)  Wt 157 lb (71.215 kg)  BMI 23.18 kg/m2  Physical Exam:  Chronically ill appearing elderly man, NAD HEENT: Unremarkable Neck:  No JVD, no thyromegally Lungs:  Clear with wheezes or rhonchi. Minimal basilar rales. HEART:  Regular rate rhythm, no murmurs, no rubs, no clicks Abd:  soft, positive bowel sounds, no organomegally, no rebound, no guarding Ext:  2 plus pulses, no edema, no cyanosis, no clubbing Skin:  No rashes no nodules Neuro:  CN II through XII intact, motor grossly intact  DEVICE  Normal device function.  See PaceArt for details.   Assess/Plan:

## 2011-06-13 NOTE — Assessment & Plan Note (Signed)
His device is working normally. Will recheck in several months. 

## 2011-06-13 NOTE — Progress Notes (Signed)
Labs drawn today for pt. Patient on 8.5mg of coud.  Call patient at [phone] 

## 2011-06-13 NOTE — Telephone Encounter (Signed)
Spoke with wife and  instructed to change coumadin to 9.5 mg x 2 days then starts 9.5 mg, 8.5 mg, 8.5 mg/ 9.5 mg, 8.5 mg, 8.5 / 9.5 mg, 8.5 mg , 8.5 mg etc. and to return for next PT/INR on 6/11 @ 840am.  Verbalizes understanding and read back instructions.

## 2011-06-13 NOTE — Progress Notes (Signed)
Addended by: Sterling Big on: 06/13/2011 05:04 PM   Modules accepted: Orders

## 2011-06-13 NOTE — Assessment & Plan Note (Signed)
His blood pressure is well controlled. He will continue his current meds and maintain a low sodium diet. 

## 2011-06-26 ENCOUNTER — Encounter (HOSPITAL_COMMUNITY): Payer: Medicare Other | Attending: Oncology

## 2011-06-26 ENCOUNTER — Other Ambulatory Visit (HOSPITAL_COMMUNITY): Payer: Self-pay | Admitting: Oncology

## 2011-06-26 DIAGNOSIS — D682 Hereditary deficiency of other clotting factors: Secondary | ICD-10-CM

## 2011-06-26 LAB — PROTIME-INR
INR: 2.28 — ABNORMAL HIGH (ref 0.00–1.49)
Prothrombin Time: 25.5 seconds — ABNORMAL HIGH (ref 11.6–15.2)

## 2011-07-07 ENCOUNTER — Other Ambulatory Visit (HOSPITAL_COMMUNITY): Payer: Self-pay | Admitting: Oncology

## 2011-07-10 ENCOUNTER — Other Ambulatory Visit (HOSPITAL_COMMUNITY): Payer: Self-pay | Admitting: Oncology

## 2011-07-10 ENCOUNTER — Encounter (HOSPITAL_BASED_OUTPATIENT_CLINIC_OR_DEPARTMENT_OTHER): Payer: Medicare Other

## 2011-07-10 DIAGNOSIS — D682 Hereditary deficiency of other clotting factors: Secondary | ICD-10-CM

## 2011-07-10 LAB — PROTIME-INR: Prothrombin Time: 27.5 seconds — ABNORMAL HIGH (ref 11.6–15.2)

## 2011-07-12 ENCOUNTER — Telehealth (HOSPITAL_COMMUNITY): Payer: Self-pay | Admitting: *Deleted

## 2011-07-12 ENCOUNTER — Other Ambulatory Visit (HOSPITAL_COMMUNITY): Payer: Self-pay | Admitting: Oncology

## 2011-07-12 DIAGNOSIS — D682 Hereditary deficiency of other clotting factors: Secondary | ICD-10-CM

## 2011-07-12 MED ORDER — WARFARIN SODIUM 1 MG PO TABS: ORAL_TABLET | ORAL | Status: DC

## 2011-07-12 NOTE — Telephone Encounter (Signed)
Pt needs 1 mg coumadin called in per [facility]

## 2011-07-24 ENCOUNTER — Other Ambulatory Visit (HOSPITAL_COMMUNITY): Payer: Self-pay | Admitting: Oncology

## 2011-07-24 ENCOUNTER — Encounter (HOSPITAL_COMMUNITY): Payer: Medicare Other | Attending: Oncology

## 2011-07-24 DIAGNOSIS — D682 Hereditary deficiency of other clotting factors: Secondary | ICD-10-CM

## 2011-07-24 LAB — PROTIME-INR: INR: 2.19 — ABNORMAL HIGH (ref 0.00–1.49)

## 2011-07-24 NOTE — Progress Notes (Signed)
Labs drawn today for pt.  Patient on 8.5mg  of coud a day. Call patient at [phone]

## 2011-08-03 ENCOUNTER — Encounter: Payer: Self-pay | Admitting: Cardiology

## 2011-08-07 ENCOUNTER — Encounter (HOSPITAL_BASED_OUTPATIENT_CLINIC_OR_DEPARTMENT_OTHER): Payer: Medicare Other

## 2011-08-07 ENCOUNTER — Other Ambulatory Visit (HOSPITAL_COMMUNITY): Payer: Self-pay | Admitting: Oncology

## 2011-08-07 DIAGNOSIS — D682 Hereditary deficiency of other clotting factors: Secondary | ICD-10-CM

## 2011-08-07 NOTE — Progress Notes (Signed)
Labs drawn today for pt. Patient on 8.5mg  of coud.  Call patient at [phone]

## 2011-08-15 ENCOUNTER — Other Ambulatory Visit (HOSPITAL_COMMUNITY): Payer: Self-pay

## 2011-08-15 DIAGNOSIS — D682 Hereditary deficiency of other clotting factors: Secondary | ICD-10-CM

## 2011-08-15 MED ORDER — WARFARIN SODIUM 1 MG PO TABS: ORAL_TABLET | ORAL | Status: DC

## 2011-08-28 ENCOUNTER — Other Ambulatory Visit (HOSPITAL_COMMUNITY): Payer: Self-pay | Admitting: Oncology

## 2011-08-28 ENCOUNTER — Encounter (HOSPITAL_COMMUNITY): Payer: Medicare Other | Attending: Oncology

## 2011-08-28 DIAGNOSIS — D682 Hereditary deficiency of other clotting factors: Secondary | ICD-10-CM

## 2011-08-28 LAB — PROTIME-INR
INR: 2.11 — ABNORMAL HIGH (ref 0.00–1.49)
Prothrombin Time: 24 seconds — ABNORMAL HIGH (ref 11.6–15.2)

## 2011-08-28 NOTE — Progress Notes (Signed)
Per patient, he is taking 8.5 mg every evening.

## 2011-08-28 NOTE — Progress Notes (Signed)
Labs drawn today for pt 

## 2011-09-02 ENCOUNTER — Other Ambulatory Visit (HOSPITAL_COMMUNITY): Payer: Self-pay | Admitting: Oncology

## 2011-09-18 ENCOUNTER — Encounter (HOSPITAL_COMMUNITY): Payer: Medicare Other | Attending: Oncology

## 2011-09-18 ENCOUNTER — Other Ambulatory Visit (HOSPITAL_COMMUNITY): Payer: Self-pay | Admitting: Oncology

## 2011-09-18 DIAGNOSIS — D682 Hereditary deficiency of other clotting factors: Secondary | ICD-10-CM

## 2011-09-18 LAB — PROTIME-INR
INR: 2.69 — ABNORMAL HIGH (ref 0.00–1.49)
Prothrombin Time: 29 seconds — ABNORMAL HIGH (ref 11.6–15.2)

## 2011-09-18 NOTE — Progress Notes (Signed)
Labs drawn today for pt 

## 2011-09-20 ENCOUNTER — Ambulatory Visit (INDEPENDENT_AMBULATORY_CARE_PROVIDER_SITE_OTHER): Payer: Medicare Other | Admitting: *Deleted

## 2011-09-20 ENCOUNTER — Encounter: Payer: Self-pay | Admitting: Internal Medicine

## 2011-09-20 DIAGNOSIS — I495 Sick sinus syndrome: Secondary | ICD-10-CM

## 2011-09-25 LAB — REMOTE PACEMAKER DEVICE
AL AMPLITUDE: 3.5 mv
AL IMPEDENCE PM: 472 Ohm
BAMS-0001: 170 {beats}/min
BATTERY VOLTAGE: 2.99 V
RV LEAD AMPLITUDE: 5.2 mv
RV LEAD IMPEDENCE PM: 480 Ohm

## 2011-10-09 ENCOUNTER — Other Ambulatory Visit (HOSPITAL_COMMUNITY): Payer: Self-pay | Admitting: Oncology

## 2011-10-09 ENCOUNTER — Encounter (HOSPITAL_BASED_OUTPATIENT_CLINIC_OR_DEPARTMENT_OTHER): Payer: Medicare Other

## 2011-10-09 DIAGNOSIS — D682 Hereditary deficiency of other clotting factors: Secondary | ICD-10-CM

## 2011-10-10 ENCOUNTER — Encounter: Payer: Self-pay | Admitting: *Deleted

## 2011-10-14 ENCOUNTER — Encounter (HOSPITAL_COMMUNITY): Payer: Self-pay | Admitting: Emergency Medicine

## 2011-10-14 ENCOUNTER — Inpatient Hospital Stay (HOSPITAL_COMMUNITY)
Admission: EM | Admit: 2011-10-14 | Discharge: 2011-10-18 | DRG: 392 | Disposition: A | Discharge status: Home / Self Care | Payer: Medicare Other | Attending: Internal Medicine | Admitting: Internal Medicine

## 2011-10-14 ENCOUNTER — Emergency Department (HOSPITAL_COMMUNITY): Payer: Medicare Other

## 2011-10-14 DIAGNOSIS — A09 Infectious gastroenteritis and colitis, unspecified: Principal | ICD-10-CM | POA: Diagnosis present

## 2011-10-14 DIAGNOSIS — R209 Unspecified disturbances of skin sensation: Secondary | ICD-10-CM | POA: Diagnosis present

## 2011-10-14 DIAGNOSIS — Z923 Personal history of irradiation: Secondary | ICD-10-CM

## 2011-10-14 DIAGNOSIS — Z86718 Personal history of other venous thrombosis and embolism: Secondary | ICD-10-CM

## 2011-10-14 DIAGNOSIS — Z9049 Acquired absence of other specified parts of digestive tract: Secondary | ICD-10-CM

## 2011-10-14 DIAGNOSIS — R112 Nausea with vomiting, unspecified: Secondary | ICD-10-CM

## 2011-10-14 DIAGNOSIS — M129 Arthropathy, unspecified: Secondary | ICD-10-CM

## 2011-10-14 DIAGNOSIS — E861 Hypovolemia: Secondary | ICD-10-CM

## 2011-10-14 DIAGNOSIS — Z86711 Personal history of pulmonary embolism: Secondary | ICD-10-CM

## 2011-10-14 DIAGNOSIS — Z888 Allergy status to other drugs, medicaments and biological substances status: Secondary | ICD-10-CM

## 2011-10-14 DIAGNOSIS — R030 Elevated blood-pressure reading, without diagnosis of hypertension: Secondary | ICD-10-CM

## 2011-10-14 DIAGNOSIS — R1110 Vomiting, unspecified: Secondary | ICD-10-CM | POA: Diagnosis present

## 2011-10-14 DIAGNOSIS — I440 Atrioventricular block, first degree: Secondary | ICD-10-CM | POA: Diagnosis present

## 2011-10-14 DIAGNOSIS — N318 Other neuromuscular dysfunction of bladder: Secondary | ICD-10-CM

## 2011-10-14 DIAGNOSIS — R109 Unspecified abdominal pain: Secondary | ICD-10-CM

## 2011-10-14 DIAGNOSIS — G609 Hereditary and idiopathic neuropathy, unspecified: Secondary | ICD-10-CM

## 2011-10-14 DIAGNOSIS — D682 Hereditary deficiency of other clotting factors: Secondary | ICD-10-CM

## 2011-10-14 DIAGNOSIS — Z95818 Presence of other cardiac implants and grafts: Secondary | ICD-10-CM

## 2011-10-14 DIAGNOSIS — R197 Diarrhea, unspecified: Secondary | ICD-10-CM

## 2011-10-14 DIAGNOSIS — R51 Headache: Secondary | ICD-10-CM

## 2011-10-14 DIAGNOSIS — K5289 Other specified noninfective gastroenteritis and colitis: Secondary | ICD-10-CM

## 2011-10-14 DIAGNOSIS — K449 Diaphragmatic hernia without obstruction or gangrene: Secondary | ICD-10-CM | POA: Diagnosis present

## 2011-10-14 DIAGNOSIS — Z87442 Personal history of urinary calculi: Secondary | ICD-10-CM

## 2011-10-14 DIAGNOSIS — H269 Unspecified cataract: Secondary | ICD-10-CM

## 2011-10-14 DIAGNOSIS — H8309 Labyrinthitis, unspecified ear: Secondary | ICD-10-CM

## 2011-10-14 DIAGNOSIS — Z9221 Personal history of antineoplastic chemotherapy: Secondary | ICD-10-CM

## 2011-10-14 DIAGNOSIS — Z96659 Presence of unspecified artificial knee joint: Secondary | ICD-10-CM

## 2011-10-14 DIAGNOSIS — G4700 Insomnia, unspecified: Secondary | ICD-10-CM | POA: Diagnosis present

## 2011-10-14 DIAGNOSIS — I495 Sick sinus syndrome: Secondary | ICD-10-CM

## 2011-10-14 DIAGNOSIS — Z7901 Long term (current) use of anticoagulants: Secondary | ICD-10-CM

## 2011-10-14 DIAGNOSIS — Z950 Presence of cardiac pacemaker: Secondary | ICD-10-CM

## 2011-10-14 DIAGNOSIS — M25519 Pain in unspecified shoulder: Secondary | ICD-10-CM | POA: Diagnosis present

## 2011-10-14 DIAGNOSIS — Z79899 Other long term (current) drug therapy: Secondary | ICD-10-CM

## 2011-10-14 DIAGNOSIS — R202 Paresthesia of skin: Secondary | ICD-10-CM | POA: Diagnosis present

## 2011-10-14 DIAGNOSIS — K37 Unspecified appendicitis: Secondary | ICD-10-CM | POA: Diagnosis present

## 2011-10-14 DIAGNOSIS — K529 Noninfective gastroenteritis and colitis, unspecified: Secondary | ICD-10-CM | POA: Diagnosis present

## 2011-10-14 DIAGNOSIS — E538 Deficiency of other specified B group vitamins: Secondary | ICD-10-CM

## 2011-10-14 DIAGNOSIS — M79609 Pain in unspecified limb: Secondary | ICD-10-CM

## 2011-10-14 DIAGNOSIS — Z85038 Personal history of other malignant neoplasm of large intestine: Secondary | ICD-10-CM

## 2011-10-14 DIAGNOSIS — R413 Other amnesia: Secondary | ICD-10-CM

## 2011-10-14 LAB — URINALYSIS, ROUTINE W REFLEX MICROSCOPIC
Bilirubin Urine: NEGATIVE
Glucose, UA: NEGATIVE mg/dL
Ketones, ur: NEGATIVE mg/dL
Leukocytes, UA: NEGATIVE
pH: 6 (ref 5.0–8.0)

## 2011-10-14 LAB — CBC WITH DIFFERENTIAL/PLATELET
Basophils Absolute: 0 10*3/uL (ref 0.0–0.1)
Basophils Relative: 0 % (ref 0–1)
Hemoglobin: 12.4 g/dL — ABNORMAL LOW (ref 13.0–17.0)
MCHC: 32.9 g/dL (ref 30.0–36.0)
Neutro Abs: 9.5 10*3/uL — ABNORMAL HIGH (ref 1.7–7.7)
Neutrophils Relative %: 83 % — ABNORMAL HIGH (ref 43–77)
RDW: 15.1 % (ref 11.5–15.5)

## 2011-10-14 LAB — COMPREHENSIVE METABOLIC PANEL
AST: 13 U/L (ref 0–37)
Albumin: 3.1 g/dL — ABNORMAL LOW (ref 3.5–5.2)
Alkaline Phosphatase: 60 U/L (ref 39–117)
Chloride: 100 mEq/L (ref 96–112)
Potassium: 3.9 mEq/L (ref 3.5–5.1)
Total Bilirubin: 0.6 mg/dL (ref 0.3–1.2)

## 2011-10-14 MED ORDER — HYDROMORPHONE HCL PF 1 MG/ML IJ SOLN
0.5000 mg | Freq: Once | INTRAMUSCULAR | Status: AC
  Administered 2011-10-14: 0.5 mg via INTRAVENOUS
  Filled 2011-10-14: qty 1

## 2011-10-14 MED ORDER — ACETAMINOPHEN 325 MG PO TABS
ORAL_TABLET | ORAL | Status: AC
  Administered 2011-10-14: 650 mg via ORAL
  Filled 2011-10-14: qty 2

## 2011-10-14 MED ORDER — ALBUTEROL SULFATE (5 MG/ML) 0.5% IN NEBU: 2.5000 mg | INHALATION_SOLUTION | RESPIRATORY_TRACT | Status: DC | PRN

## 2011-10-14 MED ORDER — ONDANSETRON HCL 4 MG/2ML IJ SOLN
4.0000 mg | Freq: Once | INTRAMUSCULAR | Status: AC
Start: 2011-10-14 — End: 2011-10-14
  Administered 2011-10-14: 4 mg via INTRAVENOUS
  Filled 2011-10-14: qty 2

## 2011-10-14 MED ORDER — CIPROFLOXACIN IN D5W 400 MG/200ML IV SOLN
400.0000 mg | Freq: Once | INTRAVENOUS | Status: DC
  Administered 2011-10-14: 400 mg via INTRAVENOUS
  Filled 2011-10-14: qty 200

## 2011-10-14 MED ORDER — METRONIDAZOLE IN NACL 5-0.79 MG/ML-% IV SOLN
500.0000 mg | Freq: Three times a day (TID) | INTRAVENOUS | Status: DC
  Administered 2011-10-15 – 2011-10-16 (×5): 500 mg via INTRAVENOUS
  Filled 2011-10-14 (×8): qty 100

## 2011-10-14 MED ORDER — ACETAMINOPHEN 325 MG PO TABS
650.0000 mg | ORAL_TABLET | Freq: Once | ORAL | Status: AC
  Administered 2011-10-14: 650 mg via ORAL

## 2011-10-14 MED ORDER — SODIUM CHLORIDE 0.9 % IV BOLUS (SEPSIS)
500.0000 mL | Freq: Once | INTRAVENOUS | Status: AC
  Administered 2011-10-14: 500 mL via INTRAVENOUS

## 2011-10-14 MED ORDER — MORPHINE SULFATE 2 MG/ML IJ SOLN
1.0000 mg | INTRAMUSCULAR | Status: DC | PRN
  Administered 2011-10-15: 1 mg via INTRAVENOUS
  Filled 2011-10-14: qty 1

## 2011-10-14 MED ORDER — WARFARIN SODIUM 7.5 MG PO TABS
7.5000 mg | ORAL_TABLET | ORAL | Status: AC
  Administered 2011-10-14: 7.5 mg via ORAL
  Filled 2011-10-14: qty 1

## 2011-10-14 MED ORDER — ACETAMINOPHEN 325 MG PO TABS
650.0000 mg | ORAL_TABLET | Freq: Four times a day (QID) | ORAL | Status: DC | PRN
  Filled 2011-10-14: qty 2

## 2011-10-14 MED ORDER — ACETAMINOPHEN 650 MG RE SUPP: 650.0000 mg | Freq: Four times a day (QID) | RECTAL | Status: DC | PRN

## 2011-10-14 MED ORDER — SODIUM CHLORIDE 0.9 % IV SOLN
INTRAVENOUS | Status: DC
  Administered 2011-10-15: 04:00:00 via INTRAVENOUS

## 2011-10-14 MED ORDER — CIPROFLOXACIN IN D5W 400 MG/200ML IV SOLN
400.0000 mg | Freq: Two times a day (BID) | INTRAVENOUS | Status: DC
  Administered 2011-10-15 – 2011-10-16 (×3): 400 mg via INTRAVENOUS
  Filled 2011-10-14 (×5): qty 200

## 2011-10-14 MED ORDER — WARFARIN - PHARMACIST DOSING INPATIENT: Freq: Every day | Status: DC

## 2011-10-14 MED ORDER — IOHEXOL 300 MG/ML  SOLN
80.0000 mL | Freq: Once | INTRAMUSCULAR | Status: AC | PRN
  Administered 2011-10-14: 80 mL via INTRAVENOUS

## 2011-10-14 MED ORDER — METRONIDAZOLE IN NACL 5-0.79 MG/ML-% IV SOLN
500.0000 mg | Freq: Once | INTRAVENOUS | Status: DC
  Filled 2011-10-14: qty 100

## 2011-10-14 NOTE — ED Notes (Signed)
Monitor shows paced rhythm at times and what looks like sinus rhythm when rate is greater than 60.

## 2011-10-14 NOTE — ED Provider Notes (Signed)
History     CSN: [id number]  Arrival date & time 10/14/11  1641   First MD Initiated Contact with Patient 10/14/11 1718      Chief Complaint  Patient presents with  . Abdominal Pain  . Diarrhea  . Fever  . Urinary Retention    (Consider location/radiation/quality/duration/timing/severity/associated sxs/prior treatment) HPI  Patient presents with wife. Some history is obtained from patient's son is from wife. He states that his pain began this morning around 11:00. He had eaten breakfast without difficulty. He also had some lunch. He has had 3 episodes of vomiting since that time. He had some undigested food and some yellowish substance. He has had crampy diffuse upper abdominal pain with subjective fever and continues to have nausea just recently vomiting my valuation. He states he had a small bowel movement here but did not describe diarrhea as noted in the nurses notes.had a history of colon cancer with resection in the past. He is unclear if he had his appendix but thinks that his gallbladder has been removed. He has not had similar pain in the past. The pain is in the epigastric region it is sharp it is crampy and has been constant in nature. Has had some difficulty urinating but this has been present for months to a year.  Past Medical History  Diagnosis Date  . Colon cancer     hx of metastic- treated with surgery and readiation  . DVT (deep venous thrombosis)     hx of s/p Greenfield filter  . OAB (overactive bladder)   . Arthritis   . Cataracts, bilateral   . FH: factor V Leiden deficiency   . History of nephrolithiasis   . History of ankle fracture   . 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  . Peripheral neuropathy   . Vitamin B12 deficiency   . Symptomatic bradycardia   . Pacemaker   . Sinoatrial node dysfunction   . Sinoatrial node dysfunction     Past Surgical History  Procedure Date  . Colectomy     sigmoid secondary to colon ca- 1997  .  Exploratory laparotomy     metastatic retroperitoneal lymph node- 2002  . Total knee arthroplasty     right-1991; left-1992  . Back surgeries     x3  . Filter replacement   . Left ankle surgery   . Incisional herniorraphy   . Pacemaker insertion   . Replacement total knee bilateral   . Colon resection   . Colonoscopy 08/18/2010    Procedure: COLONOSCOPY;  Surgeon: Malissa Hippo, MD;  Location: [facility];  Service: Endoscopy;  Laterality: N/A;  . Esophagogastroduodenoscopy 08/18/2010    Procedure: ESOPHAGOGASTRODUODENOSCOPY (EGD);  Surgeon: Malissa Hippo, MD;  Location: [facility];  Service: Endoscopy;  Laterality: N/A;    Family History  Problem Relation Age of Onset  . Anesthesia problems Neg Hx   . Hypotension Neg Hx   . Malignant hyperthermia Neg Hx   . Pseudochol deficiency Neg Hx   . Cancer Sister     History  Substance Use Topics  . Smoking status: Never Smoker   . Smokeless tobacco: Never Used  . Alcohol Use: No      Review of Systems  Constitutional: Positive for fever and chills.  HENT: Negative for neck stiffness.   Eyes: Negative for visual disturbance.  Respiratory: Negative for shortness of breath.   Cardiovascular: Negative for chest pain.  Gastrointestinal: Positive for nausea, vomiting  and abdominal pain. Negative for diarrhea and blood in stool.  Genitourinary: Negative for dysuria, frequency and decreased urine volume.  Musculoskeletal: Negative for myalgias and joint swelling.  Skin: Negative for rash.  Neurological: Negative for weakness.  Hematological: Negative for adenopathy.  Psychiatric/Behavioral: Negative for agitation.    Allergies  Celecoxib  Home Medications   Current Outpatient Rx  Name Route Sig Dispense Refill  . FLUTICASONE PROPIONATE 0.05 % EX CREA Topical Apply 1 application topically daily.    . WARFARIN SODIUM 1 MG PO TABS Oral Take 1 mg by mouth daily. Take with 7.5mg  to equal 8.5mg     . WARFARIN SODIUM 7.5 MG  PO TABS Oral Take 7.5 mg by mouth daily. Take 7.5mg  (1 tablet ) along with 1 mg tablet for a total of 8.5 mg daily      BP 147/54  Pulse 64  Temp 101.8 F (38.8 C) (Rectal)  Resp 16  Ht 5\' 10"  (1.778 m)  Wt 160 lb (72.576 kg)  BMI 22.96 kg/m2  SpO2 94%  Physical Exam  Nursing note and vitals reviewed. Constitutional: He is oriented to person, place, and time. He appears well-developed and well-nourished.       Rectal temp reveals elevated temp of 102  HENT:  Head: Normocephalic and atraumatic.       Mucous membranes dry  Eyes: Conjunctivae normal and EOM are normal. Pupils are equal, round, and reactive to light.  Neck: Normal range of motion. Neck supple.  Cardiovascular: Normal rate and regular rhythm.   Pulmonary/Chest: Effort normal and breath sounds normal.  Abdominal: Soft. Bowel sounds are normal. There is tenderness.       Tender right side with rlq greater than upper quadrant  Musculoskeletal: Normal range of motion.  Neurological: He is alert and oriented to person, place, and time.  Skin: Skin is warm and dry.  Psychiatric: He has a normal mood and affect.    ED Course  Procedures (including critical care time)  Labs Reviewed  CBC WITH DIFFERENTIAL - Abnormal; Notable for the following:    WBC 11.5 (*)     Hemoglobin 12.4 (*)     HCT 37.7 (*)     Neutrophils Relative 83 (*)     Neutro Abs 9.5 (*)     Lymphocytes Relative 6 (*)     All other components within normal limits  COMPREHENSIVE METABOLIC PANEL - Abnormal; Notable for the following:    Glucose, Bld 131 (*)     Creatinine, Ser 1.68 (*)     Albumin 3.1 (*)     GFR calc non Af Amer 35 (*)     GFR calc Af Amer 41 (*)     All other components within normal limits  PROTIME-INR - Abnormal; Notable for the following:    Prothrombin Time 24.4 (*)     INR 2.32 (*)     All other components within normal limits  LIPASE, BLOOD  URINALYSIS, ROUTINE W REFLEX MICROSCOPIC   No results found.   No  diagnosis found.    Ct Abdomen Pelvis W Contrast  10/14/2011  *RADIOLOGY REPORT*  Clinical Data: Abdominal pain, nausea, vomiting diarrhea.  History colon cancer.  CT ABDOMEN AND PELVIS WITH CONTRAST  Technique:  Multidetector CT imaging of the abdomen and pelvis was performed following the standard protocol during bolus administration of intravenous contrast.  Contrast:  80 ml Omnipaque-300.  Comparison: CT abdomen and pelvis 06/01/2011.  Findings: Small hiatal hernia is identified.  Dependent  atelectasis is seen in the lung bases.  Atherosclerotic vascular disease is noted.  No pleural or pericardial effusion.  There is an unchanged low attenuating lesion in the posterior right hepatic lobe measuring 1.8 cm in diameter, likely a cyst.  The liver is otherwise unremarkable.  The patient is status post cholecystectomy.  Mild intra and extrahepatic biliary ductal prominence is unchanged.  The adrenal glands and spleen appear normal.  There is atrophy of both kidneys, much worse on the left, which is stable in appearance. Small nonobstructing stone lower pole of the right kidney is unchanged.  Small stone in the left renal pelvis is also unchanged.  Urinary bladder stone is again identified.  The prostate gland is enlarged.  The pancreas is unremarkable.  There is marked wall thickening of the cecum with infiltration of surrounding fat which is new since the prior study.  Surgical anastomoses of bowel in the left abdomen are again identified. Diverticular disease is noted.  There is no focal fluid collection. No lymphadenopathy is identified.  IVC filter is in place.  The patient is status post lower lumbar laminectomy.  No lytic or sclerotic lesion is identified.  IMPRESSION:  1.  Marked thickening of the walls of the cecum with infiltration of surrounding fat is most likely due to infectious or inflammatory colitis as the finding is new since the patient's study approximately 4 months ago.  If the patient is not  up-to-date with colon cancer screening, screening is recommended after his acute episode has passed. 2.  No change in bilateral renal stones and urinary bladder stone. 3.  Status post cholecystectomy. 4.  Enlarged prostate gland.   Original Report Authenticated By: Bernadene Bell. Maricela Curet, M.D.        Patient with thickening of wall of cecum that is consistent with his abdominal pain. He is given Cipro and Flagyl here in the emergency department. His INR is elevated consistent with his anticoagulation however he does have guaiac positive stool  Patient's care discussed with Dr. Osvaldo Shipper. He we placed in a telemetry bed on team [facility].  Hilario Quarry, MD 10/14/11 2054

## 2011-10-14 NOTE — Progress Notes (Signed)
ANTICOAGULATION CONSULT NOTE - Initial Consult  Pharmacy Consult for Coumadin Indication: hx VTE  Allergies  Allergen Reactions  . Celecoxib Rash    CELEBREX    Patient Measurements: Height: 5\' 10"  (177.8 cm) Weight: 162 lb 11.2 oz (73.8 kg) IBW/kg (Calculated) : 73   Vital Signs: Temp: 98.3 F (36.8 C) (09/29 2219) Temp src: Oral (09/29 2219) BP: 150/61 mmHg (09/29 2219) Pulse Rate: 80  (09/29 2219)  Labs:  Basename 10/14/11 1742  HGB 12.4*  HCT 37.7*  PLT 215  APTT --  LABPROT 24.4*  INR 2.32*  HEPARINUNFRC --  CREATININE 1.68*  CKTOTAL --  CKMB --  TROPONINI --    Estimated Creatinine Clearance: 32.6 ml/min (by C-G formula based on Cr of 1.68).   Medical History: Past Medical History  Diagnosis Date  . Colon cancer     hx of metastic- treated with surgery and readiation  . DVT (deep venous thrombosis)     hx of s/p Greenfield filter  . OAB (overactive bladder)   . Arthritis   . Cataracts, bilateral   . FH: factor V Leiden deficiency   . History of nephrolithiasis   . History of ankle fracture   . 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  . Peripheral neuropathy   . Vitamin B12 deficiency   . Symptomatic bradycardia   . Pacemaker   . Sinoatrial node dysfunction   . Sinoatrial node dysfunction     Medications:  Scheduled:    . sodium chloride   Intravenous STAT  . acetaminophen  650 mg Oral Once  . ciprofloxacin  400 mg Intravenous Q12H  .  HYDROmorphone (DILAUDID) injection  0.5 mg Intravenous Once  . metronidazole  500 mg Intravenous Q8H  . ondansetron (ZOFRAN) IV  4 mg Intravenous Once  . sodium chloride  500 mL Intravenous Once  . DISCONTD: ciprofloxacin  400 mg Intravenous Once  . DISCONTD: metronidazole  500 mg Intravenous Once    Assessment: 78 yo M on chronic warfarin 8.5mg  daily for hx DVT/PE. He was admitted today for colitis and started on Cipro & Flagyl which can interact with warfarin to elevate INR.  INR  therapeutic on admission.   Goal of Therapy:  INR 2-3   Plan:  1) Coumadin 7.5mg  po x 1 now 2) Daily INR for now  Elson Clan 10/14/2011,10:58 PM

## 2011-10-14 NOTE — ED Notes (Signed)
Pt states he began feeling bad suddenly while at church today at 1100. Pt points to epigastric area for location of pain. Pt is tender to right abdomen. Vomited x 3 prior to being roomed in ED. PT currently denies nausea. Pt states pain is intermittent.

## 2011-10-14 NOTE — ED Notes (Signed)
Completed 1 full bottle and 1/2 bottle of contrast - feels like he will vomit if he drinks more - advised to stop drinking contrast and if feels like more fine.  Prefer he keeps down what he has at this time.

## 2011-10-14 NOTE — ED Notes (Signed)
Pt c/o nausea/vomiting/diarrhea/abd pain/fever that started today at 1100. Pt also c/o feeling the need to urinate but only a small amount comes out.

## 2011-10-14 NOTE — H&P (Signed)
Justin Lang is an 78 y.o. male.    PCP: Dwana Melena, MD  His oncologist is Dr. Mariel Sleet. His electrophysiologist is Dr. Ladona Ridgel  Chief Complaint: Abdominal pain, nausea, vomiting, and diarrhea  HPI: This is 78 year old, Caucasian male, with a past medical history of colon cancer in remission, factor V Leyden, on chronic anticoagulation for history of PE and DVT, who was in his usual state of health about 11:00 this morning when he was at church. He was feeling very cold. He went home. His wife applied some electrical warming blankets. Patient did not feel any better. He was having chills. Temperature was not checked. And, then he started having abdominal pain. Was 9/10 in intensity. He was located in the middle part of his abdomen. He came to the emergency department and then he vomited 3 times. The pain improved a little bit. The vomitus consisted of greenish material and some food. Denies any blood in the emesis. He has a history of loose stools chronically, and did have some loose stools this morning. His pain is somewhat better now with the pain medications. He denied any radiation of the pain. Pain was sharp in nature. Denies any dizziness or lightheadedness. He had a colonoscopy in August of 2012. It showed diverticulosis in the left colon. No other significant abnormalities were noted. He was admitted in May of this year for small bowel obstruction.   Home Medications: Prior to Admission medications   Medication Sig Start Date End Date Taking? Authorizing Provider  fluticasone (CUTIVATE) 0.05 % cream Apply 1 application topically daily.   Yes Historical Provider, MD  warfarin (COUMADIN) 1 MG tablet Take 1 mg by mouth daily. Take with 7.5mg  to equal 8.5mg  08/15/11  Yes Randall An, MD  warfarin (COUMADIN) 7.5 MG tablet Take 7.5 mg by mouth daily. Take 7.5mg  (1 tablet ) along with 1 mg tablet for a total of 8.5 mg daily   Yes Historical Provider, MD    Allergies:  Allergies    Allergen Reactions  . Celecoxib Rash    CELEBREX    Past Medical History: Past Medical History  Diagnosis Date  . Colon cancer     hx of metastic- treated with surgery and readiation  . DVT (deep venous thrombosis)     hx of s/p Greenfield filter  . OAB (overactive bladder)   . Arthritis   . Cataracts, bilateral   . FH: factor V Leiden deficiency   . History of nephrolithiasis   . History of ankle fracture   . 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  . Peripheral neuropathy   . Vitamin B12 deficiency   . Symptomatic bradycardia   . Pacemaker   . Sinoatrial node dysfunction   . Sinoatrial node dysfunction     Past Surgical History  Procedure Date  . Colectomy     sigmoid secondary to colon ca- 1997  . Exploratory laparotomy     metastatic retroperitoneal lymph node- 2002  . Total knee arthroplasty     right-1991; left-1992  . Back surgeries     x3  . Filter replacement   . Left ankle surgery   . Incisional herniorraphy   . Pacemaker insertion   . Replacement total knee bilateral   . Colon resection   . Colonoscopy 08/18/2010    Procedure: COLONOSCOPY;  Surgeon: Malissa Hippo, MD;  Location: [facility];  Service: Endoscopy;  Laterality: N/A;  . Esophagogastroduodenoscopy 08/18/2010    Procedure:  ESOPHAGOGASTRODUODENOSCOPY (EGD);  Surgeon: Malissa Hippo, MD;  Location: [facility];  Service: Endoscopy;  Laterality: N/A;    Social History:  reports that he has never smoked. He has never used smokeless tobacco. He reports that he does not drink alcohol or use illicit drugs.  Family History:  Family History  Problem Relation Age of Onset  . Anesthesia problems Neg Hx   . Hypotension Neg Hx   . Malignant hyperthermia Neg Hx   . Pseudochol deficiency Neg Hx   . Cancer Sister     Review of Systems - History obtained from the patient General ROS: positive for  - chills Psychological ROS: negative Ophthalmic ROS: negative ENT ROS:  negative Allergy and Immunology ROS: negative Hematological and Lymphatic ROS: negative Endocrine ROS: negative Respiratory ROS: no cough, shortness of breath, or wheezing Cardiovascular ROS: no chest pain or dyspnea on exertion Gastrointestinal ROS: as in hpi Genito-Urinary ROS: no dysuria, trouble voiding, or hematuria Musculoskeletal ROS: negative Neurological ROS: no TIA or stroke symptoms Dermatological ROS: negative  Physical Examination Blood pressure 142/67, pulse 64, temperature 98.3 F (36.8 C), temperature source Oral, resp. rate 20, height 5\' 10"  (1.778 m), weight 72.576 kg (160 lb), SpO2 94.00%.  General appearance: alert, cooperative, appears stated age and no distress Head: Normocephalic, without obvious abnormality, atraumatic Eyes: conjunctivae/corneas clear. PERRL, EOM's intact.  Throat: lips, mucosa, and tongue normal; teeth and gums normal Neck: no adenopathy, no carotid bruit, no JVD, supple, symmetrical, trachea midline and thyroid not enlarged, symmetric, no tenderness/mass/nodules Resp: clear to auscultation bilaterally Cardio: regular rate and rhythm, S1, S2 normal, no murmur, click, rub or gallop. A mass suggestive of Pacemaker is noted on the chest wall. GI: His abdomen is soft. Tenderness is present in the right lower quadrant, but not severe. No rebound, rigidity, or guarding. No masses, or organomegaly.  Extremities: extremities normal, atraumatic, no cyanosis or edema Pulses: 2+ and symmetric Skin: Skin color, texture, turgor normal. No rashes or lesions Lymph nodes: Cervical, supraclavicular, and axillary nodes normal. Neurologic: He is alert and oriented x3. No focal neurological deficits are present.  Laboratory Data: Results for orders placed during the hospital encounter of 10/14/11 (from the past 48 hour(s))  CBC WITH DIFFERENTIAL     Status: Abnormal   Collection Time   10/14/11  5:42 PM      Component Value Range Comment   WBC 11.5 (*) 4.0 -  10.5 K/uL    RBC 4.23  4.22 - 5.81 MIL/uL    Hemoglobin 12.4 (*) 13.0 - 17.0 g/dL    HCT 16.1 (*) 09.6 - 52.0 %    MCV 89.1  78.0 - 100.0 fL    MCH 29.3  26.0 - 34.0 pg    MCHC 32.9  30.0 - 36.0 g/dL    RDW 04.5  40.9 - 81.1 %    Platelets 215  150 - 400 K/uL    Neutrophils Relative 83 (*) 43 - 77 %    Neutro Abs 9.5 (*) 1.7 - 7.7 K/uL    Lymphocytes Relative 6 (*) 12 - 46 %    Lymphs Abs 0.7  0.7 - 4.0 K/uL    Monocytes Relative 9  3 - 12 %    Monocytes Absolute 1.0  0.1 - 1.0 K/uL    Eosinophils Relative 2  0 - 5 %    Eosinophils Absolute 0.2  0.0 - 0.7 K/uL    Basophils Relative 0  0 - 1 %  Basophils Absolute 0.0  0.0 - 0.1 K/uL   COMPREHENSIVE METABOLIC PANEL     Status: Abnormal   Collection Time   10/14/11  5:42 PM      Component Value Range Comment   Sodium 136  135 - 145 mEq/L    Potassium 3.9  3.5 - 5.1 mEq/L    Chloride 100  96 - 112 mEq/L    CO2 24  19 - 32 mEq/L    Glucose, Bld 131 (*) 70 - 99 mg/dL    BUN 20  6 - 23 mg/dL    Creatinine, Ser 7.82 (*) 0.50 - 1.35 mg/dL    Calcium 8.9  8.4 - 95.6 mg/dL    Total Protein 6.6  6.0 - 8.3 g/dL    Albumin 3.1 (*) 3.5 - 5.2 g/dL    AST 13  0 - 37 U/L    ALT 6  0 - 53 U/L    Alkaline Phosphatase 60  39 - 117 U/L    Total Bilirubin 0.6  0.3 - 1.2 mg/dL    GFR calc non Af Amer 35 (*) >90 mL/min    GFR calc Af Amer 41 (*) >90 mL/min   LIPASE, BLOOD     Status: Normal   Collection Time   10/14/11  5:42 PM      Component Value Range Comment   Lipase 21  11 - 59 U/L   PROTIME-INR     Status: Abnormal   Collection Time   10/14/11  5:42 PM      Component Value Range Comment   Prothrombin Time 24.4 (*) 11.6 - 15.2 seconds    INR 2.32 (*) 0.00 - 1.49   URINALYSIS, ROUTINE W REFLEX MICROSCOPIC     Status: Normal   Collection Time   10/14/11  7:15 PM      Component Value Range Comment   Color, Urine YELLOW  YELLOW    APPearance CLEAR  CLEAR    Specific Gravity, Urine 1.020  1.005 - 1.030    pH 6.0  5.0 - 8.0    Glucose,  UA NEGATIVE  NEGATIVE mg/dL    Hgb urine dipstick NEGATIVE  NEGATIVE    Bilirubin Urine NEGATIVE  NEGATIVE    Ketones, ur NEGATIVE  NEGATIVE mg/dL    Protein, ur NEGATIVE  NEGATIVE mg/dL    Urobilinogen, UA 0.2  0.0 - 1.0 mg/dL    Nitrite NEGATIVE  NEGATIVE    Leukocytes, UA NEGATIVE  NEGATIVE MICROSCOPIC NOT DONE ON URINES WITH NEGATIVE PROTEIN, BLOOD, LEUKOCYTES, NITRITE, OR GLUCOSE <1000 mg/dL.    Radiology Reports: Ct Abdomen Pelvis W Contrast  10/14/2011  *RADIOLOGY REPORT*  Clinical Data: Abdominal pain, nausea, vomiting diarrhea.  History colon cancer.  CT ABDOMEN AND PELVIS WITH CONTRAST  Technique:  Multidetector CT imaging of the abdomen and pelvis was performed following the standard protocol during bolus administration of intravenous contrast.  Contrast:  80 ml Omnipaque-300.  Comparison: CT abdomen and pelvis 06/01/2011.  Findings: Small hiatal hernia is identified.  Dependent atelectasis is seen in the lung bases.  Atherosclerotic vascular disease is noted.  No pleural or pericardial effusion.  There is an unchanged low attenuating lesion in the posterior right hepatic lobe measuring 1.8 cm in diameter, likely a cyst.  The liver is otherwise unremarkable.  The patient is status post cholecystectomy.  Mild intra and extrahepatic biliary ductal prominence is unchanged.  The adrenal glands and spleen appear normal.  There is atrophy of both kidneys,  much worse on the left, which is stable in appearance. Small nonobstructing stone lower pole of the right kidney is unchanged.  Small stone in the left renal pelvis is also unchanged.  Urinary bladder stone is again identified.  The prostate gland is enlarged.  The pancreas is unremarkable.  There is marked wall thickening of the cecum with infiltration of surrounding fat which is new since the prior study.  Surgical anastomoses of bowel in the left abdomen are again identified. Diverticular disease is noted.  There is no focal fluid collection. No  lymphadenopathy is identified.  IVC filter is in place.  The patient is status post lower lumbar laminectomy.  No lytic or sclerotic lesion is identified.  IMPRESSION:  1.  Marked thickening of the walls of the cecum with infiltration of surrounding fat is most likely due to infectious or inflammatory colitis as the finding is new since the patient's study approximately 4 months ago.  If the patient is not up-to-date with colon cancer screening, screening is recommended after his acute episode has passed. 2.  No change in bilateral renal stones and urinary bladder stone. 3.  Status post cholecystectomy. 4.  Enlarged prostate gland.   Original Report Authenticated By: Bernadene Bell. Maricela Curet, M.D.     Electrocardiogram: Sinus rhythm in at 67 beats per minute. First degree AV block is noted. Normal axis. No Q waves. No concerning ST or T-wave changes are noted.  Assessment/Plan  Principal Problem:  *Colitis Active Problems:  FACTOR V DEFICIENCY  VOMITING  COLON CANCER, HX OF  DVT, HX OF  GREENFIELD FILTER INSERTION, HX OF  Pacemaker  Anticoagulant long-term use   #1 colitis involving the cecal area: Etiology is unclear. Could be infectious. Stool studies will be obtained. He'll be treated with Cipro and Flagyl. We will have gastroenterology consult on him in the morning. Keep him n.p.o. except medications. Of note, he had EGD, and a colonoscopy in August of 2012. Diverticulosis was noted in the left colon.  #2 history of factor V Leyden on chronic anticoagulation: Will have pharmacy dose his warfarin. INR is therapeutic at this time.  #3 history of colon cancer, status post surgery, and chemotherapy in the past. His cancer is in remission. He goes for yearly checkups with his oncologist.   #4 history of pacemaker placement for complete heart block: This issue appears to be stable. Continue to monitor.  CODE STATUS was discussed with the patient and his wife and he would like to be FULL CODE for  now.  DVT prophylaxis: He is on warfarin, which will be continued.  Further management and decisions will depend on results of further testing and patient's response to treatment.  [facility]  [facility] Pager [phone]  10/14/2011, 9:25 PM

## 2011-10-15 DIAGNOSIS — Z86718 Personal history of other venous thrombosis and embolism: Secondary | ICD-10-CM

## 2011-10-15 DIAGNOSIS — K5289 Other specified noninfective gastroenteritis and colitis: Secondary | ICD-10-CM

## 2011-10-15 DIAGNOSIS — K529 Noninfective gastroenteritis and colitis, unspecified: Secondary | ICD-10-CM

## 2011-10-15 DIAGNOSIS — Z85038 Personal history of other malignant neoplasm of large intestine: Secondary | ICD-10-CM

## 2011-10-15 DIAGNOSIS — R197 Diarrhea, unspecified: Secondary | ICD-10-CM

## 2011-10-15 DIAGNOSIS — R110 Nausea: Secondary | ICD-10-CM

## 2011-10-15 LAB — CBC
HCT: 38.9 % — ABNORMAL LOW (ref 39.0–52.0)
Hemoglobin: 12.8 g/dL — ABNORMAL LOW (ref 13.0–17.0)
MCV: 90 fL (ref 78.0–100.0)
RBC: 4.32 MIL/uL (ref 4.22–5.81)
WBC: 13 10*3/uL — ABNORMAL HIGH (ref 4.0–10.5)

## 2011-10-15 LAB — COMPREHENSIVE METABOLIC PANEL
Albumin: 2.8 g/dL — ABNORMAL LOW (ref 3.5–5.2)
BUN: 18 mg/dL (ref 6–23)
Calcium: 8.6 mg/dL (ref 8.4–10.5)
Creatinine, Ser: 1.58 mg/dL — ABNORMAL HIGH (ref 0.50–1.35)
GFR calc Af Amer: 44 mL/min — ABNORMAL LOW (ref >90)
Glucose, Bld: 103 mg/dL — ABNORMAL HIGH (ref 70–99)
Potassium: 3.8 mEq/L (ref 3.5–5.1)
Total Protein: 6.3 g/dL (ref 6.0–8.3)

## 2011-10-15 LAB — PROTIME-INR
INR: 2.15 — ABNORMAL HIGH (ref 0.00–1.49)
Prothrombin Time: 23.1 seconds — ABNORMAL HIGH (ref 11.6–15.2)

## 2011-10-15 LAB — OCCULT BLOOD, POC DEVICE: Fecal Occult Bld: POSITIVE

## 2011-10-15 MED ORDER — SODIUM CHLORIDE 0.9 % IJ SOLN
INTRAMUSCULAR | Status: AC
  Administered 2011-10-15: 10 mL
  Filled 2011-10-15: qty 3

## 2011-10-15 MED ORDER — METRONIDAZOLE IN NACL 5-0.79 MG/ML-% IV SOLN
INTRAVENOUS | Status: AC
  Filled 2011-10-15: qty 100

## 2011-10-15 MED ORDER — POTASSIUM CHLORIDE IN NACL 20-0.9 MEQ/L-% IV SOLN
INTRAVENOUS | Status: DC
  Administered 2011-10-15 – 2011-10-16 (×2): via INTRAVENOUS

## 2011-10-15 MED ORDER — MORPHINE SULFATE 2 MG/ML IJ SOLN: 1.0000 mg | INTRAMUSCULAR | Status: DC | PRN

## 2011-10-15 MED ORDER — WARFARIN SODIUM 7.5 MG PO TABS
7.5000 mg | ORAL_TABLET | Freq: Once | ORAL | Status: AC
  Administered 2011-10-15: 7.5 mg via ORAL
  Filled 2011-10-15: qty 1

## 2011-10-15 NOTE — Consult Note (Signed)
Reason for Consult:Colitis Referring Physician: Zaheer Lang is an 78 y.o. male.  HPI:   10/15/2011, 8:35 AM  Justin Lang admitted yesterday thru the ED after becoming sick in church.  He tells me he began to have diarrhea and vomiting. He says he had a fever and in the ED noted to have a temp of 102.  He tells me he was several, watery BMs Sunday.  He says he had vomiting, but no hematemesis.  No blood in stool.  Crampy abdominal pain.  Since admission he has had 3-5 stools which were loose. He has a hx of colonic carcinoma and underwent a partial colectomy in 1997. His last colonoscopy was in 2012 by Dr. Karilyn Lang. See below. No recent antibiotics. Hx of DVT and PE Hx of rt and left knee replacement.  08/27/2010 EGD/Colonoscopy :Impression:  Small sliding hiatal hernia with mild changes of esophagitis at GE junction.  No evidence of peptic ulcer disease or vascular lesions.  Normal appearing appendiceal orifice.  Notable diverticula involving the left colon.  Colonic anastomosis located at 35 cm from the anal margin with 2 erosions at anastomosis.  No evidence of a recurrent colonic primary, polyps or endoscopic colitis  Suspect his diarrhea may be secondary to IBS since formed stool noted on this exam.      Past Medical History  Diagnosis Date  . Colon cancer     hx of metastic- treated with surgery and readiation  . DVT (deep venous thrombosis)     hx of s/p Greenfield filter  . OAB (overactive bladder)   . Arthritis   . Cataracts, bilateral   . FH: factor V Leiden deficiency   . History of nephrolithiasis   . History of ankle fracture   . 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  . Peripheral neuropathy   . Vitamin B12 deficiency   . Symptomatic bradycardia   . Pacemaker   . Sinoatrial node dysfunction   . Sinoatrial node dysfunction     Past Surgical History  Procedure Date  . Colectomy     sigmoid secondary to colon ca- 1997  . Exploratory  laparotomy     metastatic retroperitoneal lymph node- 2002  . Total knee arthroplasty     right-1991; left-1992  . Back surgeries     x3  . Filter replacement   . Left ankle surgery   . Incisional herniorraphy   . Pacemaker insertion   . Replacement total knee bilateral   . Colon resection   . Colonoscopy 08/18/2010    Procedure: COLONOSCOPY;  Surgeon: Justin Hippo, MD;  Location: [facility];  Service: Endoscopy;  Laterality: N/A;  . Esophagogastroduodenoscopy 08/18/2010    Procedure: ESOPHAGOGASTRODUODENOSCOPY (EGD);  Surgeon: Justin Hippo, MD;  Location: [facility];  Service: Endoscopy;  Laterality: N/A;    Family History  Problem Relation Age of Onset  . Anesthesia problems Neg Hx   . Hypotension Neg Hx   . Malignant hyperthermia Neg Hx   . Pseudochol deficiency Neg Hx   . Cancer Sister     Social History:  reports that he has never smoked. He has never used smokeless tobacco. He reports that he does not drink alcohol or use illicit drugs.  Allergies:  Allergies  Allergen Reactions  . Celecoxib Rash    CELEBREX    Medications: I have reviewed the patient's current medications.  Results for orders placed during the hospital encounter of 10/14/11 (from  the past 48 hour(s))  CBC WITH DIFFERENTIAL     Status: Abnormal   Collection Time   10/14/11  5:42 PM      Component Value Range Comment   WBC 11.5 (*) 4.0 - 10.5 K/uL    RBC 4.23  4.22 - 5.81 MIL/uL    Hemoglobin 12.4 (*) 13.0 - 17.0 g/dL    HCT 16.1 (*) 09.6 - 52.0 %    MCV 89.1  78.0 - 100.0 fL    MCH 29.3  26.0 - 34.0 pg    MCHC 32.9  30.0 - 36.0 g/dL    RDW 04.5  40.9 - 81.1 %    Platelets 215  150 - 400 K/uL    Neutrophils Relative 83 (*) 43 - 77 %    Neutro Abs 9.5 (*) 1.7 - 7.7 K/uL    Lymphocytes Relative 6 (*) 12 - 46 %    Lymphs Abs 0.7  0.7 - 4.0 K/uL    Monocytes Relative 9  3 - 12 %    Monocytes Absolute 1.0  0.1 - 1.0 K/uL    Eosinophils Relative 2  0 - 5 %    Eosinophils Absolute 0.2   0.0 - 0.7 K/uL    Basophils Relative 0  0 - 1 %    Basophils Absolute 0.0  0.0 - 0.1 K/uL   COMPREHENSIVE METABOLIC PANEL     Status: Abnormal   Collection Time   10/14/11  5:42 PM      Component Value Range Comment   Sodium 136  135 - 145 mEq/L    Potassium 3.9  3.5 - 5.1 mEq/L    Chloride 100  96 - 112 mEq/L    CO2 24  19 - 32 mEq/L    Glucose, Bld 131 (*) 70 - 99 mg/dL    BUN 20  6 - 23 mg/dL    Creatinine, Ser 9.14 (*) 0.50 - 1.35 mg/dL    Calcium 8.9  8.4 - 78.2 mg/dL    Total Protein 6.6  6.0 - 8.3 g/dL    Albumin 3.1 (*) 3.5 - 5.2 g/dL    AST 13  0 - 37 U/L    ALT 6  0 - 53 U/L    Alkaline Phosphatase 60  39 - 117 U/L    Total Bilirubin 0.6  0.3 - 1.2 mg/dL    GFR calc non Af Amer 35 (*) >90 mL/min    GFR calc Af Amer 41 (*) >90 mL/min   LIPASE, BLOOD     Status: Normal   Collection Time   10/14/11  5:42 PM      Component Value Range Comment   Lipase 21  11 - 59 U/L   PROTIME-INR     Status: Abnormal   Collection Time   10/14/11  5:42 PM      Component Value Range Comment   Prothrombin Time 24.4 (*) 11.6 - 15.2 seconds    INR 2.32 (*) 0.00 - 1.49   URINALYSIS, ROUTINE W REFLEX MICROSCOPIC     Status: Normal   Collection Time   10/14/11  7:15 PM      Component Value Range Comment   Color, Urine YELLOW  YELLOW    APPearance CLEAR  CLEAR    Specific Gravity, Urine 1.020  1.005 - 1.030    pH 6.0  5.0 - 8.0    Glucose, UA NEGATIVE  NEGATIVE mg/dL    Hgb urine dipstick NEGATIVE  NEGATIVE  Bilirubin Urine NEGATIVE  NEGATIVE    Ketones, ur NEGATIVE  NEGATIVE mg/dL    Protein, ur NEGATIVE  NEGATIVE mg/dL    Urobilinogen, UA 0.2  0.0 - 1.0 mg/dL    Nitrite NEGATIVE  NEGATIVE    Leukocytes, UA NEGATIVE  NEGATIVE MICROSCOPIC NOT DONE ON URINES WITH NEGATIVE PROTEIN, BLOOD, LEUKOCYTES, NITRITE, OR GLUCOSE <1000 mg/dL.  CLOSTRIDIUM DIFFICILE BY PCR     Status: Normal   Collection Time   10/15/11  2:30 AM      Component Value Range Comment   C difficile by pcr NEGATIVE   NEGATIVE   COMPREHENSIVE METABOLIC PANEL     Status: Abnormal   Collection Time   10/15/11  4:54 AM      Component Value Range Comment   Sodium 134 (*) 135 - 145 mEq/L    Potassium 3.8  3.5 - 5.1 mEq/L    Chloride 99  96 - 112 mEq/L    CO2 27  19 - 32 mEq/L    Glucose, Bld 103 (*) 70 - 99 mg/dL    BUN 18  6 - 23 mg/dL    Creatinine, Ser 0.86 (*) 0.50 - 1.35 mg/dL    Calcium 8.6  8.4 - 57.8 mg/dL    Total Protein 6.3  6.0 - 8.3 g/dL    Albumin 2.8 (*) 3.5 - 5.2 g/dL    AST 13  0 - 37 U/L    ALT 6  0 - 53 U/L    Alkaline Phosphatase 62  39 - 117 U/L    Total Bilirubin 1.0  0.3 - 1.2 mg/dL    GFR calc non Af Amer 38 (*) >90 mL/min    GFR calc Af Amer 44 (*) >90 mL/min   CBC     Status: Abnormal   Collection Time   10/15/11  4:54 AM      Component Value Range Comment   WBC 13.0 (*) 4.0 - 10.5 K/uL    RBC 4.32  4.22 - 5.81 MIL/uL    Hemoglobin 12.8 (*) 13.0 - 17.0 g/dL    HCT 46.9 (*) 62.9 - 52.0 %    MCV 90.0  78.0 - 100.0 fL    MCH 29.6  26.0 - 34.0 pg    MCHC 32.9  30.0 - 36.0 g/dL    RDW 52.8  41.3 - 24.4 %    Platelets 221  150 - 400 K/uL   PROTIME-INR     Status: Abnormal   Collection Time   10/15/11  4:54 AM      Component Value Range Comment   Prothrombin Time 23.1 (*) 11.6 - 15.2 seconds    INR 2.15 (*) 0.00 - 1.49     Ct Abdomen Pelvis W Contrast  10/14/2011  *RADIOLOGY REPORT*  Clinical Data: Abdominal pain, nausea, vomiting diarrhea.  History colon cancer.  CT ABDOMEN AND PELVIS WITH CONTRAST  Technique:  Multidetector CT imaging of the abdomen and pelvis was performed following the standard protocol during bolus administration of intravenous contrast.  Contrast:  80 ml Omnipaque-300.  Comparison: CT abdomen and pelvis 06/01/2011.  Findings: Small hiatal hernia is identified.  Dependent atelectasis is seen in the lung bases.  Atherosclerotic vascular disease is noted.  No pleural or pericardial effusion.  There is an unchanged low attenuating lesion in the posterior  right hepatic lobe measuring 1.8 cm in diameter, likely a cyst.  The liver is otherwise unremarkable.  The patient is status post cholecystectomy.  Mild  intra and extrahepatic biliary ductal prominence is unchanged.  The adrenal glands and spleen appear normal.  There is atrophy of both kidneys, much worse on the left, which is stable in appearance. Small nonobstructing stone lower pole of the right kidney is unchanged.  Small stone in the left renal pelvis is also unchanged.  Urinary bladder stone is again identified.  The prostate gland is enlarged.  The pancreas is unremarkable.  There is marked wall thickening of the cecum with infiltration of surrounding fat which is new since the prior study.  Surgical anastomoses of bowel in the left abdomen are again identified. Diverticular disease is noted.  There is no focal fluid collection. No lymphadenopathy is identified.  IVC filter is in place.  The patient is status post lower lumbar laminectomy.  No lytic or sclerotic lesion is identified.  IMPRESSION:  1.  Marked thickening of the walls of the cecum with infiltration of surrounding fat is most likely due to infectious or inflammatory colitis as the finding is new since the patient's study approximately 4 months ago.  If the patient is not up-to-date with colon cancer screening, screening is recommended after his acute episode has passed. 2.  No change in bilateral renal stones and urinary bladder stone. 3.  Status post cholecystectomy. 4.  Enlarged prostate gland.   Original Report Authenticated By: Bernadene Bell. D'ALESSIO, M.D.     ROS Blood pressure 150/56, pulse 61, temperature 98.8 F (37.1 C), temperature source Oral, resp. rate 16, height 5\' 10"  (1.778 m), weight 162 lb 11.2 oz (73.8 kg), SpO2 91.00%. Physical ExamAlert and oriented. Skin warm and dry. Oral mucosa is moist.   . Sclera anicteric, conjunctivae is pink. Thyroid not enlarged. No cervical lymphadenopathy. Lungs clear. Heart regular rate and  rhythm.  Abdomen is soft. Bowel sounds are positive. No hepatomegaly. No abdominal masses felt. Tenderness rt mid abdomen. Some tenderness all over..  No edema to lower extremities.  Assessment/Plan:Colitis, probably infectious.  Agree with Cipro and Flagyl IV. Clear liquids.  Further recommendations once we have stool studies back   GI attending note; Patient well known to me from previous evaluation. He has history of colon carcinoma and appears to be in remission and his last surveillance colonoscopy was in August 2012. Patient interviewed and examined. Abdominopelvic CT from 10/14/2011 reviewed. Lesion in right hepatic lobe is stable and appears to be cyst. He has extra side chronic changes to abdominal aorta along with calcification the takeoff of celiac trunk and SMA but they appear to be patent. Inflammatory changes noted to cecal wall and surrounding tissue. Patient's CO2 is normal. He is on chronic anticoagulation with INR of 2.15. Assessment; Acute typhlitis most likely secondary to an infection. Doubt that we're dealing with ischemic injury given exam and clinical course. Agree with empiric antibiotic therapy while waiting for stool studies. Will start patient on clear liquids and watch closely.

## 2011-10-15 NOTE — Progress Notes (Signed)
UR Chart Review Completed  

## 2011-10-15 NOTE — Progress Notes (Signed)
ANTICOAGULATION CONSULT NOTE   Pharmacy Consult for Coumadin Indication: hx VTE  Allergies  Allergen Reactions  . Celecoxib Rash    CELEBREX   Patient Measurements: Height: 5\' 10"  (177.8 cm) Weight: 162 lb 11.2 oz (73.8 kg) IBW/kg (Calculated) : 73   Vital Signs: Temp: 98.8 F (37.1 C) (09/30 0617) Temp src: Oral (09/30 0617) BP: 150/56 mmHg (09/30 0617) Pulse Rate: 61  (09/30 0740)  Labs:  Basename 10/15/11 0454 10/14/11 1742  HGB 12.8* 12.4*  HCT 38.9* 37.7*  PLT 221 215  APTT -- --  LABPROT 23.1* 24.4*  INR 2.15* 2.32*  HEPARINUNFRC -- --  CREATININE 1.58* 1.68*  CKTOTAL -- --  CKMB -- --  TROPONINI -- --   Estimated Creatinine Clearance: 34.7 ml/min (by C-G formula based on Cr of 1.58).  Medical History: Past Medical History  Diagnosis Date  . Colon cancer     hx of metastic- treated with surgery and readiation  . DVT (deep venous thrombosis)     hx of s/p Greenfield filter  . OAB (overactive bladder)   . Arthritis   . Cataracts, bilateral   . FH: factor V Leiden deficiency   . History of nephrolithiasis   . History of ankle fracture   . 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  . Peripheral neuropathy   . Vitamin B12 deficiency   . Symptomatic bradycardia   . Pacemaker   . Sinoatrial node dysfunction   . Sinoatrial node dysfunction    Medications:  Scheduled:     . sodium chloride   Intravenous STAT  . acetaminophen  650 mg Oral Once  . ciprofloxacin  400 mg Intravenous Q12H  .  HYDROmorphone (DILAUDID) injection  0.5 mg Intravenous Once  . metronidazole  500 mg Intravenous Q8H  . ondansetron (ZOFRAN) IV  4 mg Intravenous Once  . sodium chloride  500 mL Intravenous Once  . sodium chloride      . warfarin  7.5 mg Oral NOW  . warfarin  7.5 mg Oral ONCE-1800  . Warfarin - Pharmacist Dosing Inpatient   Does not apply q1800  . DISCONTD: ciprofloxacin  400 mg Intravenous Once  . DISCONTD: metronidazole  500 mg Intravenous Once    Assessment: 78 yo M on chronic warfarin 8.5mg  daily for hx DVT/PE. He was admitted today for colitis and started on Cipro & Flagyl which can interact with warfarin to elevate INR.  INR therapeutic.  Goal of Therapy:  INR 2-3   Plan:  1) Coumadin 7.5mg  po x 1 today 2) Daily INR for now  Valrie Hart A 10/15/2011,8:56 AM

## 2011-10-15 NOTE — Care Management Note (Signed)
    Page 1 of 1   10/18/2011     3:57:03 PM   CARE MANAGEMENT NOTE 10/18/2011  Patient:  Justin Lang, Justin Lang   Account Number:  [id number]  Date Initiated:  10/15/2011  Documentation initiated by:  Rosemary Holms  Subjective/Objective Assessment:   Pt admitted from home where he lives with his wife. Dx with Colitis, no C. Diff. Spoke to pt and wife at bedside. No HH needs anticipated     Action/Plan:   Anticipated DC Date:  10/18/2011   Anticipated DC Plan:  HOME/SELF CARE         Choice offered to / List presented to:             Status of service:  Completed, signed off Medicare Important Message given?  YES (If response is "NO", the following Medicare IM given date fields will be blank) Date Medicare IM given:  10/15/2011 Date Additional Medicare IM given:  10/18/2011  Discharge Disposition:  HOME/SELF CARE  Per UR Regulation:    If discussed at Long Length of Stay Meetings, dates discussed:    Comments:  10/15/11 1100 Roquel Burgin Leanord Hawking RN BSN CM

## 2011-10-15 NOTE — Progress Notes (Signed)
No complaints of diarrhea this evening. Pt is requesting diet to be advanced from clear liquids.  Dr. Lendell Caprice made aware, okay to advance to full liquid diet per MD.

## 2011-10-15 NOTE — Evaluation (Signed)
Physical Therapy Evaluation Patient Details Name: Justin Lang MRN: [id number] DOB: Jul 29, 1922 Today's Date: 10/15/2011 Time: 4098-1191 PT Time Calculation (min): 41 min  PT Assessment / Plan / Recommendation Clinical Impression  Pt is found to be at prior functional level.  No PT needed.  Will transition gait to nursing service.    PT Assessment  Patent does not need any further PT services    Follow Up Recommendations  No PT follow up    Barriers to Discharge        Equipment Recommendations  None recommended by PT    Recommendations for Other Services     Frequency      Precautions / Restrictions Precautions Precautions: None Restrictions Weight Bearing Restrictions: No   Pertinent Vitals/Pain       Mobility  Bed Mobility Bed Mobility: Supine to Sit;Sit to Supine Supine to Sit: 4: Min assist;HOB flat Sit to Supine: 6: Modified independent (Device/Increase time);HOB flat Transfers Transfers: Sit to Stand;Stand to Sit Sit to Stand: 6: Modified independent (Device/Increase time) Stand to Sit: 6: Modified independent (Device/Increase time) Ambulation/Gait Ambulation/Gait Assistance: 6: Modified independent (Device/Increase time) Ambulation Distance (Feet): 150 Feet Assistive device: Straight cane Ambulation/Gait Assistance Details: pt usually uses a cane when outside of the home Gait Pattern: Within Functional Limits Gait velocity: WNL Stairs: No Wheelchair Mobility Wheelchair Mobility: No    Shoulder Instructions     Exercises     PT Diagnosis:    PT Problem List:   PT Treatment Interventions:     PT Goals    Visit Information  Last PT Received On: 10/15/11    Subjective Data  Subjective: I have just a little stomach discomfort Patient Stated Goal: none stated   Prior Functioning  Home Living Lives With: Spouse Available Help at Discharge: Family Type of Home: House Home Access: Stairs to enter Secretary/administrator of Steps:  2 Entrance Stairs-Rails: Right;Left;Can reach both Home Layout: One level Bathroom Shower/Tub: Health visitor: Handicapped height Bathroom Accessibility: Yes How Accessible: Accessible via walker;Accessible via wheelchair Home Adaptive Equipment: Built-in shower seat;Grab bars in shower;Grab bars around toilet;Walker - rolling;Wheelchair - Doctor, general practice Prior Function Level of Independence: Independent Able to Take Stairs?: Yes Driving: Yes Vocation: Retired Musician: No difficulties    Cognition  Overall Cognitive Status: Appears within functional limits for tasks assessed/performed Arousal/Alertness: Awake/alert Orientation Level: Appears intact for tasks assessed Behavior During Session: [facility] for tasks performed    Extremity/Trunk Assessment Right Lower Extremity Assessment RLE ROM/Strength/Tone: Within functional levels Left Lower Extremity Assessment LLE ROM/Strength/Tone: Within functional levels Trunk Assessment Trunk Assessment: Normal   Balance Balance Balance Assessed: No  End of Session PT - End of Session Equipment Utilized During Treatment: Gait belt Activity Tolerance: Patient tolerated treatment well Patient left: in bed;with call bell/phone within reach;with bed alarm set;with family/visitor present  GP     Myrlene Broker L 10/15/2011, 9:40 AM

## 2011-10-15 NOTE — Progress Notes (Signed)
Chart reviewed. Discussed with Dr. Karilyn Cota.  Subjective: Complains of right lower quadrant pain. Nausea improved. Still with diarrhea.  Objective: Vital signs in last 24 hours: Filed Vitals:   10/14/11 2146 10/14/11 2219 10/15/11 0617 10/15/11 0740  BP: 148/59 150/61 150/56   Pulse: 60 80 60 61  Temp:  98.3 F (36.8 C) 98.8 F (37.1 C)   TempSrc:  Oral Oral   Resp: 15 16 16    Height:  5\' 10"  (1.778 m)    Weight:  73.8 kg (162 lb 11.2 oz)    SpO2: 94% 98% 99% 91%   Weight change:   Intake/Output Summary (Last 24 hours) at 10/15/11 1050 Last data filed at 10/15/11 0702  Gross per 24 hour  Intake 901.25 ml  Output      0 ml  Net 901.25 ml   General: Asleep. Arousable. Lungs clear to auscultation bilaterally without wheeze rhonchi or rales Cardiovascular regular rate rhythm without murmurs gallops rubs Abdomen normal bowel sounds soft. Right lower cord or tenderness with rebound tenderness and guarding. Extremities no clubbing cyanosis or edema  Lab Results: Basic Metabolic Panel:  Lab 10/15/11 1610 10/14/11 1742  NA 134* 136  K 3.8 3.9  CL 99 100  CO2 27 24  GLUCOSE 103* 131*  BUN 18 20  CREATININE 1.58* 1.68*  CALCIUM 8.6 8.9  MG -- --  PHOS -- --   Liver Function Tests:  Lab 10/15/11 0454 10/14/11 1742  AST 13 13  ALT 6 6  ALKPHOS 62 60  BILITOT 1.0 0.6  PROT 6.3 6.6  ALBUMIN 2.8* 3.1*    Lab 10/14/11 1742  LIPASE 21  AMYLASE --   No results found for this basename: AMMONIA:2 in the last 168 hours CBC:  Lab 10/15/11 0454 10/14/11 1742  WBC 13.0* 11.5*  NEUTROABS -- 9.5*  HGB 12.8* 12.4*  HCT 38.9* 37.7*  MCV 90.0 89.1  PLT 221 215   Cardiac Enzymes: No results found for this basename: CKTOTAL:3,CKMB:3,CKMBINDEX:3,TROPONINI:3 in the last 168 hours BNP: No results found for this basename: PROBNP:3 in the last 168 hours D-Dimer: No results found for this basename: DDIMER:2 in the last 168 hours CBG: No results found for this basename:  GLUCAP:6 in the last 168 hours Hemoglobin A1C: No results found for this basename: HGBA1C in the last 168 hours Fasting Lipid Panel: No results found for this basename: CHOL,HDL,LDLCALC,TRIG,CHOLHDL,LDLDIRECT in the last 960 hours Thyroid Function Tests: No results found for this basename: TSH,T4TOTAL,FREET4,T3FREE,THYROIDAB in the last 168 hours Coagulation:  Lab 10/15/11 0454 10/14/11 1742 10/09/11 0844  LABPROT 23.1* 24.4* 25.4*  INR 2.15* 2.32* 2.44*   Anemia Panel: No results found for this basename: VITAMINB12,FOLATE,FERRITIN,TIBC,IRON,RETICCTPCT in the last 168 hours Urine Drug Screen: Drugs of Abuse  No results found for this basename: labopia, cocainscrnur, labbenz, amphetmu, thcu, labbarb    Alcohol Level: No results found for this basename: ETH:2 in the last 168 hours Urinalysis:  Lab 10/14/11 1915  COLORURINE YELLOW  LABSPEC 1.020  PHURINE 6.0  GLUCOSEU NEGATIVE  HGBUR NEGATIVE  BILIRUBINUR NEGATIVE  KETONESUR NEGATIVE  PROTEINUR NEGATIVE  UROBILINOGEN 0.2  NITRITE NEGATIVE  LEUKOCYTESUR NEGATIVE   Micro Results: Recent Results (from the past 240 hour(s))  CLOSTRIDIUM DIFFICILE BY PCR     Status: Normal   Collection Time   10/15/11  2:30 AM      Component Value Range Status Comment   C difficile by pcr NEGATIVE  NEGATIVE Final    Studies/Results: Ct Abdomen Pelvis W  Contrast  10/14/2011  *RADIOLOGY REPORT*  Clinical Data: Abdominal pain, nausea, vomiting diarrhea.  History colon cancer.  CT ABDOMEN AND PELVIS WITH CONTRAST  Technique:  Multidetector CT imaging of the abdomen and pelvis was performed following the standard protocol during bolus administration of intravenous contrast.  Contrast:  80 ml Omnipaque-300.  Comparison: CT abdomen and pelvis 06/01/2011.  Findings: Small hiatal hernia is identified.  Dependent atelectasis is seen in the lung bases.  Atherosclerotic vascular disease is noted.  No pleural or pericardial effusion.  There is an unchanged  low attenuating lesion in the posterior right hepatic lobe measuring 1.8 cm in diameter, likely a cyst.  The liver is otherwise unremarkable.  The patient is status post cholecystectomy.  Mild intra and extrahepatic biliary ductal prominence is unchanged.  The adrenal glands and spleen appear normal.  There is atrophy of both kidneys, much worse on the left, which is stable in appearance. Small nonobstructing stone lower pole of the right kidney is unchanged.  Small stone in the left renal pelvis is also unchanged.  Urinary bladder stone is again identified.  The prostate gland is enlarged.  The pancreas is unremarkable.  There is marked wall thickening of the cecum with infiltration of surrounding fat which is new since the prior study.  Surgical anastomoses of bowel in the left abdomen are again identified. Diverticular disease is noted.  There is no focal fluid collection. No lymphadenopathy is identified.  IVC filter is in place.  The patient is status post lower lumbar laminectomy.  No lytic or sclerotic lesion is identified.  IMPRESSION:  1.  Marked thickening of the walls of the cecum with infiltration of surrounding fat is most likely due to infectious or inflammatory colitis as the finding is new since the patient's study approximately 4 months ago.  If the patient is not up-to-date with colon cancer screening, screening is recommended after his acute episode has passed. 2.  No change in bilateral renal stones and urinary bladder stone. 3.  Status post cholecystectomy. 4.  Enlarged prostate gland.   Original Report Authenticated By: Bernadene Bell. D'ALESSIO, M.D.    Scheduled Meds:   . sodium chloride   Intravenous STAT  . acetaminophen  650 mg Oral Once  . ciprofloxacin  400 mg Intravenous Q12H  .  HYDROmorphone (DILAUDID) injection  0.5 mg Intravenous Once  . metronidazole  500 mg Intravenous Q8H  . ondansetron (ZOFRAN) IV  4 mg Intravenous Once  . sodium chloride  500 mL Intravenous Once  . sodium  chloride      . warfarin  7.5 mg Oral NOW  . warfarin  7.5 mg Oral ONCE-1800  . Warfarin - Pharmacist Dosing Inpatient   Does not apply q1800  . DISCONTD: ciprofloxacin  400 mg Intravenous Once  . DISCONTD: metronidazole  500 mg Intravenous Once   Continuous Infusions:  PRN Meds:.acetaminophen, acetaminophen, iohexol, morphine injection, DISCONTD: albuterol Assessment/Plan: Principal Problem:  *Colitis presumed infectious. C. difficile PCR negative. Continue empiric antibiotics. Agree with clear liquids. Active Problems:  VOMITING, improved  VITAMIN B12 DEFICIENCY  FACTOR V DEFICIENCY  COLON CANCER, HX OF  DVT, HX OF  GREENFIELD FILTER INSERTION, HX OF  Pacemaker  Anticoagulant long-term use   LOS: 1 day   Emit Kuenzel L 10/15/2011, 10:50 AM

## 2011-10-16 DIAGNOSIS — R209 Unspecified disturbances of skin sensation: Secondary | ICD-10-CM

## 2011-10-16 DIAGNOSIS — K5289 Other specified noninfective gastroenteritis and colitis: Secondary | ICD-10-CM

## 2011-10-16 DIAGNOSIS — R202 Paresthesia of skin: Secondary | ICD-10-CM | POA: Diagnosis present

## 2011-10-16 LAB — CBC
Platelets: 192 10*3/uL (ref 150–400)
RDW: 14.9 % (ref 11.5–15.5)
WBC: 10.6 10*3/uL — ABNORMAL HIGH (ref 4.0–10.5)

## 2011-10-16 LAB — BASIC METABOLIC PANEL
Calcium: 8.2 mg/dL — ABNORMAL LOW (ref 8.4–10.5)
Creatinine, Ser: 1.62 mg/dL — ABNORMAL HIGH (ref 0.50–1.35)
GFR calc Af Amer: 43 mL/min — ABNORMAL LOW (ref >90)
GFR calc non Af Amer: 37 mL/min — ABNORMAL LOW (ref >90)

## 2011-10-16 LAB — PROTIME-INR
INR: 2.48 — ABNORMAL HIGH (ref 0.00–1.49)
Prothrombin Time: 25.7 seconds — ABNORMAL HIGH (ref 11.6–15.2)

## 2011-10-16 MED ORDER — HYDROCODONE-ACETAMINOPHEN 5-325 MG PO TABS
1.0000 | ORAL_TABLET | Freq: Four times a day (QID) | ORAL | Status: DC | PRN
  Administered 2011-10-17: 1 via ORAL
  Filled 2011-10-16: qty 1

## 2011-10-16 MED ORDER — SODIUM CHLORIDE 0.9 % IJ SOLN
INTRAMUSCULAR | Status: AC
  Administered 2011-10-16: 3 mL
  Filled 2011-10-16: qty 3

## 2011-10-16 MED ORDER — CIPROFLOXACIN HCL 250 MG PO TABS
500.0000 mg | ORAL_TABLET | Freq: Two times a day (BID) | ORAL | Status: DC
  Administered 2011-10-16 – 2011-10-18 (×4): 500 mg via ORAL
  Filled 2011-10-16 (×3): qty 2
  Filled 2011-10-16: qty 1
  Filled 2011-10-16: qty 2

## 2011-10-16 MED ORDER — METRONIDAZOLE 500 MG PO TABS
500.0000 mg | ORAL_TABLET | Freq: Three times a day (TID) | ORAL | Status: DC
  Administered 2011-10-16 – 2011-10-18 (×6): 500 mg via ORAL
  Filled 2011-10-16 (×6): qty 1

## 2011-10-16 MED ORDER — WARFARIN SODIUM 5 MG PO TABS
5.0000 mg | ORAL_TABLET | Freq: Once | ORAL | Status: AC
  Administered 2011-10-16: 5 mg via ORAL
  Filled 2011-10-16: qty 1

## 2011-10-16 NOTE — Progress Notes (Addendum)
Patient ID: Justin Lang, male   DOB: 1922-07-13, 78 y.o.   MRN: [id number] S. Feels better. No nausea and vomiting at this point. Tolerated his breakfast. Still has loose stools. No loose stools this am. Continues to have abdominal tenderness. Tawana Scale Vitals:   10/15/11 0617 10/15/11 0740 10/15/11 2151 10/16/11 0451  BP: 150/56  130/55 137/51  Pulse: 60 61 59 57  Temp: 98.8 F (37.1 C)  97.9 F (36.6 C) 97.9 F (36.6 C)  TempSrc: Oral  Oral Oral  Resp: 16  16 18   Height:      Weight:      SpO2: 99% 91% 96% 93%  A. Colitis. C difficile negative.  Plan: Agree with antibiotics till Stool culture are back.     GI attending note; Patient reports intermittent left pectoral pain but denies shortness of breath or diaphoreses. He now has soreness along right side of his abdomen but no sharp pain. He denies melena or rectal bleeding. He also denies nausea or vomiting. He is afebrile. Cardiac exam with regular rhythm normal S1 and S2. No murmur or gallop noted. Lungs are clear to auscultation. Abdominal exam reveals hyperactive bowel sounds soft abdomen with mild tenderness at right upper quadrant. No organomegaly or masses. WBC 10.6, H&H 11.5 and 35.4, platelet count 192K Stool studies are negative to date. Assessment; Acute right-sided colitis most likely secondary to infection. He appears to be improving with antibiotic therapy. He could be switched to oral antibiotic later today or in a.m.

## 2011-10-16 NOTE — Progress Notes (Signed)
ANTICOAGULATION CONSULT NOTE   Pharmacy Consult for Coumadin Indication: hx VTE  Allergies  Allergen Reactions  . Celecoxib Rash    CELEBREX   Patient Measurements: Height: 5\' 10"  (177.8 cm) Weight: 162 lb 11.2 oz (73.8 kg) IBW/kg (Calculated) : 73   Vital Signs: Temp: 97.9 F (36.6 C) (10/01 0451) Temp src: Oral (10/01 0451) BP: 137/51 mmHg (10/01 0451) Pulse Rate: 57  (10/01 0451)  Labs:  Basename 10/16/11 0502 10/15/11 0454 10/14/11 1742  HGB 11.5* 12.8* --  HCT 35.4* 38.9* 37.7*  PLT 192 221 215  APTT -- -- --  LABPROT 25.7* 23.1* 24.4*  INR 2.48* 2.15* 2.32*  HEPARINUNFRC -- -- --  CREATININE 1.62* 1.58* 1.68*  CKTOTAL -- -- --  CKMB -- -- --  TROPONINI -- -- --   Estimated Creatinine Clearance: 33.8 ml/min (by C-G formula based on Cr of 1.62).  Medical History: Past Medical History  Diagnosis Date  . Colon cancer     hx of metastic- treated with surgery and readiation  . DVT (deep venous thrombosis)     hx of s/p Greenfield filter  . OAB (overactive bladder)   . Arthritis   . Cataracts, bilateral   . FH: factor V Leiden deficiency   . History of nephrolithiasis   . History of ankle fracture   . 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  . Peripheral neuropathy   . Vitamin B12 deficiency   . Symptomatic bradycardia   . Pacemaker   . Sinoatrial node dysfunction   . Sinoatrial node dysfunction    Medications:  Scheduled:     . ciprofloxacin  400 mg Intravenous Q12H  . metronidazole  500 mg Intravenous Q8H  . warfarin  7.5 mg Oral ONCE-1800  . Warfarin - Pharmacist Dosing Inpatient   Does not apply q1800  . DISCONTD: sodium chloride   Intravenous STAT   Assessment: 78 yo M on chronic warfarin 8.5mg  daily for hx DVT/PE. He was admitted today for colitis and started on Cipro & Flagyl which can interact with warfarin to elevate INR.  INR therapeutic but rising some.  Goal of Therapy:  INR 2-3   Plan:  1) Coumadin 5mg  po x 1  today 2) Daily INR for now  Valrie Hart A 10/16/2011,10:30 AM

## 2011-10-16 NOTE — Progress Notes (Signed)
Subjective: Pain less severe. Still with diarrhea. No vomiting. Requests solid food. Tolerating full liquids. Patient reports long-standing paresthesias in his feet.  Objective: Vital signs in last 24 hours: Filed Vitals:   10/15/11 0617 10/15/11 0740 10/15/11 2151 10/16/11 0451  BP: 150/56  130/55 137/51  Pulse: 60 61 59 57  Temp: 98.8 F (37.1 C)  97.9 F (36.6 C) 97.9 F (36.6 C)  TempSrc: Oral  Oral Oral  Resp: 16  16 18   Height:      Weight:      SpO2: 99% 91% 96% 93%   Weight change:   Intake/Output Summary (Last 24 hours) at 10/16/11 1443 Last data filed at 10/16/11 0825  Gross per 24 hour  Intake   2319 ml  Output    850 ml  Net   1469 ml   General: Comfortable. Reading the newspaper. Lungs clear to auscultation bilaterally without wheeze rhonchi or rales Cardiovascular regular rate rhythm without murmurs gallops rubs Abdomen normal bowel sounds soft. Right lower quadrant less tender. Extremities no clubbing cyanosis or edema  Lab Results: Basic Metabolic Panel:  Lab 10/16/11 1610 10/15/11 0454  NA 136 134*  K 3.4* 3.8  CL 103 99  CO2 25 27  GLUCOSE 85 103*  BUN 15 18  CREATININE 1.62* 1.58*  CALCIUM 8.2* 8.6  MG -- --  PHOS -- --   Liver Function Tests:  Lab 10/15/11 0454 10/14/11 1742  AST 13 13  ALT 6 6  ALKPHOS 62 60  BILITOT 1.0 0.6  PROT 6.3 6.6  ALBUMIN 2.8* 3.1*    Lab 10/14/11 1742  LIPASE 21  AMYLASE --   No results found for this basename: AMMONIA:2 in the last 168 hours CBC:  Lab 10/16/11 0502 10/15/11 0454 10/14/11 1742  WBC 10.6* 13.0* --  NEUTROABS -- -- 9.5*  HGB 11.5* 12.8* --  HCT 35.4* 38.9* --  MCV 89.6 90.0 --  PLT 192 221 --   Cardiac Enzymes: No results found for this basename: CKTOTAL:3,CKMB:3,CKMBINDEX:3,TROPONINI:3 in the last 168 hours BNP: No results found for this basename: PROBNP:3 in the last 168 hours D-Dimer: No results found for this basename: DDIMER:2 in the last 168 hours CBG: No results  found for this basename: GLUCAP:6 in the last 168 hours Hemoglobin A1C: No results found for this basename: HGBA1C in the last 168 hours Fasting Lipid Panel: No results found for this basename: CHOL,HDL,LDLCALC,TRIG,CHOLHDL,LDLDIRECT in the last 960 hours Thyroid Function Tests: No results found for this basename: TSH,T4TOTAL,FREET4,T3FREE,THYROIDAB in the last 168 hours Coagulation:  Lab 10/16/11 0502 10/15/11 0454 10/14/11 1742  LABPROT 25.7* 23.1* 24.4*  INR 2.48* 2.15* 2.32*   Anemia Panel: No results found for this basename: VITAMINB12,FOLATE,FERRITIN,TIBC,IRON,RETICCTPCT in the last 168 hours Urine Drug Screen: Drugs of Abuse  No results found for this basename: labopia,  cocainscrnur,  labbenz,  amphetmu,  thcu,  labbarb    Alcohol Level: No results found for this basename: ETH:2 in the last 168 hours Urinalysis:  Lab 10/14/11 1915  COLORURINE YELLOW  LABSPEC 1.020  PHURINE 6.0  GLUCOSEU NEGATIVE  HGBUR NEGATIVE  BILIRUBINUR NEGATIVE  KETONESUR NEGATIVE  PROTEINUR NEGATIVE  UROBILINOGEN 0.2  NITRITE NEGATIVE  LEUKOCYTESUR NEGATIVE   Micro Results: Recent Results (from the past 240 hour(s))  CLOSTRIDIUM DIFFICILE BY PCR     Status: Normal   Collection Time   10/15/11  2:30 AM      Component Value Range Status Comment   C difficile by pcr NEGATIVE  NEGATIVE Final    Studies/Results: Ct Abdomen Pelvis W Contrast  10/14/2011  *RADIOLOGY REPORT*  Clinical Data: Abdominal pain, nausea, vomiting diarrhea.  History colon cancer.  CT ABDOMEN AND PELVIS WITH CONTRAST  Technique:  Multidetector CT imaging of the abdomen and pelvis was performed following the standard protocol during bolus administration of intravenous contrast.  Contrast:  80 ml Omnipaque-300.  Comparison: CT abdomen and pelvis 06/01/2011.  Findings: Small hiatal hernia is identified.  Dependent atelectasis is seen in the lung bases.  Atherosclerotic vascular disease is noted.  No pleural or pericardial  effusion.  There is an unchanged low attenuating lesion in the posterior right hepatic lobe measuring 1.8 cm in diameter, likely a cyst.  The liver is otherwise unremarkable.  The patient is status post cholecystectomy.  Mild intra and extrahepatic biliary ductal prominence is unchanged.  The adrenal glands and spleen appear normal.  There is atrophy of both kidneys, much worse on the left, which is stable in appearance. Small nonobstructing stone lower pole of the right kidney is unchanged.  Small stone in the left renal pelvis is also unchanged.  Urinary bladder stone is again identified.  The prostate gland is enlarged.  The pancreas is unremarkable.  There is marked wall thickening of the cecum with infiltration of surrounding fat which is new since the prior study.  Surgical anastomoses of bowel in the left abdomen are again identified. Diverticular disease is noted.  There is no focal fluid collection. No lymphadenopathy is identified.  IVC filter is in place.  The patient is status post lower lumbar laminectomy.  No lytic or sclerotic lesion is identified.  IMPRESSION:  1.  Marked thickening of the walls of the cecum with infiltration of surrounding fat is most likely due to infectious or inflammatory colitis as the finding is new since the patient's study approximately 4 months ago.  If the patient is not up-to-date with colon cancer screening, screening is recommended after his acute episode has passed. 2.  No change in bilateral renal stones and urinary bladder stone. 3.  Status post cholecystectomy. 4.  Enlarged prostate gland.   Original Report Authenticated By: Bernadene Bell. D'ALESSIO, M.D.    Scheduled Meds:    . ciprofloxacin  500 mg Oral BID  . metroNIDAZOLE  500 mg Oral Q8H  . warfarin  5 mg Oral ONCE-1800  . warfarin  7.5 mg Oral ONCE-1800  . Warfarin - Pharmacist Dosing Inpatient   Does not apply q1800  . DISCONTD: ciprofloxacin  400 mg Intravenous Q12H  . DISCONTD: metronidazole  500 mg  Intravenous Q8H   Continuous Infusions:    . DISCONTD: 0.9 % NaCl with KCl 20 mEq / L 75 mL/hr at 10/16/11 0406   PRN Meds:.acetaminophen, acetaminophen, HYDROcodone-acetaminophen, DISCONTD:  morphine injection Assessment/Plan: Principal Problem:  *Colitis presumed infectious. Improving. Stool culture pending. Agree with changing antibiotics to by mouth. Advance diet. Increase activity. Active Problems:  VOMITING, improved  VITAMIN B12 DEFICIENCY  FACTOR V DEFICIENCY  COLON CANCER, HX OF  DVT, HX OF  GREENFIELD FILTER INSERTION, HX OF  Pacemaker  Anticoagulant long-term use Paresthesias: Has history of B12 deficiency. Will check B12 level, as well as TSH and RBC folate.  LOS: 2 days   Sesilia Poucher L 10/16/2011, 2:43 PM

## 2011-10-17 DIAGNOSIS — E861 Hypovolemia: Secondary | ICD-10-CM

## 2011-10-17 LAB — FOLATE RBC: RBC Folate: 706 ng/mL — ABNORMAL HIGH (ref >=366)

## 2011-10-17 LAB — TSH: TSH: 1.929 u[IU]/mL (ref 0.350–4.500)

## 2011-10-17 MED ORDER — TRAZODONE HCL 50 MG PO TABS
50.0000 mg | ORAL_TABLET | Freq: Every day | ORAL | Status: DC
  Administered 2011-10-17: 50 mg via ORAL
  Filled 2011-10-17: qty 1

## 2011-10-17 MED ORDER — WARFARIN SODIUM 2.5 MG PO TABS
2.5000 mg | ORAL_TABLET | Freq: Once | ORAL | Status: AC
  Administered 2011-10-17: 2.5 mg via ORAL
  Filled 2011-10-17: qty 1

## 2011-10-17 MED ORDER — POTASSIUM CHLORIDE IN NACL 40-0.9 MEQ/L-% IV SOLN
INTRAVENOUS | Status: DC
  Administered 2011-10-17 – 2011-10-18 (×3): via INTRAVENOUS
  Filled 2011-10-17 (×7): qty 1000

## 2011-10-17 MED ORDER — BOOST PLUS PO LIQD
237.0000 mL | Freq: Three times a day (TID) | ORAL | Status: DC
  Administered 2011-10-17 – 2011-10-18 (×4): 237 mL via ORAL
  Filled 2011-10-17 (×8): qty 237

## 2011-10-17 NOTE — Progress Notes (Signed)
ANTICOAGULATION CONSULT NOTE   Pharmacy Consult for Coumadin Indication: hx VTE  Allergies  Allergen Reactions  . Celecoxib Rash    CELEBREX   Patient Measurements: Height: 5\' 10"  (177.8 cm) Weight: 162 lb 11.2 oz (73.8 kg) IBW/kg (Calculated) : 73   Vital Signs: Temp: 98.1 F (36.7 C) (10/02 0459) Temp src: Oral (10/02 0459) BP: 145/68 mmHg (10/02 0459) Pulse Rate: 63  (10/02 0459)  Labs:  Basename 10/17/11 0449 10/16/11 0502 10/15/11 0454 10/14/11 1742  HGB -- 11.5* 12.8* --  HCT -- 35.4* 38.9* 37.7*  PLT -- 192 221 215  APTT -- -- -- --  LABPROT 28.7* 25.7* 23.1* --  INR 2.88* 2.48* 2.15* --  HEPARINUNFRC -- -- -- --  CREATININE -- 1.62* 1.58* 1.68*  CKTOTAL -- -- -- --  CKMB -- -- -- --  TROPONINI -- -- -- --   Estimated Creatinine Clearance: 33.8 ml/min (by C-G formula based on Cr of 1.62).  Medical History: Past Medical History  Diagnosis Date  . Colon cancer     hx of metastic- treated with surgery and readiation  . DVT (deep venous thrombosis)     hx of s/p Greenfield filter  . OAB (overactive bladder)   . Arthritis   . Cataracts, bilateral   . FH: factor V Leiden deficiency   . History of nephrolithiasis   . History of ankle fracture   . 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  . Peripheral neuropathy   . Vitamin B12 deficiency   . Symptomatic bradycardia   . Pacemaker   . Sinoatrial node dysfunction   . Sinoatrial node dysfunction    Medications:  Scheduled:     . ciprofloxacin  500 mg Oral BID  . lactose free nutrition  237 mL Oral TID BM  . metroNIDAZOLE  500 mg Oral Q8H  . sodium chloride      . traZODone  50 mg Oral QHS  . warfarin  2.5 mg Oral ONCE-1800  . warfarin  5 mg Oral ONCE-1800  . Warfarin - Pharmacist Dosing Inpatient   Does not apply q1800  . DISCONTD: ciprofloxacin  400 mg Intravenous Q12H  . DISCONTD: metronidazole  500 mg Intravenous Q8H   Assessment: 78 yo M on chronic warfarin 8.5mg  daily for hx  DVT/PE. He was admitted today for colitis and started on Cipro & Flagyl which can interact with warfarin to elevate INR.   INR therapeutic but rising despite decreased warfarin doses .  Goal of Therapy:  INR 2-3   Plan:  1) Coumadin 2.5mg  po x 1 today 2) Daily INR for now  Meylin Stenzel, [facility] 10/17/2011,10:42 AM

## 2011-10-17 NOTE — Progress Notes (Addendum)
Subjective: Does not feel well. Couldn't sleep last night. Weak. Not eating. Has chronic diarrhea, usually about 3 times a day. Current episodes are more watery and frequent. Pain improved. Has not yet ambulated.  Objective: Vital signs in last 24 hours: Filed Vitals:   10/16/11 0451 10/16/11 1444 10/16/11 2120 10/17/11 0459  BP: 137/51 150/74 159/65 145/68  Pulse: 57 57 59 63  Temp: 97.9 F (36.6 C) 97.4 F (36.3 C) 97.4 F (36.3 C) 98.1 F (36.7 C)  TempSrc: Oral  Oral Oral  Resp: 18 19 19 17   Height:      Weight:      SpO2: 93% 97% 95% 93%   Weight change:   Intake/Output Summary (Last 24 hours) at 10/17/11 0901 Last data filed at 10/17/11 0600  Gross per 24 hour  Intake      0 ml  Output    350 ml  Net   -350 ml   General: Appears more weak. Tired. Uncomfortable. HEENT: Eyes sunken. Dry mucous membranes Lungs clear to auscultation bilaterally without wheeze rhonchi or rales Cardiovascular regular rate rhythm without murmurs gallops rubs Abdomen normal bowel sounds soft. Right lower quadrant less tender. Extremities no clubbing cyanosis or edema  Lab Results: Basic Metabolic Panel:  Lab 10/16/11 1610 10/15/11 0454  NA 136 134*  K 3.4* 3.8  CL 103 99  CO2 25 27  GLUCOSE 85 103*  BUN 15 18  CREATININE 1.62* 1.58*  CALCIUM 8.2* 8.6  MG -- --  PHOS -- --   Liver Function Tests:  Lab 10/15/11 0454 10/14/11 1742  AST 13 13  ALT 6 6  ALKPHOS 62 60  BILITOT 1.0 0.6  PROT 6.3 6.6  ALBUMIN 2.8* 3.1*    Lab 10/14/11 1742  LIPASE 21  AMYLASE --   No results found for this basename: AMMONIA:2 in the last 168 hours CBC:  Lab 10/16/11 0502 10/15/11 0454 10/14/11 1742  WBC 10.6* 13.0* --  NEUTROABS -- -- 9.5*  HGB 11.5* 12.8* --  HCT 35.4* 38.9* --  MCV 89.6 90.0 --  PLT 192 221 --   Cardiac Enzymes: No results found for this basename: CKTOTAL:3,CKMB:3,CKMBINDEX:3,TROPONINI:3 in the last 168 hours BNP: No results found for this basename: PROBNP:3  in the last 168 hours D-Dimer: No results found for this basename: DDIMER:2 in the last 168 hours CBG: No results found for this basename: GLUCAP:6 in the last 168 hours Hemoglobin A1C: No results found for this basename: HGBA1C in the last 168 hours Fasting Lipid Panel: No results found for this basename: CHOL,HDL,LDLCALC,TRIG,CHOLHDL,LDLDIRECT in the last 960 hours Thyroid Function Tests:  Lab 10/16/11 1544  TSH 1.929  T4TOTAL --  FREET4 --  T3FREE --  THYROIDAB --   Coagulation:  Lab 10/17/11 0449 10/16/11 0502 10/15/11 0454 10/14/11 1742  LABPROT 28.7* 25.7* 23.1* 24.4*  INR 2.88* 2.48* 2.15* 2.32*   Anemia Panel:  Lab 10/16/11 1544  VITAMINB12 288  FOLATE --  FERRITIN --  TIBC --  IRON --  RETICCTPCT --   Urine Drug Screen: Drugs of Abuse  No results found for this basename: labopia,  cocainscrnur,  labbenz,  amphetmu,  thcu,  labbarb    Alcohol Level: No results found for this basename: ETH:2 in the last 168 hours Urinalysis:  Lab 10/14/11 1915  COLORURINE YELLOW  LABSPEC 1.020  PHURINE 6.0  GLUCOSEU NEGATIVE  HGBUR NEGATIVE  BILIRUBINUR NEGATIVE  KETONESUR NEGATIVE  PROTEINUR NEGATIVE  UROBILINOGEN 0.2  NITRITE NEGATIVE  LEUKOCYTESUR  NEGATIVE   Micro Results: Recent Results (from the past 240 hour(s))  CLOSTRIDIUM DIFFICILE BY PCR     Status: Normal   Collection Time   10/15/11  2:30 AM      Component Value Range Status Comment   C difficile by pcr NEGATIVE  NEGATIVE Final    Studies/Results: No results found. Scheduled Meds:    . ciprofloxacin  500 mg Oral BID  . lactose free nutrition  237 mL Oral TID BM  . metroNIDAZOLE  500 mg Oral Q8H  . sodium chloride      . traZODone  50 mg Oral QHS  . warfarin  5 mg Oral ONCE-1800  . Warfarin - Pharmacist Dosing Inpatient   Does not apply q1800  . DISCONTD: ciprofloxacin  400 mg Intravenous Q12H  . DISCONTD: metronidazole  500 mg Intravenous Q8H   Continuous Infusions:    . 0.9 % NaCl  with KCl 40 mEq / L    . DISCONTD: 0.9 % NaCl with KCl 20 mEq / L 75 mL/hr at 10/16/11 0406   PRN Meds:.acetaminophen, acetaminophen, HYDROcodone-acetaminophen, DISCONTD:  morphine injection Assessment/Plan: Principal Problem:  *Colitis presumed infectious. Looks worse today. Will resume IV fluids. Encourage by mouth intake. Check BMET. Out of bed. Physical therapy consult. Order health shakes. Active Problems:  VOMITING, improved  VITAMIN B12 DEFICIENCY  FACTOR V DEFICIENCY  COLON CANCER, HX OF  DVT, HX OF  GREENFIELD FILTER INSERTION, HX OF  Pacemaker  Anticoagulant long-term use Paresthesias: Has history of B12 deficiency. Will check B12 level and RBC folate. TSH normal. Insomnia: Trazodone nightly.   LOS: 3 days   Neta Upadhyay L 10/17/2011, 9:01 AM

## 2011-10-17 NOTE — Evaluation (Signed)
Physical Therapy Evaluation Patient Details Name: Justin Lang MRN: [id number] DOB: August 08, 1922 Today's Date: 10/17/2011 Time: 4098-1191 PT Time Calculation (min): 54 min  PT Assessment / Plan / Recommendation Clinical Impression  Pt  c/o increasing weakness due to ongoing hospitalization and poor nutrition (lack of appetite).   While he is maintaining functional strength, he is slightly unstable when ambulating with a cane.  We have transitioned him into ambulating with a walker and he is quite stable with this.  We initiated ther ex for generalized LE strengthening and have provided a walker in the room for gait.  His wife would like to assist him to ambulate in the hallway and this would be quite alright.  We will keep him on our PT schedule to insure that pt maintains adequate strength to be able to return home at d/c.  He is not very interested in having HHPT at d/c.            PT Assessment  Patient needs continued PT services    Follow Up Recommendations  No PT follow up    Barriers to Discharge None      Equipment Recommendations  None recommended by PT    Recommendations for Other Services     Frequency Min 3X/week    Precautions / Restrictions Restrictions Weight Bearing Restrictions: No   Pertinent Vitals/Pain       Mobility  Bed Mobility Supine to Sit: 4: Min guard;HOB flat Sit to Supine: 5: Supervision;HOB flat Transfers Transfers: Sit to Stand;Stand to Sit Sit to Stand: 4: Min guard;From bed Stand to Sit: 6: Modified independent (Device/Increase time);To chair/3-in-1;To bed Ambulation/Gait Ambulation/Gait Assistance: 5: Supervision Ambulation Distance (Feet): 150 Feet (100' with ro.lling walker) Assistive device: Rolling walker;Straight cane Gait Pattern: Trunk flexed;Within Functional Limits Gait velocity: WNL General Gait Details: pt has an occasional "wobble" while ambulating with cane...we elected to instruct in gait with walker to ensure  stability Stairs: No Wheelchair Mobility Wheelchair Mobility: No    Shoulder Instructions     Exercises General Exercises - Lower Extremity Quad Sets: AROM;Both;10 reps;Supine Gluteal Sets: AROM;Both;10 reps;Supine Heel Slides: AROM;Both;10 reps;Supine Hip ABduction/ADduction: AROM;Both;10 reps;Supine Straight Leg Raises: AROM;Both;10 reps;Supine Other Exercises Other Exercises: bridging x 8 reps   PT Diagnosis: Difficulty walking;Generalized weakness  PT Problem List: Decreased strength;Decreased activity tolerance;Decreased mobility PT Treatment Interventions: Gait training;Functional mobility training;Therapeutic activities;Therapeutic exercise;Patient/family education   PT Goals Acute Rehab PT Goals PT Goal Formulation: With patient/family Pt will Ambulate: >150 feet;with modified independence;with least restrictive assistive device PT Goal: Ambulate - Progress: Goal set today Pt will Perform Home Exercise Program: Independently PT Goal: Perform Home Exercise Program - Progress: Goal set today  Visit Information  Last PT Received On: 10/17/11    Subjective Data  Subjective: I feel really weak... Patient Stated Goal: wants to be able to function at home   Prior Functioning       Cognition  Overall Cognitive Status: Appears within functional limits for tasks assessed/performed Arousal/Alertness: Awake/alert Orientation Level: Appears intact for tasks assessed Behavior During Session: [facility] for tasks performed    Extremity/Trunk Assessment Right Lower Extremity Assessment RLE ROM/Strength/Tone: [facility] for tasks assessed RLE Sensation: WFL - Light Touch;History of peripheral neuropathy RLE Coordination: WFL - gross motor Left Lower Extremity Assessment LLE ROM/Strength/Tone: WFL for tasks assessed LLE Sensation: WFL - Light Touch;History of peripheral neuropathy LLE Coordination: WFL - gross motor   Balance Balance Balance Assessed: No  End of Session PT - End of  Session  Equipment Utilized During Treatment: Gait belt Activity Tolerance: Patient tolerated treatment well Patient left: in chair;with call bell/phone within reach;with family/visitor present Nurse Communication: Mobility status  GP     Konrad Penta 10/17/2011, 10:57 AM

## 2011-10-17 NOTE — Progress Notes (Signed)
Subjective; Patient complains of left shoulder pain. His appetite is better. He is still has soreness under the right rib cage. He had 3 loose stools yesterday and he has had 3 today. There are small volume. He denies melena or rectal bleeding. He is eating more than 50% of each meal. He feels very weak. He was able to walk to the nursing station using his walker. He has been evaluated by PT. Objective; BP 145/68  Pulse 63  Temp 98.1 F (36.7 C) (Oral)  Resp 17  Ht 5\' 10"  (1.778 m)  Wt 162 lb 11.2 oz (73.8 kg)  BMI 23.34 kg/m2  SpO2 93% Patient is sitting in chair and appears to be comfortable. Abdominal exam reveals hyperactive bowel sounds. On palpation is soft. Percussion note tympanic at epigastrium. Assessment; Right-sided colitis felt to be an infection despite negative stool studies although final results on stool cultures not in. He appears to be gradually improving. As long as his recovery continues do not believe he needs further evaluation. Patient advised to take probiotic daily from month when he is discharged.

## 2011-10-18 LAB — BASIC METABOLIC PANEL
BUN: 15 mg/dL (ref 6–23)
CO2: 23 mEq/L (ref 19–32)
Chloride: 107 mEq/L (ref 96–112)
Creatinine, Ser: 1.4 mg/dL — ABNORMAL HIGH (ref 0.50–1.35)
Potassium: 4.7 mEq/L (ref 3.5–5.1)

## 2011-10-18 LAB — CBC
HCT: 37.8 % — ABNORMAL LOW (ref 39.0–52.0)
Hemoglobin: 12.4 g/dL — ABNORMAL LOW (ref 13.0–17.0)
MCHC: 32.8 g/dL (ref 30.0–36.0)
MCV: 89.8 fL (ref 78.0–100.0)
RDW: 15.1 % (ref 11.5–15.5)
WBC: 8.1 10*3/uL (ref 4.0–10.5)

## 2011-10-18 MED ORDER — ACETAMINOPHEN 325 MG PO TABS: 650.0000 mg | ORAL_TABLET | Freq: Four times a day (QID) | ORAL | Status: DC | PRN

## 2011-10-18 MED ORDER — WARFARIN SODIUM 7.5 MG PO TABS: 7.5000 mg | ORAL_TABLET | Freq: Every day | ORAL | Status: DC

## 2011-10-18 MED ORDER — CIPROFLOXACIN HCL 500 MG PO TABS: 500.0000 mg | ORAL_TABLET | Freq: Two times a day (BID) | ORAL | Status: DC

## 2011-10-18 MED ORDER — WARFARIN SODIUM 1 MG PO TABS: 1.0000 mg | ORAL_TABLET | Freq: Every day | ORAL | Status: DC

## 2011-10-18 MED ORDER — PROMETHAZINE HCL 12.5 MG PO TABS: 12.5000 mg | ORAL_TABLET | Freq: Four times a day (QID) | ORAL | Status: DC | PRN

## 2011-10-18 MED ORDER — METRONIDAZOLE 500 MG PO TABS: 500.0000 mg | ORAL_TABLET | Freq: Three times a day (TID) | ORAL | Status: DC

## 2011-10-18 MED ORDER — HYDROCODONE-ACETAMINOPHEN 5-325 MG PO TABS: 1.0000 | ORAL_TABLET | Freq: Four times a day (QID) | ORAL | Status: DC | PRN

## 2011-10-18 NOTE — Progress Notes (Signed)
ANTICOAGULATION CONSULT NOTE   Pharmacy Consult for Coumadin Indication: hx VTE  Allergies  Allergen Reactions  . Celecoxib Rash    CELEBREX   Patient Measurements: Height: 5\' 10"  (177.8 cm) Weight: 162 lb 11.2 oz (73.8 kg) IBW/kg (Calculated) : 73   Vital Signs: Temp: 97.1 F (36.2 C) (10/03 0529) Temp src: Oral (10/03 0529) BP: 184/98 mmHg (10/03 0529) Pulse Rate: 63  (10/03 0529)  Labs:  Basename 10/18/11 0451 10/17/11 0449 10/16/11 0502  HGB 12.4* -- 11.5*  HCT 37.8* -- 35.4*  PLT 246 -- 192  APTT -- -- --  LABPROT 33.6* 28.7* 25.7*  INR 3.57* 2.88* 2.48*  HEPARINUNFRC -- -- --  CREATININE 1.40* -- 1.62*  CKTOTAL -- -- --  CKMB -- -- --  TROPONINI -- -- --   Estimated Creatinine Clearance: 38.4 ml/min (by C-G formula based on Cr of 1.4).  Medical History: Past Medical History  Diagnosis Date  . Colon cancer     hx of metastic- treated with surgery and readiation  . DVT (deep venous thrombosis)     hx of s/p Greenfield filter  . OAB (overactive bladder)   . Arthritis   . Cataracts, bilateral   . FH: factor V Leiden deficiency   . History of nephrolithiasis   . History of ankle fracture   . 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  . Peripheral neuropathy   . Vitamin B12 deficiency   . Symptomatic bradycardia   . Pacemaker   . Sinoatrial node dysfunction   . Sinoatrial node dysfunction    Medications:  Scheduled:     . ciprofloxacin  500 mg Oral BID  . lactose free nutrition  237 mL Oral TID BM  . metroNIDAZOLE  500 mg Oral Q8H  . traZODone  50 mg Oral QHS  . warfarin  2.5 mg Oral ONCE-1800  . Warfarin - Pharmacist Dosing Inpatient   Does not apply q1800   Assessment: 78 yo M on chronic warfarin 8.5mg  daily for hx DVT/PE. He was admitted today for colitis and started on Cipro & Flagyl which can interact with warfarin to elevate INR.   INR supra-therapeutic today most likely due to interacting medications.  Goal of Therapy:    INR 2-3   Plan: NO coumadin today Daily INR for now  Valrie Hart A 10/18/2011,10:20 AM

## 2011-10-18 NOTE — Discharge Summary (Addendum)
Physician Discharge Summary  Patient ID: Justin Lang MRN: [id number] DOB/AGE: [birth year] 78 y.o.  Admit date: 10/14/2011 Discharge date: 10/18/2011  Discharge Diagnoses:  Principal Problem:  *Colitis presumed infectious Active Problems:  VOMITING  VITAMIN B12 DEFICIENCY  FACTOR V DEFICIENCY  COLON CANCER, HX OF  DVT, HX OF  GREENFIELD FILTER INSERTION, HX OF  Pacemaker  Anticoagulant long-term use  Paresthesia of foot     Medication List     As of 10/18/2011 10:45 AM    TAKE these medications         acetaminophen 325 MG tablet   Commonly known as: TYLENOL   Take 2 tablets (650 mg total) by mouth every 6 (six) hours as needed (or Fever >/= 101).      ciprofloxacin 500 MG tablet   Commonly known as: CIPRO   Take 1 tablet (500 mg total) by mouth 2 (two) times daily.      fluticasone 0.05 % cream   Commonly known as: CUTIVATE   Apply 1 application topically daily.      HYDROcodone-acetaminophen 5-325 MG per tablet   Commonly known as: NORCO/VICODIN   Take 1-2 tablets by mouth every 6 (six) hours as needed.      metroNIDAZOLE 500 MG tablet   Commonly known as: FLAGYL   Take 1 tablet (500 mg total) by mouth every 8 (eight) hours.      promethazine 12.5 MG tablet   Commonly known as: PHENERGAN   Take 1 tablet (12.5 mg total) by mouth every 6 (six) hours as needed for nausea.      warfarin 1 MG tablet   Commonly known as: COUMADIN   Take 1 tablet (1 mg total) by mouth daily. Hold for 2 days, then resume. Take with 7.5mg  to equal 8.5mg       warfarin 7.5 MG tablet   Commonly known as: COUMADIN   Take 1 tablet (7.5 mg total) by mouth daily. Hold for 2 days, then Take 7.5mg  (1 tablet ) along with 1 mg tablet for a total of 8.5 mg daily            Discharge Orders    Future Appointments: Provider: Department: Dept Phone: Center:   10/30/2011 9:10 AM [facility] [phone] None   11/06/2011 10:00 AM Randall An, MD [facility]  [phone] None   12/31/2011 10:00 AM [facility] [phone] [facility]     Future Orders Please Complete By Expires   Activity as tolerated - No restrictions           Disposition: 01-Home or Self Care  Discharged Condition: stable  Consults: Treatment Team:  Malissa Hippo, MD PT  Labs:    Sodium      134 136  137    Potassium      3.8 3.4  4.7     Chloride      99 103  107    CO2      27 25  23     BUN      18 15  15     Creatinine, Ser      1.58 1.62  1.40    Calcium      8.6 8.2  8.5    GFR calc non Af Amer      38 37  44    GFR calc Af Amer      44  43   51     Glucose, Bld  103 85  88    Alkaline Phosphatase      62       Albumin      2.8       Lipase      21       AST      13       ALT      6       Total Protein      6.3       Total Bilirubin      1.0        IRON /ANEMIA PROFILE    RBC Folate        706       OTHER CHEM    Vitamin B-12        288      CBC    WBC      13.0 10.6  8.1    RBC      4.32 3.95  4.21    Hemoglobin      12.8 11.5  12.4    HCT      38.9 35.4  37.8    MCV      90.0 89.6  89.8    MCH      29.6 29.1  29.5    MCHC      32.9 32.5  32.8    RDW      15.1 14.9  15.1    Platelets      221 192  246     DIFFERENTIAL    Neutrophils Relative      83       Lymphocytes Relative      6       Monocytes Relative      9       Eosinophils Relative      2       Basophils Relative      0       Neutro Abs      9.5       Lymphs Abs      0.7       Monocytes Absolute      1.0       Eosinophils Absolute      0.2       Basophils Absolute      0.0        PROTIME W/ INR    Prothrombin Time 25.4     23.1 25.7 28.7 33.6    INR 2.44     2.15 2.48 2.88 3.57     DIABETES    Glucose, Bld      103 85  88     THYROID    TSH        1.929      URINALYSIS    Color, Urine      YELLOW       APPearance      CLEAR       Specific Gravity, Urine      1.020       pH      6.0       Glucose, UA       NEGATIVE       Bilirubin Urine      NEGATIVE       Ketones, ur      NEGATIVE       Protein, ur      NEGATIVE       Urobilinogen, UA  0.2       Nitrite      NEGATIVE       Leukocytes, UA      NEGATIVE        Hgb urine dipstick      NEGATIVE        STOOL TESTS    C difficile by pcr      NEGATIVE   Stool heme positive  Stool culture to date is negative, final results pending.  Diagnostics:  Ct Abdomen Pelvis W Contrast  10/14/2011  *RADIOLOGY REPORT*  Clinical Data: Abdominal pain, nausea, vomiting diarrhea.  History colon cancer.  CT ABDOMEN AND PELVIS WITH CONTRAST  Technique:  Multidetector CT imaging of the abdomen and pelvis was performed following the standard protocol during bolus administration of intravenous contrast.  Contrast:  80 ml Omnipaque-300.  Comparison: CT abdomen and pelvis 06/01/2011.  Findings: Small hiatal hernia is identified.  Dependent atelectasis is seen in the lung bases.  Atherosclerotic vascular disease is noted.  No pleural or pericardial effusion.  There is an unchanged low attenuating lesion in the posterior right hepatic lobe measuring 1.8 cm in diameter, likely a cyst.  The liver is otherwise unremarkable.  The patient is status post cholecystectomy.  Mild intra and extrahepatic biliary ductal prominence is unchanged.  The adrenal glands and spleen appear normal.  There is atrophy of both kidneys, much worse on the left, which is stable in appearance. Small nonobstructing stone lower pole of the right kidney is unchanged.  Small stone in the left renal pelvis is also unchanged.  Urinary bladder stone is again identified.  The prostate gland is enlarged.  The pancreas is unremarkable.  There is marked wall thickening of the cecum with infiltration of surrounding fat which is new since the prior study.  Surgical anastomoses of bowel in the left abdomen are again identified. Diverticular disease is noted.  There is no focal fluid collection. No lymphadenopathy  is identified.  IVC filter is in place.  The patient is status post lower lumbar laminectomy.  No lytic or sclerotic lesion is identified.  IMPRESSION:  1.  Marked thickening of the walls of the cecum with infiltration of surrounding fat is most likely due to infectious or inflammatory colitis as the finding is new since the patient's study approximately 4 months ago.  If the patient is not up-to-date with colon cancer screening, screening is recommended after his acute episode has passed. 2.  No change in bilateral renal stones and urinary bladder stone. 3.  Status post cholecystectomy. 4.  Enlarged prostate gland.   Original Report Authenticated By: Bernadene Bell. Maricela Curet, M.D.     Procedures:  none  Full Code   Hospital Course: See H&P for complete admission details. The patient is an 78 year old white male who presented with right lower quadrant pain. She also had vomiting. He was having chills. He has a history of chronic diarrhea, but it was worse than usual. He has a history of colon cancer with resection and radiation. In the emergency room, he was afebrile and had normal vital signs. He was in no acute distress. He had a soft abdomen with right lower quadrant tenderness. White blood cell count was 11,500. CAT scan showed right-sided colitis. He had a colonoscopy last year.  Patient was admitted started on empiric antibiotics. C. difficile was negative. To date, culture is negative, however he is improved on empiric antibiotics and this is likely infectious. GI was consulted and agreed with management. Patient's pain  is improved. He is tolerating a solid diet. He still has diarrhea, but it is less frequent.   He also reports chronic parasthesias of the feet.  B12, TSH, folate are normal.  He has a history of factor V Leiden, and is on chronic anticoagulation. He was therapeutic on admission. INR is slightly high today and I've asked him to hold his Coumadin for 2 days. He is stable for discharge.  Total time on the day of discharge greater than 30 minutes.  Discharge Exam:  Blood pressure 184/98, pulse 63, temperature 97.1 F (36.2 C), temperature source Oral, resp. rate 18, height 5\' 10"  (1.778 m), weight 73.8 kg (162 lb 11.2 oz), SpO2 95.00%.  Abd soft, nontender, nondistended.  SignedChristiane Ha 10/18/2011, 10:45 AM

## 2011-10-18 NOTE — Progress Notes (Signed)
Pt and pt's wife provided with d/c instructions and prescriptions.  Pt and pt's wife verbalized understanding of d/c orders.  Pt stable at time of discharge.

## 2011-10-20 LAB — STOOL CULTURE

## 2011-10-30 ENCOUNTER — Telehealth (HOSPITAL_COMMUNITY): Payer: Self-pay | Admitting: *Deleted

## 2011-10-30 ENCOUNTER — Encounter (HOSPITAL_COMMUNITY): Payer: Medicare Other | Attending: Oncology

## 2011-10-30 ENCOUNTER — Other Ambulatory Visit (HOSPITAL_COMMUNITY): Payer: Self-pay | Admitting: Oncology

## 2011-10-30 DIAGNOSIS — R209 Unspecified disturbances of skin sensation: Secondary | ICD-10-CM | POA: Insufficient documentation

## 2011-10-30 DIAGNOSIS — D682 Hereditary deficiency of other clotting factors: Secondary | ICD-10-CM

## 2011-10-30 DIAGNOSIS — G609 Hereditary and idiopathic neuropathy, unspecified: Secondary | ICD-10-CM | POA: Insufficient documentation

## 2011-10-30 LAB — PROTIME-INR: INR: 5.53 (ref 0.00–1.49)

## 2011-10-30 MED ORDER — PHYTONADIONE 5 MG PO TABS: 10.0000 mg | ORAL_TABLET | Freq: Once | ORAL | Status: DC

## 2011-10-30 NOTE — Progress Notes (Signed)
Labs drawn today for pt 

## 2011-10-30 NOTE — Telephone Encounter (Signed)
CRITICAL VALUE ALERT Critical value received:  Inr= 5.53 Date of notification:  10/30/2011 Time of notification: 1029 Critical value read back:  yes Nurse who received alert:  tar MD notified (1st page):  [place] at 1029

## 2011-11-01 ENCOUNTER — Encounter (HOSPITAL_COMMUNITY): Payer: Medicare Other

## 2011-11-01 ENCOUNTER — Other Ambulatory Visit (HOSPITAL_COMMUNITY): Payer: Self-pay | Admitting: Oncology

## 2011-11-01 DIAGNOSIS — D682 Hereditary deficiency of other clotting factors: Secondary | ICD-10-CM

## 2011-11-01 LAB — PROTIME-INR: Prothrombin Time: 14.3 seconds (ref 11.6–15.2)

## 2011-11-01 NOTE — Progress Notes (Signed)
Labs drawn today for pt 

## 2011-11-06 ENCOUNTER — Encounter (HOSPITAL_BASED_OUTPATIENT_CLINIC_OR_DEPARTMENT_OTHER): Payer: Medicare Other | Admitting: Oncology

## 2011-11-06 ENCOUNTER — Encounter (HOSPITAL_BASED_OUTPATIENT_CLINIC_OR_DEPARTMENT_OTHER): Payer: Medicare Other

## 2011-11-06 ENCOUNTER — Other Ambulatory Visit (HOSPITAL_COMMUNITY): Payer: Self-pay | Admitting: Oncology

## 2011-11-06 VITALS — BP 153/67 | HR 58 | Resp 16 | Wt 157.8 lb

## 2011-11-06 DIAGNOSIS — D682 Hereditary deficiency of other clotting factors: Secondary | ICD-10-CM

## 2011-11-06 DIAGNOSIS — Z85038 Personal history of other malignant neoplasm of large intestine: Secondary | ICD-10-CM

## 2011-11-06 DIAGNOSIS — G629 Polyneuropathy, unspecified: Secondary | ICD-10-CM

## 2011-11-06 DIAGNOSIS — R609 Edema, unspecified: Secondary | ICD-10-CM

## 2011-11-06 DIAGNOSIS — I82409 Acute embolism and thrombosis of unspecified deep veins of unspecified lower extremity: Secondary | ICD-10-CM

## 2011-11-06 DIAGNOSIS — G609 Hereditary and idiopathic neuropathy, unspecified: Secondary | ICD-10-CM

## 2011-11-06 LAB — PROTIME-INR
INR: 1.42 (ref 0.00–1.49)
Prothrombin Time: 17 seconds — ABNORMAL HIGH (ref 11.6–15.2)

## 2011-11-06 NOTE — Progress Notes (Signed)
Problem # 1 worsening peripheral neuropathy now with numbness tingling from above the knees down to the feet. He has to use a cane to walk or hold onto the wall to make it to the bathroom during the night. She is a cane throughout the day. He has had a B12 level, folic acid level, and TSH level, recently checked in the hospital all which were normal. I will check a methylmalonic acid level and homocysteine level to be on the safe side. I think he needs to have a PET scan and make sure his cancer is not come back as a possible cause for this worsening peripheral neuropathy. Problem #2 history of recurrent metastatic colon cancer status post chemotherapy and radiation therapy preoperatively followed by surgery do on 12/10/2000 thus far without recurrence of we are aware of. 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 Problem #8 herpes zoster L5-S1 September 2009 Problem #9 bilateral knee replacements with chronic mild looks very edema  He is walking with a cane very very slowly. He states he cannot feel his feet on the ground well whatsoever. His numbness and tingling extends above the knees bilaterally.  Otherwise he is eating fairly well weight is down a few pounds compared to last year. He has no other symptoms on review of systems. His physical exam reveals no lymphadenopathy in any location. His lungs are clear to auscultation percussion. Heart does reveal no systolic murmur and without distinct S3 gallop. Pacemaker is in place in the left upper chest wall. Abdomen is soft nondistended nontender  without organomegaly without obvious masses. His legs are puffy at both ankles which is not new or different.Marland Kitchen He is alert and oriented. His blood counts have been excellent lately. We will check his INR next week since we have to dose reduce his warfarin level last week. Warfarin Was held for 2 days. I will get some blood work on him as mentioned above and also get PET scan and an appointment with Dr. Lesia Sago in neurology to make sure we are not missing anything.

## 2011-11-06 NOTE — Progress Notes (Signed)
Justin Lang presented for labwork. Labs per MD order drawn via Peripheral Line 23 gauge needle inserted in right AC Good blood return present. Procedure without incident.  Needle removed intact. Patient tolerated procedure well.

## 2011-11-06 NOTE — Patient Instructions (Addendum)
[facility]  Discharge Instructions  RECOMMENDATIONS MADE BY THE CONSULTANT AND ANY TEST RESULTS WILL BE SENT TO YOUR REFERRING DOCTOR.   EXAM FINDINGS BY MD TODAY AND SIGNS AND SYMPTOMS TO REPORT TO CLINIC OR PRIMARY MD: exam and discussion by MD.  Will make referral to Dr. Anne Hahn for your peripheral neuropathy and will also get a PET Scan to make sure your cancer has not recurred.  Continue your coumadin at your current dosage and we will check your PT/INR in 1 week.  MEDICATIONS PRESCRIBED: none    INSTRUCTIONS GIVEN AND DISCUSSED: Other :  Report changes in bowel habits, blood in your stool, etc.  SPECIAL INSTRUCTIONS/FOLLOW-UP: Lab work Needed in 1 week and Return to Clinic in 6 months to see MD.   I acknowledge that I have been informed and understand all the instructions given to me and received a copy. I do not have any more questions at this time, but understand that I may call the Specialty Clinic at [facility] at [phone] during business hours should I have any further questions or need assistance in obtaining follow-up care.    __________________________________________  _____________  __________ Signature of Patient or Authorized Representative            Date                   Time    __________________________________________ Nurse's Signature

## 2011-11-06 NOTE — Progress Notes (Signed)
Justin Lang's reason for visit today are for labs as scheduled per MD orders.  Venipuncture performed with a 23 gauge butterfly needle to R Antecubital.  Justin Lang tolerated venipuncture well and without incident; questions were answered and patient was discharged.

## 2011-11-07 ENCOUNTER — Other Ambulatory Visit (HOSPITAL_COMMUNITY): Payer: Self-pay | Admitting: Oncology

## 2011-11-07 DIAGNOSIS — R202 Paresthesia of skin: Secondary | ICD-10-CM

## 2011-11-07 LAB — HOMOCYSTEINE: Homocysteine: 18.3 umol/L — ABNORMAL HIGH (ref 4.0–15.4)

## 2011-11-12 ENCOUNTER — Telehealth (HOSPITAL_COMMUNITY): Payer: Self-pay | Admitting: *Deleted

## 2011-11-12 NOTE — Telephone Encounter (Signed)
Message left for [name] to call back re: b 12 shots. Dr. Mariel Sleet would like him to restart these and do for life.

## 2011-11-14 ENCOUNTER — Encounter (HOSPITAL_COMMUNITY): Payer: Self-pay

## 2011-11-14 ENCOUNTER — Encounter (HOSPITAL_COMMUNITY)
Admission: RE | Admit: 2011-11-14 | Discharge: 2011-11-14 | Disposition: A | Discharge status: Home / Self Care | Payer: Medicare Other | Source: Ambulatory Visit | Attending: Oncology | Admitting: Oncology

## 2011-11-14 DIAGNOSIS — K389 Disease of appendix, unspecified: Secondary | ICD-10-CM | POA: Insufficient documentation

## 2011-11-14 DIAGNOSIS — G629 Polyneuropathy, unspecified: Secondary | ICD-10-CM

## 2011-11-14 DIAGNOSIS — G609 Hereditary and idiopathic neuropathy, unspecified: Secondary | ICD-10-CM | POA: Insufficient documentation

## 2011-11-14 DIAGNOSIS — C189 Malignant neoplasm of colon, unspecified: Secondary | ICD-10-CM | POA: Insufficient documentation

## 2011-11-14 LAB — GLUCOSE, CAPILLARY: Glucose-Capillary: 98 mg/dL (ref 70–99)

## 2011-11-14 MED ORDER — FLUDEOXYGLUCOSE F - 18 (FDG) INJECTION
15.5000 | Freq: Once | INTRAVENOUS | Status: AC | PRN
  Administered 2011-11-14: 15.5 via INTRAVENOUS

## 2011-11-15 ENCOUNTER — Encounter (HOSPITAL_BASED_OUTPATIENT_CLINIC_OR_DEPARTMENT_OTHER): Payer: Medicare Other

## 2011-11-15 ENCOUNTER — Encounter (HOSPITAL_COMMUNITY): Payer: Medicare Other

## 2011-11-15 ENCOUNTER — Other Ambulatory Visit (HOSPITAL_COMMUNITY): Payer: Self-pay | Admitting: Oncology

## 2011-11-15 DIAGNOSIS — D682 Hereditary deficiency of other clotting factors: Secondary | ICD-10-CM

## 2011-11-15 DIAGNOSIS — E7210 Disorders of sulfur-bearing amino-acid metabolism, unspecified: Secondary | ICD-10-CM

## 2011-11-15 DIAGNOSIS — G609 Hereditary and idiopathic neuropathy, unspecified: Secondary | ICD-10-CM

## 2011-11-15 DIAGNOSIS — R202 Paresthesia of skin: Secondary | ICD-10-CM

## 2011-11-15 MED ORDER — CYANOCOBALAMIN 1000 MCG/ML IJ SOLN
INTRAMUSCULAR | Status: AC
  Filled 2011-11-15: qty 1

## 2011-11-15 MED ORDER — CYANOCOBALAMIN 1000 MCG/ML IJ SOLN
1000.0000 ug | Freq: Once | INTRAMUSCULAR | Status: AC
  Administered 2011-11-15: 1000 ug via SUBCUTANEOUS

## 2011-11-15 NOTE — Progress Notes (Signed)
Justin Lang's reason for visit today are for labs as scheduled per MD orders; patient reports his current coumadin dose is 7.5mg .  Venipuncture performed with a 23 gauge butterfly needle to R Antecubital.  Breck Coons tolerated venipuncture well and without incident; questions were answered and patient was discharged.

## 2011-11-15 NOTE — Progress Notes (Signed)
Justin Lang presents today for injection per the provider's orders.  Vit B12 administered administration without incident; see MAR for injection details.  Patient tolerated procedure well and without incident.  No questions or complaints noted at this time.

## 2011-11-16 ENCOUNTER — Encounter (HOSPITAL_COMMUNITY): Payer: Medicare Other | Attending: Oncology | Admitting: Oncology

## 2011-11-16 VITALS — BP 157/79 | HR 59 | Temp 97.3°F | Resp 20

## 2011-11-16 DIAGNOSIS — D682 Hereditary deficiency of other clotting factors: Secondary | ICD-10-CM | POA: Insufficient documentation

## 2011-11-16 DIAGNOSIS — Z7901 Long term (current) use of anticoagulants: Secondary | ICD-10-CM

## 2011-11-16 DIAGNOSIS — R209 Unspecified disturbances of skin sensation: Secondary | ICD-10-CM | POA: Insufficient documentation

## 2011-11-16 DIAGNOSIS — K389 Disease of appendix, unspecified: Secondary | ICD-10-CM

## 2011-11-16 DIAGNOSIS — Z86718 Personal history of other venous thrombosis and embolism: Secondary | ICD-10-CM | POA: Insufficient documentation

## 2011-11-16 DIAGNOSIS — K388 Other specified diseases of appendix: Secondary | ICD-10-CM

## 2011-11-16 DIAGNOSIS — Z85038 Personal history of other malignant neoplasm of large intestine: Secondary | ICD-10-CM

## 2011-11-16 NOTE — Patient Instructions (Signed)
[facility] Specialty Clinic  Discharge Instructions  RECOMMENDATIONS MADE BY THE CONSULTANT AND ANY TEST RESULTS WILL BE SENT TO YOUR REFERRING DOCTOR.   EXAM FINDINGS BY MD TODAY AND SIGNS AND SYMPTOMS TO REPORT TO CLINIC OR PRIMARY MD: Exam as discussed by Dr. Mariel Sleet.  SPECIAL INSTRUCTIONS/FOLLOW-UP: 1.  You have an appointment to see Dr. Lovell Sheehan on Thursday, November 7th @ 0930 to be seen. 2.  Your PT/INR check has been moved back to Friday @ 1010.  We can administer your B12 injection same day as well.  Please feel free to contact us in the meantime with questions or concerns.  I acknowledge that I have been informed and understand all the instructions given to me and received a copy. I do not have any more questions at this time, but understand that I may call the Specialty Clinic at [facility] at [phone] during business hours should I have any further questions or need assistance in obtaining follow-up care.    __________________________________________  _____________  __________ Signature of Patient or Authorized Representative            Date                   Time    __________________________________________ Nurse's Signature

## 2011-11-16 NOTE — Progress Notes (Signed)
Problem #1 abnormal appendix from either tumor, cancerous or low malignant potential tumor such as carcinoid, versus mucocele or infection/abscess Problem #2 worsening peripheral neuropathy with a history of borderline B12 levels 4 years ago, mildly elevated methylmalonic acid level at that time. His homocystine level is now elevated and we will place him on indefinite vitamin B 12 injections monthly. At some point I still want him to be evaluated by Dr. Anne Hahn to make sure we are not missing anything. I am concerned that the appendix if cancerous may be the cause of this worsening neuropathy. Problem #3 DVT and recurrent pulmonary emboli on multiple occasions on lifelong Coumadin with a negative hypercoagulable workup Problem #4 history of colon cancer diagnosed in 1997 followed by recurrence several years later treated with radiation therapy and chemotherapy followed by resection by Dr. Joselyn Glassman at [facility] Problem #5 frequency of urination with a bladder stone as well followed by his urologist Problem #6 diverticulitis September 2013 Problem #7 pacemaker insertion for heart disease Problem #8 herpes zoster L5-S1 in September 2009 Problem #9 bilateral knee replacements with mild edema Problem #10 alternating bouts of diarrhea and constipation since his abdominal surgical procedures. He is here today with his wife [name] to go over his PET scan results. He clearly now has a abnormality of his appendix which is no longer in any doubt. This is either tumor, benign or malignant, or something else but its activity on PET scan is quite impressive now. I have discussed his case and gone over his PET scan with Dr. Lovell Sheehan who will see him next Thursday morning for consultation. His prior colon cancer was on the left sigmoid colon .  He will of course need transition to Lovenox from the Coumadin. If he wants a second opinion after seeing Dr. Lovell Sheehan we are happy to arrange that.

## 2011-11-19 ENCOUNTER — Ambulatory Visit (HOSPITAL_COMMUNITY): Payer: Medicare Other | Admitting: Oncology

## 2011-11-22 ENCOUNTER — Encounter (HOSPITAL_COMMUNITY): Payer: Medicare Other

## 2011-11-22 ENCOUNTER — Other Ambulatory Visit (HOSPITAL_COMMUNITY): Payer: Medicare Other

## 2011-11-22 ENCOUNTER — Encounter (HOSPITAL_COMMUNITY): Payer: Self-pay | Admitting: Pharmacy Technician

## 2011-11-22 NOTE — Patient Instructions (Addendum)
20     20 Justin Lang  11/22/2011   Your procedure is scheduled on:  11/29/2011  Report to [facility] at   730  AM.  Call this number if you have problems the morning of surgery: [phone]   Remember:   Do not eat food:After Midnight.  May have clear liquids:until Midnight .  Clear liquids include soda, tea, black coffee, apple or grape juice, broth.  Take these medicines the morning of surgery with A SIP OF WATER:  norco,phenergan   Do not wear jewelry, make-up or nail polish.  Do not wear lotions, powders, or perfumes. You may wear deodorant.  Do not shave 48 hours prior to surgery. Men may shave face and neck.  Do not bring valuables to the hospital.  Contacts, dentures or bridgework may not be worn into surgery.  Leave suitcase in the car. After surgery it may be brought to your room.  For patients admitted to the hospital, checkout time is 11:00 AM the day of discharge.   Patients discharged the day of surgery will not be allowed to drive home.  Name and phone number of your driver: family  Special Instructions: Shower using CHG 2 nights before surgery and the night before surgery.  If you shower the day of surgery use CHG.  Use special wash - you have one bottle of CHG for all showers.  You should use approximately 1/3 of the bottle for each shower.   Please read over the following fact sheets that you were given: Pain Booklet, Coughing and Deep Breathing, MRSA Information, Surgical Site Infection Prevention, Anesthesia Post-op Instructions and Care and Recovery After Surgery Removal of Colon (Colectomy) Care Before and After Surgery A partial or total colectomy is the removal of part or all of your colon. This is most often done under a general anesthetic so that you are sleeping during the procedure. Following the procedure, you will be taken to a recovery room. Once you have recovered from the anesthetic you will be returned to your room.  LET YOUR CAREGIVERS KNOW  ABOUT:  Allergies  Medications taken including herbs, eye drops, over the counter medications, and creams.  Use of steroids (by mouth or creams).  Previous problems with anesthetics or Novocaine.  Possibility of pregnancy, if this applies.  History of blood clots (thrombophlebitis).  History of bleeding or blood problems.  Previous surgery.  Other health problems. AFTER THE PROCEDURE After surgery, you will be taken to the recovery area where a nurse will watch you and check your progress. After the recovery area you will go to your hospital room. Your surgeon will determine when it is alright for you to take fluids and foods. You will be given pain medicine to keep you comfortable. HOME CARE INSTRUCTIONS  Once home, an ice pack applied to the operative site may help with discomfort and keep swelling down.  Change dressings as directed.  Only take over-the-counter or prescription medicines for pain, discomfort, or fever as directed by your caregiver.  You may continue normal diet and activities as directed.  There should be no heavy lifting (more than 10 pounds), strenuous activities or contact sports for three weeks, or as directed.  Keep the wound dry and clean. The wound may be washed gently with soap and water. Gently blot or dab dry following cleansing without rubbing. Do not take baths, use swimming pools or use hot tubs for ten days, or as instructed by your caregivers.  If you have a colostomy, care for it as you have been shown. SEEK MEDICAL CARE IF:   There is redness, swelling, or increasing pain in the wound area.  Pus is coming from the wound.  An unexplained oral temperature above 101 F (38.3 C) develops.  You notice a foul smell coming from the wound or dressing.  There is a breaking open of a wound (edges not staying together) after the sutures have been removed.  There is increasing abdominal pain. SEEK IMMEDIATE MEDICAL CARE IF:   A rash  develops.  There is difficulty breathing, or development of a reaction or side effects to medications given. Document Released: 10/06/2003 Document Revised: 03/26/2011 Document Reviewed: 02/04/2007 [facility] Patient Information 2013 [place], [place]. PATIENT INSTRUCTIONS POST-ANESTHESIA  IMMEDIATELY FOLLOWING SURGERY:  Do not drive or operate machinery for the first twenty four hours after surgery.  Do not make any important decisions for twenty four hours after surgery or while taking narcotic pain medications or sedatives.  If you develop intractable nausea and vomiting or a severe headache please notify your doctor immediately.  FOLLOW-UP:  Please make an appointment with your surgeon as instructed. You do not need to follow up with anesthesia unless specifically instructed to do so.  WOUND CARE INSTRUCTIONS (if applicable):  Keep a dry clean dressing on the anesthesia/puncture wound site if there is drainage.  Once the wound has quit draining you may leave it open to air.  Generally you should leave the bandage intact for twenty four hours unless there is drainage.  If the epidural site drains for more than 36-48 hours please call the anesthesia department.  QUESTIONS?:  Please feel free to call your physician or the hospital operator if you have any questions, and they will be happy to assist you.

## 2011-11-22 NOTE — H&P (Signed)
MELODY SAVIDGE is an 78 y.o. male.   Chief Complaint: Appendiceal neoplasm HPI: Patient is a 78 year old white male who was found on recent CT scan of the abdomen to have an enlarging appendiceal mass. He does have a history of colon cancer, but this appears to be different. He has had some chronic loose stools for many years due to 2 is left-sided partial colectomy. He denies any fever or chills. He apparently has left-sided diverticulosis. He last had a colonoscopy in 2012 which was unremarkable.  Past Medical History  Diagnosis Date  . Colon cancer     hx of metastic- treated with surgery and readiation  . DVT (deep venous thrombosis)     hx of s/p Greenfield filter  . OAB (overactive bladder)   . Arthritis   . Cataracts, bilateral   . FH: factor V Leiden deficiency   . History of nephrolithiasis   . History of ankle fracture   . 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  . Peripheral neuropathy   . Vitamin B12 deficiency   . Symptomatic bradycardia   . Pacemaker   . Sinoatrial node dysfunction   . Sinoatrial node dysfunction     Past Surgical History  Procedure Date  . Colectomy     sigmoid secondary to colon ca- 1997  . Exploratory laparotomy     metastatic retroperitoneal lymph node- 2002  . Total knee arthroplasty     right-1991; left-1992  . Back surgeries     x3  . Filter replacement   . Left ankle surgery   . Incisional herniorraphy   . Pacemaker insertion   . Replacement total knee bilateral   . Colon resection   . Colonoscopy 08/18/2010    Procedure: COLONOSCOPY;  Surgeon: Malissa Hippo, MD;  Location: [facility];  Service: Endoscopy;  Laterality: N/A;  . Esophagogastroduodenoscopy 08/18/2010    Procedure: ESOPHAGOGASTRODUODENOSCOPY (EGD);  Surgeon: Malissa Hippo, MD;  Location: [facility];  Service: Endoscopy;  Laterality: N/A;    Family History  Problem Relation Age of Onset  . Anesthesia problems Neg Hx   . Hypotension Neg Hx   .  Malignant hyperthermia Neg Hx   . Pseudochol deficiency Neg Hx   . Cancer Sister    Social History:  reports that he has never smoked. He has never used smokeless tobacco. He reports that he does not drink alcohol or use illicit drugs.  Allergies:  Allergies  Allergen Reactions  . Celecoxib Rash    CELEBREX    No prescriptions prior to admission    No results found for this or any previous visit (from the past 48 hour(s)). No results found.  Review of Systems  Constitutional: Positive for malaise/fatigue.  HENT: Negative.   Eyes: Negative.   Respiratory: Negative.   Cardiovascular: Negative.   Gastrointestinal: Positive for abdominal pain.  Genitourinary: Negative.   Musculoskeletal: Positive for back pain and joint pain.  Skin: Negative.   Endo/Heme/Allergies: Bruises/bleeds easily.    There were no vitals taken for this visit. Physical Exam  Constitutional: He is oriented to person, place, and time. He appears well-developed and well-nourished.  HENT:  Head: Normocephalic and atraumatic.  Neck: Normal range of motion. Neck supple.  Cardiovascular: Normal rate, regular rhythm and normal heart sounds.   Respiratory: Effort normal and breath sounds normal.  GI: Soft. Bowel sounds are normal. He exhibits no mass. There is no tenderness. There is no rebound.  Neurological: He  is alert and oriented to person, place, and time.  Skin: Skin is warm and dry.     Assessment/Plan Impression: Appendiceal neoplasm of unknown behavior Plan: The patient scheduled for oh partial colectomy on 11/28/2011. The risks and benefits of the procedure including bleeding, infection, cardiopulmonary difficulties, the possibly of a blood transfusion were fully explained to the patient, gave informed consent. He'll be bridged with Lovenox as per Dr. Mariel Sleet.  Derren Suydam A 11/22/2011, 1:59 PM

## 2011-11-23 ENCOUNTER — Other Ambulatory Visit (HOSPITAL_COMMUNITY): Payer: Medicare Other

## 2011-11-23 ENCOUNTER — Encounter (HOSPITAL_COMMUNITY): Payer: Self-pay

## 2011-11-23 ENCOUNTER — Other Ambulatory Visit: Payer: Self-pay

## 2011-11-23 ENCOUNTER — Encounter (HOSPITAL_BASED_OUTPATIENT_CLINIC_OR_DEPARTMENT_OTHER): Payer: Medicare Other

## 2011-11-23 ENCOUNTER — Encounter (HOSPITAL_COMMUNITY)
Admission: RE | Admit: 2011-11-23 | Discharge: 2011-11-23 | Disposition: A | Discharge status: Home / Self Care | Payer: Medicare Other | Source: Ambulatory Visit | Attending: General Surgery | Admitting: General Surgery

## 2011-11-23 DIAGNOSIS — E538 Deficiency of other specified B group vitamins: Secondary | ICD-10-CM

## 2011-11-23 DIAGNOSIS — R202 Paresthesia of skin: Secondary | ICD-10-CM

## 2011-11-23 LAB — PREPARE RBC (CROSSMATCH)

## 2011-11-23 LAB — CBC WITH DIFFERENTIAL/PLATELET
Eosinophils Absolute: 0.5 10*3/uL (ref 0.0–0.7)
Hemoglobin: 13.8 g/dL (ref 13.0–17.0)
Lymphocytes Relative: 23 % (ref 12–46)
Lymphs Abs: 1.4 10*3/uL (ref 0.7–4.0)
MCH: 29.7 pg (ref 26.0–34.0)
Monocytes Relative: 9 % (ref 3–12)
Neutrophils Relative %: 59 % (ref 43–77)
Platelets: 224 10*3/uL (ref 150–400)
RBC: 4.65 MIL/uL (ref 4.22–5.81)
WBC: 6.1 10*3/uL (ref 4.0–10.5)

## 2011-11-23 LAB — BASIC METABOLIC PANEL
CO2: 28 mEq/L (ref 19–32)
Calcium: 8.8 mg/dL (ref 8.4–10.5)
GFR calc non Af Amer: 34 mL/min — ABNORMAL LOW (ref >90)
Glucose, Bld: 102 mg/dL — ABNORMAL HIGH (ref 70–99)
Potassium: 4.2 mEq/L (ref 3.5–5.1)
Sodium: 142 mEq/L (ref 135–145)

## 2011-11-23 LAB — SURGICAL PCR SCREEN: Staphylococcus aureus: POSITIVE — AB

## 2011-11-23 LAB — ABO/RH: ABO/RH(D): O POS

## 2011-11-23 LAB — PROTIME-INR
INR: 3.38 — ABNORMAL HIGH (ref 0.00–1.49)
Prothrombin Time: 32.3 seconds — ABNORMAL HIGH (ref 11.6–15.2)

## 2011-11-23 MED ORDER — CYANOCOBALAMIN 1000 MCG/ML IJ SOLN
1000.0000 ug | Freq: Once | INTRAMUSCULAR | Status: AC
  Administered 2011-11-23: 1000 ug via SUBCUTANEOUS

## 2011-11-23 MED ORDER — CYANOCOBALAMIN 1000 MCG/ML IJ SOLN
INTRAMUSCULAR | Status: AC
  Filled 2011-11-23: qty 1

## 2011-11-23 NOTE — Progress Notes (Signed)
Tolerated injection well. 

## 2011-11-23 NOTE — Pre-Procedure Instructions (Signed)
PT/INR results back from lab. 32.3/ 3.38,shown to Dr Jayme Cloud, will recheck pt/inr prior to surgery. Will notify Dr Lovell Sheehan also.

## 2011-11-23 NOTE — Pre-Procedure Instructions (Addendum)
Dr Lovell Sheehan aware of pt/inr. He spoke with specialty clincs- Dr Mariel Sleet concerning bridging of patient with lovenox. Will bring patient in early on surgery day for repeat pt/inr per Dr Lovell Sheehan. Patient notified to be here at 1000 on 11/28/2011 as well as to get rx for mupircion filled and start that treatment

## 2011-11-23 NOTE — Pre-Procedure Instructions (Addendum)
Dr Jayme Cloud aware of multiple medical problems,including pacemaker. Pacemaker information obtained from epic and shown to Dr Jayme Cloud. Ekg done today also shown to Dr Jayme Cloud. No new orders given.

## 2011-11-26 ENCOUNTER — Telehealth (HOSPITAL_COMMUNITY): Payer: Self-pay | Admitting: Oncology

## 2011-11-26 NOTE — Telephone Encounter (Signed)
Toran Murch Baade's wife, [name], called reporting that Justin Lang experienced BRB w/ BM x 3 days ago but none since.  [name] also states patient had a venipuncture for labs x 3 days ago, and he experienced transient bleeding from that site x 2 days ago but none now.  Dr. Mariel Sleet advised of same and advised patient is to decrease his lovenox injections to daily instead of twice daily and that he needs to stop one day prior to surgery - 11/27/11 is his last lovenox dose, as he is scheduled for surgery 11/28/11 @ 1130.  Patient's wife was also advised to contact us with further concerns or questions.

## 2011-11-28 ENCOUNTER — Encounter (HOSPITAL_COMMUNITY): Payer: Self-pay | Admitting: Anesthesiology

## 2011-11-28 ENCOUNTER — Encounter (HOSPITAL_COMMUNITY)
Admission: RE | Disposition: A | Discharge status: Home Health | Payer: Self-pay | Source: Ambulatory Visit | Attending: General Surgery

## 2011-11-28 ENCOUNTER — Inpatient Hospital Stay (HOSPITAL_COMMUNITY)
Admission: RE | Admit: 2011-11-28 | Discharge: 2011-12-05 | DRG: 330 | Disposition: A | Discharge status: Home Health | Payer: Medicare Other | Source: Ambulatory Visit | Attending: General Surgery | Admitting: General Surgery

## 2011-11-28 ENCOUNTER — Inpatient Hospital Stay (HOSPITAL_COMMUNITY): Payer: Medicare Other | Admitting: Anesthesiology

## 2011-11-28 ENCOUNTER — Encounter (HOSPITAL_COMMUNITY): Payer: Self-pay | Admitting: *Deleted

## 2011-11-28 DIAGNOSIS — K5669 Other intestinal obstruction: Secondary | ICD-10-CM | POA: Diagnosis present

## 2011-11-28 DIAGNOSIS — F0390 Unspecified dementia without behavioral disturbance: Secondary | ICD-10-CM | POA: Diagnosis present

## 2011-11-28 DIAGNOSIS — K5730 Diverticulosis of large intestine without perforation or abscess without bleeding: Secondary | ICD-10-CM | POA: Diagnosis present

## 2011-11-28 DIAGNOSIS — Z7901 Long term (current) use of anticoagulants: Secondary | ICD-10-CM

## 2011-11-28 DIAGNOSIS — E538 Deficiency of other specified B group vitamins: Secondary | ICD-10-CM | POA: Diagnosis present

## 2011-11-28 DIAGNOSIS — N318 Other neuromuscular dysfunction of bladder: Secondary | ICD-10-CM | POA: Diagnosis present

## 2011-11-28 DIAGNOSIS — Z9049 Acquired absence of other specified parts of digestive tract: Secondary | ICD-10-CM

## 2011-11-28 DIAGNOSIS — D649 Anemia, unspecified: Secondary | ICD-10-CM | POA: Diagnosis not present

## 2011-11-28 DIAGNOSIS — Z85038 Personal history of other malignant neoplasm of large intestine: Secondary | ICD-10-CM

## 2011-11-28 DIAGNOSIS — Z950 Presence of cardiac pacemaker: Secondary | ICD-10-CM

## 2011-11-28 DIAGNOSIS — N289 Disorder of kidney and ureter, unspecified: Secondary | ICD-10-CM | POA: Diagnosis present

## 2011-11-28 DIAGNOSIS — Z923 Personal history of irradiation: Secondary | ICD-10-CM

## 2011-11-28 DIAGNOSIS — M129 Arthropathy, unspecified: Secondary | ICD-10-CM | POA: Diagnosis present

## 2011-11-28 DIAGNOSIS — Z888 Allergy status to other drugs, medicaments and biological substances status: Secondary | ICD-10-CM

## 2011-11-28 DIAGNOSIS — G609 Hereditary and idiopathic neuropathy, unspecified: Secondary | ICD-10-CM | POA: Diagnosis present

## 2011-11-28 DIAGNOSIS — Z9221 Personal history of antineoplastic chemotherapy: Secondary | ICD-10-CM

## 2011-11-28 DIAGNOSIS — C180 Malignant neoplasm of cecum: Principal | ICD-10-CM | POA: Diagnosis present

## 2011-11-28 DIAGNOSIS — Z86718 Personal history of other venous thrombosis and embolism: Secondary | ICD-10-CM

## 2011-11-28 DIAGNOSIS — Z96659 Presence of unspecified artificial knee joint: Secondary | ICD-10-CM

## 2011-11-28 DIAGNOSIS — F29 Unspecified psychosis not due to a substance or known physiological condition: Secondary | ICD-10-CM | POA: Diagnosis not present

## 2011-11-28 DIAGNOSIS — D6859 Other primary thrombophilia: Secondary | ICD-10-CM | POA: Diagnosis present

## 2011-11-28 HISTORY — PX: LAPAROSCOPIC ILEOCECECTOMY: SHX5898

## 2011-11-28 HISTORY — PX: PARTIAL COLECTOMY: SHX5273

## 2011-11-28 LAB — PROTIME-INR
INR: 1.05 (ref 0.00–1.49)
Prothrombin Time: 13.6 seconds (ref 11.6–15.2)

## 2011-11-28 SURGERY — COLECTOMY, PARTIAL
Anesthesia: General | Site: Abdomen | Wound class: Clean Contaminated

## 2011-11-28 MED ORDER — SODIUM CHLORIDE 0.9 % IR SOLN
Status: DC | PRN
  Administered 2011-11-28 (×2): 1000 mL

## 2011-11-28 MED ORDER — LACTATED RINGERS IV SOLN
INTRAVENOUS | Status: DC
Start: 2011-11-28 — End: 2011-11-28
  Administered 2011-11-28: 16:00:00 via INTRAVENOUS
  Administered 2011-11-28: 1000 mL via INTRAVENOUS

## 2011-11-28 MED ORDER — EPHEDRINE SULFATE 50 MG/ML IJ SOLN
INTRAMUSCULAR | Status: AC
  Filled 2011-11-28: qty 1

## 2011-11-28 MED ORDER — LIDOCAINE HCL (PF) 1 % IJ SOLN
INTRAMUSCULAR | Status: AC
  Filled 2011-11-28: qty 5

## 2011-11-28 MED ORDER — ENOXAPARIN SODIUM 40 MG/0.4ML ~~LOC~~ SOLN
SUBCUTANEOUS | Status: AC
  Filled 2011-11-28: qty 0.4

## 2011-11-28 MED ORDER — LACTATED RINGERS IV SOLN
INTRAVENOUS | Status: DC
  Administered 2011-11-28: 1000 via INTRAVENOUS
  Administered 2011-11-28 – 2011-11-29 (×2): via INTRAVENOUS

## 2011-11-28 MED ORDER — ENOXAPARIN SODIUM 40 MG/0.4ML ~~LOC~~ SOLN
40.0000 mg | Freq: Once | SUBCUTANEOUS | Status: AC
  Administered 2011-11-28: 40 mg via SUBCUTANEOUS

## 2011-11-28 MED ORDER — NEOSTIGMINE METHYLSULFATE 1 MG/ML IJ SOLN
INTRAMUSCULAR | Status: DC | PRN
  Administered 2011-11-28: 3 mg via INTRAVENOUS

## 2011-11-28 MED ORDER — BUPIVACAINE HCL (PF) 0.5 % IJ SOLN
INTRAMUSCULAR | Status: AC
  Filled 2011-11-28: qty 30

## 2011-11-28 MED ORDER — BIOTENE DRY MOUTH MT LIQD
15.0000 mL | Freq: Two times a day (BID) | OROMUCOSAL | Status: DC
  Administered 2011-11-28 – 2011-12-05 (×12): 15 mL via OROMUCOSAL

## 2011-11-28 MED ORDER — SODIUM CHLORIDE 0.9 % IV SOLN: 1.0000 g | INTRAVENOUS | Status: DC

## 2011-11-28 MED ORDER — ROCURONIUM BROMIDE 50 MG/5ML IV SOLN
INTRAVENOUS | Status: AC
  Filled 2011-11-28: qty 1

## 2011-11-28 MED ORDER — CHLORHEXIDINE GLUCONATE 4 % EX LIQD: 1.0000 "application " | Freq: Once | CUTANEOUS | Status: DC

## 2011-11-28 MED ORDER — ROCURONIUM BROMIDE 100 MG/10ML IV SOLN
INTRAVENOUS | Status: DC | PRN
  Administered 2011-11-28: 50 mg via INTRAVENOUS

## 2011-11-28 MED ORDER — GLYCOPYRROLATE 0.2 MG/ML IJ SOLN
INTRAMUSCULAR | Status: AC
  Filled 2011-11-28: qty 1

## 2011-11-28 MED ORDER — MIDAZOLAM HCL 2 MG/2ML IJ SOLN
1.0000 mg | INTRAMUSCULAR | Status: DC | PRN
  Administered 2011-11-28: 2 mg via INTRAVENOUS

## 2011-11-28 MED ORDER — ETOMIDATE 2 MG/ML IV SOLN
INTRAVENOUS | Status: DC | PRN
  Administered 2011-11-28: 10 mg via INTRAVENOUS

## 2011-11-28 MED ORDER — HEMOSTATIC AGENTS (NO CHARGE) OPTIME
TOPICAL | Status: DC | PRN
  Administered 2011-11-28: 1 via TOPICAL

## 2011-11-28 MED ORDER — BUPIVACAINE HCL (PF) 0.5 % IJ SOLN
INTRAMUSCULAR | Status: DC | PRN
  Administered 2011-11-28: 10 mL

## 2011-11-28 MED ORDER — ONDANSETRON HCL 4 MG/2ML IJ SOLN
4.0000 mg | Freq: Once | INTRAMUSCULAR | Status: AC
  Administered 2011-11-28: 4 mg via INTRAVENOUS

## 2011-11-28 MED ORDER — POVIDONE-IODINE 10 % EX OINT
TOPICAL_OINTMENT | CUTANEOUS | Status: DC | PRN
  Administered 2011-11-28: 2 via TOPICAL

## 2011-11-28 MED ORDER — FENTANYL CITRATE 0.05 MG/ML IJ SOLN
INTRAMUSCULAR | Status: DC | PRN
  Administered 2011-11-28 (×2): 50 ug via INTRAVENOUS
  Administered 2011-11-28: 150 ug via INTRAVENOUS

## 2011-11-28 MED ORDER — ACETAMINOPHEN 10 MG/ML IV SOLN
INTRAVENOUS | Status: AC
  Filled 2011-11-28: qty 100

## 2011-11-28 MED ORDER — ALVIMOPAN 12 MG PO CAPS
ORAL_CAPSULE | ORAL | Status: AC
  Filled 2011-11-28: qty 1

## 2011-11-28 MED ORDER — FENTANYL CITRATE 0.05 MG/ML IJ SOLN
INTRAMUSCULAR | Status: AC
  Filled 2011-11-28: qty 5

## 2011-11-28 MED ORDER — GLYCOPYRROLATE 0.2 MG/ML IJ SOLN
0.2000 mg | Freq: Once | INTRAMUSCULAR | Status: AC
  Administered 2011-11-28: 0.2 mg via INTRAVENOUS

## 2011-11-28 MED ORDER — ONDANSETRON HCL 4 MG/2ML IJ SOLN
INTRAMUSCULAR | Status: AC
  Filled 2011-11-28: qty 2

## 2011-11-28 MED ORDER — MIDAZOLAM HCL 5 MG/5ML IJ SOLN
INTRAMUSCULAR | Status: DC | PRN
  Administered 2011-11-28: 1 mg via INTRAVENOUS

## 2011-11-28 MED ORDER — ALVIMOPAN 12 MG PO CAPS
12.0000 mg | ORAL_CAPSULE | Freq: Once | ORAL | Status: AC
  Administered 2011-11-28: 12 mg via ORAL

## 2011-11-28 MED ORDER — MIDAZOLAM HCL 2 MG/2ML IJ SOLN
INTRAMUSCULAR | Status: AC
  Filled 2011-11-28: qty 2

## 2011-11-28 MED ORDER — ONDANSETRON HCL 4 MG PO TABS: 4.0000 mg | ORAL_TABLET | Freq: Four times a day (QID) | ORAL | Status: DC | PRN

## 2011-11-28 MED ORDER — MORPHINE SULFATE 2 MG/ML IJ SOLN
2.0000 mg | INTRAMUSCULAR | Status: DC | PRN
  Administered 2011-11-28 – 2011-12-02 (×9): 2 mg via INTRAVENOUS
  Filled 2011-11-28 (×11): qty 1

## 2011-11-28 MED ORDER — ACETAMINOPHEN 10 MG/ML IV SOLN
1000.0000 mg | Freq: Four times a day (QID) | INTRAVENOUS | Status: AC
  Administered 2011-11-28 – 2011-11-29 (×4): 1000 mg via INTRAVENOUS
  Filled 2011-11-28 (×3): qty 100

## 2011-11-28 MED ORDER — ONDANSETRON HCL 4 MG/2ML IJ SOLN: 4.0000 mg | Freq: Once | INTRAMUSCULAR | Status: DC | PRN

## 2011-11-28 MED ORDER — SODIUM CHLORIDE 0.9 % IV SOLN
1.0000 g | INTRAVENOUS | Status: DC | PRN
  Administered 2011-11-28: 1 g via INTRAVENOUS

## 2011-11-28 MED ORDER — FENTANYL CITRATE 0.05 MG/ML IJ SOLN
25.0000 ug | INTRAMUSCULAR | Status: DC | PRN
  Administered 2011-11-28 (×2): 25 ug via INTRAVENOUS

## 2011-11-28 MED ORDER — EPHEDRINE SULFATE 50 MG/ML IJ SOLN
INTRAMUSCULAR | Status: DC | PRN
  Administered 2011-11-28: 15 mg via INTRAVENOUS

## 2011-11-28 MED ORDER — ETOMIDATE 2 MG/ML IV SOLN
INTRAVENOUS | Status: AC
  Filled 2011-11-28: qty 10

## 2011-11-28 MED ORDER — SODIUM CHLORIDE 0.9 % IV SOLN
INTRAVENOUS | Status: AC
  Filled 2011-11-28: qty 1

## 2011-11-28 MED ORDER — ONDANSETRON HCL 4 MG/2ML IJ SOLN
4.0000 mg | Freq: Four times a day (QID) | INTRAMUSCULAR | Status: DC | PRN
  Administered 2011-11-28 – 2011-11-30 (×2): 4 mg via INTRAVENOUS
  Filled 2011-11-28 (×2): qty 2

## 2011-11-28 MED ORDER — ALVIMOPAN 12 MG PO CAPS
12.0000 mg | ORAL_CAPSULE | Freq: Two times a day (BID) | ORAL | Status: DC
  Administered 2011-11-29 – 2011-12-01 (×5): 12 mg via ORAL
  Filled 2011-11-28 (×5): qty 1

## 2011-11-28 MED ORDER — FENTANYL CITRATE 0.05 MG/ML IJ SOLN
INTRAMUSCULAR | Status: AC
  Filled 2011-11-28: qty 2

## 2011-11-28 MED ORDER — GLYCOPYRROLATE 0.2 MG/ML IJ SOLN
INTRAMUSCULAR | Status: AC
  Filled 2011-11-28: qty 2

## 2011-11-28 MED ORDER — GLYCOPYRROLATE 0.2 MG/ML IJ SOLN
INTRAMUSCULAR | Status: DC | PRN
  Administered 2011-11-28: 0.4 mg via INTRAVENOUS

## 2011-11-28 SURGICAL SUPPLY — 73 items
APPLIER CLIP 11 MED OPEN (CLIP)
APPLIER CLIP 13 LRG OPEN (CLIP)
APR CLP LRG 13 20 CLIP (CLIP)
APR CLP MED 11 20 MLT OPN (CLIP)
BAG HAMPER (MISCELLANEOUS) ×2 IMPLANT
BARRIER SKIN 2 3/4 (OSTOMY) IMPLANT
BARRIER SKIN OD2.25 2 3/4 FLNG (OSTOMY) IMPLANT
BRR SKN FLT 2.75X2.25 2 PC (OSTOMY)
CELLS DAT CNTRL 66122 CELL SVR (MISCELLANEOUS) ×1 IMPLANT
CLAMP POUCH DRAINAGE QUIET (OSTOMY) IMPLANT
CLIP APPLIE 11 MED OPEN (CLIP) IMPLANT
CLIP APPLIE 13 LRG OPEN (CLIP) IMPLANT
CLOTH BEACON ORANGE TIMEOUT ST (SAFETY) ×2 IMPLANT
COVER LIGHT HANDLE STERIS (MISCELLANEOUS) ×4 IMPLANT
DRAPE EXTREMITY T 121X128X90 (DRAPE) ×1 IMPLANT
DRAPE WARM FLUID 44X44 (DRAPE) ×2 IMPLANT
DURAPREP 26ML APPLICATOR (WOUND CARE) ×2 IMPLANT
ELECT BLADE 6 FLAT ULTRCLN (ELECTRODE) ×1 IMPLANT
ELECT REM PT RETURN 9FT ADLT (ELECTROSURGICAL) ×2
ELECTRODE REM PT RTRN 9FT ADLT (ELECTROSURGICAL) ×1 IMPLANT
GLOVE BIO SURGEON STRL SZ7.5 (GLOVE) ×4 IMPLANT
GLOVE BIOGEL PI IND STRL 7.0 (GLOVE) IMPLANT
GLOVE BIOGEL PI IND STRL 7.5 (GLOVE) IMPLANT
GLOVE BIOGEL PI INDICATOR 7.0 (GLOVE) ×1
GLOVE BIOGEL PI INDICATOR 7.5 (GLOVE) ×1
GLOVE ECLIPSE 6.5 STRL STRAW (GLOVE) ×2 IMPLANT
GLOVE ECLIPSE 7.0 STRL STRAW (GLOVE) ×2 IMPLANT
GOWN STRL REIN XL XLG (GOWN DISPOSABLE) ×6 IMPLANT
HEMOSTAT SURGICEL 4X8 (HEMOSTASIS) ×1 IMPLANT
INST SET MAJOR GENERAL (KITS) ×2 IMPLANT
KIT ROOM TURNOVER APOR (KITS) ×2 IMPLANT
LIGASURE IMPACT 36 18CM CVD LR (INSTRUMENTS) ×2 IMPLANT
MANIFOLD NEPTUNE II (INSTRUMENTS) ×2 IMPLANT
NDL HYPO 25X1 1.5 SAFETY (NEEDLE) IMPLANT
NEEDLE HYPO 25X1 1.5 SAFETY (NEEDLE) ×2 IMPLANT
NS IRRIG 1000ML POUR BTL (IV SOLUTION) ×4 IMPLANT
PACK ABDOMINAL MAJOR (CUSTOM PROCEDURE TRAY) ×2 IMPLANT
PAD ARMBOARD 7.5X6 YLW CONV (MISCELLANEOUS) ×2 IMPLANT
POUCH OSTOMY 2 3/4  H 3804 (WOUND CARE)
POUCH OSTOMY 2 3/4 H 3804 (WOUND CARE)
POUCH OSTOMY 2 PC DRNBL 2.25 (WOUND CARE) IMPLANT
POUCH OSTOMY 2 PC DRNBL 2.75 (WOUND CARE) IMPLANT
POUCH OSTOMY DRNBL 2 1/4 (WOUND CARE)
RELOAD LINEAR CUT PROX 55 BLUE (ENDOMECHANICALS) IMPLANT
RELOAD PROXIMATE 75MM BLUE (ENDOMECHANICALS) ×4 IMPLANT
RELOAD STAPLE 55 3.8 BLU REG (ENDOMECHANICALS) IMPLANT
RELOAD STAPLE 75 3.8 BLU REG (ENDOMECHANICALS) IMPLANT
RETRACTOR WND ALEXIS 18 MED (MISCELLANEOUS) IMPLANT
RETRACTOR WND ALEXIS 25 LRG (MISCELLANEOUS) IMPLANT
RTRCTR WOUND ALEXIS 18CM MED (MISCELLANEOUS) ×2
RTRCTR WOUND ALEXIS 25CM LRG (MISCELLANEOUS)
SET BASIN LINEN APH (SET/KITS/TRAYS/PACK) ×2 IMPLANT
SPONGE GAUZE 4X4 12PLY (GAUZE/BANDAGES/DRESSINGS) ×2 IMPLANT
SPONGE LAP 18X18 X RAY DECT (DISPOSABLE) ×2 IMPLANT
SPONGE SURGIFOAM ABS GEL 100 (HEMOSTASIS) ×1 IMPLANT
STAPLER GUN LINEAR PROX 60 (STAPLE) ×1 IMPLANT
STAPLER PROXIMATE 55 BLUE (STAPLE) IMPLANT
STAPLER PROXIMATE 75MM BLUE (STAPLE) ×1 IMPLANT
STAPLER VISISTAT (STAPLE) ×1 IMPLANT
SUCTION POOLE TIP (SUCTIONS) ×2 IMPLANT
SUT CHROMIC 0 SH (SUTURE) ×1 IMPLANT
SUT CHROMIC 2 0 SH (SUTURE) ×1 IMPLANT
SUT CHROMIC 3 0 SH 27 (SUTURE) ×1 IMPLANT
SUT NOVA NAB GS-26 0 60 (SUTURE) IMPLANT
SUT PDS AB 0 CTX 60 (SUTURE) ×1 IMPLANT
SUT PDS AB CT VIOLET #0 27IN (SUTURE) ×3 IMPLANT
SUT SILK 2 0 (SUTURE)
SUT SILK 2 0 REEL (SUTURE) IMPLANT
SUT SILK 2-0 18XBRD TIE 12 (SUTURE) IMPLANT
SUT SILK 3 0 SH CR/8 (SUTURE) ×3 IMPLANT
TAPE CLOTH SOFT 2X10 (GAUZE/BANDAGES/DRESSINGS) ×1 IMPLANT
TOWEL OR 17X26 4PK STRL BLUE (TOWEL DISPOSABLE) ×2 IMPLANT
TRAY FOLEY CATH 14FR (SET/KITS/TRAYS/PACK) ×2 IMPLANT

## 2011-11-28 NOTE — Interval H&P Note (Signed)
History and Physical Interval Note:  11/28/2011 1:42 PM  Justin Lang  has presented today for surgery, with the diagnosis of Neoplasm Intestines  The various methods of treatment have been discussed with the patient and family. After consideration of risks, benefits and other options for treatment, the patient has consented to  Procedure(s) (LRB) with comments: PARTIAL COLECTOMY (N/A) as a surgical intervention .  The patient's history has been reviewed, patient examined, no change in status, stable for surgery.  I have reviewed the patient's chart and labs.  Questions were answered to the patient's satisfaction.     Franky Macho A

## 2011-11-28 NOTE — Op Note (Signed)
Patient:  Justin Lang  DOB:  [birth year]  MRN:  [id number]   Preop Diagnosis:  Intestinal neoplasm, appendix  Postop Diagnosis:  Same  Procedure:  Ileocecectomy  Surgeon:  Franky Macho, M.D.  Anes:  General endotracheal  Indications:  Patient is an 78 year old white male with a history of colon cancer  who presents with an abnormal a swollen appendix. It is thought that this may be an additional neoplasm of the appendix. The patient comes the operating room for a limited right hemicolectomy. The risks and benefits of the procedure including bleeding, infection, cardiopulmonary difficulties, possibly a stroke, possible blood transfusion, and the possibility of a neoplasm being found were fully explained to the patient, gave informed consent.  Procedure note:  The patient was placed in the supine position. After induction of general endotracheal anesthesia, the abdomen was prepped and draped using usual sterile technique with DuraPrep. Surgical site confirmation was performed.  A right paramedian incision was made. The peritoneal cavity was entered into without difficulty. Minimal adhesions were noted along the right side the abdomen. There were multiple adhesions on the left side the abdomen to the retroperitoneum as well as the previously placed ventral mesh. The right colon was mobilized along the peritoneal reflection. The ureter was identified. The appendix was noted to extend down into the pelvis. It was. Hearing to the right pelvic brim. The terminal ileum and the appendix were freed away both sharply and bluntly from the right pelvic rim. A GIA 75 stapler was placed across the terminal ileum and fired. This was likewise done across the mid ascending colon. The mesentery was then divided using the LigaSure. The specimen was sent to pathology further examination. The ileocolic anastomosis was then performed using a GIA 75 stapler. The enterotomy was closed using a TA 60 stapler. The stapler  was posterior using 3-0 silk sutures. The bowel was returned into the abdominal cavity an orderly fashion. The right lower corner was copious irrigated normal saline. All operating personnel changed their gloves. Surgicel and Gelfoam within the along the right paracolic gutter. The posterior fascia was closed using an 0 PDS running suture. The anterior fascial layer was reapproximated using 0 PDS running suture. The subcutaneous layer was irrigated normal saline and the skin was closed using staples. 0.5% Sensorcaine was instilled the surrounding wound. Betadine ointment dry sterile dressing were applied.  All tape and needle counts were correct at the end of the procedure. The patient was extubated in the operating room and transferred to PACU in stable condition.  Complications:  None  EBL:  125 cc  Specimen:  Ascending colon

## 2011-11-28 NOTE — Anesthesia Procedure Notes (Signed)
Procedure Name: Intubation Date/Time: 11/28/2011 1:59 PM Performed by: Carolyne Littles, Mehmet Scally L Pre-anesthesia Checklist: Emergency Drugs available, Suction available, Patient identified and Patient being monitored Patient Re-evaluated:Patient Re-evaluated prior to inductionOxygen Delivery Method: Circle system utilized Preoxygenation: Pre-oxygenation with 100% oxygen Intubation Type: IV induction Ventilation: Mask ventilation without difficulty Laryngoscope Size: Mac and 3 Grade View: Grade I Tube type: Oral Tube size: 8.0 mm Number of attempts: 1 Airway Equipment and Method: Stylet Placement Confirmation: ETT inserted through vocal cords under direct vision,  positive ETCO2 and breath sounds checked- equal and bilateral Secured at: 22 cm Tube secured with: Tape Dental Injury: Teeth and Oropharynx as per pre-operative assessment

## 2011-11-28 NOTE — Transfer of Care (Signed)
Immediate Anesthesia Transfer of Care Note  Patient: Justin Lang  Procedure(s) Performed: Procedure(s) (LRB) with comments: PARTIAL COLECTOMY (N/A)  Patient Location: PACU  Anesthesia Type:General  Level of Consciousness: awake and patient cooperative  Airway & Oxygen Therapy: Patient Spontanous Breathing and Patient connected to face mask oxygen  Post-op Assessment: Report given to PACU RN, Post -op Vital signs reviewed and stable and Patient moving all extremities  Post vital signs: Reviewed and stable  Complications: No apparent anesthesia complications

## 2011-11-28 NOTE — Anesthesia Postprocedure Evaluation (Signed)
  Anesthesia Post-op Note  Patient: Justin Lang  Procedure(s) Performed: Procedure(s) (LRB) with comments: PARTIAL COLECTOMY (N/A)  Patient Location: PACU  Anesthesia Type:General  Level of Consciousness: awake, alert  and patient cooperative  Airway and Oxygen Therapy: Patient Spontanous Breathing  Post-op Pain: 3 /10, mild  Post-op Assessment: Post-op Vital signs reviewed, Patient's Cardiovascular Status Stable, Respiratory Function Stable, Patent Airway, No signs of Nausea or vomiting and Pain level controlled  Post-op Vital Signs: Reviewed and stable  Complications: No apparent anesthesia complications

## 2011-11-28 NOTE — Anesthesia Preprocedure Evaluation (Signed)
Anesthesia Evaluation  Patient identified by MRN, date of birth, ID band Patient awake    Reviewed: Allergy & Precautions, H&P , NPO status   Airway Mallampati: I TM Distance: >3 FB     Dental  (+) Teeth Intact   Pulmonary  breath sounds clear to auscultation        Cardiovascular DVT (IVC filter) + dysrhythmias (SA node Dx w/ bradycardiua & syncope) + pacemaker Rhythm:Irregular Rate:Normal     Neuro/Psych  Headaches,  Neuromuscular disease    GI/Hepatic   Endo/Other    Renal/GU Renal InsufficiencyRenal disease     Musculoskeletal   Abdominal   Peds  Hematology   Anesthesia Other Findings   Reproductive/Obstetrics                           Anesthesia Physical Anesthesia Plan  ASA: III  Anesthesia Plan: General   Post-op Pain Management:    Induction: Intravenous, Rapid sequence and Cricoid pressure planned  Airway Management Planned: Oral ETT  Additional Equipment:   Intra-op Plan:   Post-operative Plan: Extubation in OR  Informed Consent: I have reviewed the patients History and Physical, chart, labs and discussed the procedure including the risks, benefits and alternatives for the proposed anesthesia with the patient or authorized representative who has indicated his/her understanding and acceptance.     Plan Discussed with:   Anesthesia Plan Comments: (Amidate induction.)        Anesthesia Quick Evaluation

## 2011-11-29 ENCOUNTER — Encounter (HOSPITAL_COMMUNITY): Payer: Medicare Other

## 2011-11-29 LAB — MAGNESIUM: Magnesium: 1.6 mg/dL (ref 1.5–2.5)

## 2011-11-29 LAB — TYPE AND SCREEN
ABO/RH(D): O POS
Unit division: 0

## 2011-11-29 LAB — CBC
MCH: 30.2 pg (ref 26.0–34.0)
MCHC: 32.8 g/dL (ref 30.0–36.0)
MCV: 92 fL (ref 78.0–100.0)
Platelets: 191 10*3/uL (ref 150–400)
RDW: 14.8 % (ref 11.5–15.5)

## 2011-11-29 LAB — BASIC METABOLIC PANEL
BUN: 17 mg/dL (ref 6–23)
Creatinine, Ser: 1.35 mg/dL (ref 0.50–1.35)
GFR calc Af Amer: 53 mL/min — ABNORMAL LOW (ref >90)
GFR calc non Af Amer: 46 mL/min — ABNORMAL LOW (ref >90)

## 2011-11-29 LAB — CK TOTAL AND CKMB (NOT AT ARMC)
CK, MB: 2 ng/mL (ref 0.3–4.0)
Total CK: 89 U/L (ref 7–232)

## 2011-11-29 MED ORDER — KCL IN DEXTROSE-NACL 20-5-0.45 MEQ/L-%-% IV SOLN
INTRAVENOUS | Status: DC
  Administered 2011-11-29 – 2011-11-30 (×3): via INTRAVENOUS
  Administered 2011-11-30: 1000 mL via INTRAVENOUS
  Administered 2011-12-01 – 2011-12-02 (×4): via INTRAVENOUS
  Administered 2011-12-03: 20 mL/h via INTRAVENOUS

## 2011-11-29 MED ORDER — HYDROCODONE-ACETAMINOPHEN 5-325 MG PO TABS
1.0000 | ORAL_TABLET | ORAL | Status: DC | PRN
  Administered 2011-11-30 – 2011-12-03 (×3): 1 via ORAL
  Filled 2011-11-29 (×3): qty 1

## 2011-11-29 MED ORDER — ENOXAPARIN SODIUM 80 MG/0.8ML ~~LOC~~ SOLN
80.0000 mg | SUBCUTANEOUS | Status: DC
  Administered 2011-11-29 – 2011-11-30 (×2): 80 mg via SUBCUTANEOUS
  Filled 2011-11-29 (×3): qty 0.8

## 2011-11-29 MED ORDER — MORPHINE SULFATE 2 MG/ML IJ SOLN
2.0000 mg | Freq: Once | INTRAMUSCULAR | Status: AC
  Administered 2011-11-29: 2 mg via INTRAVENOUS

## 2011-11-29 NOTE — Addendum Note (Signed)
Addendum  created 11/29/11 4098 by Despina Hidden, CRNA   Modules edited:Charges VN

## 2011-11-29 NOTE — Anesthesia Postprocedure Evaluation (Signed)
  Anesthesia Post-op Note  Patient: Justin Lang  Procedure(s) Performed: Procedure(s) (LRB) with comments: PARTIAL COLECTOMY (N/A)  Patient Location: room ICU 12  Anesthesia Type:General  Level of Consciousness: awake, alert , oriented and patient cooperative  Airway and Oxygen Therapy: Patient Spontanous Breathing and Patient connected to nasal cannula oxygen  Post-op Pain: 3 /10, mild  Post-op Assessment: Post-op Vital signs reviewed, Patient's Cardiovascular Status Stable, Respiratory Function Stable, Patent Airway, No signs of Nausea or vomiting and Pain level controlled  Post-op Vital Signs: Reviewed and stable  Complications: No apparent anesthesia complications

## 2011-11-29 NOTE — Progress Notes (Signed)
1 Day Post-Op  Subjective: Mild incisional pain. Otherwise feeling well.  Objective: Vital signs in last 24 hours: Temp:  [95.9 F (35.5 C)-98.1 F (36.7 C)] 98.1 F (36.7 C) (11/14 0736) Pulse Rate:  [45-74] 69  (11/14 0800) Resp:  [10-37] 17  (11/14 0800) BP: (135-179)/(49-101) 173/85 mmHg (11/14 0800) SpO2:  [73 %-100 %] 98 % (11/14 0800) Weight:  [68.5 kg (151 lb 0.2 oz)-76.5 kg (168 lb 10.4 oz)] 76.5 kg (168 lb 10.4 oz) (11/14 0600)    Intake/Output from previous day: 11/13 0701 - 11/14 0700 In: 3097.9 [I.V.:2797.9; IV Piggyback:300] Out: 805 [Urine:680; Blood:125] Intake/Output this shift:    General appearance: alert, cooperative and no distress Resp: clear to auscultation bilaterally Cardio: Regular rate and rhythm with occasional PACs. GI: Soft. Dressing dry and intact. No bowel sounds appreciated.  Lab Results:   [facility] 11/29/11 0509  WBC 8.7  HGB 12.4*  HCT 37.8*  PLT 191   BMET  Basename 11/29/11 0509  NA 140  K 4.2  CL 105  CO2 24  GLUCOSE 102*  BUN 17  CREATININE 1.35  CALCIUM 8.7   PT/INR  Basename 11/28/11 1200  LABPROT 13.6  INR 1.05    Studies/Results: No results found.  Anti-infectives: Anti-infectives     Start     Dose/Rate Route Frequency Ordered Stop   11/29/11 0600   ertapenem (INVANZ) 1 g in sodium chloride 0.9 % 50 mL IVPB  Status:  Discontinued        1 g 100 mL/hr over 30 Minutes Intravenous On call to O.R. 11/28/11 1240 11/28/11 1703          Assessment/Plan: s/p Procedure(s): PARTIAL COLECTOMY Impression: Stable, status post ileocecectomy. Vital signs appear stable. Plan: Will restart Lovenox as preop. Will hold off on restarting Coumadin at this time. Will adjust IV fluids. Continue to monitor in the step down unit.  LOS: 1 day    Sharnee Douglass A 11/29/2011

## 2011-11-29 NOTE — Addendum Note (Signed)
Addendum  created 11/29/11 0804 by Despina Hidden, CRNA   Modules edited:Notes Section

## 2011-11-29 NOTE — Progress Notes (Signed)
Patient OOB to [facility] with stand and pivot assist. Pt c/o dizziness when trying to have a bowel movement. Pt placed in recliner chair, then began to c/o chest pain at 1510. Pain 9 (scale 1-10). BP 162/61 HR 63 RR 20, nonlabored. Morphine 2mg  given iv, O2 per [place] increased to 2l. Dr Lovell Sheehan notified by phone. EKG obtained, showed 1 degree AVB. Cardiac enzymes drawn, and WNL. Dr Lovell Sheehan called with results. Pt continued to c/o pain. Returned pt to bed and gave 1 time dose of morphine as ordered.

## 2011-11-29 NOTE — Progress Notes (Signed)
UR Chart Review Completed  

## 2011-11-29 NOTE — Progress Notes (Signed)
Patient states that his pain "is not bad." states he does not need pain medicine at this time. Last dose of morphine given at 0932. Patient encouraged to ask for pain med before pain gets out of control, and he expressed understanding. Visiting with company at this time.

## 2011-11-30 ENCOUNTER — Encounter (HOSPITAL_COMMUNITY): Payer: Self-pay | Admitting: General Surgery

## 2011-11-30 LAB — CBC
MCH: 29.8 pg (ref 26.0–34.0)
MCHC: 33 g/dL (ref 30.0–36.0)
Platelets: 205 10*3/uL (ref 150–400)
RBC: 3.89 MIL/uL — ABNORMAL LOW (ref 4.22–5.81)

## 2011-11-30 LAB — MAGNESIUM: Magnesium: 1.5 mg/dL (ref 1.5–2.5)

## 2011-11-30 LAB — PHOSPHORUS: Phosphorus: 1.1 mg/dL — ABNORMAL LOW (ref 2.3–4.6)

## 2011-11-30 LAB — BASIC METABOLIC PANEL
Calcium: 8.2 mg/dL — ABNORMAL LOW (ref 8.4–10.5)
GFR calc non Af Amer: 51 mL/min — ABNORMAL LOW (ref >90)
Sodium: 133 mEq/L — ABNORMAL LOW (ref 135–145)

## 2011-11-30 MED ORDER — POTASSIUM PHOSPHATE DIBASIC 3 MMOLE/ML IV SOLN
25.0000 mmol | Freq: Once | INTRAVENOUS | Status: AC
  Administered 2011-11-30: 25 mmol via INTRAVENOUS
  Filled 2011-11-30: qty 8.33

## 2011-11-30 MED ORDER — ENOXAPARIN SODIUM 80 MG/0.8ML ~~LOC~~ SOLN
80.0000 mg | Freq: Two times a day (BID) | SUBCUTANEOUS | Status: DC
  Administered 2011-11-30 – 2011-12-03 (×6): 80 mg via SUBCUTANEOUS
  Filled 2011-11-30 (×7): qty 0.8

## 2011-11-30 MED ORDER — WARFARIN SODIUM 7.5 MG PO TABS
7.5000 mg | ORAL_TABLET | Freq: Once | ORAL | Status: AC
  Administered 2011-11-30: 7.5 mg via ORAL
  Filled 2011-11-30: qty 1

## 2011-11-30 MED ORDER — WARFARIN - PHARMACIST DOSING INPATIENT: Freq: Every day | Status: DC

## 2011-11-30 NOTE — Progress Notes (Signed)
2 Days Post-Op  Subjective: Is having episodes of sundowning, though he is alert and oriented this morning. He complained yesterday afternoon of chest pain, though cardiac workup negative. He is passing flatus.  Objective: Vital signs in last 24 hours: Temp:  [97.6 F (36.4 C)-98.8 F (37.1 C)] 98.1 F (36.7 C) (11/15 0421) Pulse Rate:  [63-78] 71  (11/15 0800) Resp:  [17-26] 26  (11/15 0800) BP: (152-176)/(56-72) 162/56 mmHg (11/15 0800) SpO2:  [89 %-98 %] 92 % (11/15 0800) Weight:  [76.114 kg (167 lb 12.8 oz)] 76.114 kg (167 lb 12.8 oz) (11/15 0421)    Intake/Output from previous day: 11/14 0701 - 11/15 0700 In: 1628.8 [P.O.:240; I.V.:1388.8] Out: 650 [Urine:650] Intake/Output this shift:    General appearance: alert, cooperative, appears stated age and no distress Resp: clear to auscultation bilaterally Cardio: Regular rate with occasional PACs. No S3, S4, murmurs appreciated. GI: Soft, flat. Occasional bowel sounds appreciated. Incision healing well.  Lab Results:   [facility] 11/30/11 0522 11/29/11 0509  WBC 10.4 8.7  HGB 11.6* 12.4*  HCT 35.2* 37.8*  PLT 205 191   BMET  Basename 11/30/11 0522 11/29/11 0509  NA 133* 140  K 3.5 4.2  CL 101 105  CO2 25 24  GLUCOSE 135* 102*  BUN 12 17  CREATININE 1.22 1.35  CALCIUM 8.2* 8.7   PT/INR  Basename 11/28/11 1200  LABPROT 13.6  INR 1.05    Studies/Results: No results found.  Anti-infectives: Anti-infectives     Start     Dose/Rate Route Frequency Ordered Stop   11/29/11 0600   ertapenem (INVANZ) 1 g in sodium chloride 0.9 % 50 mL IVPB  Status:  Discontinued        1 g 100 mL/hr over 30 Minutes Intravenous On call to O.R. 11/28/11 1240 11/28/11 1703          Assessment/Plan: s/p Procedure(s): PARTIAL COLECTOMY Impression: Progressing as expected. Hypophosphatemia will be addressed. We'll advance to full liquid diet. Final pathology pending. I explained to the patient's wife about sundowning. Will  continue to monitor this.  LOS: 2 days    Colleen Donahoe A 11/30/2011

## 2011-11-30 NOTE — Progress Notes (Signed)
MEDICATION RELATED CONSULT NOTE - INITIAL   Pharmacy Consult for Phosphorus replacement Indication: hypophosphatemia  Allergies  Allergen Reactions  . Celecoxib Rash    CELEBREX    Patient Measurements: Height: 5\' 9"  (175.3 cm) Weight: 167 lb 12.8 oz (76.114 kg) IBW/kg (Calculated) : 70.7   Vital Signs: Temp: 98.1 F (36.7 C) (11/15 0421) Temp src: Oral (11/15 0421) BP: 161/64 mmHg (11/15 0400) Pulse Rate: 78  (11/15 0400) Intake/Output from previous day: 11/14 0701 - 11/15 0700 In: 1628.8 [P.O.:240; I.V.:1388.8] Out: 650 [Urine:650] Intake/Output from this shift:    Labs:  [facility] 11/30/11 0522 11/29/11 0509  WBC 10.4 8.7  HGB 11.6* 12.4*  HCT 35.2* 37.8*  PLT 205 191  APTT -- --  CREATININE 1.22 1.35  LABCREA -- --  CREATININE 1.22 1.35  CREAT24HRUR -- --  MG 1.5 1.6  PHOS 1.1* 3.7  ALBUMIN -- --  PROT -- --  ALBUMIN -- --  AST -- --  ALT -- --  ALKPHOS -- --  BILITOT -- --  BILIDIR -- --  IBILI -- --   Estimated Creatinine Clearance: 42.7 ml/min (by C-G formula based on Cr of 1.22).   Microbiology: Recent Results (from the past 720 hour(s))  SURGICAL PCR SCREEN     Status: Abnormal   Collection Time   11/23/11  8:30 AM      Component Value Range Status Comment   MRSA, PCR NEGATIVE  NEGATIVE Final    Staphylococcus aureus POSITIVE (*) NEGATIVE Final     Medical History: Past Medical History  Diagnosis Date  . Colon cancer     hx of metastic- treated with surgery and readiation  . DVT (deep venous thrombosis)     hx of s/p Greenfield filter  . OAB (overactive bladder)   . Arthritis   . Cataracts, bilateral   . FH: factor V Leiden deficiency   . History of nephrolithiasis   . History of ankle fracture   . 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  . Peripheral neuropathy   . Vitamin B12 deficiency   . Symptomatic bradycardia   . Sinoatrial node dysfunction   . Sinoatrial node dysfunction   . Pacemaker 4 yrs ago     Medications:  Scheduled:    . [COMPLETED] acetaminophen  1,000 mg Intravenous Q6H  . alvimopan  12 mg Oral BID  . antiseptic oral rinse  15 mL Mouth Rinse BID  . enoxaparin (LOVENOX) injection  80 mg Subcutaneous Q24H  . [COMPLETED]  morphine injection  2 mg Intravenous Once  . potassium phosphate IVPB (mmol)  25 mmol Intravenous Once    Assessment: 78 yo M s/p colectomy on 11/28/11.   Phosphorus levels is low today. Potassium level is at low end of normal range. Sodium level is also low.   Goal of Therapy:  Phosphorus level 2.6-4.6  Plan:  1) KPhos IV x1  2) F/U BMET and Phos level in am  Justin Lang 11/30/2011,7:44 AM

## 2011-11-30 NOTE — Progress Notes (Signed)
ANTICOAGULATION CONSULT NOTE - Initial Consult  Pharmacy Consult for Coumadin Indication: history of DVT  Allergies  Allergen Reactions  . Celecoxib Rash    CELEBREX    Patient Measurements: Height: 5\' 9"  (175.3 cm) Weight: 167 lb 12.8 oz (76.114 kg) IBW/kg (Calculated) : 70.7   Vital Signs: Temp: 98.1 F (36.7 C) (11/15 0421) Temp src: Oral (11/15 0421) BP: 162/56 mmHg (11/15 0800) Pulse Rate: 71  (11/15 0800)  Labs:  Basename 11/30/11 0522 11/29/11 1545 11/29/11 0509 11/28/11 1200  HGB 11.6* -- 12.4* --  HCT 35.2* -- 37.8* --  PLT 205 -- 191 --  APTT -- -- -- --  LABPROT -- -- -- 13.6  INR -- -- -- 1.05  HEPARINUNFRC -- -- -- --  CREATININE 1.22 -- 1.35 --  CKTOTAL -- 89 -- --  CKMB -- 2.0 -- --  TROPONINI -- <0.30 -- --    Estimated Creatinine Clearance: 42.7 ml/min (by C-G formula based on Cr of 1.22).   Medical History: Past Medical History  Diagnosis Date  . Colon cancer     hx of metastic- treated with surgery and readiation  . DVT (deep venous thrombosis)     hx of s/p Greenfield filter  . OAB (overactive bladder)   . Arthritis   . Cataracts, bilateral   . FH: factor V Leiden deficiency   . History of nephrolithiasis   . History of ankle fracture   . 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  . Peripheral neuropathy   . Vitamin B12 deficiency   . Symptomatic bradycardia   . Sinoatrial node dysfunction   . Sinoatrial node dysfunction   . Pacemaker 4 yrs ago    Medications:  Scheduled:    . [COMPLETED] acetaminophen  1,000 mg Intravenous Q6H  . alvimopan  12 mg Oral BID  . antiseptic oral rinse  15 mL Mouth Rinse BID  . enoxaparin (LOVENOX) injection  80 mg Subcutaneous Q24H  . [COMPLETED]  morphine injection  2 mg Intravenous Once  . potassium phosphate IVPB (mmol)  25 mmol Intravenous Once  . warfarin  7.5 mg Oral ONCE-1800  . Warfarin - Pharmacist Dosing Inpatient   Does not apply q1800    Assessment: 78 yo M POD#2 who  is on chronic warfarin 7.5mg  daily for hx DVT.  Coumadin has been on hold since Nov 21, 2011 for colectomy.   Currently he is on full-dose Lovenox to bridge.  Plan to resume warfarin today.   Goal of Therapy:  INR 2-3 Monitor platelets by anticoagulation protocol: Yes   Plan:  1) Coumadin 7.5mg  po x1 today 2) Daily PT/INR 3) Lovenox per MD 4) Monitor CBC per protocol  Naveena Eyman, [facility] 11/30/2011,10:10 AM

## 2011-11-30 NOTE — Care Management Note (Signed)
    Page 1 of 2   12/05/2011     10:55:02 AM   CARE MANAGEMENT NOTE 12/05/2011  Patient:  DOMINYK, LAW   Account Number:  [id number]  Date Initiated:  11/30/2011  Documentation initiated by:  Sharrie Rothman  Subjective/Objective Assessment:   Pt admitted from home s/p partial colectomy. Pt lives with his wife and will return home at discharge. Pt has walker, BSC, lift chair, and handicap accessible bathrooms and BSC.     Action/Plan:   Will continue to follow for any HH needs.   Anticipated DC Date:  12/03/2011   Anticipated DC Plan:  HOME W HOME HEALTH SERVICES      DC Planning Services  CM consult      [facility]  HOME HEALTH   Choice offered to / List presented to:  C-3 Spouse        HH arranged  HH-2 PT      [facility] agency  [facility]   Status of service:  Completed, signed off Medicare Important Message given?  YES (If response is "NO", the following Medicare IM given date fields will be blank) Date Medicare IM given:  12/05/2011 Date Additional Medicare IM given:    Discharge Disposition:  HOME W HOME HEALTH SERVICES  Per UR Regulation:    If discussed at Long Length of Stay Meetings, dates discussed:   12/04/2011    Comments:  12/05/11 1050 Arlyss Queen, RN BSN CM Pt discharged home today. Pt's PT arranged with [facility]. Alroy Bailiff of [facility] is aware and will collect the information from the chart. Pt has walker so no other DME needs noted. Pt and pts nurse aware of discharge arrangements.  12/04/11 1350 Arlyss Queen, RN BSN CM Pt wife chose [facility] for PT at discharge. Discharge potential for 12/05/11. Alroy Bailiff of [facility] is aware and will collect the pts information from the chart. No DME needs noted.  11/30/11 1445 Arlyss Queen, RN BSN CM

## 2011-12-01 LAB — CBC
HCT: 34.9 % — ABNORMAL LOW (ref 39.0–52.0)
MCHC: 33.8 g/dL (ref 30.0–36.0)
RDW: 14.4 % (ref 11.5–15.5)

## 2011-12-01 LAB — PHOSPHORUS: Phosphorus: 2.1 mg/dL — ABNORMAL LOW (ref 2.3–4.6)

## 2011-12-01 LAB — BASIC METABOLIC PANEL
Calcium: 8.5 mg/dL (ref 8.4–10.5)
Creatinine, Ser: 1.08 mg/dL (ref 0.50–1.35)
GFR calc Af Amer: 69 mL/min — ABNORMAL LOW (ref >90)

## 2011-12-01 LAB — PROTIME-INR
INR: >10 (ref 0.00–1.49)
Prothrombin Time: >90 seconds — ABNORMAL HIGH (ref 11.6–15.2)

## 2011-12-01 MED ORDER — WARFARIN SODIUM 7.5 MG PO TABS
7.5000 mg | ORAL_TABLET | Freq: Once | ORAL | Status: AC
  Administered 2011-12-01: 7.5 mg via ORAL
  Filled 2011-12-01: qty 1

## 2011-12-01 MED ORDER — HALOPERIDOL LACTATE 5 MG/ML IJ SOLN
5.0000 mg | Freq: Once | INTRAMUSCULAR | Status: AC
  Administered 2011-12-01: 5 mg via INTRAVENOUS

## 2011-12-01 MED ORDER — ENSURE PUDDING PO PUDG
1.0000 | Freq: Three times a day (TID) | ORAL | Status: DC
  Administered 2011-12-01 – 2011-12-05 (×6): 1 via ORAL

## 2011-12-01 MED ORDER — HALOPERIDOL LACTATE 5 MG/ML IJ SOLN
INTRAMUSCULAR | Status: AC
  Administered 2011-12-01: 5 mg via INTRAVENOUS
  Filled 2011-12-01: qty 1

## 2011-12-01 MED ORDER — WARFARIN - PHARMACIST DOSING INPATIENT: Freq: Every day | Status: DC

## 2011-12-01 NOTE — Progress Notes (Signed)
MEDICATION RELATED CONSULT NOTE  Pharmacy Consult for Phosphorus replacement Indication: hypophosphatemia  Allergies  Allergen Reactions  . Celecoxib Rash    CELEBREX    Patient Measurements: Height: 5\' 9"  (175.3 cm) Weight: 168 lb 14 oz (76.6 kg) IBW/kg (Calculated) : 70.7   Vital Signs: Temp: 98.4 F (36.9 C) (11/16 0800) Temp src: Oral (11/16 0800) BP: 174/120 mmHg (11/16 0400) Pulse Rate: 84  (11/16 0750) Intake/Output from previous day: 11/15 0701 - 11/16 0700 In: 3497.3 [P.O.:1260; I.V.:1725; IV Piggyback:512.3] Out: 1000 [Urine:1000] Intake/Output from this shift:    Labs:  [facility] 12/01/11 0509 11/30/11 0522 11/29/11 0509  WBC 11.7* 10.4 8.7  HGB 11.8* 11.6* 12.4*  HCT 34.9* 35.2* 37.8*  PLT 220 205 191  APTT -- -- --  CREATININE 1.08 1.22 1.35  LABCREA -- -- --  CREATININE 1.08 1.22 1.35  CREAT24HRUR -- -- --  MG -- 1.5 1.6  PHOS 2.1* 1.1* 3.7  ALBUMIN -- -- --  PROT -- -- --  ALBUMIN -- -- --  AST -- -- --  ALT -- -- --  ALKPHOS -- -- --  BILITOT -- -- --  BILIDIR -- -- --  IBILI -- -- --   Estimated Creatinine Clearance: 48.2 ml/min (by C-G formula based on Cr of 1.08).   Microbiology: Recent Results (from the past 720 hour(s))  SURGICAL PCR SCREEN     Status: Abnormal   Collection Time   11/23/11  8:30 AM      Component Value Range Status Comment   MRSA, PCR NEGATIVE  NEGATIVE Final    Staphylococcus aureus POSITIVE (*) NEGATIVE Final     Medical History: Past Medical History  Diagnosis Date  . Colon cancer     hx of metastic- treated with surgery and readiation  . DVT (deep venous thrombosis)     hx of s/p Greenfield filter  . OAB (overactive bladder)   . Arthritis   . Cataracts, bilateral   . FH: factor V Leiden deficiency   . History of nephrolithiasis   . History of ankle fracture   . 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  . Peripheral neuropathy   . Vitamin B12 deficiency   . Symptomatic bradycardia     . Sinoatrial node dysfunction   . Sinoatrial node dysfunction   . Pacemaker 4 yrs ago    Medications:  Scheduled:     . alvimopan  12 mg Oral BID  . antiseptic oral rinse  15 mL Mouth Rinse BID  . enoxaparin (LOVENOX) injection  80 mg Subcutaneous Q12H  . [COMPLETED] potassium phosphate IVPB (mmol)  25 mmol Intravenous Once  . [COMPLETED] warfarin  7.5 mg Oral ONCE-1800  . warfarin  7.5 mg Oral ONCE-1800  . Warfarin - Pharmacist Dosing Inpatient   Does not apply q1800  . [DISCONTINUED] enoxaparin (LOVENOX) injection  80 mg Subcutaneous Q24H  . [DISCONTINUED] Warfarin - Pharmacist Dosing Inpatient   Does not apply q1800    Assessment: 78 yo M s/p colectomy on 11/28/11.   Phosphorus level is much improved after IV phosphorus replacement but remains just below low end of normal.   Potassium level is at low end of normal range. Sodium level is also low.   Goal of Therapy:  Phosphorus level 2.6-4.6  Plan:  1) F/U BMET and Phos level in am - supplement with additional IV phosphorus dose tomorrow if phosphorus does not continue to improve.   Elson Clan 12/01/2011,8:58 AM

## 2011-12-01 NOTE — Progress Notes (Signed)
ANTICOAGULATION CONSULT NOTE - Initial Consult  Pharmacy Consult for Coumadin Indication: history of DVT  Allergies  Allergen Reactions  . Celecoxib Rash    CELEBREX    Patient Measurements: Height: 5\' 9"  (175.3 cm) Weight: 168 lb 14 oz (76.6 kg) IBW/kg (Calculated) : 70.7   Vital Signs: Temp: 98.4 F (36.9 C) (11/16 0800) Temp src: Oral (11/16 0800) BP: 174/120 mmHg (11/16 0400) Pulse Rate: 84  (11/16 0750)  Labs:  Basename 12/01/11 0700 12/01/11 0509 11/30/11 0522 11/29/11 1545 11/29/11 0509 11/28/11 1200  HGB -- 11.8* 11.6* -- -- --  HCT -- 34.9* 35.2* -- 37.8* --  PLT -- 220 205 -- 191 --  APTT -- -- -- -- -- --  LABPROT 13.7 >90.0* -- -- -- 13.6  INR 1.06 >10.00* -- -- -- 1.05  HEPARINUNFRC -- -- -- -- -- --  CREATININE -- 1.08 1.22 -- 1.35 --  CKTOTAL -- -- -- 89 -- --  CKMB -- -- -- 2.0 -- --  TROPONINI -- -- -- <0.30 -- --    Estimated Creatinine Clearance: 48.2 ml/min (by C-G formula based on Cr of 1.08).   Medical History: Past Medical History  Diagnosis Date  . Colon cancer     hx of metastic- treated with surgery and readiation  . DVT (deep venous thrombosis)     hx of s/p Greenfield filter  . OAB (overactive bladder)   . Arthritis   . Cataracts, bilateral   . FH: factor V Leiden deficiency   . History of nephrolithiasis   . History of ankle fracture   . 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  . Peripheral neuropathy   . Vitamin B12 deficiency   . Symptomatic bradycardia   . Sinoatrial node dysfunction   . Sinoatrial node dysfunction   . Pacemaker 4 yrs ago    Medications:  Scheduled:     . alvimopan  12 mg Oral BID  . antiseptic oral rinse  15 mL Mouth Rinse BID  . enoxaparin (LOVENOX) injection  80 mg Subcutaneous Q12H  . [COMPLETED] potassium phosphate IVPB (mmol)  25 mmol Intravenous Once  . [COMPLETED] warfarin  7.5 mg Oral ONCE-1800  . warfarin  7.5 mg Oral ONCE-1800  . Warfarin - Pharmacist Dosing Inpatient    Does not apply q1800  . [DISCONTINUED] enoxaparin (LOVENOX) injection  80 mg Subcutaneous Q24H  . [DISCONTINUED] Warfarin - Pharmacist Dosing Inpatient   Does not apply q1800    Assessment: 78 yo M POD#3 who is on chronic warfarin 7.5mg  daily for hx DVT.  Coumadin was on hold for colectomy from Nov 6-Nov 15.   Currently he is on full-dose Lovenox to bridge.   INR returning to goal. No bleeding noted.   Goal of Therapy:  INR 2-3 Monitor platelets by anticoagulation protocol: Yes   Plan:  1) Coumadin 7.5mg  po x1 today 2) Daily PT/INR 3) Lovenox per MD 4) Monitor CBC per protocol  Elson Clan 12/01/2011,8:56 AM

## 2011-12-01 NOTE — Progress Notes (Signed)
3 Days Post-Op  Subjective: Still some confusion. Confusion was worse last night but has improved somewhat today. Pain is controlled. Some appetite but still with some nausea. Passing some flatus. No bowel movement.  Objective: Vital signs in last 24 hours: Temp:  [98 F (36.7 C)-99 F (37.2 C)] 98.4 F (36.9 C) (11/16 0800) Pulse Rate:  [48-103] 103  (11/16 0800) Resp:  [18-28] 21  (11/16 0900) BP: (157-191)/(63-120) 176/63 mmHg (11/16 0800) SpO2:  [91 %-100 %] 94 % (11/16 0800) Weight:  [76.6 kg (168 lb 14 oz)] 76.6 kg (168 lb 14 oz) (11/16 0500) Last BM Date:  (unknown)  Intake/Output from previous day: 11/15 0701 - 11/16 0700 In: 3572.3 [P.O.:1260; I.V.:1800; IV Piggyback:512.3] Out: 1000 [Urine:1000] Intake/Output this shift: Total I/O In: 630 [P.O.:480; I.V.:150] Out: 300 [Urine:300]  General appearance: alert and no distress GI: Intermittent bowel sounds, soft, flat, moderate expected postoperative tenderness. Incision is clean dry and intact. Staples are present. No peritoneal signs  Lab Results:   [facility] 12/01/11 0509 11/30/11 0522  WBC 11.7* 10.4  HGB 11.8* 11.6*  HCT 34.9* 35.2*  PLT 220 205   BMET  Basename 12/01/11 0509 11/30/11 0522  NA 132* 133*  K 3.5 3.5  CL 97 101  CO2 26 25  GLUCOSE 121* 135*  BUN 10 12  CREATININE 1.08 1.22  CALCIUM 8.5 8.2*   PT/INR  Basename 12/01/11 0700 12/01/11 0509  LABPROT 13.7 >90.0*  INR 1.06 >10.00*   ABG No results found for this basename: PHART:2,PCO2:2,PO2:2,HCO3:2 in the last 72 hours  Studies/Results: No results found.  Anti-infectives: Anti-infectives     Start     Dose/Rate Route Frequency Ordered Stop   11/29/11 0600   ertapenem (INVANZ) 1 g in sodium chloride 0.9 % 50 mL IVPB  Status:  Discontinued        1 g 100 mL/hr over 30 Minutes Intravenous On call to O.R. 11/28/11 1240 11/28/11 1703          Assessment/Plan: s/p Procedure(s) (LRB) with comments: PARTIAL COLECTOMY (N/A) Awaiting  bowel function increase. Continue on full liquids for now. Will add Ensure to his meals. Increase activity. Minimize narcotics  LOS: 3 days    Morgana Rowley C 12/01/2011

## 2011-12-02 LAB — CBC WITH DIFFERENTIAL/PLATELET
Basophils Absolute: 0 10*3/uL (ref 0.0–0.1)
Basophils Relative: 0 % (ref 0–1)
Eosinophils Relative: 9 % — ABNORMAL HIGH (ref 0–5)
HCT: 35.3 % — ABNORMAL LOW (ref 39.0–52.0)
MCHC: 33.7 g/dL (ref 30.0–36.0)
MCV: 88.5 fL (ref 78.0–100.0)
Monocytes Absolute: 0.7 10*3/uL (ref 0.1–1.0)
Platelets: 220 10*3/uL (ref 150–400)
RDW: 14.4 % (ref 11.5–15.5)

## 2011-12-02 LAB — BASIC METABOLIC PANEL
CO2: 25 mEq/L (ref 19–32)
Calcium: 8.6 mg/dL (ref 8.4–10.5)
Chloride: 101 mEq/L (ref 96–112)
Creatinine, Ser: 1.05 mg/dL (ref 0.50–1.35)
Creatinine, Ser: 1.16 mg/dL (ref 0.50–1.35)
GFR calc Af Amer: 72 mL/min — ABNORMAL LOW (ref >90)
GFR calc non Af Amer: 62 mL/min — ABNORMAL LOW (ref >90)
Potassium: 3.7 mEq/L (ref 3.5–5.1)
Sodium: 132 mEq/L — ABNORMAL LOW (ref 135–145)

## 2011-12-02 LAB — PHOSPHORUS: Phosphorus: 2.1 mg/dL — ABNORMAL LOW (ref 2.3–4.6)

## 2011-12-02 LAB — PROTIME-INR: INR: 1.27 (ref 0.00–1.49)

## 2011-12-02 MED ORDER — POTASSIUM PHOSPHATE DIBASIC 3 MMOLE/ML IV SOLN
10.0000 mmol | Freq: Once | INTRAVENOUS | Status: AC
  Administered 2011-12-02: 10 mmol via INTRAVENOUS
  Filled 2011-12-02: qty 3.33

## 2011-12-02 MED ORDER — WARFARIN SODIUM 7.5 MG PO TABS
7.5000 mg | ORAL_TABLET | Freq: Once | ORAL | Status: AC
  Administered 2011-12-02: 7.5 mg via ORAL
  Filled 2011-12-02: qty 1

## 2011-12-02 MED ORDER — HALOPERIDOL LACTATE 5 MG/ML IJ SOLN
INTRAMUSCULAR | Status: AC
  Administered 2011-12-02: 5 mg via INTRAVENOUS
  Filled 2011-12-02: qty 1

## 2011-12-02 MED ORDER — METOPROLOL TARTRATE 1 MG/ML IV SOLN
5.0000 mg | Freq: Four times a day (QID) | INTRAVENOUS | Status: DC
  Administered 2011-12-02 – 2011-12-05 (×11): 5 mg via INTRAVENOUS
  Filled 2011-12-02 (×11): qty 5

## 2011-12-02 MED ORDER — HALOPERIDOL LACTATE 5 MG/ML IJ SOLN
5.0000 mg | Freq: Once | INTRAMUSCULAR | Status: AC
  Administered 2011-12-02: 5 mg via INTRAVENOUS

## 2011-12-02 NOTE — Progress Notes (Signed)
MEDICATION RELATED CONSULT NOTE  Pharmacy Consult for Phosphorus replacement Indication: hypophosphatemia  Allergies  Allergen Reactions  . Celecoxib Rash    CELEBREX    Patient Measurements: Height: 5\' 9"  (175.3 cm) Weight: 162 lb 3.2 oz (73.573 kg) IBW/kg (Calculated) : 70.7   Vital Signs: Temp: 98 F (36.7 C) (11/17 0800) Temp src: Oral (11/17 0800) BP: 147/67 mmHg (11/17 0400) Pulse Rate: 52  (11/17 0500) Intake/Output from previous day: 11/16 0701 - 11/17 0700 In: 3090 [P.O.:1440; I.V.:1650] Out: 1150 [Urine:1150] Intake/Output from this shift: Total I/O In: -  Out: 200 [Urine:200]  Labs:  [facility] 12/02/11 0811 12/01/11 2308 12/01/11 0509 11/30/11 0522  WBC -- 9.3 11.7* 10.4  HGB -- 11.9* 11.8* 11.6*  HCT -- 35.3* 34.9* 35.2*  PLT -- 220 220 205  APTT -- -- -- --  CREATININE 1.16 1.05 1.08 --  LABCREA -- -- -- --  CREATININE 1.16 1.05 1.08 --  CREAT24HRUR -- -- -- --  MG 1.7 -- -- 1.5  PHOS 2.1* -- 2.1* 1.1*  ALBUMIN -- -- -- --  PROT -- -- -- --  ALBUMIN -- -- -- --  AST -- -- -- --  ALT -- -- -- --  ALKPHOS -- -- -- --  BILITOT -- -- -- --  BILIDIR -- -- -- --  IBILI -- -- -- --   Estimated Creatinine Clearance: 44.9 ml/min (by C-G formula based on Cr of 1.16).   Microbiology: Recent Results (from the past 720 hour(s))  SURGICAL PCR SCREEN     Status: Abnormal   Collection Time   11/23/11  8:30 AM      Component Value Range Status Comment   MRSA, PCR NEGATIVE  NEGATIVE Final    Staphylococcus aureus POSITIVE (*) NEGATIVE Final     Medical History: Past Medical History  Diagnosis Date  . Colon cancer     hx of metastic- treated with surgery and readiation  . DVT (deep venous thrombosis)     hx of s/p Greenfield filter  . OAB (overactive bladder)   . Arthritis   . Cataracts, bilateral   . FH: factor V Leiden deficiency   . History of nephrolithiasis   . History of ankle fracture   . 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  . Peripheral neuropathy   . Vitamin B12 deficiency   . Symptomatic bradycardia   . Sinoatrial node dysfunction   . Sinoatrial node dysfunction   . Pacemaker 4 yrs ago    Medications:  Scheduled:     . antiseptic oral rinse  15 mL Mouth Rinse BID  . enoxaparin (LOVENOX) injection  80 mg Subcutaneous Q12H  . feeding supplement  1 Container Oral TID WC  . [COMPLETED] haloperidol lactate  5 mg Intravenous Once  . [COMPLETED] haloperidol lactate  5 mg Intravenous Once  . [COMPLETED] warfarin  7.5 mg Oral ONCE-1800  . Warfarin - Pharmacist Dosing Inpatient   Does not apply q1800  . [DISCONTINUED] alvimopan  12 mg Oral BID    Assessment: 78 yo M s/p colectomy on 11/28/11.   Phosphorus level is improved after IV phosphorus replacement but remains just below low end of normal.   Potassium level is at low end of normal range. Sodium level is also low.   Goal of Therapy:  Phosphorus level 2.6-4.6  Plan:  1) KPhos IV x1 more dose 2) F/U BMET & Phos in am  Elson Clan 12/02/2011,9:16 AM

## 2011-12-02 NOTE — Progress Notes (Signed)
Pharmacy Brief Note - Alvimopan (Entereg)  The standing order set for alvimopan (Entereg) now includes an automatic order to discontinue the drug after the patient has had a bowel movement.  The change was approved by the Pharmacy & Therapeutics Committee and the Medical Executive Committee.    This patient has had a bowel movement documented by nursing.  Therefore, alvimopan has been discontinued.  If there are questions, please contact the pharmacy at [phone].  Thank you- Elson Clan, [facility] 12/02/2011 9:57 AM

## 2011-12-02 NOTE — Progress Notes (Signed)
4 Days Post-Op  Subjective: Still some confusion.  No complaints of pain.  No nausea.  Objective: Vital signs in last 24 hours: Temp:  [97.4 F (36.3 C)-98 F (36.7 C)] 97.8 F (36.6 C) (11/17 1551) Pulse Rate:  [32-89] 55  (11/17 1551) Resp:  [17-27] 18  (11/17 1551) BP: (147-180)/(61-119) 180/119 mmHg (11/17 1551) SpO2:  [72 %-97 %] 91 % (11/17 1551) Weight:  [73.573 kg (162 lb 3.2 oz)] 73.573 kg (162 lb 3.2 oz) (11/17 0500) Last BM Date: 12/02/11  Intake/Output from previous day: 11/16 0701 - 11/17 0700 In: 3240 [P.O.:1440; I.V.:1800] Out: 1150 [Urine:1150] Intake/Output this shift: Total I/O In: 748.3 [P.O.:120; I.V.:375; IV Piggyback:253.3] Out: 600 [Urine:600]  General appearance: no distress and confused Resp: clear to auscultation bilaterally Cardio: regular rate and rhythm GI: quiet, soft, flat.  Non-tender.  Incision clean dry intact.  Lab Results:   [facility] 12/01/11 2308 12/01/11 0509  WBC 9.3 11.7*  HGB 11.9* 11.8*  HCT 35.3* 34.9*  PLT 220 220   BMET  Basename 12/02/11 0811 12/01/11 2308  NA 134* 132*  K 3.7 3.7  CL 101 100  CO2 25 24  GLUCOSE 111* 133*  BUN 11 10  CREATININE 1.16 1.05  CALCIUM 8.4 8.6   PT/INR  Basename 12/02/11 0811 12/01/11 0700  LABPROT 15.6* 13.7  INR 1.27 1.06   ABG No results found for this basename: PHART:2,PCO2:2,PO2:2,HCO3:2 in the last 72 hours  Studies/Results: No results found.  Anti-infectives: Anti-infectives     Start     Dose/Rate Route Frequency Ordered Stop   11/29/11 0600   ertapenem (INVANZ) 1 g in sodium chloride 0.9 % 50 mL IVPB  Status:  Discontinued        1 g 100 mL/hr over 30 Minutes Intravenous On call to O.R. 11/28/11 1240 11/28/11 1703          Assessment/Plan: s/p Procedure(s) (LRB) with comments: PARTIAL COLECTOMY (N/A) Still with significant confusion and sun downing.  Continue telemetry.    LOS: 4 days    Rama Sorci C 12/02/2011

## 2011-12-02 NOTE — Progress Notes (Signed)
ANTICOAGULATION CONSULT NOTE  Pharmacy Consult for Coumadin Indication: history of DVT  Allergies  Allergen Reactions  . Celecoxib Rash    CELEBREX    Patient Measurements: Height: 5\' 9"  (175.3 cm) Weight: 162 lb 3.2 oz (73.573 kg) IBW/kg (Calculated) : 70.7   Vital Signs: Temp: 98 F (36.7 C) (11/17 0800) Temp src: Oral (11/17 0800) BP: 147/67 mmHg (11/17 0400) Pulse Rate: 52  (11/17 0500)  Labs:  Basename 12/02/11 0811 12/01/11 2308 12/01/11 0700 12/01/11 0509 11/30/11 0522 11/29/11 1545  HGB -- 11.9* -- 11.8* -- --  HCT -- 35.3* -- 34.9* 35.2* --  PLT -- 220 -- 220 205 --  APTT -- -- -- -- -- --  LABPROT 15.6* -- 13.7 >90.0* -- --  INR 1.27 -- 1.06 >10.00* -- --  HEPARINUNFRC -- -- -- -- -- --  CREATININE 1.16 1.05 -- 1.08 -- --  CKTOTAL -- -- -- -- -- 89  CKMB -- -- -- -- -- 2.0  TROPONINI -- -- -- -- -- <0.30    Estimated Creatinine Clearance: 44.9 ml/min (by C-G formula based on Cr of 1.16).   Medical History: Past Medical History  Diagnosis Date  . Colon cancer     hx of metastic- treated with surgery and readiation  . DVT (deep venous thrombosis)     hx of s/p Greenfield filter  . OAB (overactive bladder)   . Arthritis   . Cataracts, bilateral   . FH: factor V Leiden deficiency   . History of nephrolithiasis   . History of ankle fracture   . 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  . Peripheral neuropathy   . Vitamin B12 deficiency   . Symptomatic bradycardia   . Sinoatrial node dysfunction   . Sinoatrial node dysfunction   . Pacemaker 4 yrs ago    Medications:  Scheduled:     . antiseptic oral rinse  15 mL Mouth Rinse BID  . enoxaparin (LOVENOX) injection  80 mg Subcutaneous Q12H  . feeding supplement  1 Container Oral TID WC  . [COMPLETED] haloperidol lactate  5 mg Intravenous Once  . [COMPLETED] haloperidol lactate  5 mg Intravenous Once  . potassium phosphate IVPB (mmol)  10 mmol Intravenous Once  . [COMPLETED]  warfarin  7.5 mg Oral ONCE-1800  . warfarin  7.5 mg Oral ONCE-1800  . Warfarin - Pharmacist Dosing Inpatient   Does not apply q1800  . [DISCONTINUED] alvimopan  12 mg Oral BID    Assessment: 78 yo M POD#4 who is on chronic warfarin 7.5mg  daily for hx DVT.  Coumadin was on hold for colectomy from Nov 6-Nov 15.   Currently he is on full-dose Lovenox to bridge.   INR returning to goal. No bleeding noted.   Goal of Therapy:  INR 2-3 Monitor platelets by anticoagulation protocol: Yes   Plan:  1) Coumadin 7.5mg  po x1 today 2) Daily PT/INR 3) Lovenox per MD 4) Monitor CBC per protocol  Elson Clan 12/02/2011,9:19 AM

## 2011-12-02 NOTE — Progress Notes (Signed)
Patient has been very confused, agitated, restless, and non compliant all night. Patient has been given haldol 5mg  IV twice. Wife called and came to floor to try to ease patient; unsucessfully. Bed alarm on. Vitals WNL.

## 2011-12-03 DIAGNOSIS — C180 Malignant neoplasm of cecum: Principal | ICD-10-CM

## 2011-12-03 LAB — PROTIME-INR: Prothrombin Time: 19.4 seconds — ABNORMAL HIGH (ref 11.6–15.2)

## 2011-12-03 LAB — BASIC METABOLIC PANEL
BUN: 14 mg/dL (ref 6–23)
Calcium: 8.4 mg/dL (ref 8.4–10.5)
Creatinine, Ser: 1.16 mg/dL (ref 0.50–1.35)
GFR calc non Af Amer: 55 mL/min — ABNORMAL LOW (ref >90)
Glucose, Bld: 125 mg/dL — ABNORMAL HIGH (ref 70–99)

## 2011-12-03 LAB — CBC
Hemoglobin: 10.8 g/dL — ABNORMAL LOW (ref 13.0–17.0)
MCH: 29.3 pg (ref 26.0–34.0)
MCHC: 32.8 g/dL (ref 30.0–36.0)
Platelets: 248 10*3/uL (ref 150–400)
RDW: 14.5 % (ref 11.5–15.5)

## 2011-12-03 MED ORDER — WARFARIN SODIUM 5 MG PO TABS
5.0000 mg | ORAL_TABLET | Freq: Once | ORAL | Status: AC
  Administered 2011-12-03: 5 mg via ORAL
  Filled 2011-12-03: qty 1

## 2011-12-03 NOTE — Progress Notes (Signed)
MEDICATION RELATED CONSULT NOTE  Pharmacy Consult for Phosphorus replacement Indication: hypophosphatemia  Allergies  Allergen Reactions  . Celecoxib Rash    CELEBREX    Patient Measurements: Height: 5\' 9"  (175.3 cm) Weight: 162 lb 3.2 oz (73.573 kg) IBW/kg (Calculated) : 70.7   Vital Signs: Temp: 97.5 F (36.4 C) (11/18 0538) Temp src: Oral (11/18 0538) BP: 162/70 mmHg (11/18 0538) Pulse Rate: 65  (11/18 0538) Intake/Output from previous day: 11/17 0701 - 11/18 0700 In: 2552.1 [P.O.:600; I.V.:1698.8; IV Piggyback:253.3] Out: 1229 [Urine:1225; Stool:4] Intake/Output from this shift:    Labs:  Basename 12/03/11 0441 12/02/11 0811 12/01/11 2308 12/01/11 0509  WBC 8.8 -- 9.3 11.7*  HGB 10.8* -- 11.9* 11.8*  HCT 32.9* -- 35.3* 34.9*  PLT 248 -- 220 220  APTT -- -- -- --  CREATININE 1.16 1.16 1.05 --  LABCREA -- -- -- --  CREATININE 1.16 1.16 1.05 --  CREAT24HRUR -- -- -- --  MG -- 1.7 -- --  PHOS 2.5 2.1* -- 2.1*  ALBUMIN -- -- -- --  PROT -- -- -- --  ALBUMIN -- -- -- --  AST -- -- -- --  ALT -- -- -- --  ALKPHOS -- -- -- --  BILITOT -- -- -- --  BILIDIR -- -- -- --  IBILI -- -- -- --   Estimated Creatinine Clearance: 44.9 ml/min (by C-G formula based on Cr of 1.16).  BMET    Component Value Date/Time   NA 135 12/03/2011 0441   K 3.8 12/03/2011 0441   CL 102 12/03/2011 0441   CO2 23 12/03/2011 0441   GLUCOSE 125* 12/03/2011 0441   BUN 14 12/03/2011 0441   CREATININE 1.16 12/03/2011 0441   CALCIUM 8.4 12/03/2011 0441   GFRNONAA 55* 12/03/2011 0441   GFRAA 63* 12/03/2011 0441    Assessment: 78 yo M s/p colectomy on 11/28/11.   Phosphorus level is improved after IV phosphorus and now within goal range.  Goal of Therapy:  Phosphorus level 2.3-4.6  Plan:  1) F/U BMET & Phos in am, no further replacement at this time. Mady Gemma 12/03/2011,8:44 AM

## 2011-12-03 NOTE — Progress Notes (Signed)
UR Chart Review Completed  

## 2011-12-03 NOTE — Progress Notes (Signed)
5 Days Post-Op  Subjective: Still having some sundowning. Does feel weak. Complains of maroon colored stools.  Objective: Vital signs in last 24 hours: Temp:  [97.5 F (36.4 C)-98.2 F (36.8 C)] 97.5 F (36.4 C) (11/18 0538) Pulse Rate:  [51-71] 65  (11/18 0538) Resp:  [16-22] 16  (11/18 0538) BP: (151-197)/(61-119) 162/70 mmHg (11/18 0538) SpO2:  [91 %-96 %] 95 % (11/18 0538) Last BM Date: 12/03/11  Intake/Output from previous day: 11/17 0701 - 11/18 0700 In: 2552.1 [P.O.:600; I.V.:1698.8; IV Piggyback:253.3] Out: 1229 [Urine:1225; Stool:4] Intake/Output this shift: Total I/O In: -  Out: 3 [Stool:3]  General appearance: alert, cooperative and fatigued Resp: clear to auscultation bilaterally Cardio: regularly irregular rhythm GI: Soft. Incision healing well. No rigidity noted.  Lab Results:   Basename 12/03/11 0441 12/01/11 2308  WBC 8.8 9.3  HGB 10.8* 11.9*  HCT 32.9* 35.3*  PLT 248 220   BMET  Basename 12/03/11 0441 12/02/11 0811  NA 135 134*  K 3.8 3.7  CL 102 101  CO2 23 25  GLUCOSE 125* 111*  BUN 14 11  CREATININE 1.16 1.16  CALCIUM 8.4 8.4   PT/INR  Basename 12/03/11 0441 12/02/11 0811  LABPROT 19.4* 15.6*  INR 1.70* 1.27    Studies/Results: No results found.  Anti-infectives: Anti-infectives     Start     Dose/Rate Route Frequency Ordered Stop   11/29/11 0600   ertapenem (INVANZ) 1 g in sodium chloride 0.9 % 50 mL IVPB  Status:  Discontinued        1 g 100 mL/hr over 30 Minutes Intravenous On call to O.R. 11/28/11 1240 11/28/11 1703          Assessment/Plan: s/p Procedure(s): PARTIAL COLECTOMY Impression: Patient passing some old blood. Hemoglobin remains stable. INR is 1.7 today. Will discuss with Dr. Mariel Sleet concerning discontinuing Lovenox. 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 Lymph nodes were negative. Family has been made aware of the diagnosis. Will get physical therapy consultation.  LOS: 5 days     Triva Hueber A 12/03/2011

## 2011-12-03 NOTE — Progress Notes (Signed)
The patient now appears to have a new colon cancer of the cecum,causing partial obstruction or complete obstruction of the appendix, which appeared to be the cause of the changes on the most recent PET scan. He does not appear to be a candidate for chemotherapy for several reasons including poor tolerance and metabolism in the past of the drugs as well as his age and comorbid conditions. He may be a candidate for xrt since this cancer appears to have penetrated fully the colonic wall.  All nodes were negative.  Have discussed his case today with the pathologist and his surgeon and his wife and himself. Will check with Radiation Oncology and continue to follow along.

## 2011-12-03 NOTE — Evaluation (Signed)
Physical Therapy Evaluation Patient Details Name: Justin Lang MRN: [id number] DOB: 02-20-1922 Today's Date: 12/03/2011 Time: 2130-8657 PT Time Calculation (min): 38 min  PT Assessment / Plan / Recommendation Clinical Impression  Pt was seen for eval.  He had open abdominal surgery last week and has not walked since.  He has significant weakness isn the LLE but is able to transfer OOB and ambulate using a walker with SBA for 140'.  Gait is stable.  He tires easily, but should be able to transition home at d/c.    PT Assessment  Patient needs continued PT services    Follow Up Recommendations  Home health PT    Does the patient have the potential to tolerate intense rehabilitation      Barriers to Discharge None      Equipment Recommendations  None recommended by PT    Recommendations for Other Services OT consult   Frequency Min 3X/week    Precautions / Restrictions Precautions Precautions: Fall Restrictions Weight Bearing Restrictions: No   Pertinent Vitals/Pain       Mobility  Bed Mobility Bed Mobility: Left Sidelying to Sit;Sit to Sidelying Left;Scooting to [facility] Left Sidelying to Sit: 5: Supervision;HOB flat Sit to Sidelying Left: 5: Supervision;HOB flat Scooting to HOB: 6: Modified independent (Device/Increase time) Transfers Transfers: Sit to Stand;Stand to Sit Sit to Stand: 5: Supervision;From bed;With upper extremity assist Stand to Sit: 5: Supervision;To bed Details for Transfer Assistance: all movements are very labored and slightly antalgic Ambulation/Gait Ambulation/Gait Assistance: 5: Supervision Ambulation Distance (Feet): 140 Feet Assistive device: Rolling walker Gait Pattern: Trunk flexed;Shuffle Gait velocity: WNL General Gait Details: kyphotic gait pattern...gets SOB, but O2 sat is in the 90s with gait Stairs: No Wheelchair Mobility Wheelchair Mobility: No    Shoulder Instructions     Exercises General Exercises - Lower Extremity Ankle  Circles/Pumps: Strengthening;Both;10 reps;Supine Heel Slides: AROM;Both;AAROM;10 reps;Supine Hip ABduction/ADduction: AROM;AAROM;10 reps;Both;Supine   PT Diagnosis: Difficulty walking;Generalized weakness  PT Problem List: Decreased strength;Decreased activity tolerance;Decreased mobility PT Treatment Interventions: Gait training;Stair training;Therapeutic exercise;Patient/family education   PT Goals Acute Rehab PT Goals PT Goal Formulation: With patient Time For Goal Achievement: 12/17/11 Potential to Achieve Goals: Good Pt will go Supine/Side to Sit: with modified independence;with HOB 0 degrees PT Goal: Supine/Side to Sit - Progress: Goal set today Pt will go Sit to Supine/Side: with modified independence;with HOB 0 degrees PT Goal: Sit to Supine/Side - Progress: Goal set today Pt will go Sit to Stand: with modified independence;with upper extremity assist PT Goal: Sit to Stand - Progress: Goal set today Pt will go Stand to Sit: with modified independence;with upper extremity assist PT Goal: Stand to Sit - Progress: Goal set today Pt will Ambulate: >150 feet;with supervision;with rolling walker PT Goal: Ambulate - Progress: Goal set today Pt will Go Up / Down Stairs: 3-5 stairs;with rail(s);with supervision PT Goal: Up/Down Stairs - Progress: Goal set today  Visit Information  Last PT Received On: 12/03/11    Subjective Data  Subjective: My legs have gotten very weak Patient Stated Goal: none stated   Prior Functioning  Home Living Lives With: Spouse Available Help at Discharge: Family;Available 24 hours/day Type of Home: House Home Access: Stairs to enter [employer] of Steps: 4 Entrance Stairs-Rails: Right;Left;Can reach both Home Layout: Two level;Able to live on main level with bedroom/bathroom Alternate Level Stairs-Number of Steps: hasn;t been upstairs in 20 years Bathroom Shower/Tub: Health visitor: Standard Home Adaptive Equipment:  Straight cane;Walker - rolling;Wheelchair -  manual;Shower chair with back;Bedside commode/3-in-1 Additional Comments: has access to hospital bed if needed Prior Function Level of Independence: Independent with assistive device(s) Able to Take Stairs?: Yes Vocation: Retired Musician: No difficulties    Cognition  Overall Cognitive Status: Appears within functional limits for tasks assessed/performed Arousal/Alertness: Awake/alert Orientation Level: Appears intact for tasks assessed Behavior During Session: [facility] for tasks performed Cognition - Other Comments: is confused at times...thought he was at home a time or two    Extremity/Trunk Assessment Right Lower Extremity Assessment RLE ROM/Strength/Tone: Within functional levels RLE Sensation: History of peripheral neuropathy RLE Coordination: WFL - gross motor Left Lower Extremity Assessment LLE ROM/Strength/Tone: Deficits LLE ROM/Strength/Tone Deficits: strength 2/5 with knee flex to only 90 deg LLE Sensation: History of peripheral neuropathy LLE Coordination: WFL - gross motor Trunk Assessment Trunk Assessment: Kyphotic   Balance Balance Balance Assessed: No  End of Session PT - End of Session Equipment Utilized During Treatment: Gait belt Activity Tolerance: Patient tolerated treatment well Patient left: in bed;with call bell/phone within reach;with bed alarm set Nurse Communication: Mobility status  GP     Konrad Penta 12/03/2011, 1:49 PM

## 2011-12-03 NOTE — Progress Notes (Signed)
ANTICOAGULATION CONSULT NOTE  Pharmacy Consult for Coumadin Indication: history of DVT  Allergies  Allergen Reactions  . Celecoxib Rash    CELEBREX    Patient Measurements: Height: 5\' 9"  (175.3 cm) Weight: 162 lb 3.2 oz (73.573 kg) IBW/kg (Calculated) : 70.7   Vital Signs: Temp: 97.5 F (36.4 C) (11/18 0538) Temp src: Oral (11/18 0538) BP: 162/70 mmHg (11/18 0538) Pulse Rate: 65  (11/18 0538)  Labs:  Basename 12/03/11 0441 12/02/11 0811 12/01/11 2308 12/01/11 0700 12/01/11 0509  HGB 10.8* -- 11.9* -- --  HCT 32.9* -- 35.3* -- 34.9*  PLT 248 -- 220 -- 220  APTT -- -- -- -- --  LABPROT 19.4* 15.6* -- 13.7 --  INR 1.70* 1.27 -- 1.06 --  HEPARINUNFRC -- -- -- -- --  CREATININE 1.16 1.16 1.05 -- --  CKTOTAL -- -- -- -- --  CKMB -- -- -- -- --  TROPONINI -- -- -- -- --    Estimated Creatinine Clearance: 44.9 ml/min (by C-G formula based on Cr of 1.16).   Medical History: Past Medical History  Diagnosis Date  . Colon cancer     hx of metastic- treated with surgery and readiation  . DVT (deep venous thrombosis)     hx of s/p Greenfield filter  . OAB (overactive bladder)   . Arthritis   . Cataracts, bilateral   . FH: factor V Leiden deficiency   . History of nephrolithiasis   . History of ankle fracture   . 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  . Peripheral neuropathy   . Vitamin B12 deficiency   . Symptomatic bradycardia   . Sinoatrial node dysfunction   . Sinoatrial node dysfunction   . Pacemaker 4 yrs ago    Medications:  Scheduled:     . antiseptic oral rinse  15 mL Mouth Rinse BID  . enoxaparin (LOVENOX) injection  80 mg Subcutaneous Q12H  . feeding supplement  1 Container Oral TID WC  . metoprolol  5 mg Intravenous Q6H  . [COMPLETED] potassium phosphate IVPB (mmol)  10 mmol Intravenous Once  . [COMPLETED] warfarin  7.5 mg Oral ONCE-1800  . Warfarin - Pharmacist Dosing Inpatient   Does not apply q1800    Assessment: 78 yo M  POD#5 who is on chronic warfarin 7.5mg  daily for hx DVT.  Coumadin was on hold for colectomy from Nov 6-Nov 15.   Currently he is on full-dose Lovenox to bridge.   INR returning to goal. No bleeding noted.   Goal of Therapy:  INR 2-3 Monitor platelets by anticoagulation protocol: Yes   Plan:  1) Coumadin 5mg  po x1 today 2) Daily PT/INR 3) Lovenox per MD 4) Monitor CBC per protocol  Mady Gemma 12/03/2011,8:48 AM

## 2011-12-04 LAB — CBC
HCT: 32.3 % — ABNORMAL LOW (ref 39.0–52.0)
Hemoglobin: 10.7 g/dL — ABNORMAL LOW (ref 13.0–17.0)
MCH: 29.6 pg (ref 26.0–34.0)
MCHC: 33.1 g/dL (ref 30.0–36.0)

## 2011-12-04 LAB — BASIC METABOLIC PANEL
BUN: 16 mg/dL (ref 6–23)
Chloride: 103 mEq/L (ref 96–112)
Creatinine, Ser: 1.32 mg/dL (ref 0.50–1.35)
GFR calc Af Amer: 54 mL/min — ABNORMAL LOW (ref >90)

## 2011-12-04 LAB — PROTIME-INR: Prothrombin Time: 22.8 seconds — ABNORMAL HIGH (ref 11.6–15.2)

## 2011-12-04 MED ORDER — WARFARIN SODIUM 2.5 MG PO TABS
2.5000 mg | ORAL_TABLET | Freq: Once | ORAL | Status: AC
  Administered 2011-12-04: 2.5 mg via ORAL
  Filled 2011-12-04: qty 1

## 2011-12-04 NOTE — Progress Notes (Signed)
ANTICOAGULATION CONSULT NOTE  Pharmacy Consult for Coumadin Indication: history of DVT  Allergies  Allergen Reactions  . Celecoxib Rash    CELEBREX    Patient Measurements: Height: 5\' 9"  (175.3 cm) Weight: 162 lb 3.2 oz (73.573 kg) IBW/kg (Calculated) : 70.7   Vital Signs: Temp: 99 F (37.2 C) (11/19 0620) Temp src: Oral (11/19 0620) BP: 141/73 mmHg (11/19 0620) Pulse Rate: 79  (11/19 0620)  Labs:  Basename 12/04/11 0446 12/03/11 0441 12/02/11 0811 12/01/11 2308  HGB 10.7* 10.8* -- --  HCT 32.3* 32.9* -- 35.3*  PLT 267 248 -- 220  APTT -- -- -- --  LABPROT 22.8* 19.4* 15.6* --  INR 2.11* 1.70* 1.27 --  HEPARINUNFRC -- -- -- --  CREATININE 1.32 1.16 1.16 --  CKTOTAL -- -- -- --  CKMB -- -- -- --  TROPONINI -- -- -- --    Estimated Creatinine Clearance: 39.4 ml/min (by C-G formula based on Cr of 1.32).   Medical History: Past Medical History  Diagnosis Date  . Colon cancer     hx of metastic- treated with surgery and readiation  . DVT (deep venous thrombosis)     hx of s/p Greenfield filter  . OAB (overactive bladder)   . Arthritis   . Cataracts, bilateral   . FH: factor V Leiden deficiency   . History of nephrolithiasis   . History of ankle fracture   . 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  . Peripheral neuropathy   . Vitamin B12 deficiency   . Symptomatic bradycardia   . Sinoatrial node dysfunction   . Sinoatrial node dysfunction   . Pacemaker 4 yrs ago    Medications:  Scheduled:     . antiseptic oral rinse  15 mL Mouth Rinse BID  . enoxaparin (LOVENOX) injection  80 mg Subcutaneous Q12H  . feeding supplement  1 Container Oral TID WC  . metoprolol  5 mg Intravenous Q6H  . [COMPLETED] warfarin  5 mg Oral ONCE-1800  . Warfarin - Pharmacist Dosing Inpatient   Does not apply q1800    Assessment: 78 yo M with recent partial colectomy who is on chronic warfarin 7.5mg  daily for hx DVT.  Coumadin was on hold for colectomy from  Nov 6-Nov 15.   Currently he is on full-dose Lovenox to bridge.   INR now @ goal x 1. Maroon colored stools per MD note on 11/18, Hg stable.   Goal of Therapy:  INR 2-3 Monitor platelets by anticoagulation protocol: Yes   Plan:  1) Coumadin 2.5mg  po x1 today 2) Daily PT/INR 3) Lovenox per MD (likely can stop with therapeutic INR). 4) Monitor CBC per protocol  Justin Lang 12/04/2011,8:29 AM

## 2011-12-04 NOTE — Progress Notes (Signed)
Physical Therapy Treatment Patient Details Name: Justin Lang MRN: [id number] DOB: 1922-10-19 Today's Date: 12/04/2011 Time: 7829-5621 PT Time Calculation (min): 29 min 1 therex 1 gt  PT Assessment / Plan / Recommendation Comments on Treatment Session  Patient tired/letharic today. Verbal cueing needed for patient to stay on task for seated exercises. Patient did complete 12' of gait training with RW;supervision and verbal cueing to keep RW closer to help with kyphotic posture. Stairs were not attempted today due to patient fatiguing quickly today;however family would like stair traiining tomorrow before d/c    Follow Up Recommendations        Does the patient have the potential to tolerate intense rehabilitation     Barriers to Discharge        Equipment Recommendations       Recommendations for Other Services    Frequency     Plan      Precautions / Restrictions     Pertinent Vitals/Pain     Mobility  Transfers Sit to Stand: From chair/3-in-1;With upper extremity assist;4: Min guard Stand to Sit: 5: Supervision Details for Transfer Assistance: verbal cueing for hand placement Ambulation/Gait Ambulation/Gait Assistance: 5: Supervision Ambulation Distance (Feet): 60 Feet Assistive device: Rolling walker Gait Pattern: Trunk flexed;Shuffle Gait velocity: extremely slow Stairs: No (patient too fatigued today) Wheelchair Mobility Wheelchair Mobility: No    Exercises General Exercises - Upper Extremity Shoulder Horizontal ABduction: Both;10 reps Shoulder Horizontal ADduction: 10 reps;Both General Exercises - Lower Extremity Long Arc Quad: Both;10 reps (resisted) Hip Flexion/Marching: Seated (1 min') Toe Raises: Both;10 reps Heel Raises: 10 reps;Both   PT Diagnosis:    PT Problem List:   PT Treatment Interventions:     PT Goals    Visit Information  Last PT Received On: 12/04/11    Subjective Data      Cognition       Balance     End of Session PT  - End of Session Equipment Utilized During Treatment: Gait belt Activity Tolerance: Patient limited by fatigue Patient left: in chair;with chair alarm set;with family/visitor present   GP     Kareem Aul ATKINSO 12/04/2011, 10:14 AM

## 2011-12-04 NOTE — Progress Notes (Signed)
6 Days Post-Op  Subjective: Still with confusion as to what hospital he is at. Denies any abdominal pain.  Objective: Vital signs in last 24 hours: Temp:  [98.5 F (36.9 C)-99 F (37.2 C)] 99 F (37.2 C) (11/19 0620) Pulse Rate:  [64-79] 79  (11/19 0620) Resp:  [18-19] 19  (11/19 0620) BP: (141-173)/(63-75) 141/73 mmHg (11/19 0620) SpO2:  [96 %] 96 % (11/19 0620) Last BM Date: 12/03/11  Intake/Output from previous day: 11/18 0701 - 11/19 0700 In: 1144.8 [P.O.:360; I.V.:784.8] Out: 5 [Stool:5] Intake/Output this shift:    General appearance: cooperative, no distress and slowed mentation Resp: clear to auscultation bilaterally Cardio: regularly irregular rhythm GI: Soft, incision healing well. Bowel sounds appreciated. No dominant all distention noted.  Lab Results:   [facility] 12/04/11 0446 12/03/11 0441  WBC 11.8* 8.8  HGB 10.7* 10.8*  HCT 32.3* 32.9*  PLT 267 248   BMET  Basename 12/04/11 0446 12/03/11 0441  NA 135 135  K 3.7 3.8  CL 103 102  CO2 25 23  GLUCOSE 98 125*  BUN 16 14  CREATININE 1.32 1.16  CALCIUM 8.5 8.4   PT/INR  Basename 12/04/11 0446 12/03/11 0441  LABPROT 22.8* 19.4*  INR 2.11* 1.70*    Studies/Results: No results found.  Anti-infectives: Anti-infectives     Start     Dose/Rate Route Frequency Ordered Stop   11/29/11 0600   ertapenem (INVANZ) 1 g in sodium chloride 0.9 % 50 mL IVPB  Status:  Discontinued        1 g 100 mL/hr over 30 Minutes Intravenous On call to O.R. 11/28/11 1240 11/28/11 1703          Assessment/Plan: s/p Procedure(s): PARTIAL COLECTOMY Impression: INR is now therapeutic, thus will stop Lovenox. We'll continue physical therapy. Anticipate discharge in next 24-48 hours. Will get home health consultation.  LOS: 6 days    Karren Newland A 12/04/2011

## 2011-12-04 NOTE — Progress Notes (Signed)
MEDICATION RELATED CONSULT NOTE  Pharmacy Consult for Phosphorus replacement Indication: hypophosphatemia  Labs:  [facility] 12/04/11 0446 12/03/11 0441 12/02/11 0811 12/01/11 2308  WBC 11.8* 8.8 -- 9.3  HGB 10.7* 10.8* -- 11.9*  HCT 32.3* 32.9* -- 35.3*  PLT 267 248 -- 220  APTT -- -- -- --  CREATININE 1.32 1.16 1.16 --  LABCREA -- -- -- --  CREATININE 1.32 1.16 1.16 --  CREAT24HRUR -- -- -- --  MG -- -- 1.7 --  PHOS 2.5 2.5 2.1* --  ALBUMIN -- -- -- --  PROT -- -- -- --  ALBUMIN -- -- -- --  AST -- -- -- --  ALT -- -- -- --  ALKPHOS -- -- -- --  BILITOT -- -- -- --  BILIDIR -- -- -- --  IBILI -- -- -- --    Assessment: 78 yo M s/p colectomy on 11/28/11.   Phosphorus level is improved after IV phosphorus and now within goal range x 2.  Goal of Therapy:  Phosphorus level 2.3-4.6  Plan:  1) Sign off for phosphorous replacement.  Please re-consult if/when needed. Lamonte Richer R 12/04/2011,8:28 AM

## 2011-12-05 ENCOUNTER — Telehealth (HOSPITAL_COMMUNITY): Payer: Self-pay | Admitting: Oncology

## 2011-12-05 LAB — CBC
HCT: 30.4 % — ABNORMAL LOW (ref 39.0–52.0)
MCH: 29.9 pg (ref 26.0–34.0)
MCHC: 33.2 g/dL (ref 30.0–36.0)
MCV: 89.9 fL (ref 78.0–100.0)
Platelets: 299 10*3/uL (ref 150–400)
RDW: 14.8 % (ref 11.5–15.5)
WBC: 11.4 10*3/uL — ABNORMAL HIGH (ref 4.0–10.5)

## 2011-12-05 MED ORDER — HYDROCODONE-ACETAMINOPHEN 5-325 MG PO TABS: 1.0000 | ORAL_TABLET | Freq: Four times a day (QID) | ORAL | Status: DC | PRN

## 2011-12-05 NOTE — Telephone Encounter (Signed)
Pt being released from hospital today when should he have pt checked again he was on 2.5 on 11/19 should he get a b-12 injection also

## 2011-12-05 NOTE — Discharge Summary (Signed)
Physician Discharge Summary  Patient ID: Justin Lang MRN: [id number] DOB/AGE: 76-Feb-1924 76 y.o.  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 *    Discharged Condition: fair  Hospital Course: Patient is a 78 year old white male the history of colon carcinoma who presented for exploratory laparotomy do to findings on PET scan which were consistent with possible neoplastic process of the appendix. He underwent an ileocecectomy on 11/28/2011 after being switched to Lovenox and off Coumadin. He tolerated the surgery remarkably well. His postoperative course was remarkable for sundowning. His diet was advanced without difficulty once his bowel function returned. 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 He has been switched back to his Coumadin, thus his Lovenox has been stopped. He is being followed by Dr. Mariel Sleet of oncology. home health has been set up as the patient has been weak and requires ambulatory assistance with a walker.   Treatments: surgery: Ileocecectomy on 11/28/2011   Discharge Exam: Blood pressure 144/64, pulse 71, temperature 97.6 F (36.4 C), temperature source Oral, resp. rate 20, height 5\' 9"  (1.753 m), weight 73.573 kg (162 lb 3.2 oz), SpO2 94.00%. General appearance: cooperative and no distress Resp: clear to auscultation bilaterally Cardio: regular rate and rhythm, S1, S2 normal, no murmur, click, rub or gallop GI: soft, non-tender; bowel sounds normal; no masses,  no organomegaly and Incision healing well.  Disposition: 01-Home or  Self Care  Discharge Orders    Future Appointments: Provider: Department: Dept Phone: Center:   12/06/2011 10:30 AM [facility] [phone] None   12/11/2011 10:30 AM [facility] 7 [facility] [phone] None   12/20/2011 11:00 AM [facility] 7 [facility] [phone] None   12/27/2011 10:30 AM [facility] 7 [facility] [phone] None   12/31/2011 10:00 AM [facility] [employer] [facility] [place]) [phone] [facility]   01/03/2012 10:30 AM [facility] 7 [facility] [phone] None   05/06/2012 10:00 AM Randall An, MD [facility] [phone] None       Medication List     As of 12/05/2011  8:52 AM    TAKE these medications         cyanocobalamin 1000 MCG/ML injection   Commonly known as: (VITAMIN B-12)   Inject 1,000 mcg into the muscle every 30 (thirty) days.      HYDROcodone-acetaminophen 5-325 MG per tablet   Commonly known as: NORCO/VICODIN   Take 1 tablet by mouth every 6 (six) hours as needed.      warfarin 7.5 MG tablet   Commonly known as: COUMADIN   Take 7.5 mg by mouth daily.           Follow-up Information    Follow up with Dalia Heading, MD. Schedule an appointment as soon as possible for a visit on 12/11/2011.   Contact information:   [address] [place] [address] [phone]       Call Randall An, MD. (Follow up coumadin management)    Contact information:   [address] [place] [address] [phone]  SignedFranky Macho A 12/05/2011, 8:52 AM

## 2011-12-05 NOTE — Progress Notes (Signed)
Discharge instructions given to pt and pt's wife with teach back given to RN. Pt. Taken to car via W/C.

## 2011-12-06 ENCOUNTER — Encounter (HOSPITAL_COMMUNITY): Payer: Medicare Other

## 2011-12-07 ENCOUNTER — Encounter (HOSPITAL_BASED_OUTPATIENT_CLINIC_OR_DEPARTMENT_OTHER): Payer: Medicare Other

## 2011-12-07 ENCOUNTER — Other Ambulatory Visit (HOSPITAL_COMMUNITY): Payer: Self-pay

## 2011-12-07 DIAGNOSIS — Z86718 Personal history of other venous thrombosis and embolism: Secondary | ICD-10-CM

## 2011-12-07 DIAGNOSIS — D682 Hereditary deficiency of other clotting factors: Secondary | ICD-10-CM

## 2011-12-07 NOTE — Progress Notes (Signed)
Labs drawn today for pt 

## 2011-12-10 ENCOUNTER — Encounter (HOSPITAL_BASED_OUTPATIENT_CLINIC_OR_DEPARTMENT_OTHER): Payer: Medicare Other

## 2011-12-10 ENCOUNTER — Encounter (HOSPITAL_COMMUNITY): Payer: Medicare Other

## 2011-12-10 DIAGNOSIS — Z86718 Personal history of other venous thrombosis and embolism: Secondary | ICD-10-CM

## 2011-12-10 DIAGNOSIS — D682 Hereditary deficiency of other clotting factors: Secondary | ICD-10-CM

## 2011-12-10 DIAGNOSIS — E538 Deficiency of other specified B group vitamins: Secondary | ICD-10-CM

## 2011-12-10 DIAGNOSIS — R202 Paresthesia of skin: Secondary | ICD-10-CM

## 2011-12-10 LAB — PROTIME-INR: Prothrombin Time: 35 seconds — ABNORMAL HIGH (ref 11.6–15.2)

## 2011-12-10 MED ORDER — CYANOCOBALAMIN 1000 MCG/ML IJ SOLN
1000.0000 ug | Freq: Once | INTRAMUSCULAR | Status: AC
  Administered 2011-12-10: 1000 ug via SUBCUTANEOUS

## 2011-12-10 MED ORDER — CYANOCOBALAMIN 1000 MCG/ML IJ SOLN
INTRAMUSCULAR | Status: AC
  Filled 2011-12-10: qty 1

## 2011-12-10 NOTE — Progress Notes (Signed)
Justin Lang reports that Justin Lang took Coumadin as instructed for the weekend but after he ate supper on Sunday and took his Coumadin he vomited x 1. She did not repeat the Coumadin dose and wanted [place] to be aware in case his results were unexpected.  Justin Lang presents today for injection per MD orders. B12 1000 mcg administered IM in left Upper Arm. Administration without incident. Patient tolerated well.

## 2011-12-10 NOTE — Progress Notes (Signed)
Labs drawn today for pt 

## 2011-12-11 ENCOUNTER — Ambulatory Visit (HOSPITAL_COMMUNITY): Payer: Medicare Other

## 2011-12-12 ENCOUNTER — Encounter (HOSPITAL_COMMUNITY): Payer: Medicare Other

## 2011-12-12 ENCOUNTER — Other Ambulatory Visit (HOSPITAL_COMMUNITY): Payer: Self-pay | Admitting: Oncology

## 2011-12-12 DIAGNOSIS — D682 Hereditary deficiency of other clotting factors: Secondary | ICD-10-CM

## 2011-12-12 DIAGNOSIS — Z86718 Personal history of other venous thrombosis and embolism: Secondary | ICD-10-CM

## 2011-12-12 LAB — PROTIME-INR: INR: 3.24 — ABNORMAL HIGH (ref 0.00–1.49)

## 2011-12-12 NOTE — Progress Notes (Signed)
Labs drawn today for pt 

## 2011-12-12 NOTE — Progress Notes (Signed)
Per Dr. Mariel Sleet, patient needs to hold his coumadin tonight and tomorrow (11/27-11/28) and resume it on Friday (11/29) at 6mg  alternating with 5mg ; recheck INR on Wednesday (12/19/11).  Chinmay Dothard Near's wife contacted and instructions given to her, per Ammie Barrilleaux Zuno's request; pt's wife verbalized understanding of instruction and no further questions at this time.

## 2011-12-19 ENCOUNTER — Encounter (HOSPITAL_BASED_OUTPATIENT_CLINIC_OR_DEPARTMENT_OTHER): Payer: Medicare Other

## 2011-12-19 ENCOUNTER — Encounter (HOSPITAL_COMMUNITY): Payer: Medicare Other | Attending: Oncology

## 2011-12-19 DIAGNOSIS — R209 Unspecified disturbances of skin sensation: Secondary | ICD-10-CM | POA: Insufficient documentation

## 2011-12-19 DIAGNOSIS — R202 Paresthesia of skin: Secondary | ICD-10-CM

## 2011-12-19 DIAGNOSIS — E538 Deficiency of other specified B group vitamins: Secondary | ICD-10-CM

## 2011-12-19 DIAGNOSIS — D682 Hereditary deficiency of other clotting factors: Secondary | ICD-10-CM | POA: Insufficient documentation

## 2011-12-19 DIAGNOSIS — Z86718 Personal history of other venous thrombosis and embolism: Secondary | ICD-10-CM | POA: Insufficient documentation

## 2011-12-19 LAB — PROTIME-INR
INR: 2.38 — ABNORMAL HIGH (ref 0.00–1.49)
Prothrombin Time: 24.9 seconds — ABNORMAL HIGH (ref 11.6–15.2)

## 2011-12-19 MED ORDER — CYANOCOBALAMIN 1000 MCG/ML IJ SOLN
INTRAMUSCULAR | Status: AC
  Filled 2011-12-19: qty 1

## 2011-12-19 MED ORDER — CYANOCOBALAMIN 1000 MCG/ML IJ SOLN
1000.0000 ug | Freq: Once | INTRAMUSCULAR | Status: AC
  Administered 2011-12-19: 1000 ug via SUBCUTANEOUS

## 2011-12-19 NOTE — Progress Notes (Signed)
Labs drawn today for pt 

## 2011-12-19 NOTE — Progress Notes (Signed)
Justin Lang presents today for injection per MD orders. B12 1000mcg administered SQ in left Upper Arm. Administration without incident. Patient tolerated well.  

## 2011-12-20 ENCOUNTER — Encounter (HOSPITAL_COMMUNITY): Payer: Medicare Other

## 2011-12-25 ENCOUNTER — Encounter: Payer: Self-pay | Admitting: Radiation Oncology

## 2011-12-26 NOTE — Progress Notes (Signed)
Consult Cecal Carcinoma,appendiceal neoplasm, 11/28/11 Ileocecectomy=Cecal ca,Invasive mod fiff Adenocarcinoma,invading into muscularis propria with focal visceral involvement,appendix=+ for Adenocarcinoma  Hx Colon Cancer  Radiation therapy 09/02/00-10/07/00 left periaortic nodal recurrence 4500 cGy/25 fxs with Dr.WU ALert,oriented x3, hard of hearing, walking slow steady gait with a cane, no c/o pain, poor appetite, having loose stools, no blood, urgency whn voiding       Has Pacemaker left subclavian 05/19/2008

## 2011-12-27 ENCOUNTER — Encounter (HOSPITAL_COMMUNITY): Payer: Medicare Other

## 2011-12-27 ENCOUNTER — Encounter: Payer: Self-pay | Admitting: Radiation Oncology

## 2011-12-27 ENCOUNTER — Ambulatory Visit
Admission: RE | Admit: 2011-12-27 | Discharge: 2011-12-27 | Disposition: A | Discharge status: Home / Self Care | Payer: Medicare Other | Source: Ambulatory Visit | Attending: Radiation Oncology | Admitting: Radiation Oncology

## 2011-12-27 VITALS — BP 153/70 | HR 90 | Temp 97.6°F | Resp 20 | Ht 59.0 in | Wt 155.1 lb

## 2011-12-27 DIAGNOSIS — C189 Malignant neoplasm of colon, unspecified: Secondary | ICD-10-CM

## 2011-12-27 DIAGNOSIS — C180 Malignant neoplasm of cecum: Secondary | ICD-10-CM | POA: Insufficient documentation

## 2011-12-27 DIAGNOSIS — Z7901 Long term (current) use of anticoagulants: Secondary | ICD-10-CM | POA: Insufficient documentation

## 2011-12-27 DIAGNOSIS — Z9049 Acquired absence of other specified parts of digestive tract: Secondary | ICD-10-CM | POA: Insufficient documentation

## 2011-12-27 DIAGNOSIS — Z86718 Personal history of other venous thrombosis and embolism: Secondary | ICD-10-CM | POA: Insufficient documentation

## 2011-12-27 DIAGNOSIS — Z9221 Personal history of antineoplastic chemotherapy: Secondary | ICD-10-CM | POA: Insufficient documentation

## 2011-12-27 DIAGNOSIS — Z96659 Presence of unspecified artificial knee joint: Secondary | ICD-10-CM | POA: Insufficient documentation

## 2011-12-27 DIAGNOSIS — Z923 Personal history of irradiation: Secondary | ICD-10-CM | POA: Insufficient documentation

## 2011-12-27 HISTORY — DX: Allergy, unspecified, initial encounter: T78.40XA

## 2011-12-27 HISTORY — DX: Personal history of irradiation: Z92.3

## 2011-12-27 HISTORY — DX: Personal history of other diseases of the digestive system: Z87.19

## 2011-12-27 HISTORY — DX: Personal history of antineoplastic chemotherapy: Z92.21

## 2011-12-27 NOTE — Progress Notes (Signed)
Please see the Nurse Progress Note in the MD Initial Consult Encounter for this patient. 

## 2011-12-27 NOTE — Progress Notes (Signed)
[facility] Radiation Oncology NEW PATIENT EVALUATION  Name: Justin Lang MRN: [id number]  Date:   12/27/2011           DOB: [birth year]  Status: outpatient   CC: Dwana Melena, MD  Randall An, MD , Dr. Franky Macho   REFERRING PHYSICIAN: Randall An, MD   DIAGNOSIS: T4 A. N0 moderately differentiated adenocarcinoma of the cecum   HISTORY OF PRESENT ILLNESS:  Justin Lang is a 78 y.o. male who is seen today for the courtesy of Dr. Donneta Romberg still for evaluation of his stage T4a N0 adenocarcinoma of the cecum. 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 He received adjuvant chemotherapy through Dr. Mariel Sleet. He did well until late 2001 when he was found to have a 2.8 cm left periaortic/common iliac node. There was no other evidence for metastatic disease by PET scan. In July 2002 underwent an exploratory laparotomy with Dr. Lovell Sheehan who performed a limited lymphadenectomy but did not completely excised disease since it was adherent to the left common iliac artery. He underwent preoperative radiation therapy here in [place] with Dr. Dorna Bloom and received a dose of 4500 cGy with Xeloda. He underwent surgery with Dr. Joselyn Glassman at [facility], and did well postoperatively. Early this year he presented with a "swollen appendix" leading to a CT scan of 10/14/2011 which showed marked thickening of the cecum with infiltration of the surrounding fat, new compared to May of 2013. A PET scan on 11/14/2011 showed a hypermetabolic mass within the appendiceal/cecum region consistent with malignancy with no other areas of recurrent or metastatic disease. He was taken to surgery by Dr. Lovell Sheehan who performed a right ileocecectomy on November 13 at [facility]. On review of his pathology he was found to have a 1.5 cm invasive adenocarcinoma which invaded the muscularis propria along with focal visceral involvement with the  appendix also being positive for adenocarcinoma. There was a separate was submitted tissue which was found to be adenocarcinoma with associated tumor necrosis measuring 2.0 cm. Dr. Lovell Sheehan described adherence of the inflammatory mass the right pelvic plan which was freed away with sharp and blunt dissection. I spoke with Dr. Lovell Sheehan and also with Dr. Dierdre Searles and pathology , and the second mass was apparently from the intraluminal carcinoma and not dissected off the pelvic brim/bone. 12 lymph nodes were without evidence of metastatic disease. He is slowly recovering. He does report some loosening of his bowels for which he takes Imodium on occasion.  PREVIOUS RADIATION THERAPY: Previous left periaortic nodal radiation, 4500 cGy in 25 sessions delivered from 09/02/2000 through 10/07/2000 under the direction of Dr. Dorna Bloom.   PAST MEDICAL HISTORY:  has a past medical history of DVT (deep venous thrombosis); OAB (overactive bladder); Arthritis; Cataracts, bilateral; FH: factor V Leiden deficiency; History of nephrolithiasis; History of ankle fracture; Carotid artery hypersensitivity; Peripheral neuropathy; Vitamin B12 deficiency; Symptomatic bradycardia; Sinoatrial node dysfunction; Sinoatrial node dysfunction; Pacemaker (4 yrs ago); Colon cancer (07/23/00); Allergy; History of radiation therapy (09/02/00-10/07/00); SBO; and History of chemotherapy.     PAST SURGICAL HISTORY:  Past Surgical History  Procedure Date  . Colectomy     sigmoid secondary to colon ca- 1997  . Exploratory laparotomy     metastatic retroperitoneal lymph node- 2002  . Total knee arthroplasty     right-1991; left-1992  . Back surgeries     x3  . Filter replacement   .  Left ankle surgery   . Incisional herniorraphy   . Pacemaker insertion 05/19/2008  . Replacement total knee bilateral   . Colon resection   . Colonoscopy 08/18/2010    Procedure: COLONOSCOPY;  Surgeon: Malissa Hippo, MD;  Location: [facility];  Service: Endoscopy;   Laterality: N/A;  . Esophagogastroduodenoscopy 08/18/2010    Procedure: ESOPHAGOGASTRODUODENOSCOPY (EGD);  Surgeon: Malissa Hippo, MD;  Location: [facility];  Service: Endoscopy;  Laterality: N/A;  . Partial colectomy 11/28/2011    Procedure: PARTIAL COLECTOMY;  Surgeon: Dalia Heading, MD;  Location: [facility] ORS;  Service: General;  Laterality: N/A;  . Laparoscopic ileocecectomy 11/28/11  . Appendectomy      FAMILY HISTORY: family history includes Cancer in his sister.  There is no history of Anesthesia problems, and Hypotension, and Malignant hyperthermia, and Pseudochol deficiency, . Married, 5 children from his first marriage, and one son with his current wife. No family history of colorectal cancer.   SOCIAL HISTORY:  reports that he has never smoked. He has never used smokeless tobacco. He reports that he does not drink alcohol or use illicit drugs. 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   ALLERGIES: Celecoxib   MEDICATIONS:  Current Outpatient Prescriptions  Medication Sig Dispense Refill  . acetaminophen (TYLENOL) 500 MG tablet Take 500 mg by mouth every 6 (six) hours as needed.      . cyanocobalamin (,VITAMIN B-12,) 1000 MCG/ML injection Inject 1,000 mcg into the muscle once a week.       Marland Kitchen HYDROcodone-acetaminophen (NORCO/VICODIN) 5-325 MG per tablet Take 1 tablet by mouth every 6 (six) hours as needed.  30 tablet  0  . warfarin (COUMADIN) 7.5 MG tablet Take 5 mg by mouth daily. 5mg  every other day with 6mg  every other day per coumadin protocol         REVIEW OF SYSTEMS:  Pertinent items are noted in HPI.    PHYSICAL EXAM:  height is 4\' 11"  (1.499 m) and weight is 155 lb 1.6 oz (70.353 kg). His temperature is 97.6 F (36.4 C). His blood pressure is 153/70 and his pulse is 90. His respiration is 20 and oxygen saturation is 98%.   Alert and oriented. He looks slightly younger than his age of 42. Head neck examination: Grossly unremarkable. Nodes: There is no palpable  cervical, supraclavicular, or inguinal lymphadenopathy. Chest: Left anterior pacemaker. Lungs clear. Heart regular rate rhythm. Abdomen midline and right abdominal scars. Right abdomen slightly tender to palpation. His wound is well-healed. There is no organomegaly. Extremities: Trace ankle edema. Neurologic examination: Grossly nonfocal.   LABORATORY DATA:  Lab Results  Component Value Date   WBC 11.4* 12/05/2011   HGB 10.1* 12/05/2011   HCT 30.4* 12/05/2011   MCV 89.9 12/05/2011   PLT 299 12/05/2011   Lab Results  Component Value Date   NA 135 12/04/2011   K 3.7 12/04/2011   CL 103 12/04/2011   CO2 25 12/04/2011   Lab Results  Component Value Date   ALT 6 10/15/2011   AST 13 10/15/2011   ALKPHOS 62 10/15/2011   BILITOT 1.0 10/15/2011      IMPRESSION: Stage II (T4a N0 M0) adenocarcinoma of the cecum. His history is remarkable with his initial diagnosis of sigmoid cancer going back to June of 1997. On review his preoperative CT scan and PET scan I do not see tumor adjacent to the iliac bone/pelvic brim although there do appear to be adhesions. There is no convincing evidence for  residual disease along the pelvic brim. On other hand, he did have focal perforation through the serosa along the cecum, and he is at risk for peritoneal spread throughout anywhere in the pelvis/abdominal cavity. According to the patient, Dr. Mariel Sleet she does not feel that he is a candidate for adjuvant chemotherapy. There is no of radiation therapy in this setting.   PLAN: As discussed above. He'll maintain close followup with Dr. Mariel Sleet and Dr. Lovell Sheehan. He has done remarkably well.   I spent 40 minutes minutes face to face with the patient and more than 50% of that time was spent in counseling and/or coordination of care.

## 2011-12-28 ENCOUNTER — Encounter (HOSPITAL_BASED_OUTPATIENT_CLINIC_OR_DEPARTMENT_OTHER): Payer: Medicare Other

## 2011-12-28 ENCOUNTER — Encounter (HOSPITAL_COMMUNITY): Payer: Medicare Other

## 2011-12-28 DIAGNOSIS — E538 Deficiency of other specified B group vitamins: Secondary | ICD-10-CM

## 2011-12-28 DIAGNOSIS — R202 Paresthesia of skin: Secondary | ICD-10-CM

## 2011-12-28 DIAGNOSIS — Z86718 Personal history of other venous thrombosis and embolism: Secondary | ICD-10-CM

## 2011-12-28 DIAGNOSIS — D682 Hereditary deficiency of other clotting factors: Secondary | ICD-10-CM

## 2011-12-28 LAB — PROTIME-INR: INR: 2.72 — ABNORMAL HIGH (ref 0.00–1.49)

## 2011-12-28 MED ORDER — CYANOCOBALAMIN 1000 MCG/ML IJ SOLN
1000.0000 ug | Freq: Once | INTRAMUSCULAR | Status: AC
  Administered 2011-12-28: 1000 ug via SUBCUTANEOUS

## 2011-12-28 MED ORDER — CYANOCOBALAMIN 1000 MCG/ML IJ SOLN
INTRAMUSCULAR | Status: AC
  Filled 2011-12-28: qty 1

## 2011-12-28 NOTE — Progress Notes (Signed)
Labs drawn today for cbc/diff 

## 2011-12-28 NOTE — Progress Notes (Signed)
Justin Lang presents today for injection per MD orders. B12 administered SQ in left Upper Arm. Administration without incident. Patient tolerated well.

## 2011-12-31 ENCOUNTER — Encounter: Payer: Self-pay | Admitting: Internal Medicine

## 2011-12-31 ENCOUNTER — Ambulatory Visit (INDEPENDENT_AMBULATORY_CARE_PROVIDER_SITE_OTHER): Payer: Medicare Other | Admitting: *Deleted

## 2011-12-31 DIAGNOSIS — I495 Sick sinus syndrome: Secondary | ICD-10-CM

## 2011-12-31 DIAGNOSIS — Z950 Presence of cardiac pacemaker: Secondary | ICD-10-CM

## 2012-01-03 ENCOUNTER — Encounter (HOSPITAL_COMMUNITY): Payer: Medicare Other

## 2012-01-03 ENCOUNTER — Encounter (HOSPITAL_BASED_OUTPATIENT_CLINIC_OR_DEPARTMENT_OTHER): Payer: Medicare Other

## 2012-01-03 ENCOUNTER — Other Ambulatory Visit (HOSPITAL_COMMUNITY): Payer: Self-pay | Admitting: Oncology

## 2012-01-03 DIAGNOSIS — R202 Paresthesia of skin: Secondary | ICD-10-CM

## 2012-01-03 DIAGNOSIS — Z86718 Personal history of other venous thrombosis and embolism: Secondary | ICD-10-CM

## 2012-01-03 DIAGNOSIS — D682 Hereditary deficiency of other clotting factors: Secondary | ICD-10-CM

## 2012-01-03 DIAGNOSIS — E538 Deficiency of other specified B group vitamins: Secondary | ICD-10-CM

## 2012-01-03 LAB — PROTIME-INR: Prothrombin Time: 28.5 seconds — ABNORMAL HIGH (ref 11.6–15.2)

## 2012-01-03 MED ORDER — CYANOCOBALAMIN 1000 MCG/ML IJ SOLN
1000.0000 ug | Freq: Once | INTRAMUSCULAR | Status: AC
  Administered 2012-01-03: 1000 ug via SUBCUTANEOUS

## 2012-01-03 MED ORDER — CYANOCOBALAMIN 1000 MCG/ML IJ SOLN
INTRAMUSCULAR | Status: AC
  Filled 2012-01-03: qty 1

## 2012-01-03 NOTE — Progress Notes (Signed)
Labs drawn today for pt 

## 2012-01-03 NOTE — Progress Notes (Signed)
Justin Lang presents today for injection per MD orders. B12 1000 mcg administered SQ in right Upper Arm. Administration without incident. Patient tolerated well.

## 2012-01-07 NOTE — Addendum Note (Signed)
Encounter addended by: Lowella Petties, RN on: 01/07/2012  8:35 AM<BR>     Documentation filed: Charges VN

## 2012-01-07 NOTE — Addendum Note (Signed)
Encounter addended by: Delynn Flavin, RN on: 01/07/2012  9:38 AM<BR>     Documentation filed: Charges VN

## 2012-01-11 ENCOUNTER — Telehealth (HOSPITAL_COMMUNITY): Payer: Self-pay | Admitting: *Deleted

## 2012-01-11 ENCOUNTER — Encounter (HOSPITAL_BASED_OUTPATIENT_CLINIC_OR_DEPARTMENT_OTHER): Payer: Medicare Other

## 2012-01-11 DIAGNOSIS — D682 Hereditary deficiency of other clotting factors: Secondary | ICD-10-CM

## 2012-01-11 DIAGNOSIS — Z86718 Personal history of other venous thrombosis and embolism: Secondary | ICD-10-CM

## 2012-01-11 LAB — PROTIME-INR: INR: 2.56 — ABNORMAL HIGH (ref 0.00–1.49)

## 2012-01-11 NOTE — Addendum Note (Signed)
Addended by: Evelena Leyden on: 01/11/2012 03:35 PM   Modules accepted: Orders

## 2012-01-11 NOTE — Progress Notes (Signed)
Labs drawn today for pt 

## 2012-01-11 NOTE — Telephone Encounter (Signed)
[name] stopped me in the hall today and requested that we get Justin Lang in to see someone at [facility]. He is having a lot of diarrhea and incontinence of stool. She says it has been like that since his last surgery here and he would rather be seen at [facility].

## 2012-01-12 LAB — REMOTE PACEMAKER DEVICE
AL AMPLITUDE: 2.4 mv
RV LEAD AMPLITUDE: 4.9 mv
VENTRICULAR PACING PM: 1.82

## 2012-01-14 NOTE — Addendum Note (Signed)
Encounter addended by: Lowella Petties, RN on: 01/14/2012  8:20 AM<BR>     Documentation filed: Charges VN

## 2012-01-15 ENCOUNTER — Other Ambulatory Visit (HOSPITAL_COMMUNITY): Payer: Self-pay | Admitting: Oncology

## 2012-01-18 ENCOUNTER — Other Ambulatory Visit (HOSPITAL_COMMUNITY): Payer: Self-pay | Admitting: Oncology

## 2012-01-18 ENCOUNTER — Encounter (HOSPITAL_COMMUNITY): Payer: Medicare Other | Attending: Oncology

## 2012-01-18 ENCOUNTER — Ambulatory Visit (HOSPITAL_COMMUNITY)
Admission: RE | Admit: 2012-01-18 | Discharge: 2012-01-18 | Disposition: A | Discharge status: Home / Self Care | Payer: Medicare Other | Source: Ambulatory Visit | Attending: Oncology | Admitting: Oncology

## 2012-01-18 ENCOUNTER — Other Ambulatory Visit (HOSPITAL_COMMUNITY): Payer: Self-pay

## 2012-01-18 ENCOUNTER — Encounter (HOSPITAL_COMMUNITY): Payer: Self-pay

## 2012-01-18 ENCOUNTER — Encounter (HOSPITAL_COMMUNITY): Payer: Medicare Other | Admitting: Oncology

## 2012-01-18 ENCOUNTER — Telehealth (HOSPITAL_COMMUNITY): Payer: Self-pay

## 2012-01-18 DIAGNOSIS — N210 Calculus in bladder: Secondary | ICD-10-CM | POA: Insufficient documentation

## 2012-01-18 DIAGNOSIS — Z86718 Personal history of other venous thrombosis and embolism: Secondary | ICD-10-CM | POA: Insufficient documentation

## 2012-01-18 DIAGNOSIS — D682 Hereditary deficiency of other clotting factors: Secondary | ICD-10-CM | POA: Insufficient documentation

## 2012-01-18 DIAGNOSIS — C189 Malignant neoplasm of colon, unspecified: Secondary | ICD-10-CM | POA: Insufficient documentation

## 2012-01-18 DIAGNOSIS — R197 Diarrhea, unspecified: Secondary | ICD-10-CM

## 2012-01-18 DIAGNOSIS — K5730 Diverticulosis of large intestine without perforation or abscess without bleeding: Secondary | ICD-10-CM | POA: Insufficient documentation

## 2012-01-18 DIAGNOSIS — E538 Deficiency of other specified B group vitamins: Secondary | ICD-10-CM

## 2012-01-18 DIAGNOSIS — N200 Calculus of kidney: Secondary | ICD-10-CM | POA: Insufficient documentation

## 2012-01-18 DIAGNOSIS — K7689 Other specified diseases of liver: Secondary | ICD-10-CM | POA: Insufficient documentation

## 2012-01-18 LAB — COMPREHENSIVE METABOLIC PANEL
ALT: 6 U/L (ref 0–53)
Alkaline Phosphatase: 53 U/L (ref 39–117)
CO2: 28 mEq/L (ref 19–32)
Chloride: 105 mEq/L (ref 96–112)
GFR calc Af Amer: 39 mL/min — ABNORMAL LOW (ref >90)
GFR calc non Af Amer: 34 mL/min — ABNORMAL LOW (ref >90)
Glucose, Bld: 114 mg/dL — ABNORMAL HIGH (ref 70–99)
Potassium: 3 mEq/L — ABNORMAL LOW (ref 3.5–5.1)
Sodium: 141 mEq/L (ref 135–145)
Total Bilirubin: 0.2 mg/dL — ABNORMAL LOW (ref 0.3–1.2)

## 2012-01-18 LAB — CBC WITH DIFFERENTIAL/PLATELET
Hemoglobin: 12.6 g/dL — ABNORMAL LOW (ref 13.0–17.0)
Lymphocytes Relative: 18 % (ref 12–46)
Lymphs Abs: 1.7 10*3/uL (ref 0.7–4.0)
Monocytes Relative: 7 % (ref 3–12)
Neutrophils Relative %: 68 % (ref 43–77)
Platelets: 345 10*3/uL (ref 150–400)
RBC: 4.22 MIL/uL (ref 4.22–5.81)
WBC: 9.2 10*3/uL (ref 4.0–10.5)

## 2012-01-18 MED ORDER — CHOLESTYRAMINE 4 G PO PACK: 1.0000 | PACK | Freq: Three times a day (TID) | ORAL | Status: DC

## 2012-01-18 MED ORDER — POTASSIUM CHLORIDE CRYS ER 20 MEQ PO TBCR: 20.0000 meq | EXTENDED_RELEASE_TABLET | Freq: Two times a day (BID) | ORAL | Status: DC

## 2012-01-18 MED ORDER — DIPHENOXYLATE-ATROPINE 2.5-0.025 MG PO TABS: 1.0000 | ORAL_TABLET | Freq: Four times a day (QID) | ORAL | Status: DC | PRN

## 2012-01-18 MED ORDER — IOHEXOL 300 MG/ML  SOLN
80.0000 mL | Freq: Once | INTRAMUSCULAR | Status: AC | PRN
  Administered 2012-01-18: 80 mL via INTRAVENOUS

## 2012-01-18 NOTE — Progress Notes (Signed)
Labs drawn today for cbc/diff,cmp 

## 2012-01-18 NOTE — Addendum Note (Signed)
Addended byLeida Lauth on: 01/18/2012 12:38 PM   Modules accepted: Orders

## 2012-01-18 NOTE — Telephone Encounter (Signed)
Wife instructed that Justin Lang is to continue coumadin same dosage  and to return for next PT/INR in 2 weeks.  Verbalizes understanding.

## 2012-01-21 ENCOUNTER — Telehealth (HOSPITAL_COMMUNITY): Payer: Self-pay | Admitting: *Deleted

## 2012-01-21 ENCOUNTER — Telehealth (HOSPITAL_COMMUNITY): Payer: Self-pay

## 2012-01-21 NOTE — Telephone Encounter (Signed)
Justin Lang states she gave Justin Lang imodium which also caused mouth swelling and she has given him 2 benadryl.

## 2012-01-21 NOTE — Telephone Encounter (Signed)
Per [name], she has already spoken with Dr. Mariel Sleet about Glen's mouth being swollen.

## 2012-01-21 NOTE — Telephone Encounter (Signed)
Message copied by Adelene Amas on Mon Jan 21, 2012 10:37 AM ------      Message from: Mariel Sleet, ERIC S      Created: Tue Jan 15, 2012  5:51 PM       Would he please see a GI person here first?      A surgeon at [facility] is not the person to see.      But if he wants we will get a gi consult at [facility].

## 2012-01-22 ENCOUNTER — Other Ambulatory Visit (HOSPITAL_COMMUNITY): Payer: Medicare Other

## 2012-01-22 ENCOUNTER — Encounter: Payer: Self-pay | Admitting: *Deleted

## 2012-01-22 ENCOUNTER — Other Ambulatory Visit (HOSPITAL_COMMUNITY): Payer: Self-pay

## 2012-02-01 ENCOUNTER — Encounter (HOSPITAL_BASED_OUTPATIENT_CLINIC_OR_DEPARTMENT_OTHER): Payer: Medicare Other

## 2012-02-01 ENCOUNTER — Other Ambulatory Visit (HOSPITAL_COMMUNITY): Payer: Self-pay | Admitting: Oncology

## 2012-02-01 DIAGNOSIS — Z86718 Personal history of other venous thrombosis and embolism: Secondary | ICD-10-CM

## 2012-02-01 DIAGNOSIS — D682 Hereditary deficiency of other clotting factors: Secondary | ICD-10-CM

## 2012-02-01 LAB — PROTIME-INR
INR: 1.76 — ABNORMAL HIGH (ref 0.00–1.49)
Prothrombin Time: 19.9 seconds — ABNORMAL HIGH (ref 11.6–15.2)

## 2012-02-01 NOTE — Progress Notes (Signed)
Labs drawn today for pt 

## 2012-02-11 ENCOUNTER — Other Ambulatory Visit (HOSPITAL_COMMUNITY): Payer: Self-pay | Admitting: Oncology

## 2012-02-11 ENCOUNTER — Encounter (HOSPITAL_BASED_OUTPATIENT_CLINIC_OR_DEPARTMENT_OTHER): Payer: Medicare Other

## 2012-02-11 DIAGNOSIS — D682 Hereditary deficiency of other clotting factors: Secondary | ICD-10-CM

## 2012-02-11 DIAGNOSIS — Z86718 Personal history of other venous thrombosis and embolism: Secondary | ICD-10-CM

## 2012-02-11 NOTE — Progress Notes (Signed)
Labs drawn today for pt 

## 2012-02-18 ENCOUNTER — Encounter (HOSPITAL_COMMUNITY): Payer: Medicare Other | Attending: Oncology

## 2012-02-18 ENCOUNTER — Other Ambulatory Visit (HOSPITAL_COMMUNITY): Payer: Self-pay | Admitting: Oncology

## 2012-02-18 DIAGNOSIS — Z86718 Personal history of other venous thrombosis and embolism: Secondary | ICD-10-CM | POA: Insufficient documentation

## 2012-02-18 DIAGNOSIS — D682 Hereditary deficiency of other clotting factors: Secondary | ICD-10-CM | POA: Insufficient documentation

## 2012-02-18 LAB — PROTIME-INR: Prothrombin Time: 17 seconds — ABNORMAL HIGH (ref 11.6–15.2)

## 2012-02-18 NOTE — Progress Notes (Signed)
Labs drawn today for pt 

## 2012-02-25 ENCOUNTER — Encounter (HOSPITAL_BASED_OUTPATIENT_CLINIC_OR_DEPARTMENT_OTHER): Payer: Medicare Other

## 2012-02-25 ENCOUNTER — Other Ambulatory Visit (HOSPITAL_COMMUNITY): Payer: Self-pay | Admitting: Oncology

## 2012-02-25 DIAGNOSIS — D682 Hereditary deficiency of other clotting factors: Secondary | ICD-10-CM

## 2012-02-25 DIAGNOSIS — Z86718 Personal history of other venous thrombosis and embolism: Secondary | ICD-10-CM

## 2012-02-25 LAB — PROTIME-INR: INR: 2.06 — ABNORMAL HIGH (ref 0.00–1.49)

## 2012-02-25 NOTE — Progress Notes (Signed)
Justin Lang's reason for visit today are for labs as scheduled per MD orders.  Venipuncture performed with a 23 gauge butterfly needle to R Antecubital.  Justin Lang tolerated venipuncture well and without incident; questions were answered and patient was discharged.   Justin Lang states he is taking his Coumadin as follows:  Mon 7mg , Tues. 7mg , Wed. 6mg , Th. 6mg , Fri. 7mg , Sat. 6mg , Sun. 6mg .

## 2012-03-03 ENCOUNTER — Other Ambulatory Visit (HOSPITAL_COMMUNITY): Payer: Self-pay | Admitting: Oncology

## 2012-03-03 ENCOUNTER — Encounter (HOSPITAL_BASED_OUTPATIENT_CLINIC_OR_DEPARTMENT_OTHER): Payer: Medicare Other

## 2012-03-03 DIAGNOSIS — Z86718 Personal history of other venous thrombosis and embolism: Secondary | ICD-10-CM

## 2012-03-03 DIAGNOSIS — D682 Hereditary deficiency of other clotting factors: Secondary | ICD-10-CM

## 2012-03-03 LAB — PROTIME-INR
INR: 2.67 — ABNORMAL HIGH (ref 0.00–1.49)
Prothrombin Time: 27.1 seconds — ABNORMAL HIGH (ref 11.6–15.2)

## 2012-03-03 NOTE — Progress Notes (Signed)
Labs drawn today for pt 

## 2012-03-11 ENCOUNTER — Encounter (HOSPITAL_BASED_OUTPATIENT_CLINIC_OR_DEPARTMENT_OTHER): Payer: Medicare Other

## 2012-03-11 ENCOUNTER — Other Ambulatory Visit (HOSPITAL_COMMUNITY): Payer: Self-pay | Admitting: Oncology

## 2012-03-11 DIAGNOSIS — Z86718 Personal history of other venous thrombosis and embolism: Secondary | ICD-10-CM

## 2012-03-11 LAB — PROTIME-INR: INR: 2.28 — ABNORMAL HIGH (ref 0.00–1.49)

## 2012-03-11 NOTE — Progress Notes (Signed)
Labs drawn today for pt 

## 2012-03-25 ENCOUNTER — Encounter (HOSPITAL_COMMUNITY): Payer: Medicare Other | Attending: Oncology

## 2012-03-25 DIAGNOSIS — Z86718 Personal history of other venous thrombosis and embolism: Secondary | ICD-10-CM

## 2012-03-25 DIAGNOSIS — D682 Hereditary deficiency of other clotting factors: Secondary | ICD-10-CM | POA: Insufficient documentation

## 2012-03-25 NOTE — Progress Notes (Signed)
Labs drawn today for pt 

## 2012-04-07 ENCOUNTER — Other Ambulatory Visit: Payer: Self-pay | Admitting: Internal Medicine

## 2012-04-07 ENCOUNTER — Ambulatory Visit (INDEPENDENT_AMBULATORY_CARE_PROVIDER_SITE_OTHER): Payer: Medicare Other | Admitting: *Deleted

## 2012-04-07 DIAGNOSIS — Z950 Presence of cardiac pacemaker: Secondary | ICD-10-CM

## 2012-04-07 DIAGNOSIS — I495 Sick sinus syndrome: Secondary | ICD-10-CM

## 2012-04-15 ENCOUNTER — Other Ambulatory Visit (HOSPITAL_COMMUNITY): Payer: Self-pay | Admitting: Oncology

## 2012-04-15 ENCOUNTER — Encounter (HOSPITAL_COMMUNITY): Payer: Medicare Other | Attending: Oncology

## 2012-04-15 DIAGNOSIS — M79609 Pain in unspecified limb: Secondary | ICD-10-CM | POA: Insufficient documentation

## 2012-04-15 DIAGNOSIS — R209 Unspecified disturbances of skin sensation: Secondary | ICD-10-CM | POA: Insufficient documentation

## 2012-04-15 DIAGNOSIS — Z86718 Personal history of other venous thrombosis and embolism: Secondary | ICD-10-CM | POA: Insufficient documentation

## 2012-04-15 DIAGNOSIS — R634 Abnormal weight loss: Secondary | ICD-10-CM | POA: Insufficient documentation

## 2012-04-15 DIAGNOSIS — C189 Malignant neoplasm of colon, unspecified: Secondary | ICD-10-CM | POA: Insufficient documentation

## 2012-04-15 DIAGNOSIS — R5381 Other malaise: Secondary | ICD-10-CM | POA: Insufficient documentation

## 2012-04-15 DIAGNOSIS — D682 Hereditary deficiency of other clotting factors: Secondary | ICD-10-CM

## 2012-04-15 LAB — PROTIME-INR: INR: 3.27 — ABNORMAL HIGH (ref 0.00–1.49)

## 2012-04-15 NOTE — Progress Notes (Signed)
Labs drawn today for pt 

## 2012-04-16 LAB — REMOTE PACEMAKER DEVICE
AL AMPLITUDE: 2.2 mv
AL AMPLITUDE: 2.2 mv
AL IMPEDENCE PM: 424 Ohm
AL IMPEDENCE PM: 424 Ohm
BAMS-0001: 170 {beats}/min
RV LEAD AMPLITUDE: 5.9 mv
RV LEAD IMPEDENCE PM: 352 Ohm
VENTRICULAR PACING PM: 1.6

## 2012-04-17 ENCOUNTER — Emergency Department (HOSPITAL_COMMUNITY): Payer: Medicare Other

## 2012-04-17 ENCOUNTER — Telehealth (HOSPITAL_COMMUNITY): Payer: Self-pay | Admitting: *Deleted

## 2012-04-17 ENCOUNTER — Other Ambulatory Visit: Payer: Self-pay

## 2012-04-17 ENCOUNTER — Emergency Department (HOSPITAL_COMMUNITY)
Admission: EM | Admit: 2012-04-17 | Discharge: 2012-04-17 | Disposition: A | Discharge status: Home / Self Care | Payer: Medicare Other | Attending: Emergency Medicine | Admitting: Emergency Medicine

## 2012-04-17 ENCOUNTER — Encounter (HOSPITAL_COMMUNITY): Payer: Self-pay

## 2012-04-17 DIAGNOSIS — Z86718 Personal history of other venous thrombosis and embolism: Secondary | ICD-10-CM | POA: Insufficient documentation

## 2012-04-17 DIAGNOSIS — Z8781 Personal history of (healed) traumatic fracture: Secondary | ICD-10-CM | POA: Insufficient documentation

## 2012-04-17 DIAGNOSIS — Z8679 Personal history of other diseases of the circulatory system: Secondary | ICD-10-CM | POA: Insufficient documentation

## 2012-04-17 DIAGNOSIS — Z85038 Personal history of other malignant neoplasm of large intestine: Secondary | ICD-10-CM | POA: Insufficient documentation

## 2012-04-17 DIAGNOSIS — M129 Arthropathy, unspecified: Secondary | ICD-10-CM | POA: Insufficient documentation

## 2012-04-17 DIAGNOSIS — Z923 Personal history of irradiation: Secondary | ICD-10-CM | POA: Insufficient documentation

## 2012-04-17 DIAGNOSIS — Z8719 Personal history of other diseases of the digestive system: Secondary | ICD-10-CM | POA: Insufficient documentation

## 2012-04-17 DIAGNOSIS — Z8669 Personal history of other diseases of the nervous system and sense organs: Secondary | ICD-10-CM | POA: Insufficient documentation

## 2012-04-17 DIAGNOSIS — Z87828 Personal history of other (healed) physical injury and trauma: Secondary | ICD-10-CM | POA: Insufficient documentation

## 2012-04-17 DIAGNOSIS — Z79899 Other long term (current) drug therapy: Secondary | ICD-10-CM | POA: Insufficient documentation

## 2012-04-17 DIAGNOSIS — R0781 Pleurodynia: Secondary | ICD-10-CM

## 2012-04-17 DIAGNOSIS — R110 Nausea: Secondary | ICD-10-CM | POA: Insufficient documentation

## 2012-04-17 DIAGNOSIS — Z87448 Personal history of other diseases of urinary system: Secondary | ICD-10-CM | POA: Insufficient documentation

## 2012-04-17 DIAGNOSIS — Z862 Personal history of diseases of the blood and blood-forming organs and certain disorders involving the immune mechanism: Secondary | ICD-10-CM | POA: Insufficient documentation

## 2012-04-17 DIAGNOSIS — Z7901 Long term (current) use of anticoagulants: Secondary | ICD-10-CM | POA: Insufficient documentation

## 2012-04-17 DIAGNOSIS — R071 Chest pain on breathing: Secondary | ICD-10-CM | POA: Insufficient documentation

## 2012-04-17 DIAGNOSIS — Z87442 Personal history of urinary calculi: Secondary | ICD-10-CM | POA: Insufficient documentation

## 2012-04-17 DIAGNOSIS — Z950 Presence of cardiac pacemaker: Secondary | ICD-10-CM | POA: Insufficient documentation

## 2012-04-17 LAB — BASIC METABOLIC PANEL
BUN: 13 mg/dL (ref 6–23)
CO2: 25 mEq/L (ref 19–32)
Calcium: 8.7 mg/dL (ref 8.4–10.5)
Creatinine, Ser: 1.56 mg/dL — ABNORMAL HIGH (ref 0.50–1.35)
Glucose, Bld: 91 mg/dL (ref 70–99)

## 2012-04-17 LAB — CBC WITH DIFFERENTIAL/PLATELET
Basophils Absolute: 0 10*3/uL (ref 0.0–0.1)
Eosinophils Relative: 9 % — ABNORMAL HIGH (ref 0–5)
HCT: 42.7 % (ref 39.0–52.0)
Hemoglobin: 13.9 g/dL (ref 13.0–17.0)
Lymphocytes Relative: 24 % (ref 12–46)
Lymphs Abs: 1.3 10*3/uL (ref 0.7–4.0)
MCV: 89.9 fL (ref 78.0–100.0)
Monocytes Absolute: 0.5 10*3/uL (ref 0.1–1.0)
Monocytes Relative: 8 % (ref 3–12)
RDW: 14.4 % (ref 11.5–15.5)
WBC: 5.3 10*3/uL (ref 4.0–10.5)

## 2012-04-17 LAB — TROPONIN I: Troponin I: <0.3 ng/mL (ref <0.30)

## 2012-04-17 MED ORDER — ASPIRIN 81 MG PO CHEW
324.0000 mg | CHEWABLE_TABLET | Freq: Once | ORAL | Status: AC
  Administered 2012-04-17: 324 mg via ORAL
  Filled 2012-04-17: qty 4

## 2012-04-17 MED ORDER — MORPHINE SULFATE 4 MG/ML IJ SOLN
4.0000 mg | Freq: Once | INTRAMUSCULAR | Status: AC
  Administered 2012-04-17: 4 mg via INTRAVENOUS
  Filled 2012-04-17: qty 1

## 2012-04-17 MED ORDER — OXYCODONE-ACETAMINOPHEN 5-325 MG PO TABS: 1.0000 | ORAL_TABLET | ORAL | Status: DC | PRN

## 2012-04-17 MED ORDER — IOHEXOL 350 MG/ML SOLN
80.0000 mL | Freq: Once | INTRAVENOUS | Status: AC | PRN
  Administered 2012-04-17: 80 mL via INTRAVENOUS

## 2012-04-17 NOTE — Telephone Encounter (Signed)
Pt's wife notified to have [name] continue same dose of coumadin and return Tuesday 4/8 for INR

## 2012-04-17 NOTE — Telephone Encounter (Signed)
Same dose  INR on Tuesday

## 2012-04-17 NOTE — ED Provider Notes (Signed)
History     This chart was scribed for Dione Booze, MD, MD by Smitty Pluck, ED Scribe. The patient was seen in room APA02/APA02 and the patient's care was started at 7:34 AM.   CSN: [id number]  Arrival date & time 04/17/12  0720      Chief Complaint  Patient presents with  . Chest Pain    The history is provided by the patient, the spouse and medical records. No language interpreter was used.   Justin Lang is a 79 y.o. male with h/o colon cancer, DVT, SA node dysfunction and symptomatic bradycardia  who presents to the Emergency Department complaining of constant,  moderate, sharp, right chest pain onset 1 day ago at 10:30PM. He states that pain is rated at 7/10. He mentions that breathing deeply aggravates the pain. He denies alleviating factors. Pt took hydrocodone last night without relief. He reports having nausea. Pt denies hx of MI and stroke. Pt denies fever, chills, diaphoresis, radiation of pain, vomiting, diarrhea, weakness, cough, SOB and any other pain. Pt reports that he takes coumadin. Wife reports pt fell 2 weeks ago but xrays were normal.   PCP is Dr. Margo Aye Dr. Mariel Sleet is oncologist   Past Medical History  Diagnosis Date  . DVT (deep venous thrombosis)     hx of s/p [place] filter  . OAB (overactive bladder)   . Arthritis   . Cataracts, bilateral   . FH: factor V Leiden deficiency   . History of nephrolithiasis   . History of ankle fracture   . 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  . Peripheral neuropathy   . Vitamin B12 deficiency   . Symptomatic bradycardia   . Sinoatrial node dysfunction   . Sinoatrial node dysfunction   . Pacemaker 4 yrs ago  . Colon cancer 07/23/00    hx of metastic- treated with surgery and readiation  . Allergy     celebrex  . History of radiation therapy 09/02/00-10/07/00    recurrent colon cancer/left periaortic nodal recurrence=4500cGy/25 fxs,Dr.Wu  . Hx SBO     multiple intermittent and surgery  . History of  chemotherapy     Past Surgical History  Procedure Laterality Date  . Colectomy      sigmoid secondary to colon ca- 1997  . Exploratory laparotomy      metastatic retroperitoneal lymph node- 2002  . Total knee arthroplasty      right-1991; left-1992  . Back surgeries      x3  . Filter replacement    . Left ankle surgery    . Incisional herniorraphy    . Pacemaker insertion  05/19/2008  . Replacement total knee bilateral    . Colon resection    . Colonoscopy  08/18/2010    Procedure: COLONOSCOPY;  Surgeon: Malissa Hippo, MD;  Location: [facility];  Service: Endoscopy;  Laterality: N/A;  . Esophagogastroduodenoscopy  08/18/2010    Procedure: ESOPHAGOGASTRODUODENOSCOPY (EGD);  Surgeon: Malissa Hippo, MD;  Location: [facility];  Service: Endoscopy;  Laterality: N/A;  . Partial colectomy  11/28/2011    Procedure: PARTIAL COLECTOMY;  Surgeon: Dalia Heading, MD;  Location: [facility] ORS;  Service: General;  Laterality: N/A;  . Laparoscopic ileocecectomy  11/28/11  . Appendectomy      Family History  Problem Relation Age of Onset  . Anesthesia problems Neg Hx   . Hypotension Neg Hx   . Malignant hyperthermia Neg Hx   . Pseudochol deficiency  Neg Hx   . Cancer Sister     History  Substance Use Topics  . Smoking status: Never Smoker   . Smokeless tobacco: Never Used  . Alcohol Use: No      Review of Systems  All other systems reviewed and are negative.    Allergies  Celecoxib  Home Medications   Current Outpatient Rx  Name  Route  Sig  Dispense  Refill  . acetaminophen (TYLENOL) 500 MG tablet   Oral   Take 500 mg by mouth every 6 (six) hours as needed.         . cholestyramine (QUESTRAN) 4 G packet   Oral   Take 1 packet by mouth 3 (three) times daily with meals.   90 each   12   . cyanocobalamin (,VITAMIN B-12,) 1000 MCG/ML injection   Intramuscular   Inject 1,000 mcg into the muscle once a week.          . diphenoxylate-atropine (LOMOTIL) 2.5-0.025 MG  per tablet   Oral   Take 1 tablet by mouth 4 (four) times daily as needed for diarrhea or loose stools. TAke 2 after each loose stool no more than 8 per day!   40 tablet   1   . HYDROcodone-acetaminophen (NORCO/VICODIN) 5-325 MG per tablet   Oral   Take 1 tablet by mouth every 6 (six) hours as needed.   30 tablet   0   . potassium chloride SA (K-DUR,KLOR-CON) 20 MEQ tablet   Oral   Take 1 tablet (20 mEq total) by mouth 2 (two) times daily. Take 1 TID!   90 tablet   1   . warfarin (COUMADIN) 5 MG tablet      TAKE AS DIRECTED BY MD   60 tablet   0   . warfarin (COUMADIN) 7.5 MG tablet   Oral   Take 5 mg by mouth daily. 5mg  every other day with 6mg  every other day per coumadin protocol           BP 167/95  Pulse 70  Temp(Src) 98.4 F (36.9 C) (Oral)  Resp 16  Ht 5\' 9"  (1.753 m)  Wt 180 lb (81.647 kg)  BMI 26.57 kg/m2  SpO2 97%  Physical Exam  Nursing note and vitals reviewed. Constitutional: He is oriented to person, place, and time. He appears well-developed and well-nourished. No distress.  HENT:  Head: Normocephalic and atraumatic.  Eyes: EOM are normal. Pupils are equal, round, and reactive to light.  Neck: Normal range of motion. Neck supple. No tracheal deviation present.  Cardiovascular: Normal rate, regular rhythm and normal heart sounds.   Pulmonary/Chest: Effort normal and breath sounds normal. No respiratory distress. He has no wheezes. He has no rales.  Pacemaker present left subclavian area  Abdominal: Soft. Bowel sounds are normal. He exhibits no distension. There is no tenderness. There is no rebound.  Musculoskeletal: Normal range of motion.  Neurological: He is alert and oriented to person, place, and time.  Skin: Skin is warm and dry.  Psychiatric: He has a normal mood and affect. His behavior is normal.    ED Course  Procedures (including critical care time) DIAGNOSTIC STUDIES: Oxygen Saturation is 97% on room air, normal by my  interpretation.    COORDINATION OF CARE: 7:41 AM Discussed ED treatment with pt and pt agrees.  7:41 AM Ordered:  Medications  aspirin chewable tablet 324 mg (324 mg Oral Given 04/17/12 0801)  morphine 4 MG/ML injection 4 mg (4  mg Intravenous Given 04/17/12 0801)       Results for orders placed during the hospital encounter of 04/17/12  CBC WITH DIFFERENTIAL      Result Value Range   WBC 5.3  4.0 - 10.5 K/uL   RBC 4.75  4.22 - 5.81 MIL/uL   Hemoglobin 13.9  13.0 - 17.0 g/dL   HCT 16.1  09.6 - 04.5 %   MCV 89.9  78.0 - 100.0 fL   MCH 29.3  26.0 - 34.0 pg   MCHC 32.6  30.0 - 36.0 g/dL   RDW 40.9  81.1 - 91.4 %   Platelets 242  150 - 400 K/uL   Neutrophils Relative 58  43 - 77 %   Neutro Abs 3.1  1.7 - 7.7 K/uL   Lymphocytes Relative 24  12 - 46 %   Lymphs Abs 1.3  0.7 - 4.0 K/uL   Monocytes Relative 8  3 - 12 %   Monocytes Absolute 0.5  0.1 - 1.0 K/uL   Eosinophils Relative 9 (*) 0 - 5 %   Eosinophils Absolute 0.5  0.0 - 0.7 K/uL   Basophils Relative 1  0 - 1 %   Basophils Absolute 0.0  0.0 - 0.1 K/uL  BASIC METABOLIC PANEL      Result Value Range   Sodium 137  135 - 145 mEq/L   Potassium 3.4 (*) 3.5 - 5.1 mEq/L   Chloride 103  96 - 112 mEq/L   CO2 25  19 - 32 mEq/L   Glucose, Bld 91  70 - 99 mg/dL   BUN 13  6 - 23 mg/dL   Creatinine, Ser 7.82 (*) 0.50 - 1.35 mg/dL   Calcium 8.7  8.4 - 95.6 mg/dL   GFR calc non Af Amer 38 (*) >90 mL/min   GFR calc Af Amer 44 (*) >90 mL/min  TROPONIN I      Result Value Range   Troponin I <0.30  <0.30 ng/mL  PROTIME-INR      Result Value Range   Prothrombin Time 26.7 (*) 11.6 - 15.2 seconds   INR 2.62 (*) 0.00 - 1.49  TROPONIN I      Result Value Range   Troponin I <0.30  <0.30 ng/mL   Ct Angio Chest Pe W/cm &/or Wo Cm  04/17/2012  *RADIOLOGY REPORT*  Clinical Data: Chest pain.  CT ANGIOGRAPHY CHEST  Technique:  Multidetector CT imaging of the chest using the standard protocol during bolus administration of intravenous contrast.  Multiplanar reconstructed images including MIPs were obtained and reviewed to evaluate the vascular anatomy.  Contrast: 80mL OMNIPAQUE IOHEXOL 350 MG/ML SOLN  Comparison: None.  Findings: Moderate right pleural effusion is noted with mild left pleural effusion.  There is adjacent subsegmental atelectasis seen in both lung bases.  Mild interstitial prominence of the lung bases is noted suggesting edema.  Normal contrast filling of the pulmonary arteries and the peripheral branches is noted.  No evidence of pulmonary embolus is noted.  Subcarinal adenopathy is noted measuring 5.6 x 2.2 cm which has increased compared to prior PET scan. Ill-defined right infrahilar density is noted which may be inflammatory in origin but neoplasm cannot be excluded given the history of colon cancer.  IMPRESSION: No evidence of pulmonary embolus.  Bilateral pleural effusions are noted with right greater than left.  Subcarinal adenopathy is noted which has increased in size compared to prior exam PET scan of 2012.  The right infrahilar density is noted as well  which may represent focal inflammation, but neoplasm cannot be excluded. Further evaluation with repeat PET scan is recommended given the history of colon cancer.   Original Report Authenticated By: Lupita Raider.,  M.D.       Date: 04/17/2012  Rate: 70  Rhythm: normal sinus rhythm and premature ventricular contractions (PVC)  QRS Axis: normal  Intervals: PR prolonged  ST/T Wave abnormalities: nonspecific ST/T changes  Conduction Disutrbances:first-degree A-V block   Narrative Interpretation: Sinus rhythm with first-degree A-V block, PVCs, left ventricular hypertrophy with repolarization abnormality. When compared with ECG of 11/29/2011, no significant changes are seen.  Old EKG Reviewed: unchanged    1. Pleuritic chest pain       MDM  Chest pain of uncertain cause. Because of a known colon cancer, he is at increased risk for pulmonary embolism. However, he  is therapeutically anticoagulated. Review of past records shows a somewhat elevated INR drawn 2 days ago. He'll be sent for CT angiogram to rule out pulmonary embolism.  Chest pain has been completely relieved by a single dose of morphine. CT angiogram has come back negative for pneumonia and pulmonary embolism. INR continues to be therapeutic. His sent home with prescription for Percocet.    I personally performed the services described in this documentation, which was scribed in my presence. The recorded information has been reviewed and is accurate.      Dione Booze, MD 04/17/12 1150

## 2012-04-17 NOTE — ED Notes (Signed)
Pt states he has been having chest pain since about 10:30 last night. Denies other symptoms other that diarrhea. States he has diarrhea a lot

## 2012-04-17 NOTE — Telephone Encounter (Signed)
[name] called to let us know that [name] was in the ED today. They did pt inr. Does he still need to come tomorrow.

## 2012-04-18 ENCOUNTER — Other Ambulatory Visit (HOSPITAL_COMMUNITY): Payer: Medicare Other

## 2012-04-22 ENCOUNTER — Encounter (HOSPITAL_BASED_OUTPATIENT_CLINIC_OR_DEPARTMENT_OTHER): Payer: Medicare Other

## 2012-04-22 ENCOUNTER — Other Ambulatory Visit (HOSPITAL_COMMUNITY): Payer: Self-pay | Admitting: Oncology

## 2012-04-22 DIAGNOSIS — D682 Hereditary deficiency of other clotting factors: Secondary | ICD-10-CM

## 2012-04-22 DIAGNOSIS — Z86718 Personal history of other venous thrombosis and embolism: Secondary | ICD-10-CM

## 2012-04-22 LAB — PROTIME-INR
INR: 2.92 — ABNORMAL HIGH (ref 0.00–1.49)
Prothrombin Time: 29 seconds — ABNORMAL HIGH (ref 11.6–15.2)

## 2012-04-22 NOTE — Progress Notes (Signed)
Labs drawn today for pt 

## 2012-04-29 ENCOUNTER — Encounter (HOSPITAL_BASED_OUTPATIENT_CLINIC_OR_DEPARTMENT_OTHER): Payer: Medicare Other

## 2012-04-29 DIAGNOSIS — Z86718 Personal history of other venous thrombosis and embolism: Secondary | ICD-10-CM

## 2012-04-29 DIAGNOSIS — D682 Hereditary deficiency of other clotting factors: Secondary | ICD-10-CM

## 2012-04-29 LAB — PROTIME-INR: INR: 2.51 — ABNORMAL HIGH (ref 0.00–1.49)

## 2012-04-29 NOTE — Progress Notes (Signed)
Labs drawn today for pt 

## 2012-05-06 ENCOUNTER — Other Ambulatory Visit (HOSPITAL_COMMUNITY): Payer: Self-pay | Admitting: Oncology

## 2012-05-06 ENCOUNTER — Encounter (HOSPITAL_COMMUNITY): Payer: Self-pay | Admitting: Oncology

## 2012-05-06 ENCOUNTER — Encounter (HOSPITAL_COMMUNITY): Payer: Medicare Other

## 2012-05-06 ENCOUNTER — Encounter (HOSPITAL_BASED_OUTPATIENT_CLINIC_OR_DEPARTMENT_OTHER): Payer: Medicare Other | Admitting: Oncology

## 2012-05-06 VITALS — BP 162/94 | HR 65 | Temp 97.0°F | Resp 18 | Wt 161.5 lb

## 2012-05-06 DIAGNOSIS — M79671 Pain in right foot: Secondary | ICD-10-CM

## 2012-05-06 DIAGNOSIS — R5381 Other malaise: Secondary | ICD-10-CM

## 2012-05-06 DIAGNOSIS — C189 Malignant neoplasm of colon, unspecified: Secondary | ICD-10-CM

## 2012-05-06 DIAGNOSIS — I82409 Acute embolism and thrombosis of unspecified deep veins of unspecified lower extremity: Secondary | ICD-10-CM

## 2012-05-06 DIAGNOSIS — Z86718 Personal history of other venous thrombosis and embolism: Secondary | ICD-10-CM

## 2012-05-06 DIAGNOSIS — C180 Malignant neoplasm of cecum: Secondary | ICD-10-CM

## 2012-05-06 DIAGNOSIS — R634 Abnormal weight loss: Secondary | ICD-10-CM

## 2012-05-06 DIAGNOSIS — R5383 Other fatigue: Secondary | ICD-10-CM

## 2012-05-06 DIAGNOSIS — D682 Hereditary deficiency of other clotting factors: Secondary | ICD-10-CM

## 2012-05-06 DIAGNOSIS — R531 Weakness: Secondary | ICD-10-CM

## 2012-05-06 DIAGNOSIS — G609 Hereditary and idiopathic neuropathy, unspecified: Secondary | ICD-10-CM

## 2012-05-06 LAB — IRON AND TIBC
Saturation Ratios: 34 % (ref 20–55)
UIBC: 155 ug/dL (ref 125–400)

## 2012-05-06 LAB — CBC WITH DIFFERENTIAL/PLATELET
Basophils Absolute: 0.1 10*3/uL (ref 0.0–0.1)
Basophils Relative: 1 % (ref 0–1)
Eosinophils Relative: 9 % — ABNORMAL HIGH (ref 0–5)
Lymphocytes Relative: 26 % (ref 12–46)
MCHC: 33.3 g/dL (ref 30.0–36.0)
MCV: 89.5 fL (ref 78.0–100.0)
Monocytes Absolute: 0.5 10*3/uL (ref 0.1–1.0)
Neutro Abs: 3.1 10*3/uL (ref 1.7–7.7)
Platelets: 249 10*3/uL (ref 150–400)
RDW: 14.6 % (ref 11.5–15.5)
WBC: 5.7 10*3/uL (ref 4.0–10.5)

## 2012-05-06 LAB — SEDIMENTATION RATE: Sed Rate: 30 mm/h — ABNORMAL HIGH (ref 0–16)

## 2012-05-06 LAB — PROTIME-INR: Prothrombin Time: 21.4 seconds — ABNORMAL HIGH (ref 11.6–15.2)

## 2012-05-06 LAB — HEPATIC FUNCTION PANEL
ALT: 5 U/L (ref 0–53)
AST: 11 U/L (ref 0–37)
Albumin: 3.3 g/dL — ABNORMAL LOW (ref 3.5–5.2)
Alkaline Phosphatase: 63 U/L (ref 39–117)
Bilirubin, Direct: 0.1 mg/dL (ref 0.0–0.3)
Indirect Bilirubin: 0.4 mg/dL (ref 0.3–0.9)
Total Bilirubin: 0.5 mg/dL (ref 0.3–1.2)
Total Protein: 6.9 g/dL (ref 6.0–8.3)

## 2012-05-06 LAB — URIC ACID: Uric Acid, Serum: 4.9 mg/dL (ref 4.0–7.8)

## 2012-05-06 LAB — CORTISOL: Cortisol, Plasma: 8.8 ug/dL

## 2012-05-06 MED ORDER — WARFARIN SODIUM 1 MG PO TABS: 1.0000 mg | ORAL_TABLET | ORAL | Status: DC

## 2012-05-06 NOTE — Patient Instructions (Addendum)
[facility] Discharge Instructions  RECOMMENDATIONS MADE BY THE CONSULTANT AND ANY TEST RESULTS WILL BE SENT TO YOUR REFERRING PHYSICIAN.  Coumadin as follows: Sun 6mg , Mon 7mg , Tue 7mg , Wed 6mg , Thur 6mg , Fri 7mg , Sat 6mg  Next lab due 05/16/12 at 930 am. Additional lab work today. PET scan mid to late May. [name] can call you with results of PET scan. Return to see Dr.Neijstrom end of June 2014.   Thank you for choosing [name] to provide your oncology and hematology care.  To afford each patient quality time with our providers, please arrive at least 15 minutes before your scheduled appointment time.  With your help, our goal is to use those 15 minutes to complete the necessary work-up to ensure our physicians have the information they need to help with your evaluation and healthcare recommendations.    Effective January 1st, 2014, we ask that you re-schedule your appointment with our physicians should you arrive 10 or more minutes late for your appointment.  We strive to give you quality time with our providers, and arriving late affects you and other patients whose appointments are after yours.    Again, thank you for choosing [facility].  Our hope is that these requests will decrease the amount of time that you wait before being seen by our physicians.       _____________________________________________________________  Should you have questions after your visit to [facility], please contact our office at [phone] between the hours of 8:30 a.m. and 5:00 p.m.  Voicemails left after 4:30 p.m. will not be returned until the following business day.  For prescription refill requests, have your pharmacy contact our office with your prescription refill request.

## 2012-05-06 NOTE — Progress Notes (Signed)
Labs drawn today for pt 

## 2012-05-06 NOTE — Progress Notes (Signed)
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 #2 history of colon cancer diagnosed in 1997 followed by recurrence several years later treated with radiation therapy and chemotherapy followed by resection by Dr. Graylin Shiver at [facility] #3 peripheral neuropathy with a history of intermittent borderline B12 levels. He also had a homocysteine level that was elevated and a mild methylmalonic acid level elevation the past we were going to put him on indefinite B12 injections but somehow they have been stopped I suspect inadvertently. #4 DVT and recurrent pulmonary emboli on multiple occasions on lifelong Coumadin with a negative hypercoagulable workup #5 weakness, fatigue, and weight loss and decreased appetite and we will work that up further today #6 diverticulitis September 2013 prompting admission to this hospital with discharge on 10/18/2011 #7 pacemaker insertion for heart disease #8 herpes zoster L5/S1 September 2009 #9 bilateral knee replacements #10 trauma to the left foot with chronic edema #11 acute right foot pain on Friday with severe discomfort some swelling of the foot close to the arch with eventual recovery this morning. This may be gout. If it occurs again I like to see the foot and to consider him for steroid therapy because of the Coumadin.  He is weak tired lethargic losing some weight with no energy. His wife states that he is not always clearheaded etc.  Oncology review of systems is otherwise noncontributory. Lymph node exam is negative throughout. Abdomen is soft and nontender without distention. There is no ascites. Incisions are well-healed. Bowel sounds are active. He has no hepatosplenomegaly. He has no lymphadenopathy. Lungs are clear. Heart shows a slightly irregular rhythm but normal rate. He has no obvious S3 gallop. He has chronic left lower ankle edema the right foot is perfectly normal today. He is alert and I think  oriented.  He does move slowly but is weak in appearance.  We may need to repeat Institute his monthly B12 shots for ever going forward. We'll see what his blood work shows today and since she's had 2 colon cancers we will make sure his PET scan in May shows no evidence recurrence that might account for his weight reduction, weakness, etc. I will see him in June either way.

## 2012-05-07 ENCOUNTER — Encounter (HOSPITAL_BASED_OUTPATIENT_CLINIC_OR_DEPARTMENT_OTHER): Payer: Medicare Other

## 2012-05-07 VITALS — BP 161/87 | HR 63

## 2012-05-07 DIAGNOSIS — R202 Paresthesia of skin: Secondary | ICD-10-CM

## 2012-05-07 DIAGNOSIS — E538 Deficiency of other specified B group vitamins: Secondary | ICD-10-CM

## 2012-05-07 LAB — FOLATE RBC: RBC Folate: 507 ng/mL (ref >=366)

## 2012-05-07 MED ORDER — CYANOCOBALAMIN 1000 MCG/ML IJ SOLN
1000.0000 ug | Freq: Once | INTRAMUSCULAR | Status: AC
  Administered 2012-05-07: 1000 ug via SUBCUTANEOUS

## 2012-05-07 MED ORDER — CYANOCOBALAMIN 1000 MCG/ML IJ SOLN
INTRAMUSCULAR | Status: AC
  Filled 2012-05-07: qty 1

## 2012-05-07 NOTE — Progress Notes (Unsigned)
Justin Lang presents today for injection per MD orders. B12 1000 mcg administered IM in right Upper Arm. Administration without incident. Patient tolerated well. Patient notified that no significant findings in labwork per Dr. Mariel Sleet. Notified that he will need b12 shots life long

## 2012-05-13 ENCOUNTER — Encounter: Payer: Self-pay | Admitting: *Deleted

## 2012-05-14 ENCOUNTER — Encounter: Payer: Self-pay | Admitting: Internal Medicine

## 2012-05-16 ENCOUNTER — Encounter (HOSPITAL_BASED_OUTPATIENT_CLINIC_OR_DEPARTMENT_OTHER): Payer: Medicare Other

## 2012-05-16 ENCOUNTER — Other Ambulatory Visit (HOSPITAL_COMMUNITY): Payer: Self-pay | Admitting: Oncology

## 2012-05-16 ENCOUNTER — Encounter (HOSPITAL_COMMUNITY): Payer: Medicare Other | Attending: Oncology

## 2012-05-16 DIAGNOSIS — R209 Unspecified disturbances of skin sensation: Secondary | ICD-10-CM | POA: Insufficient documentation

## 2012-05-16 DIAGNOSIS — E538 Deficiency of other specified B group vitamins: Secondary | ICD-10-CM | POA: Insufficient documentation

## 2012-05-16 DIAGNOSIS — R202 Paresthesia of skin: Secondary | ICD-10-CM

## 2012-05-16 DIAGNOSIS — Z86718 Personal history of other venous thrombosis and embolism: Secondary | ICD-10-CM | POA: Insufficient documentation

## 2012-05-16 MED ORDER — CYANOCOBALAMIN 1000 MCG/ML IJ SOLN
1000.0000 ug | Freq: Once | INTRAMUSCULAR | Status: AC
  Administered 2012-05-16: 1000 ug via INTRAMUSCULAR

## 2012-05-16 NOTE — Progress Notes (Signed)
Tolerated injection well. 

## 2012-05-16 NOTE — Progress Notes (Signed)
Labs drawn today for pt 

## 2012-05-23 ENCOUNTER — Other Ambulatory Visit (HOSPITAL_COMMUNITY): Payer: Self-pay | Admitting: Oncology

## 2012-05-23 ENCOUNTER — Encounter (HOSPITAL_BASED_OUTPATIENT_CLINIC_OR_DEPARTMENT_OTHER): Payer: Medicare Other

## 2012-05-23 ENCOUNTER — Encounter (HOSPITAL_COMMUNITY): Payer: Medicare Other

## 2012-05-23 DIAGNOSIS — E538 Deficiency of other specified B group vitamins: Secondary | ICD-10-CM

## 2012-05-23 MED ORDER — CYANOCOBALAMIN 1000 MCG/ML IJ SOLN
1000.0000 ug | Freq: Once | INTRAMUSCULAR | Status: AC
  Administered 2012-05-23: 1000 ug via INTRAMUSCULAR

## 2012-05-23 MED ORDER — CYANOCOBALAMIN 1000 MCG/ML IJ SOLN
INTRAMUSCULAR | Status: AC
  Filled 2012-05-23: qty 1

## 2012-05-23 NOTE — Progress Notes (Signed)
Justin Lang presents today for injection per the provider's orders.  Vitamin B12 administered administration without incident; see MAR for injection details.  Patient tolerated procedure well and without incident.  No questions or complaints noted at this time.

## 2012-05-28 ENCOUNTER — Telehealth: Payer: Self-pay | Admitting: Internal Medicine

## 2012-05-28 ENCOUNTER — Telehealth: Payer: Self-pay | Admitting: *Deleted

## 2012-05-28 NOTE — Telephone Encounter (Signed)
Called [place] office to schedule pt for earlier apt per noted Dr Ladona Ridgel in office 06-03-12, was advised no earlier apt available, could be seen by brooke PA on 06-03-12 as well, was advised by DM that KL will call this nurse back to discuss per noted pt concerns not from pacemaker per ED visit on 04-17-12

## 2012-05-28 NOTE — Telephone Encounter (Signed)
.  left message to have patient return my call to clarify sxs

## 2012-05-28 NOTE — Telephone Encounter (Signed)
Spoke with Selena Batten, patients device was was checked on 04/07/12 at time of fall and all parameters were normal.

## 2012-05-28 NOTE — Telephone Encounter (Signed)
[id number] incoming call from [facility] nurse KL to advise pt pacemaker check was more recent in 03-2012 and noted ok at that point, pt did have pleuracy on ED visit noted 04-17-12 and this may be a continuance from this issue, pt can be seen by KL or PCP to address sooner, pt accepted apt with KL tomorrow at 2:40pm 05-29-12

## 2012-05-28 NOTE — Telephone Encounter (Signed)
Patient complains of having no energy and feeling like he is in a hole.  Patient has a pacemaker.  Patient fell around 04/07/2012 really hard and knocked him out for a brief time.  Since this day patient has not felt good and has not had any energy.  Chest xrays have been done and patient has seen cancer doctor and having a scan on Monday.    Patient was asking to have pacemaker checked as not sure if something to do with pacemaker.  Patient also states can go to [employer] if needed, same distance to [employer] or [employer].  Please call and advise if patient needs to be seen or pacemaker needs to be checked.

## 2012-05-28 NOTE — Telephone Encounter (Signed)
Pt noted unable to check Bp at home, gets checked at cancer center 2-3 times weekly and notes elevated at recent checks, pt advised that he is having chest pain once every day, noted as a dull pain, noted lasting between a minute or two, denies swelling, denies SOB, notes fatigue daily, If your signs and symptoms worsen please seek medical attention in your local Emergency Room, do not attempt to drive yourself per further endangerment, call 911 for transport or have someone drive you to the ER as soon as possible, pt understood, please advise

## 2012-05-28 NOTE — Telephone Encounter (Signed)
New problem   Call back line # [phone] concerning pt per Selena Batten @ [place].

## 2012-05-29 ENCOUNTER — Ambulatory Visit: Payer: Medicare Other | Admitting: Adult Health

## 2012-05-29 NOTE — Telephone Encounter (Signed)
Spoke to [facility] NP and YL concerning pt need to be seen by PCP instead of cardio per noted pt concerns more on the PCP level per recent pacemaker check noted clear and ED visit dx was pleuritic chest pain, called PCP Dwana Melena office and scheduled pt to be seen today 05-29-12 by NP at 11:45am per latest apt available, called both pt home and mobile to advise change, will continue to call back to advise the changes

## 2012-05-30 ENCOUNTER — Encounter (HOSPITAL_COMMUNITY): Payer: Medicare Other

## 2012-05-30 ENCOUNTER — Encounter (HOSPITAL_BASED_OUTPATIENT_CLINIC_OR_DEPARTMENT_OTHER): Payer: Medicare Other

## 2012-05-30 DIAGNOSIS — Z86718 Personal history of other venous thrombosis and embolism: Secondary | ICD-10-CM

## 2012-05-30 DIAGNOSIS — E538 Deficiency of other specified B group vitamins: Secondary | ICD-10-CM

## 2012-05-30 LAB — PROTIME-INR
INR: 2.19 — ABNORMAL HIGH (ref 0.00–1.49)
Prothrombin Time: 23.4 seconds — ABNORMAL HIGH (ref 11.6–15.2)

## 2012-05-30 MED ORDER — CYANOCOBALAMIN 1000 MCG/ML IJ SOLN
INTRAMUSCULAR | Status: AC
  Filled 2012-05-30: qty 1

## 2012-05-30 MED ORDER — CYANOCOBALAMIN 1000 MCG/ML IJ SOLN
1000.0000 ug | Freq: Once | INTRAMUSCULAR | Status: AC
  Administered 2012-05-30: 1000 ug via INTRAMUSCULAR

## 2012-05-30 NOTE — Progress Notes (Signed)
Justin Lang presents today for injection per MD orders. B12 administered SQ in left Upper Arm. Administration without incident. Patient tolerated well. Patient is to get one more at the weekly interval and then will do them monthly per Dr. Mariel Sleet.

## 2012-05-30 NOTE — Addendum Note (Signed)
Addended by: Evelena Leyden on: 05/30/2012 05:16 PM   Modules accepted: Orders

## 2012-05-30 NOTE — Progress Notes (Signed)
Labs drawn today for pt 

## 2012-06-02 ENCOUNTER — Encounter (HOSPITAL_COMMUNITY)
Admission: RE | Admit: 2012-06-02 | Discharge: 2012-06-02 | Disposition: A | Discharge status: Home / Self Care | Payer: Medicare Other | Source: Ambulatory Visit | Attending: Oncology | Admitting: Oncology

## 2012-06-02 DIAGNOSIS — R5383 Other fatigue: Secondary | ICD-10-CM | POA: Insufficient documentation

## 2012-06-02 DIAGNOSIS — C189 Malignant neoplasm of colon, unspecified: Secondary | ICD-10-CM | POA: Insufficient documentation

## 2012-06-02 DIAGNOSIS — M79609 Pain in unspecified limb: Secondary | ICD-10-CM | POA: Insufficient documentation

## 2012-06-02 DIAGNOSIS — R634 Abnormal weight loss: Secondary | ICD-10-CM | POA: Insufficient documentation

## 2012-06-02 DIAGNOSIS — R5381 Other malaise: Secondary | ICD-10-CM | POA: Insufficient documentation

## 2012-06-02 DIAGNOSIS — R531 Weakness: Secondary | ICD-10-CM

## 2012-06-02 DIAGNOSIS — M79671 Pain in right foot: Secondary | ICD-10-CM

## 2012-06-02 LAB — GLUCOSE, CAPILLARY: Glucose-Capillary: 83 mg/dL (ref 70–99)

## 2012-06-02 MED ORDER — FLUDEOXYGLUCOSE F - 18 (FDG) INJECTION
20.3000 | Freq: Once | INTRAVENOUS | Status: AC | PRN
  Administered 2012-06-02: 20.3 via INTRAVENOUS

## 2012-06-06 ENCOUNTER — Encounter (HOSPITAL_BASED_OUTPATIENT_CLINIC_OR_DEPARTMENT_OTHER): Payer: Medicare Other

## 2012-06-06 DIAGNOSIS — E538 Deficiency of other specified B group vitamins: Secondary | ICD-10-CM

## 2012-06-06 MED ORDER — CYANOCOBALAMIN 1000 MCG/ML IJ SOLN
INTRAMUSCULAR | Status: AC
  Filled 2012-06-06: qty 1

## 2012-06-06 MED ORDER — CYANOCOBALAMIN 1000 MCG/ML IJ SOLN
1000.0000 ug | Freq: Once | INTRAMUSCULAR | Status: AC
  Administered 2012-06-06: 1000 ug via INTRAMUSCULAR

## 2012-06-06 NOTE — Progress Notes (Signed)
Justin Lang presents today for injection per MD orders. B12 1000 mcg administered SQ in right Upper Arm. Administration without incident. Patient tolerated well.

## 2012-06-11 ENCOUNTER — Telehealth (HOSPITAL_COMMUNITY): Payer: Self-pay | Admitting: *Deleted

## 2012-06-11 NOTE — Telephone Encounter (Signed)
Justin Lang has appt at [facility] with Dr. Leone Haven on 06/20/12 at 11:30 AM.  Have sent Records and they will request the CD's of the Radiology Tests and Path Slides.  Justin Lang and [name] are aware of the Appointment .

## 2012-06-17 ENCOUNTER — Other Ambulatory Visit (HOSPITAL_COMMUNITY): Payer: Self-pay | Admitting: Oncology

## 2012-06-17 ENCOUNTER — Encounter (HOSPITAL_BASED_OUTPATIENT_CLINIC_OR_DEPARTMENT_OTHER): Payer: Medicare Other | Admitting: Oncology

## 2012-06-17 ENCOUNTER — Encounter (HOSPITAL_COMMUNITY): Payer: Medicare Other | Attending: Oncology

## 2012-06-17 ENCOUNTER — Ambulatory Visit (HOSPITAL_COMMUNITY)
Admission: RE | Admit: 2012-06-17 | Discharge: 2012-06-17 | Disposition: A | Discharge status: Home / Self Care | Payer: Medicare Other | Source: Ambulatory Visit | Attending: Oncology | Admitting: Oncology

## 2012-06-17 VITALS — BP 152/71 | HR 58 | Temp 97.7°F | Resp 18

## 2012-06-17 DIAGNOSIS — J90 Pleural effusion, not elsewhere classified: Secondary | ICD-10-CM

## 2012-06-17 DIAGNOSIS — C801 Malignant (primary) neoplasm, unspecified: Secondary | ICD-10-CM

## 2012-06-17 DIAGNOSIS — C189 Malignant neoplasm of colon, unspecified: Secondary | ICD-10-CM

## 2012-06-17 DIAGNOSIS — J189 Pneumonia, unspecified organism: Secondary | ICD-10-CM

## 2012-06-17 DIAGNOSIS — J9819 Other pulmonary collapse: Secondary | ICD-10-CM

## 2012-06-17 DIAGNOSIS — Z86718 Personal history of other venous thrombosis and embolism: Secondary | ICD-10-CM

## 2012-06-17 DIAGNOSIS — R0600 Dyspnea, unspecified: Secondary | ICD-10-CM

## 2012-06-17 DIAGNOSIS — Z7901 Long term (current) use of anticoagulants: Secondary | ICD-10-CM | POA: Insufficient documentation

## 2012-06-17 DIAGNOSIS — Z85038 Personal history of other malignant neoplasm of large intestine: Secondary | ICD-10-CM | POA: Insufficient documentation

## 2012-06-17 DIAGNOSIS — E538 Deficiency of other specified B group vitamins: Secondary | ICD-10-CM | POA: Insufficient documentation

## 2012-06-17 DIAGNOSIS — R0602 Shortness of breath: Secondary | ICD-10-CM | POA: Insufficient documentation

## 2012-06-17 LAB — CBC WITH DIFFERENTIAL/PLATELET
Eosinophils Absolute: 0.5 10*3/uL (ref 0.0–0.7)
Hemoglobin: 13.8 g/dL (ref 13.0–17.0)
Lymphs Abs: 1.2 10*3/uL (ref 0.7–4.0)
MCH: 29.7 pg (ref 26.0–34.0)
Monocytes Relative: 8 % (ref 3–12)
Neutro Abs: 4.1 10*3/uL (ref 1.7–7.7)
Neutrophils Relative %: 65 % (ref 43–77)
Platelets: 220 10*3/uL (ref 150–400)
RBC: 4.65 MIL/uL (ref 4.22–5.81)
WBC: 6.3 10*3/uL (ref 4.0–10.5)

## 2012-06-17 LAB — COMPREHENSIVE METABOLIC PANEL
ALT: <5 U/L (ref 0–53)
Albumin: 3.2 g/dL — ABNORMAL LOW (ref 3.5–5.2)
Alkaline Phosphatase: 53 U/L (ref 39–117)
BUN: 19 mg/dL (ref 6–23)
Chloride: 104 mEq/L (ref 96–112)
GFR calc Af Amer: 42 mL/min — ABNORMAL LOW (ref >90)
Glucose, Bld: 116 mg/dL — ABNORMAL HIGH (ref 70–99)
Potassium: 3.3 mEq/L — ABNORMAL LOW (ref 3.5–5.1)
Sodium: 140 mEq/L (ref 135–145)
Total Bilirubin: 0.5 mg/dL (ref 0.3–1.2)
Total Protein: 7.2 g/dL (ref 6.0–8.3)

## 2012-06-17 MED ORDER — WARFARIN SODIUM 2 MG PO TABS: ORAL_TABLET | ORAL | Status: DC

## 2012-06-17 NOTE — Progress Notes (Signed)
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 His surgery was in 2013 and his wife brought him today because he isn't feeling exactly well. He was slightly short winded. We did a chest x-ray which does show a right pleural effusion and some atelectasis. He is not coughing up phlegm nor does he have a fever or chills.  He does have dullness at the right base and decreased breath sounds at the right base. Left lung is clear.  He has no adenopathy. Heart shows normal rate. There is no S3 gallop.  He does not have ankle edema that is new or different. His 1 ankle is puffy still from prior problems.  We could tap his pleural effusion but we would have to stop his Coumadin. He has an appointment at [facility] with Dr. Joselyn Glassman this Friday and I would not want to risk in developing a complication of this thoracentesis. I think he would need Lovenox transitional coverage.  Therefore since his effusion is not huge and since his O2 sats are 99% we will not tap them at this point in time. If his wife brings him back on Friday afternoon and wants to pursue fluid removal Monday she will not give him Coumadin on Friday Saturday or Sunday but he would be covered with Lovenox 1 mg per kilogram twice a day on Saturday and one dose only Sunday morning before thoracentesis Monday. He would then need Lovenox Monday night, Tuesday twice a day etc. until he is back on the Coumadin which could start Monday night. I think he would be therapeutic by Saturday and his last dose of Lovenox could be Friday afternoon.  His wife wanted to know whether I thought he could tolerate chemotherapy. I don't know if he could tolerate any chemotherapy realistically. He has never been able tolerated before well whatsoever. I think we're going to be looking at palliation.

## 2012-06-17 NOTE — Patient Instructions (Addendum)
[facility] Discharge Instructions  RECOMMENDATIONS MADE BY THE CONSULTANT AND ANY TEST RESULTS WILL BE SENT TO YOUR REFERRING PHYSICIAN.  Keep appointment at [facility] this Friday. When you leave from [facility], if you are the doctor there feels it necessary to have the fluid drawn off your lungs you are to call [facility] or [facility]. [phone] If needed, we will schedule Thoracentesis for Monday 06/23/12. ONLY if you have thoracentesis, you will HOLD Coumadin Fri, Sat and Sun. We would have you come for a Lovenox injection on Sat and Sun here at the clinic. Keep all other appointments here at clinic as already scheduled.  Thank you for choosing [name] to provide your oncology and hematology care.  To afford each patient quality time with our providers, please arrive at least 15 minutes before your scheduled appointment time.  With your help, our goal is to use those 15 minutes to complete the necessary work-up to ensure our physicians have the information they need to help with your evaluation and healthcare recommendations.    Effective January 1st, 2014, we ask that you re-schedule your appointment with our physicians should you arrive 10 or more minutes late for your appointment.  We strive to give you quality time with our providers, and arriving late affects you and other patients whose appointments are after yours.    Again, thank you for choosing [facility].  Our hope is that these requests will decrease the amount of time that you wait before being seen by our physicians.       _____________________________________________________________  Should you have questions after your visit to [facility], please contact our office at [phone] between the hours of 8:30 a.m. and 5:00 p.m.  Voicemails left after 4:30 p.m. will not be returned until the following business day.  For prescription refill requests, have your pharmacy  contact our office with your prescription refill request.

## 2012-06-17 NOTE — Progress Notes (Signed)
Presented to [facility] complaining of worsening shortness of breath, non-productive cough - symptoms began getting worse over night.

## 2012-06-17 NOTE — Progress Notes (Signed)
Labs drawn today for cbc/diff,cmp,pt 

## 2012-06-20 ENCOUNTER — Other Ambulatory Visit (HOSPITAL_COMMUNITY): Payer: Medicare Other

## 2012-06-27 ENCOUNTER — Ambulatory Visit (HOSPITAL_COMMUNITY): Payer: Medicare Other

## 2012-06-30 ENCOUNTER — Encounter (HOSPITAL_BASED_OUTPATIENT_CLINIC_OR_DEPARTMENT_OTHER): Payer: Medicare Other

## 2012-06-30 VITALS — BP 138/76 | HR 62

## 2012-06-30 DIAGNOSIS — C189 Malignant neoplasm of colon, unspecified: Secondary | ICD-10-CM

## 2012-06-30 DIAGNOSIS — E538 Deficiency of other specified B group vitamins: Secondary | ICD-10-CM

## 2012-06-30 LAB — BASIC METABOLIC PANEL
BUN: 19 mg/dL (ref 6–23)
CO2: 30 mEq/L (ref 19–32)
Glucose, Bld: 76 mg/dL (ref 70–99)
Potassium: 3.6 mEq/L (ref 3.5–5.1)
Sodium: 141 mEq/L (ref 135–145)

## 2012-06-30 MED ORDER — CYANOCOBALAMIN 1000 MCG/ML IJ SOLN
INTRAMUSCULAR | Status: AC
  Filled 2012-06-30: qty 1

## 2012-06-30 MED ORDER — CYANOCOBALAMIN 1000 MCG/ML IJ SOLN
1000.0000 ug | Freq: Once | INTRAMUSCULAR | Status: AC
  Administered 2012-06-30: 1000 ug via INTRAMUSCULAR

## 2012-06-30 NOTE — Progress Notes (Signed)
Justin Lang presents today for injection per MD orders. B12 administered SQ in right Upper Arm. Administration without incident. Patient tolerated well. Labs drawn via venipuncture to R[facility].  No incident, patient tolerated well.  BMET drawn today even though scheduled for 07/08/12 because patient was recently prescribed Lasix at [facility] and they wanted [place] to check potassium and creatinine today.

## 2012-07-04 ENCOUNTER — Ambulatory Visit (HOSPITAL_COMMUNITY): Payer: Medicare Other

## 2012-07-08 ENCOUNTER — Encounter (HOSPITAL_BASED_OUTPATIENT_CLINIC_OR_DEPARTMENT_OTHER): Payer: Medicare Other | Admitting: Oncology

## 2012-07-08 ENCOUNTER — Encounter (HOSPITAL_COMMUNITY): Payer: Medicare Other

## 2012-07-08 VITALS — BP 145/80 | HR 60 | Resp 16

## 2012-07-08 DIAGNOSIS — C189 Malignant neoplasm of colon, unspecified: Secondary | ICD-10-CM

## 2012-07-08 DIAGNOSIS — Z86718 Personal history of other venous thrombosis and embolism: Secondary | ICD-10-CM

## 2012-07-08 DIAGNOSIS — Z7901 Long term (current) use of anticoagulants: Secondary | ICD-10-CM

## 2012-07-08 DIAGNOSIS — R1900 Intra-abdominal and pelvic swelling, mass and lump, unspecified site: Secondary | ICD-10-CM

## 2012-07-08 LAB — BASIC METABOLIC PANEL
CO2: 28 mEq/L (ref 19–32)
Chloride: 102 mEq/L (ref 96–112)
Glucose, Bld: 92 mg/dL (ref 70–99)
Potassium: 3.9 mEq/L (ref 3.5–5.1)
Sodium: 139 mEq/L (ref 135–145)

## 2012-07-08 LAB — PROTIME-INR: INR: 1.16 (ref 0.00–1.49)

## 2012-07-08 MED ORDER — ENOXAPARIN SODIUM 80 MG/0.8ML ~~LOC~~ SOLN: 70.0000 mg | Freq: Two times a day (BID) | SUBCUTANEOUS | Status: DC

## 2012-07-08 NOTE — Patient Instructions (Addendum)
[facility] Discharge Instructions  RECOMMENDATIONS MADE BY THE CONSULTANT AND ANY TEST RESULTS WILL BE SENT TO YOUR REFERRING PHYSICIAN.  EXAM FINDINGS BY THE PHYSICIAN TODAY AND SIGNS OR SYMPTOMS TO REPORT TO CLINIC OR PRIMARY PHYSICIAN: Discussion by MD.  Call us when you hear from [facility].  Tobie Lords, RN  [phone] not here on Thursdays.)  MEDICATIONS PRESCRIBED:  Lovenox e-scribed to your pharmacy.     SPECIAL INSTRUCTIONS/FOLLOW-UP: Follow-up in 1 month.  Thank you for choosing [name] to provide your oncology and hematology care.  To afford each patient quality time with our providers, please arrive at least 15 minutes before your scheduled appointment time.  With your help, our goal is to use those 15 minutes to complete the necessary work-up to ensure our physicians have the information they need to help with your evaluation and healthcare recommendations.    Effective January 1st, 2014, we ask that you re-schedule your appointment with our physicians should you arrive 10 or more minutes late for your appointment.  We strive to give you quality time with our providers, and arriving late affects you and other patients whose appointments are after yours.    Again, thank you for choosing [facility].  Our hope is that these requests will decrease the amount of time that you wait before being seen by our physicians.       _____________________________________________________________  Should you have questions after your visit to [facility], please contact our office at [phone] between the hours of 8:30 a.m. and 5:00 p.m.  Voicemails left after 4:30 p.m. will not be returned until the following business day.  For prescription refill requests, have your pharmacy contact our office with your prescription refill request.

## 2012-07-08 NOTE — Progress Notes (Signed)
The patient was seen today after his followup visit to [facility] with a CT-guided biopsy of an abdominal mass. The pathology is not back yet.Marland Kitchen He is nontherapeutic from the Coumadin standpoint. They did not cover him with post biopsy Lovenox. I think he is in need of that since he has such a high-risk of recurrent DVT/pulmonary emboli.  We will give and 70 mg of Lovenox apparent twice a day to his therapeutic. He we'll continue with his usual dose of Coumadin to come back Friday for a INR.  His wife will call him when she hears from [facility]

## 2012-07-08 NOTE — Progress Notes (Signed)
Labs drawn today for bmp,pt

## 2012-07-09 ENCOUNTER — Encounter: Payer: Self-pay | Admitting: Internal Medicine

## 2012-07-09 ENCOUNTER — Ambulatory Visit (INDEPENDENT_AMBULATORY_CARE_PROVIDER_SITE_OTHER): Payer: Medicare Other | Admitting: Internal Medicine

## 2012-07-09 ENCOUNTER — Telehealth (HOSPITAL_COMMUNITY): Payer: Self-pay

## 2012-07-09 VITALS — BP 110/60 | HR 62 | Ht 69.0 in | Wt 160.0 lb

## 2012-07-09 DIAGNOSIS — Z950 Presence of cardiac pacemaker: Secondary | ICD-10-CM

## 2012-07-09 DIAGNOSIS — I495 Sick sinus syndrome: Secondary | ICD-10-CM

## 2012-07-09 LAB — PACEMAKER DEVICE OBSERVATION
AL IMPEDENCE PM: 392 Ohm
ATRIAL PACING PM: 72.29
BATTERY VOLTAGE: 2.99 V
RV LEAD IMPEDENCE PM: 416 Ohm
VENTRICULAR PACING PM: 1.74

## 2012-07-09 NOTE — Patient Instructions (Signed)
Your physician recommends that you schedule a follow-up appointment in: ONE YEAR  Your physician recommends that you continue on your current medications as directed. Please refer to the Current Medication list given to you today.  

## 2012-07-09 NOTE — Telephone Encounter (Signed)
Call from [facility] - stated "[facility] called last night and talked with my son and he was told that [place] had colon cancer and that they were going to set him up to see Dr. Reginia Naas."

## 2012-07-09 NOTE — Assessment & Plan Note (Signed)
His Medtronic dual-chamber pacemaker is stable and working normally. His battery voltage has fallen a bit. We will follow this closely. Hopefully he will not require generator change but we would consider doing so if he reaches elective replacement.

## 2012-07-09 NOTE — Progress Notes (Signed)
HPI Justin Lang returns today for followup. He is a very pleasant 79 year old man with a history of symptomatic bradycardia, status post permanent pacemaker insertion, atrial fibrillation, on systemic anticoagulation. The patient has developed recurrent, metastatic, colon cancer. He is pending evaluation by oncology at [facility]. In the interim, he feels weak and fatigued but denies chest pain, shortness of breath, or syncope. He has mild peripheral edema. Allergies  Allergen Reactions  . Celecoxib Rash    CELEBREX     Current Outpatient Prescriptions  Medication Sig Dispense Refill  . acetaminophen (TYLENOL) 500 MG tablet Take 500 mg by mouth every 6 (six) hours as needed for pain.       . cholestyramine (QUESTRAN) 4 G packet Take 1 packet by mouth as needed.       . cholestyramine (QUESTRAN) 4 G packet Take 4 g by mouth 2 (two) times daily before meals. Mix dose in 60-180 mL of water, milk or juice.      . diphenoxylate-atropine (LOMOTIL) 2.5-0.025 MG per tablet 1 tablet. Take 1 tablet by mouth 4 (four) times daily as needed for Diarrhea.      . enoxaparin (LOVENOX) 80 MG/0.8ML injection Inject 0.7 mLs (70 mg total) into the skin every 12 (twelve) hours.  14 Syringe  1  . oxyCODONE-acetaminophen (PERCOCET/ROXICET) 5-325 MG per tablet Take 1 tablet by mouth every 4 (four) hours as needed for pain.  20 tablet  0  . potassium chloride SA (K-DUR,KLOR-CON) 20 MEQ tablet 20 mEq daily.       . promethazine (PHENERGAN) 12.5 MG tablet 12.5 mg. Take 12.5 mg by mouth every 6 (six) hours as needed for Nausea.      Marland Kitchen warfarin (COUMADIN) 1 MG tablet Take 1 tablet (1 mg total) by mouth as directed.  60 tablet  2  . warfarin (COUMADIN) 2 MG tablet Combine with other strengths as instructed for proper dose  30 tablet  0  . warfarin (COUMADIN) 5 MG tablet Take 5 mg by mouth daily. Takes as directed per MD.      . warfarin (COUMADIN) 5 MG tablet TAKE AS DIRECTED BY MD  60 tablet  0   No current  facility-administered medications for this visit.     Past Medical History  Diagnosis Date  . DVT (deep venous thrombosis)     hx of s/p Greenfield filter  . OAB (overactive bladder)   . Arthritis   . Cataracts, bilateral   . FH: factor V Leiden deficiency   . History of nephrolithiasis   . History of ankle fracture   . 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  . Peripheral neuropathy   . Vitamin B12 deficiency   . Symptomatic bradycardia   . Sinoatrial node dysfunction   . Sinoatrial node dysfunction   . Pacemaker 4 yrs ago  . Colon cancer 07/23/00    hx of metastic- treated with surgery and readiation  . Allergy     celebrex  . History of radiation therapy 09/02/00-10/07/00    recurrent colon cancer/left periaortic nodal recurrence=4500cGy/25 fxs,Dr.Wu  . Hx SBO     multiple intermittent and surgery  . History of chemotherapy     ROS:   All systems reviewed and negative except as noted in the HPI.   Past Surgical History  Procedure Laterality Date  . Colectomy      sigmoid secondary to colon ca- 1997  . Exploratory laparotomy      metastatic retroperitoneal  lymph node- 2002  . Total knee arthroplasty      right-1991; left-1992  . Back surgeries      x3  . Filter replacement    . Left ankle surgery    . Incisional herniorraphy    . Pacemaker insertion  05/19/2008  . Replacement total knee bilateral    . Colon resection    . Colonoscopy  08/18/2010    Procedure: COLONOSCOPY;  Surgeon: Malissa Hippo, MD;  Location: [facility];  Service: Endoscopy;  Laterality: N/A;  . Esophagogastroduodenoscopy  08/18/2010    Procedure: ESOPHAGOGASTRODUODENOSCOPY (EGD);  Surgeon: Malissa Hippo, MD;  Location: [facility];  Service: Endoscopy;  Laterality: N/A;  . Partial colectomy  11/28/2011    Procedure: PARTIAL COLECTOMY;  Surgeon: Dalia Heading, MD;  Location: [facility];  Service: General;  Laterality: N/A;  . Laparoscopic ileocecectomy  11/28/11  .  Appendectomy       Family History  Problem Relation Age of Onset  . Anesthesia problems Neg Hx   . Hypotension Neg Hx   . Malignant hyperthermia Neg Hx   . Pseudochol deficiency Neg Hx   . Cancer Sister      History   Social History  . Marital Status: Married    Spouse Name: N/A    Number of Children: 6  . Years of Education: N/A   Occupational History  . Not on file.   Social History Main Topics  . Smoking status: Never Smoker   . Smokeless tobacco: Never Used  . Alcohol Use: No  . Drug Use: No  . Sexually Active: No   Other Topics Concern  . Not on file   Social History Narrative   Married, lives with spouse.      BP 110/60  Pulse 62  Ht 5\' 9"  (1.753 m)  Wt 160 lb (72.576 kg)  BMI 23.62 kg/m2  Physical Exam:  Chronically ill appearing 79 year old man,NAD HEENT: Unremarkable Neck:  6 cm JVD, no thyromegally Back:  No CVA tenderness Lungs:  Clear with no wheezes, rales, or rhonchi. HEART:  Regular rate rhythm, no murmurs, no rubs, no clicks Abd:  soft, positive bowel sounds, no organomegally, no rebound, no guarding Ext:  2 plus pulses, trace peripheral edema, no cyanosis, no clubbing Skin:  No rashes no nodules Neuro:  CN II through XII intact, motor grossly intact  DEVICE  Normal device function.  See PaceArt for details.   Assess/Plan:

## 2012-07-11 ENCOUNTER — Encounter (HOSPITAL_BASED_OUTPATIENT_CLINIC_OR_DEPARTMENT_OTHER): Payer: Medicare Other

## 2012-07-11 DIAGNOSIS — Z86718 Personal history of other venous thrombosis and embolism: Secondary | ICD-10-CM

## 2012-07-11 LAB — PROTIME-INR
INR: 1.51 — ABNORMAL HIGH (ref 0.00–1.49)
Prothrombin Time: 17.8 seconds — ABNORMAL HIGH (ref 11.6–15.2)

## 2012-07-11 NOTE — Progress Notes (Signed)
Labs drawn today for pt 

## 2012-07-14 ENCOUNTER — Encounter (HOSPITAL_COMMUNITY): Payer: Medicare Other

## 2012-07-14 DIAGNOSIS — Z86718 Personal history of other venous thrombosis and embolism: Secondary | ICD-10-CM

## 2012-07-14 LAB — PROTIME-INR
INR: 1.78 — ABNORMAL HIGH (ref 0.00–1.49)
Prothrombin Time: 20.2 seconds — ABNORMAL HIGH (ref 11.6–15.2)

## 2012-07-14 NOTE — Progress Notes (Signed)
Labs drawn today for pt 

## 2012-07-16 ENCOUNTER — Encounter (HOSPITAL_COMMUNITY): Payer: Medicare Other | Attending: Oncology

## 2012-07-16 ENCOUNTER — Other Ambulatory Visit (HOSPITAL_COMMUNITY): Payer: Self-pay | Admitting: Oncology

## 2012-07-16 DIAGNOSIS — Z86718 Personal history of other venous thrombosis and embolism: Secondary | ICD-10-CM | POA: Insufficient documentation

## 2012-07-16 DIAGNOSIS — C189 Malignant neoplasm of colon, unspecified: Secondary | ICD-10-CM | POA: Insufficient documentation

## 2012-07-16 LAB — PROTIME-INR
INR: 1.85 — ABNORMAL HIGH (ref 0.00–1.49)
Prothrombin Time: 20.8 seconds — ABNORMAL HIGH (ref 11.6–15.2)

## 2012-07-16 NOTE — Progress Notes (Signed)
Labs drawn today for pt 

## 2012-07-16 NOTE — Progress Notes (Addendum)
Will check INR on Monday 7/7 instead of 7/3 and Justin Lang is to continue the lovenox and take coumadin 7 mg daily.  Spoke with [name] and verbalized understanding of instructions.

## 2012-07-16 NOTE — Progress Notes (Signed)
Message left on voicemail for patient to continue lovenox and to take coumadin 7 mg daily. Has refill on lovenox and can get it refilled.  To come back on Thursday for next INR.

## 2012-07-17 ENCOUNTER — Other Ambulatory Visit (HOSPITAL_COMMUNITY): Payer: Self-pay

## 2012-07-17 ENCOUNTER — Other Ambulatory Visit (HOSPITAL_COMMUNITY): Payer: Medicare Other

## 2012-07-17 ENCOUNTER — Ambulatory Visit (HOSPITAL_COMMUNITY): Payer: Medicare Other | Admitting: Oncology

## 2012-07-17 DIAGNOSIS — C189 Malignant neoplasm of colon, unspecified: Secondary | ICD-10-CM

## 2012-07-21 ENCOUNTER — Encounter (HOSPITAL_BASED_OUTPATIENT_CLINIC_OR_DEPARTMENT_OTHER): Payer: Medicare Other

## 2012-07-21 ENCOUNTER — Other Ambulatory Visit (HOSPITAL_COMMUNITY): Payer: Self-pay | Admitting: Oncology

## 2012-07-21 DIAGNOSIS — Z86718 Personal history of other venous thrombosis and embolism: Secondary | ICD-10-CM

## 2012-07-21 DIAGNOSIS — C189 Malignant neoplasm of colon, unspecified: Secondary | ICD-10-CM

## 2012-07-21 NOTE — Progress Notes (Signed)
Spoke with wife and instructed that Justin Lang is to stop the lovenox and continue coumadin 7 mg daily and to return for next PT/INR on 07/28/12.  Verbalizes understanding.

## 2012-07-21 NOTE — Progress Notes (Signed)
Labs drawn today for pt, Vit D25

## 2012-07-22 LAB — VITAMIN D 25 HYDROXY (VIT D DEFICIENCY, FRACTURES): Vit D, 25-Hydroxy: 29 ng/mL — ABNORMAL LOW (ref 30–89)

## 2012-07-23 ENCOUNTER — Other Ambulatory Visit (HOSPITAL_COMMUNITY): Payer: Self-pay | Admitting: Oncology

## 2012-07-28 ENCOUNTER — Other Ambulatory Visit (HOSPITAL_COMMUNITY): Payer: Self-pay | Admitting: Oncology

## 2012-07-28 ENCOUNTER — Encounter (HOSPITAL_BASED_OUTPATIENT_CLINIC_OR_DEPARTMENT_OTHER): Payer: Medicare Other

## 2012-07-28 DIAGNOSIS — Z86718 Personal history of other venous thrombosis and embolism: Secondary | ICD-10-CM

## 2012-07-28 NOTE — Progress Notes (Signed)
Justin Lang's reason for visit today are for labs as scheduled per MD orders.  Venipuncture performed with a 23 gauge butterfly needle to R Antecubital.  Justin Lang tolerated venipuncture well and without incident; questions were answered and patient was discharged.   Justin Lang states he is currently taking coumadin 7mg  daily.  He was also questioning whether Vit D 1000 units is an appropriate dose to take, and I'll defer that question to T. Kefalas, PA-C.

## 2012-07-31 ENCOUNTER — Telehealth: Payer: Self-pay | Admitting: Hematology and Oncology

## 2012-07-31 NOTE — Telephone Encounter (Signed)
C/D 07/31/12 for appt. 08/01/12

## 2012-07-31 NOTE — Telephone Encounter (Signed)
S/W WIFE IN RE TO NP APPT 07/18 @ 10:30 W/DR. [place] WIFE CONFIRM APPTS.   LVOM FOR SON MARK BARKER [phone] IN RE TO NP APPT 07/18 @ 10:30 W/DR. [place]   REFERRING OFFICE IS AWARE OF NP APPT.

## 2012-08-01 ENCOUNTER — Encounter: Payer: Self-pay | Admitting: Hematology and Oncology

## 2012-08-01 ENCOUNTER — Ambulatory Visit (HOSPITAL_BASED_OUTPATIENT_CLINIC_OR_DEPARTMENT_OTHER): Payer: Medicare Other | Admitting: Hematology and Oncology

## 2012-08-01 ENCOUNTER — Telehealth: Payer: Self-pay | Admitting: *Deleted

## 2012-08-01 ENCOUNTER — Other Ambulatory Visit: Payer: Self-pay | Admitting: *Deleted

## 2012-08-01 ENCOUNTER — Ambulatory Visit (HOSPITAL_BASED_OUTPATIENT_CLINIC_OR_DEPARTMENT_OTHER): Payer: Medicare Other | Admitting: Lab

## 2012-08-01 ENCOUNTER — Other Ambulatory Visit (HOSPITAL_BASED_OUTPATIENT_CLINIC_OR_DEPARTMENT_OTHER): Payer: Medicare Other | Admitting: Lab

## 2012-08-01 ENCOUNTER — Ambulatory Visit: Payer: Medicare Other | Admitting: Internal Medicine

## 2012-08-01 ENCOUNTER — Ambulatory Visit: Payer: Medicare Other

## 2012-08-01 ENCOUNTER — Ambulatory Visit (HOSPITAL_COMMUNITY): Payer: Medicare Other

## 2012-08-01 ENCOUNTER — Telehealth: Payer: Self-pay | Admitting: Hematology and Oncology

## 2012-08-01 ENCOUNTER — Other Ambulatory Visit: Payer: Medicare Other | Admitting: Lab

## 2012-08-01 VITALS — BP 172/90 | HR 63 | Temp 97.0°F | Resp 20 | Ht 66.0 in | Wt 159.7 lb

## 2012-08-01 DIAGNOSIS — Z5181 Encounter for therapeutic drug level monitoring: Secondary | ICD-10-CM

## 2012-08-01 DIAGNOSIS — Z86718 Personal history of other venous thrombosis and embolism: Secondary | ICD-10-CM

## 2012-08-01 DIAGNOSIS — Z7901 Long term (current) use of anticoagulants: Secondary | ICD-10-CM

## 2012-08-01 DIAGNOSIS — C189 Malignant neoplasm of colon, unspecified: Secondary | ICD-10-CM

## 2012-08-01 DIAGNOSIS — I82409 Acute embolism and thrombosis of unspecified deep veins of unspecified lower extremity: Secondary | ICD-10-CM

## 2012-08-01 LAB — PROTIME-INR: INR: 3.3 (ref 2.00–3.50)

## 2012-08-01 LAB — CBC WITH DIFFERENTIAL/PLATELET
BASO%: 0.9 % (ref 0.0–2.0)
Basophils Absolute: 0.1 10*3/uL (ref 0.0–0.1)
EOS%: 9.9 % — ABNORMAL HIGH (ref 0.0–7.0)
HCT: 40.8 % (ref 38.4–49.9)
HGB: 13.6 g/dL (ref 13.0–17.1)
LYMPH%: 19.1 % (ref 14.0–49.0)
MCH: 30 pg (ref 27.2–33.4)
MCHC: 33.4 g/dL (ref 32.0–36.0)
MCV: 90 fL (ref 79.3–98.0)
MONO%: 8.3 % (ref 0.0–14.0)
NEUT%: 61.8 % (ref 39.0–75.0)
lymph#: 1.3 10*3/uL (ref 0.9–3.3)

## 2012-08-01 LAB — BASIC METABOLIC PANEL (CC13)
BUN: 16.8 mg/dL (ref 7.0–26.0)
Calcium: 9 mg/dL (ref 8.4–10.4)
Chloride: 105 mEq/L (ref 98–109)
Creatinine: 1.7 mg/dL — ABNORMAL HIGH (ref 0.7–1.3)

## 2012-08-01 LAB — CEA: CEA: 3.2 ng/mL (ref 0.0–5.0)

## 2012-08-01 NOTE — Telephone Encounter (Signed)
gave pt appt for lab and Md for August 2014

## 2012-08-01 NOTE — Progress Notes (Signed)
[facility]  Telephone:([phone]) [phone] Fax:(336) [phone]   MEDICAL ONCOLOGY - INITIAL CONSULATION   Reason for Referral: Relapsed colon cancer   HPI: We had the pleasure of seeing [name] our clinic earlier today for consultation regarding his relapsed colon cancer. 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 Subsequently further treatment was aborted and patient did not finish the rest of his adjuvant. Subsequently in 2002 [name] had abdominal cancer (since he does not know what kind of cancer was) is for which he underwent an attempt with surgical resection at OSH and he was told that full resection would be too dangerous since  this mass was very close to the aorta, thus,  partial resection was performed and subsequently patient was transferred to [facility] to see Dr. Ladona Ridgel for biopsy of the paraortic lymph node which was done and came back as adenocarcinoma.  Most recently in November 2013 patient underwent surgical resection of the terminal ileum and cecal adenocarcinoma and pathology revealed appendiceal orifice/cecal adenocarcinoma T4 N0. Was found to have 2 peritoneal implants and was referred to do a for present arrhythmias and an implant biopsy which was done on 07/04/2012 and pathology revealed metastatic carcinoma compatible with colorectal cancer. Patient was seen by Dr. Reginia Naas and his recommendation was to proceed with systemic chemotherapy using capecitabine  plus bevacizumab as a palliative measure if patient is interested to receive chemotherapy. In the  clinic today [name] is accompanied by his wife and 2 sons and daughter-in-law. He is complaining of being tired, fatigued and diarrhea. States his appetite stable and his weight has been stable, denied abdominal pain denied nausea or vomiting, denied fever or chills but he complained of upper back pain. No numbness or tingling, denied any neurological deficit.    Past Medical History  Diagnosis Date  . DVT (deep venous thrombosis)     hx of s/p Greenfield filter  . OAB (overactive bladder)   . Arthritis   . Cataracts, bilateral   . FH: factor V Leiden deficiency   . History of nephrolithiasis   . History of ankle fracture   . 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  . Peripheral neuropathy   . Vitamin B12 deficiency   . Symptomatic bradycardia   . Sinoatrial node dysfunction   . Sinoatrial node dysfunction   . Pacemaker 4 yrs ago  . Colon cancer 07/23/00    hx of metastic- treated with surgery and readiation  . Allergy     celebrex  . History of radiation therapy 09/02/00-10/07/00    recurrent colon cancer/left periaortic nodal recurrence=4500cGy/25 fxs,Dr.Wu  . Hx SBO     multiple intermittent and surgery  . History of chemotherapy   :  Past Surgical History  Procedure Laterality Date  . Colectomy      sigmoid secondary to colon ca- 1997  . Exploratory laparotomy      metastatic retroperitoneal lymph node- 2002  . Total knee arthroplasty      right-1991; left-1992  . Back surgeries      x3  .  Filter replacement    . Left ankle surgery    . Incisional herniorraphy    . Pacemaker insertion  05/19/2008  . Replacement total knee bilateral    . Colon resection    . Colonoscopy  08/18/2010    Procedure: COLONOSCOPY;  Surgeon: Malissa Hippo, MD;  Location: [facility];  Service: Endoscopy;  Laterality: N/A;  . Esophagogastroduodenoscopy  08/18/2010    Procedure: ESOPHAGOGASTRODUODENOSCOPY (EGD);  Surgeon: Malissa Hippo, MD;  Location: [facility];  Service:  Endoscopy;  Laterality: N/A;  . Partial colectomy  11/28/2011    Procedure: PARTIAL COLECTOMY;  Surgeon: Dalia Heading, MD;  Location: [facility];  Service: General;  Laterality: N/A;  . Laparoscopic ileocecectomy  11/28/11  . Appendectomy    :  Current Outpatient Prescriptions  Medication Sig Dispense Refill  . acetaminophen (TYLENOL) 500 MG tablet Take 500 mg by mouth every 6 (six) hours as needed for pain.       . cholestyramine (QUESTRAN) 4 G packet Take 1 packet by mouth as needed.       . diphenoxylate-atropine (LOMOTIL) 2.5-0.025 MG per tablet 1 tablet. Take 1 tablet by mouth 4 (four) times daily as needed for Diarrhea.      Marland Kitchen oxyCODONE-acetaminophen (PERCOCET/ROXICET) 5-325 MG per tablet Take 1 tablet by mouth every 4 (four) hours as needed for pain.  20 tablet  0  . potassium chloride SA (K-DUR,KLOR-CON) 20 MEQ tablet 20 mEq daily.       . promethazine (PHENERGAN) 12.5 MG tablet 12.5 mg. Take 12.5 mg by mouth every 6 (six) hours as needed for Nausea.      Marland Kitchen warfarin (COUMADIN) 1 MG tablet Take 1 tablet (1 mg total) by mouth as directed.  60 tablet  2  . warfarin (COUMADIN) 2 MG tablet COMBINE WITH OTHER STRENGTHS AS INSTRUCTED FOR PROPER DOSE  30 tablet  2  . warfarin (COUMADIN) 5 MG tablet TAKE AS DIRECTED  60 tablet  3   No current facility-administered medications for this visit.     Allergies  Allergen Reactions  . Celecoxib Rash    CELEBREX  :  Family History  Problem Relation Age of Onset  . Anesthesia problems Neg Hx   . Hypotension Neg Hx   . Malignant hyperthermia Neg Hx   . Pseudochol deficiency Neg Hx   . Cancer Sister   :  History   Social History  . Marital Status: Married    Spouse Name: N/A    Number of Children: 6  . Years of Education: N/A   Occupational History  . Not on file.   Social History Main Topics  . Smoking status: Never Smoker   . Smokeless tobacco: Never Used  . Alcohol Use: No  . Drug Use: No  . Sexually Active: No   Other  Topics Concern  . Not on file   Social History Narrative   Married, lives with spouse.   :  Pertinent items are noted in HPI.  Exam: Blood pressure 172/90, pulse 63, temperature 97 F (36.1 C), temperature source Oral, resp. rate 20, height 5\' 6"  (1.676 m), weight 159 lb 11.2 oz (72.439 kg).   ECOG PERFORMANCE STATUS: 2-3  GENERAL: No distress, elderly.  SKIN:  No rashes or significant lesions  HEAD: Normocephalic, No masses, lesions, tenderness or abnormalities  EYES: Conjunctiva are pink and non-injected  ENT: External ears normal ,lips , buccal mucosa, and tongue normal and mucous membranes are moist  LYMPH:  No palpable lymphadenopathy  BREAST:Normal without mass, skin or nipple changes or axillary nodes,  LUNGS: Clear to auscultation, no crackles or wheezes HEART: Regular rate & rhythm, no murmurs, no gallops, S1 normal and S2 normal  ABDOMEN: Abdomen soft, non-tender, normal bowel sounds, no masses or organomegaly and no hepatosplenomegaly  MSK: No CVA tenderness and no tenderness on percussion of the back or rib cage. EXTREMITIES: No edema, no skin discoloration or tenderness NEURO: Alert & oriented, no focal motor/sensory deficits.  Lab Results  Component Value Date   WBC 7.0 08/01/2012   HGB 13.6 08/01/2012   HCT 40.8 08/01/2012   PLT 233 08/01/2012   GLUCOSE 91 08/01/2012   ALT <5 06/17/2012   AST 12 06/17/2012   NA 140 08/01/2012   K 4.1 08/01/2012   CL 102 07/08/2012   CREATININE 1.7* 08/01/2012   BUN 16.8 08/01/2012   CO2 26 08/01/2012     Assessment and Plan:  [name] is a pleasant 79 years old male with the current metastatic colon cancer was multiple medical comorbidities and poor performance status. Today in clinic with Justin Lang we had a along discussion about his disease, diagnosis, clinical status and clinic stage. We explained the disease to  him and we will inform the patient and his family dos since his stage IV and dose goal of any treatment would be  palliative goal and will not be a curative goal. Therefore, the risk and benefit of the treatments has to be weak it carefully. We went over the currently the FDA approved chemotherapeutic agents for the treatment of metastatic colon cancer we also went over the side effects in details. We explained to the patient and his family the she is Role of the EGFR inhibitors drugs in colon cancer and the concept K-ras mutation. He had more than an hour long discussion with [name] and his family about the pros and cons of a shooting systemic chemotherapy. I also performed them giving age, multiple comorbidities, performance status the active observation and supportive care with symptoms management is very reasonable approach. However, if patient is very interested in treatment and wanted to receive systemic chemotherapy, I would prefer using single agent with dose reduction at least for the first few cycles to make sure patient can tolerate treatment. My final recommendation was to test his tumor for the K-RAS mutation status, obtain  CT scan of the abdomen pelvis to have an idea about the extent of his disease and also to serve as a new baseline. If patient's tumor is k-ras wild-type my preference will be to use a single agent cetuximab or panitumumab giving the relatively low toxicity profile, in addition to the fact the patient did not receive such class of drug before. We again informed him that it is reasonable to focus on the quality of life and supportive care alone. However, if patient's tumor is K-RRS mutant type, I would recommend single agent capecitabine for the first two cycles, then we can consider adding Avastin if patient is able to tolerate treatment with no major side effects.  Patient and his family with take sometime to think about this options and he will return to clinic in 2 weeks to go over the CAT scan result as well as the KRAS mutation results and for the final treatment plan.    Patient  and  his family asked a series of questions which was answered hopefully to their staisfication.  The length of time of the  face-to-face encounter was 60 minutes. More than 50% of time was spent counseling and coordination of care.    e.Justin Dakins, MD 08/01/2012 3:59 PM

## 2012-08-01 NOTE — Telephone Encounter (Signed)
Called pathology, no KRAS mutation has been ordered on any colon pathology in local system, nor does it show on Care Everywhere at [facility]. Test ordered today, will be started on Monday 08/04/12. Dr Karel Jarvis aware.

## 2012-08-01 NOTE — Telephone Encounter (Deleted)
Progress note from [facility]

## 2012-08-01 NOTE — Progress Notes (Signed)
Checked in new patient. He has poa/living but not with him. No financial issues. Wants phone and mail only for communication.

## 2012-08-01 NOTE — Telephone Encounter (Signed)
erouneous

## 2012-08-04 ENCOUNTER — Encounter: Payer: Self-pay | Admitting: Pharmacist

## 2012-08-04 DIAGNOSIS — Z86718 Personal history of other venous thrombosis and embolism: Secondary | ICD-10-CM

## 2012-08-04 NOTE — Progress Notes (Unsigned)
New referral from Dr. Karel Jarvis to [facility] Coumadin clinic Dx: DVT w/ Greenfield filter Duration of Anticoag: lifelong Pt has been on Coumadin for 20+ years.  Previously managed by Dr. Mariel Sleet. Current dose is Warfarin 7 mg daily; this dose started ~ 07/28/12. Tablet strengths: 1 mg/2 mg/5 mg 1st visit to [facility] Coumadin clinic is this Fri 08/08/12.  He has appt for CT Scan at [facility] on Fri at 8:15 am so he will come over after the CT scan. Ebony Hail, Pharm.D., CPP 08/04/2012@11 :17 AM

## 2012-08-05 ENCOUNTER — Encounter (HOSPITAL_BASED_OUTPATIENT_CLINIC_OR_DEPARTMENT_OTHER): Payer: Medicare Other

## 2012-08-05 VITALS — BP 151/82 | HR 63 | Temp 96.9°F | Resp 20 | Wt 160.0 lb

## 2012-08-05 DIAGNOSIS — C189 Malignant neoplasm of colon, unspecified: Secondary | ICD-10-CM

## 2012-08-05 DIAGNOSIS — R21 Rash and other nonspecific skin eruption: Secondary | ICD-10-CM

## 2012-08-05 NOTE — Progress Notes (Signed)
[facility] Telephone:(336) [phone]   Fax:(336) [phone]  OFFICE PROGRESS NOTE  Catalina Pizza, MD  [address] [place] [address]  DIAGNOSIS: Metastatic  colon cancer  ONCOLOGIC HISTORY:please see detailed note by Dr. Pilar Jarvis  Dated 08/01/2012.  INTERVAL HISTORY:   Calyx Hawker. 79 y.o. male returns to the clinic today for scheduled follow up.  He had seen Dr. Pilar Jarvis and plans to continue his care at [facility] .  He tells me that he came today because he was scheduled to come . He is here with his wife.  Reports of a rash in the right forearm and was around the area he had a tape placed and this has a burning sensation.   MEDICAL HISTORY: Past Medical History  Diagnosis Date  . DVT (deep venous thrombosis)     hx of s/p Greenfield filter  . OAB (overactive bladder)   . Arthritis   . Cataracts, bilateral   . FH: factor V Leiden deficiency   . History of nephrolithiasis   . History of ankle fracture   . 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  . Peripheral neuropathy   . Vitamin B12 deficiency   . Symptomatic bradycardia   . Sinoatrial node dysfunction   . Sinoatrial node dysfunction   . Pacemaker 4 yrs ago  . Colon cancer 07/23/00    hx of metastic- treated with surgery and readiation  . Allergy     celebrex  . History of radiation therapy 09/02/00-10/07/00    recurrent colon cancer/left periaortic nodal recurrence=4500cGy/25 fxs,Dr.Wu  . Hx SBO     multiple intermittent and surgery  . History of chemotherapy     ALLERGIES:  is allergic to celecoxib.  MEDICATIONS:  Current Outpatient Prescriptions  Medication Sig Dispense Refill  . acetaminophen (TYLENOL) 500 MG tablet Take 500 mg by mouth every 6 (six) hours as needed for pain.       . diphenoxylate-atropine (LOMOTIL) 2.5-0.025 MG per tablet 1 tablet. Take 1 tablet by mouth 4 (four) times daily as needed for Diarrhea.      Marland Kitchen  oxyCODONE-acetaminophen (PERCOCET/ROXICET) 5-325 MG per tablet Take 1 tablet by mouth every 4 (four) hours as needed for pain.  20 tablet  0  . potassium chloride SA (K-DUR,KLOR-CON) 20 MEQ tablet 20 mEq daily.       Marland Kitchen warfarin (COUMADIN) 2 MG tablet       . warfarin (COUMADIN) 5 MG tablet TAKE AS DIRECTED  60 tablet  3  . cholestyramine (QUESTRAN) 4 G packet Take 1 packet by mouth as needed.       . promethazine (PHENERGAN) 12.5 MG tablet 12.5 mg. Take 12.5 mg by mouth every 6 (six) hours as needed for Nausea.      Marland Kitchen warfarin (COUMADIN) 1 MG tablet Take 1 tablet (1 mg total) by mouth as directed.  60 tablet  2   No current facility-administered medications for this visit.    SURGICAL HISTORY:  Past Surgical History  Procedure Laterality Date  . Colectomy      sigmoid secondary to colon ca- 1997  . Exploratory laparotomy      metastatic retroperitoneal lymph node- 2002  . Total knee arthroplasty      right-1991; left-1992  . Back surgeries      x3  . Filter replacement    . Left ankle surgery    .  Incisional herniorraphy    . Pacemaker insertion  05/19/2008  . Replacement total knee bilateral    . Colon resection    . Colonoscopy  08/18/2010    Procedure: COLONOSCOPY;  Surgeon: Malissa Hippo, MD;  [facility];  Service: Endoscopy;  Laterality: N/A;  . Esophagogastroduodenoscopy  08/18/2010    Procedure: ESOPHAGOGASTRODUODENOSCOPY (EGD);  Surgeon: Malissa Hippo, MD;  Location: [facility];  Service: Endoscopy;  Laterality: N/A;  . Partial colectomy  11/28/2011    Procedure: PARTIAL COLECTOMY;  Surgeon: Dalia Heading, MD;  Location: [facility];  Service: General;  Laterality: N/A;  . Laparoscopic ileocecectomy  11/28/11  . Appendectomy        PHYSICAL EXAMINATION:  Blood pressure 151/82, pulse 63, temperature 96.9 F (36.1 C), temperature source Oral, resp. rate 20, weight 160 lb (72.576 kg). GENERAL: No distress.Elderly gentleman. SKIN:  No rashes or significant  lesions .  Scaly erythematous rash involving did proximal right forearm.    LABORATORY DATA: Lab Results  Component Value Date   WBC 7.0 08/01/2012   HGB 13.6 08/01/2012   HCT 40.8 08/01/2012   MCV 90.0 08/01/2012   PLT 233 08/01/2012      Chemistry      Component Value Date/Time   NA 140 08/01/2012 1313   NA 139 07/08/2012 1100   K 4.1 08/01/2012 1313   K 3.9 07/08/2012 1100   CL 102 07/08/2012 1100   CO2 26 08/01/2012 1313   CO2 28 07/08/2012 1100   BUN 16.8 08/01/2012 1313   BUN 18 07/08/2012 1100   CREATININE 1.7* 08/01/2012 1313   CREATININE 1.69* 07/08/2012 1100      Component Value Date/Time   CALCIUM 9.0 08/01/2012 1313   CALCIUM 9.2 07/08/2012 1100   ALKPHOS 53 06/17/2012 1004   AST 12 06/17/2012 1004   ALT <5 06/17/2012 1004   BILITOT 0.5 06/17/2012 1004       RADIOGRAPHIC STUDIES: No results found.   ASSESSMENT:  Rash etiology is unknown, could be related to  tape allergy . I think that trial of topical 1% hydrocortisone is on is reasonable. For the metastatic colon cancer, his care will  be at  [facility] long as he desires.  I told him that he is welcome to talk to [place] anytime he desires otherwise we'll see him only on as necessary basis.  PLAN:  1. 1% topical hydrocortisone prescribed to be applied to the right distal right forearm . 2. Follow up here on as necessary basis. 3. Patient will follow up as outlined by the [facility]  team for the care of his cancer.     All questions were satisfactorily answered. Patient knows to call if  any concern arises.   Sherral Hammers, MD FACP. Hematology/Oncology.

## 2012-08-05 NOTE — Patient Instructions (Signed)
.  [facility] Discharge Instructions  RECOMMENDATIONS MADE BY THE CONSULTANT AND ANY TEST RESULTS WILL BE SENT TO YOUR REFERRING PHYSICIAN.  EXAM FINDINGS BY THE PHYSICIAN TODAY AND SIGNS OR SYMPTOMS TO REPORT TO CLINIC OR PRIMARY PHYSICIAN:  Dr. Sharia Reeve agrees with plan that has been discussed.  MEDICATIONS PRESCRIBED:  Hydrocortisone cream 1 % - You can buy over the counter at your drugstore. Apply to your arm twice a day.  SPECIAL INSTRUCTIONS/FOLLOW-UP: You can come back as needed.  Thank you for choosing [name] to provide your oncology and hematology care.  To afford each patient quality time with our providers, please arrive at least 15 minutes before your scheduled appointment time.  With your help, our goal is to use those 15 minutes to complete the necessary work-up to ensure our physicians have the information they need to help with your evaluation and healthcare recommendations.    Effective January 1st, 2014, we ask that you re-schedule your appointment with our physicians should you arrive 10 or more minutes late for your appointment.  We strive to give you quality time with our providers, and arriving late affects you and other patients whose appointments are after yours.    Again, thank you for choosing [facility].  Our hope is that these requests will decrease the amount of time that you wait before being seen by our physicians.       _____________________________________________________________  Should you have questions after your visit to [facility], please contact our office at [phone] between the hours of 8:30 a.m. and 5:00 p.m.  Voicemails left after 4:30 p.m. will not be returned until the following business day.  For prescription refill requests, have your pharmacy contact our office with your prescription refill request.

## 2012-08-08 ENCOUNTER — Other Ambulatory Visit (HOSPITAL_BASED_OUTPATIENT_CLINIC_OR_DEPARTMENT_OTHER): Payer: Medicare Other | Admitting: Lab

## 2012-08-08 ENCOUNTER — Encounter (HOSPITAL_COMMUNITY): Payer: Self-pay

## 2012-08-08 ENCOUNTER — Other Ambulatory Visit: Payer: Self-pay | Admitting: Hematology and Oncology

## 2012-08-08 ENCOUNTER — Ambulatory Visit (HOSPITAL_COMMUNITY)
Admission: RE | Admit: 2012-08-08 | Discharge: 2012-08-08 | Disposition: A | Discharge status: Home / Self Care | Payer: Medicare Other | Source: Ambulatory Visit | Attending: Hematology and Oncology | Admitting: Hematology and Oncology

## 2012-08-08 ENCOUNTER — Ambulatory Visit (HOSPITAL_BASED_OUTPATIENT_CLINIC_OR_DEPARTMENT_OTHER): Payer: Medicare Other | Admitting: Pharmacist

## 2012-08-08 DIAGNOSIS — N201 Calculus of ureter: Secondary | ICD-10-CM | POA: Insufficient documentation

## 2012-08-08 DIAGNOSIS — Z9049 Acquired absence of other specified parts of digestive tract: Secondary | ICD-10-CM | POA: Insufficient documentation

## 2012-08-08 DIAGNOSIS — Z86718 Personal history of other venous thrombosis and embolism: Secondary | ICD-10-CM

## 2012-08-08 DIAGNOSIS — I82409 Acute embolism and thrombosis of unspecified deep veins of unspecified lower extremity: Secondary | ICD-10-CM

## 2012-08-08 DIAGNOSIS — J90 Pleural effusion, not elsewhere classified: Secondary | ICD-10-CM | POA: Insufficient documentation

## 2012-08-08 DIAGNOSIS — K7689 Other specified diseases of liver: Secondary | ICD-10-CM | POA: Insufficient documentation

## 2012-08-08 DIAGNOSIS — C786 Secondary malignant neoplasm of retroperitoneum and peritoneum: Secondary | ICD-10-CM | POA: Insufficient documentation

## 2012-08-08 DIAGNOSIS — K6289 Other specified diseases of anus and rectum: Secondary | ICD-10-CM | POA: Insufficient documentation

## 2012-08-08 DIAGNOSIS — N200 Calculus of kidney: Secondary | ICD-10-CM | POA: Insufficient documentation

## 2012-08-08 DIAGNOSIS — K5730 Diverticulosis of large intestine without perforation or abscess without bleeding: Secondary | ICD-10-CM | POA: Insufficient documentation

## 2012-08-08 DIAGNOSIS — N210 Calculus in bladder: Secondary | ICD-10-CM | POA: Insufficient documentation

## 2012-08-08 DIAGNOSIS — C189 Malignant neoplasm of colon, unspecified: Secondary | ICD-10-CM

## 2012-08-08 LAB — PROTIME-INR

## 2012-08-08 NOTE — Progress Notes (Signed)
INR slightly above goal today. Patient is new to our coumadin clinic but has been on coumadin for 20+ years and had been under the care of Dr. Mariel Sleet. His  Diagnosis requiring anticoagulation was: DVT w/ Greenfield filter  Duration of Anticoag is lifelong. Pt reports no issues with the coumadin and is currently taking 7 mg daily. He has some bruising that is minor. No missed or extra doses. Since INR is elevated today will Hold coumadin today x 1. Then decrease dose to 7 mg daily except 5 mg on Fridays and Mondays starting tomorrow. Return 08/15/12 next week lab at 10am coumadin clinic 10:15am, and MD appointment at 10:30am

## 2012-08-08 NOTE — Patient Instructions (Addendum)
INR above goal today  No coumadin today  Decrease dose to 7 mg daily except for 5 mg on Mondays and Fridays  Return next week 08/15/12 for lab at 10am, coumadin clinic at 10:15am, and MD appointment at 10:30am

## 2012-08-11 ENCOUNTER — Other Ambulatory Visit (HOSPITAL_COMMUNITY): Payer: Medicare Other

## 2012-08-15 ENCOUNTER — Ambulatory Visit: Payer: Medicare Other | Admitting: Pharmacist

## 2012-08-15 ENCOUNTER — Other Ambulatory Visit: Payer: Self-pay

## 2012-08-15 ENCOUNTER — Other Ambulatory Visit (HOSPITAL_BASED_OUTPATIENT_CLINIC_OR_DEPARTMENT_OTHER): Payer: Medicare Other | Admitting: Lab

## 2012-08-15 ENCOUNTER — Ambulatory Visit (HOSPITAL_BASED_OUTPATIENT_CLINIC_OR_DEPARTMENT_OTHER): Payer: Medicare Other | Admitting: Hematology and Oncology

## 2012-08-15 VITALS — BP 151/67 | HR 64 | Temp 97.3°F | Resp 20 | Ht 66.0 in | Wt 161.3 lb

## 2012-08-15 DIAGNOSIS — C189 Malignant neoplasm of colon, unspecified: Secondary | ICD-10-CM

## 2012-08-15 DIAGNOSIS — Z86718 Personal history of other venous thrombosis and embolism: Secondary | ICD-10-CM

## 2012-08-15 DIAGNOSIS — L719 Rosacea, unspecified: Secondary | ICD-10-CM

## 2012-08-15 DIAGNOSIS — I82409 Acute embolism and thrombosis of unspecified deep veins of unspecified lower extremity: Secondary | ICD-10-CM

## 2012-08-15 DIAGNOSIS — C187 Malignant neoplasm of sigmoid colon: Secondary | ICD-10-CM

## 2012-08-15 DIAGNOSIS — R197 Diarrhea, unspecified: Secondary | ICD-10-CM

## 2012-08-15 DIAGNOSIS — R5383 Other fatigue: Secondary | ICD-10-CM

## 2012-08-15 DIAGNOSIS — Z5181 Encounter for therapeutic drug level monitoring: Secondary | ICD-10-CM

## 2012-08-15 DIAGNOSIS — Z7901 Long term (current) use of anticoagulants: Secondary | ICD-10-CM

## 2012-08-15 LAB — PROTIME-INR: INR: 2.6 (ref 2.00–3.50)

## 2012-08-15 LAB — POCT INR: INR: 2.6

## 2012-08-15 MED ORDER — METRONIDAZOLE 1 % EX GEL
Freq: Two times a day (BID) | CUTANEOUS | Status: DC | PRN
Start: 2012-08-15 — End: 2012-09-17

## 2012-08-15 MED ORDER — DOXYCYCLINE HYCLATE 100 MG PO CAPS: ORAL_CAPSULE | ORAL | Status: DC

## 2012-08-15 NOTE — Telephone Encounter (Signed)
gv and printed appt sched for pt...emailed Dr. Truett Perna for d/t and if he will c pt.

## 2012-08-15 NOTE — Progress Notes (Signed)
INR within goal today. No problems to report regarding anticoagulation. No bruising/bleeding. No s/s of clotting. Pt sees MD today to review scans from last week. No changes in diet, medications, etc. Vitamin D was added to medication list. Continue 7 mg daily except 5 mg on Fridays and Mondays.  Return in 2 weeks on 09/02/12: lab at 9:30 am and coumadin clinic at 9:45am.

## 2012-08-15 NOTE — Patient Instructions (Signed)
Continue 7 mg daily except 5 mg on Fridays and Mondays.  Return in 2 weeks on 09/02/12: lab at 9:30 am and coumadin clinic at 9:45am. Call us to let us know of any medications changes; [phone]

## 2012-08-15 NOTE — Progress Notes (Signed)
[facility] Telephone:(336) [phone]   Fax:(336) [phone]  OFFICE PROGRESS NOTE  Catalina Pizza, MD  [address] [place] [address]  DIAGNOSIS: Relapsed Wild-type Colon cancer   Oncological History:  We Mr. Edgington our clinic earlier 2 weeks ago  for consultation regarding his relapsed colon cancer. Mr. 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 Subsequently further treatment was aborted and patient did not finish the rest of his adjuvant. Subsequently in 2002 Mr. Levi had abdominal cancer (since he does not know what kind of cancer was) is for which he underwent an attempt with surgical resection at OSH and he was told that full resection would be too dangerous since  this mass was very close to the aorta, thus,  partial resection was performed and subsequently patient was transferred to [facility] to see Dr. Ladona Ridgel for biopsy of the paraortic lymph node which was done and came back as adenocarcinoma.  Most recently in November 2013 patient underwent surgical resection of the terminal ileum and cecal adenocarcinoma and pathology revealed appendiceal orifice/cecal adenocarcinoma T4 N0. Was found to have 2 peritoneal implants and was referred to do a for present arrhythmias and an implant biopsy which was done on 07/04/2012 and pathology revealed metastatic carcinoma compatible with colorectal cancer. Patient was seen by Dr. Reginia Naas and his recommendation was to proceed with systemic chemotherapy using capecitabine  plus bevacizumab as a palliative measure if patient is  interested to receive chemotherapy. Last clinic visit,  Mr. Robbins is accompanied by his wife and 2 sons and daughter-in-law.  With Breck Coons we had a along discussion about his disease, diagnosis, clinical status and clinic stage. We explained the disease to him and we will inform the patient and his family dos since his stage IV and dose goal of any treatment would be palliative goal and will not be a curative goal. Therefore, the risk and benefit of the treatments has to be weighted carefully. We went over the currently the FDA approved chemotherapeutic agents for the treatment of metastatic colon cancer we also went over the side effects in details.  We explained to the patient and his family the role of the EGFR inhibitors drugs in colon cancer and the concept of K-ras mutation and if his tumor is K-RAS wild type it might be reasonable to try xx as a single agent. Final plan was to check K-RAS status of his tumor and repeat his Ct scans as a new baseline.        INTERVAL HISTORY: Subhan Hoopes. 79 y.o. male returns to the clinic today for follow up and reassessment. No major changes or acute event since last vist. He is complaining of being tired, fatigued and diarrhea. Stated his appetite stable and his weight has been stable, denied abdominal pain denied nausea or vomiting, denied fever or chills. No numbness or tingling, denied any neurological deficit.  MEDICAL HISTORY: Past Medical History  Diagnosis Date  . DVT (deep venous thrombosis)     hx of s/p Greenfield filter  . OAB (overactive  bladder)   . Arthritis   . Cataracts, bilateral   . FH: factor V Leiden deficiency   . History of nephrolithiasis   . History of ankle fracture   . 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  . Peripheral neuropathy   . Vitamin B12 deficiency   . Symptomatic bradycardia   . Sinoatrial node dysfunction   . Sinoatrial node dysfunction   . Pacemaker 4 yrs ago  . Colon cancer  07/23/00    hx of metastic- treated with surgery and readiation  . Allergy     celebrex  . History of radiation therapy 09/02/00-10/07/00    recurrent colon cancer/left periaortic nodal recurrence=4500cGy/25 fxs,Dr.Wu  . Hx SBO     multiple intermittent and surgery  . History of chemotherapy     ALLERGIES:  is allergic to celecoxib.  MEDICATIONS:  Current Outpatient Prescriptions  Medication Sig Dispense Refill  . acetaminophen (TYLENOL) 500 MG tablet Take 500 mg by mouth every 6 (six) hours as needed for pain.       . cholecalciferol (VITAMIN D) 1000 UNITS tablet Take 1,000 Units by mouth daily.      . cholestyramine (QUESTRAN) 4 G packet Take 1 packet by mouth as needed.       . potassium chloride SA (K-DUR,KLOR-CON) 20 MEQ tablet 20 mEq daily.       Marland Kitchen warfarin (COUMADIN) 1 MG tablet Take 1 tablet (1 mg total) by mouth as directed.  60 tablet  2  . warfarin (COUMADIN) 2 MG tablet as directed.       . warfarin (COUMADIN) 5 MG tablet TAKE AS DIRECTED  60 tablet  3  . diphenoxylate-atropine (LOMOTIL) 2.5-0.025 MG per tablet 1 tablet. Take 1 tablet by mouth 4 (four) times daily as needed for Diarrhea.      . doxycycline (VIBRAMYCIN) 100 MG capsule Take 2 tablets on first day then 1 tablet daily  30 capsule  3  . metroNIDAZOLE (METROGEL) 1 % gel Apply topically 2 (two) times daily as needed. For rash on face  45 g  1  . oxyCODONE-acetaminophen (PERCOCET/ROXICET) 5-325 MG per tablet Take 1 tablet by mouth every 4 (four) hours as needed for pain.  20 tablet  0  . promethazine (PHENERGAN) 12.5 MG tablet 12.5 mg. Take 12.5 mg by mouth every 6 (six) hours as needed for Nausea.       No current facility-administered medications for this visit.    SURGICAL HISTORY:  Past Surgical History  Procedure Laterality Date  . Colectomy      sigmoid secondary to colon ca- 1997  . Exploratory laparotomy      metastatic retroperitoneal lymph node- 2002  . Total knee arthroplasty      right-1991;  left-1992  . Back surgeries      x3  . Filter replacement    . Left ankle surgery    . Incisional herniorraphy    . Pacemaker insertion  05/19/2008  . Replacement total knee bilateral    . Colon resection    . Colonoscopy  08/18/2010    Procedure: COLONOSCOPY;  Surgeon: Malissa Hippo, MD;  [facility];  Service: Endoscopy;  Laterality: N/A;  . Esophagogastroduodenoscopy  08/18/2010    Procedure: ESOPHAGOGASTRODUODENOSCOPY (EGD);  Surgeon: Malissa Hippo, MD;  [facility];  Service: Endoscopy;  Laterality: N/A;  . Partial colectomy  11/28/2011    Procedure: PARTIAL COLECTOMY;  Surgeon: Dalia Heading, MD;  Location:  [facility];  Service: General;  Laterality: N/A;  . Laparoscopic ileocecectomy  11/28/11  . Appendectomy      REVIEW OF SYSTEMS:  Pertinent items are noted in HPI.   PHYSICAL EXAMINATION:  GENERAL: elderly, lethargic.  No distress.  SKIN:  No rashes or significant lesions  HEAD: Normocephalic, No masses, lesions, tenderness or abnormalities  EYES: Conjunctiva are pink and non-injected  ENT: External ears normal ,lips , buccal mucosa, and tongue normal and mucous membranes are moist  LYMPH: No palpable lymphadenopathy  BREAST:Normal without mass, skin or nipple changes or axillary nodes,  LUNGS: Clear to auscultation, no crackles or wheezes HEART: Regular rate & rhythm, no murmurs, no gallops, S1 normal and S2 normal  ABDOMEN: Abdomen soft, non-tender, normal bowel sounds, no masses or organomegaly and no hepatosplenomegaly  MSK: No CVA tenderness and no tenderness on percussion of the back or rib cage. EXTREMITIES: No edema, no skin discoloration or tenderness NEURO: Alert & oriented, no focal motor/sensory deficits.  ECOG PERFORMANCE STATUS: 2 - Symptomatic, <50% confined to bed  Blood pressure 151/67, pulse 64, temperature 97.3 F (36.3 C), temperature source Oral, resp. rate 20, height 5\' 6"  (1.676 m), weight 161 lb 4.8 oz (73.165  kg).  LABORATORY DATA: Lab Results  Component Value Date   WBC 7.0 08/01/2012   HGB 13.6 08/01/2012   HCT 40.8 08/01/2012   MCV 90.0 08/01/2012   PLT 233 08/01/2012      Chemistry      Component Value Date/Time   NA 140 08/01/2012 1313   NA 139 07/08/2012 1100   K 4.1 08/01/2012 1313   K 3.9 07/08/2012 1100   CL 102 07/08/2012 1100   CO2 26 08/01/2012 1313   CO2 28 07/08/2012 1100   BUN 16.8 08/01/2012 1313   BUN 18 07/08/2012 1100   CREATININE 1.7* 08/01/2012 1313   CREATININE 1.69* 07/08/2012 1100      Component Value Date/Time   CALCIUM 9.0 08/01/2012 1313   CALCIUM 9.2 07/08/2012 1100   ALKPHOS 53 06/17/2012 1004   AST 12 06/17/2012 1004   ALT <5 06/17/2012 1004   BILITOT 0.5 06/17/2012 1004       RADIOGRAPHIC STUDIES: Ct Abdomen Pelvis Wo Contrast  08/08/2012   *RADIOLOGY REPORT*  Clinical Data:  Colon cancer, partial colectomy  CT CHEST, ABDOMEN AND PELVIS WITHOUT CONTRAST  Technique:  Multidetector CT imaging of the chest, abdomen and pelvis was performed following the standard protocol without IV contrast.  Comparison:  PET CT 06/02/2012  CT CHEST  Findings:  No axillary or supraclavicular lymphadenopathy.  No mediastinal or hilar lymphadenopathy.  No pericardial fluid.  Trace pericardial fluid.  Review of the lung parenchyma demonstrates small bilateral pleural effusions.  There is small focus of ill- defined air space disease in the medial left lower lobe (image 39).  IMPRESSION: 1.   No evidence of thoracic metastasis.  2.   Small focus of probable atelectasis or infection at  the medial left lung base.  Recommend attention on follow-up.  CT ABDOMEN AND PELVIS  Findings:    Non-IV contrast images demonstrates a simple cyst in the right hepatic lobe.  Post cholecystectomy.  The pancreas, spleen, adrenal glands, adrenal glands are normal.  There is a calcification within the left renal pelvis measuring 6 mm which is nonobstructive.  SUV 4 mm right lower pole calculus.  The stomach, some of  small bowel are normal.  There is a partial right hemicolectomy. There is a second anastomosis  in the descending colon.  There is no evidence of nodularity obstruction at the anastomoses.  There are diverticula of the sigmoid colon without acute inflammation.  There is some thickening of the anterior wall of the distal rectum to 17 mm (image 107 this was not significant hypermetabolic on comparison PET CT scan.  No retroperitoneal lymphadenopathy.  No pelvic lymphadenopathy. Review of  bone windows demonstrates no aggressive osseous lesions.  Prostate gland bladder are normal. There is a large calcification the posterior right  bladder measuring 15 mm.  IVC filter noted.  Examination of the peritoneal space demonstrates a nodule along the right pericolic gutter measuring 7 mm x 7 mm not changed from 10 mm x 6 mm on prior (image 69).  Nodule in the right iliac fossa measures 16 x 28 mm (image 87) which measures slightly larger than 15 x 14 mm on prior.  No clear evidence of new peritoneal metastasis.  The  IMPRESSION:  1. Stable to slightly enlarged peritoneal metastasis.  No new lesions evident.  2. Stable postsurgical change in the right left colon.  3.  Renal calculi, left ureteral pelvic calculus, and bladder calculus are unchanged. 4.  Thickening of the distal rectum was not hypermetabolic on comparison PET CT scan.   Original Report Authenticated By: Genevive Bi, M.D.   Ct Chest Wo Contrast  08/08/2012   *RADIOLOGY REPORT*  Clinical Data:  Colon cancer, partial colectomy  CT CHEST, ABDOMEN AND PELVIS WITHOUT CONTRAST  Technique:  Multidetector CT imaging of the chest, abdomen and pelvis was performed following the standard protocol without IV contrast.  Comparison:  PET CT 06/02/2012  CT CHEST  Findings:  No axillary or supraclavicular lymphadenopathy.  No mediastinal or hilar lymphadenopathy.  No pericardial fluid.  Trace pericardial fluid.  Review of the lung parenchyma demonstrates small bilateral  pleural effusions.  There is small focus of ill- defined air space disease in the medial left lower lobe (image 39).  IMPRESSION: 1.   No evidence of thoracic metastasis.  2.   Small focus of probable atelectasis or infection at  the medial left lung base.  Recommend attention on follow-up.  CT ABDOMEN AND PELVIS  Findings:    Non-IV contrast images demonstrates a simple cyst in the right hepatic lobe.  Post cholecystectomy.  The pancreas, spleen, adrenal glands, adrenal glands are normal.  There is a calcification within the left renal pelvis measuring 6 mm which is nonobstructive.  SUV 4 mm right lower pole calculus.  The stomach, some of small bowel are normal.  There is a partial right hemicolectomy. There is a second anastomosis in the descending colon.  There is no evidence of nodularity obstruction at the anastomoses.  There are diverticula of the sigmoid colon without acute inflammation.  There is some thickening of the anterior wall of the distal rectum to 17 mm (image 107 this was not significant hypermetabolic on comparison PET CT scan.  No retroperitoneal lymphadenopathy.  No pelvic lymphadenopathy. Review of  bone windows demonstrates no aggressive osseous lesions.  Prostate gland bladder are normal. There is a large calcification the posterior right  bladder measuring 15 mm.  IVC filter noted.  Examination of the peritoneal space demonstrates a nodule along the right pericolic gutter measuring 7 mm x 7 mm not changed from 10 mm x 6 mm on prior (image 69).  Nodule in the right iliac fossa measures 16 x 28 mm (image 87) which measures slightly larger than 15 x 14 mm on  prior.  No clear evidence of new peritoneal metastasis.  The  IMPRESSION:  1. Stable to slightly enlarged peritoneal metastasis.  No new lesions evident.  2. Stable postsurgical change in the right left colon.  3.  Renal calculi, left ureteral pelvic calculus, and bladder calculus are unchanged. 4.  Thickening of the distal rectum was not  hypermetabolic on comparison PET CT scan.   Original Report Authenticated By: Genevive Bi, M.D.    ASSESSMENT AND PLAN:   Mr. Anthonette Legato is a pleasant 79 years old male with the current metastatic colon cancer was multiple medical comorbidities and poor performance status s/p multiple lines of treatment. Today in clinic we informed Mr. IRBY FAILS that his tumor is K-RAS wild type and therefore, it is not unreasonable to try single agent Panitumumab (Vectibix). I went over the AEs and patient was given a written lituterure about the drug. i also went over the Ct scan result.  Plan to start Vectibix next week,  treatment to be given every 2 weeks, close monitoring of electrolytes especially magnesium, patient was given a prescription for doxycycline and MetroGel. Patient was scheduled to attend chemotherapy class. He will return to clinic one week after the initiation of treatment for toxicity check. Patient was also warned about diarrhea as a side effect.  All questions were answered. The patient knows to call the clinic with any problems, questions or concerns. We can certainly see the patient much sooner if necessary.    Zachery Dakins, MD 08/01/20143:39 PM

## 2012-08-18 ENCOUNTER — Telehealth: Payer: Self-pay | Admitting: Hematology and Oncology

## 2012-08-18 NOTE — Telephone Encounter (Signed)
s.w. with pt wife per pt request and advised on new time for 8.11.14 appt...ok and aware

## 2012-08-19 ENCOUNTER — Other Ambulatory Visit: Payer: Self-pay

## 2012-08-19 ENCOUNTER — Other Ambulatory Visit: Payer: Medicare Other

## 2012-08-19 ENCOUNTER — Encounter: Payer: Self-pay | Admitting: *Deleted

## 2012-08-19 DIAGNOSIS — C189 Malignant neoplasm of colon, unspecified: Secondary | ICD-10-CM

## 2012-08-20 ENCOUNTER — Other Ambulatory Visit: Payer: Medicare Other

## 2012-08-20 ENCOUNTER — Other Ambulatory Visit (HOSPITAL_BASED_OUTPATIENT_CLINIC_OR_DEPARTMENT_OTHER): Payer: Medicare Other | Admitting: Lab

## 2012-08-20 ENCOUNTER — Encounter (HOSPITAL_COMMUNITY): Payer: Self-pay | Admitting: *Deleted

## 2012-08-20 ENCOUNTER — Telehealth: Payer: Self-pay | Admitting: Oncology

## 2012-08-20 ENCOUNTER — Emergency Department (HOSPITAL_COMMUNITY): Payer: Medicare Other

## 2012-08-20 ENCOUNTER — Other Ambulatory Visit: Payer: Self-pay | Admitting: Hematology and Oncology

## 2012-08-20 ENCOUNTER — Ambulatory Visit (HOSPITAL_BASED_OUTPATIENT_CLINIC_OR_DEPARTMENT_OTHER): Payer: Medicare Other

## 2012-08-20 ENCOUNTER — Ambulatory Visit: Payer: Self-pay | Admitting: Pharmacist

## 2012-08-20 ENCOUNTER — Other Ambulatory Visit: Payer: Self-pay

## 2012-08-20 ENCOUNTER — Emergency Department (HOSPITAL_COMMUNITY)
Admission: EM | Admit: 2012-08-20 | Discharge: 2012-08-20 | Disposition: A | Discharge status: Home / Self Care | Payer: Medicare Other | Attending: Emergency Medicine | Admitting: Emergency Medicine

## 2012-08-20 DIAGNOSIS — C189 Malignant neoplasm of colon, unspecified: Secondary | ICD-10-CM

## 2012-08-20 DIAGNOSIS — Z862 Personal history of diseases of the blood and blood-forming organs and certain disorders involving the immune mechanism: Secondary | ICD-10-CM | POA: Insufficient documentation

## 2012-08-20 DIAGNOSIS — Z8669 Personal history of other diseases of the nervous system and sense organs: Secondary | ICD-10-CM | POA: Insufficient documentation

## 2012-08-20 DIAGNOSIS — Z8679 Personal history of other diseases of the circulatory system: Secondary | ICD-10-CM | POA: Insufficient documentation

## 2012-08-20 DIAGNOSIS — L299 Pruritus, unspecified: Secondary | ICD-10-CM

## 2012-08-20 DIAGNOSIS — Z923 Personal history of irradiation: Secondary | ICD-10-CM | POA: Insufficient documentation

## 2012-08-20 DIAGNOSIS — T451X5A Adverse effect of antineoplastic and immunosuppressive drugs, initial encounter: Secondary | ICD-10-CM | POA: Insufficient documentation

## 2012-08-20 DIAGNOSIS — R209 Unspecified disturbances of skin sensation: Secondary | ICD-10-CM | POA: Insufficient documentation

## 2012-08-20 DIAGNOSIS — T50995A Adverse effect of other drugs, medicaments and biological substances, initial encounter: Secondary | ICD-10-CM | POA: Insufficient documentation

## 2012-08-20 DIAGNOSIS — T50905A Adverse effect of unspecified drugs, medicaments and biological substances, initial encounter: Secondary | ICD-10-CM

## 2012-08-20 DIAGNOSIS — Z9889 Other specified postprocedural states: Secondary | ICD-10-CM | POA: Insufficient documentation

## 2012-08-20 DIAGNOSIS — Z7901 Long term (current) use of anticoagulants: Secondary | ICD-10-CM | POA: Insufficient documentation

## 2012-08-20 DIAGNOSIS — R498 Other voice and resonance disorders: Secondary | ICD-10-CM | POA: Insufficient documentation

## 2012-08-20 DIAGNOSIS — Z8719 Personal history of other diseases of the digestive system: Secondary | ICD-10-CM | POA: Insufficient documentation

## 2012-08-20 DIAGNOSIS — Z87442 Personal history of urinary calculi: Secondary | ICD-10-CM | POA: Insufficient documentation

## 2012-08-20 DIAGNOSIS — I82409 Acute embolism and thrombosis of unspecified deep veins of unspecified lower extremity: Secondary | ICD-10-CM

## 2012-08-20 DIAGNOSIS — Z5112 Encounter for antineoplastic immunotherapy: Secondary | ICD-10-CM

## 2012-08-20 DIAGNOSIS — Z8739 Personal history of other diseases of the musculoskeletal system and connective tissue: Secondary | ICD-10-CM | POA: Insufficient documentation

## 2012-08-20 DIAGNOSIS — Z8781 Personal history of (healed) traumatic fracture: Secondary | ICD-10-CM | POA: Insufficient documentation

## 2012-08-20 DIAGNOSIS — R490 Dysphonia: Secondary | ICD-10-CM | POA: Insufficient documentation

## 2012-08-20 DIAGNOSIS — Z87448 Personal history of other diseases of urinary system: Secondary | ICD-10-CM | POA: Insufficient documentation

## 2012-08-20 DIAGNOSIS — Z950 Presence of cardiac pacemaker: Secondary | ICD-10-CM | POA: Insufficient documentation

## 2012-08-20 DIAGNOSIS — Z79899 Other long term (current) drug therapy: Secondary | ICD-10-CM | POA: Insufficient documentation

## 2012-08-20 DIAGNOSIS — Z86718 Personal history of other venous thrombosis and embolism: Secondary | ICD-10-CM

## 2012-08-20 DIAGNOSIS — Z9221 Personal history of antineoplastic chemotherapy: Secondary | ICD-10-CM | POA: Insufficient documentation

## 2012-08-20 DIAGNOSIS — Z85038 Personal history of other malignant neoplasm of large intestine: Secondary | ICD-10-CM

## 2012-08-20 DIAGNOSIS — Z96659 Presence of unspecified artificial knee joint: Secondary | ICD-10-CM | POA: Insufficient documentation

## 2012-08-20 DIAGNOSIS — Z9849 Cataract extraction status, unspecified eye: Secondary | ICD-10-CM | POA: Insufficient documentation

## 2012-08-20 LAB — COMPREHENSIVE METABOLIC PANEL
ALT: 7 U/L (ref 0–53)
BUN: 17 mg/dL (ref 6–23)
CO2: 26 mEq/L (ref 19–32)
Calcium: 8.9 mg/dL (ref 8.4–10.5)
Creatinine, Ser: 1.61 mg/dL — ABNORMAL HIGH (ref 0.50–1.35)
GFR calc Af Amer: 43 mL/min — ABNORMAL LOW (ref >90)
GFR calc non Af Amer: 37 mL/min — ABNORMAL LOW (ref >90)
Glucose, Bld: 109 mg/dL — ABNORMAL HIGH (ref 70–99)

## 2012-08-20 LAB — URINALYSIS, ROUTINE W REFLEX MICROSCOPIC
Bilirubin Urine: NEGATIVE
Glucose, UA: NEGATIVE mg/dL
Ketones, ur: NEGATIVE mg/dL
Protein, ur: NEGATIVE mg/dL

## 2012-08-20 LAB — CBC WITH DIFFERENTIAL/PLATELET
Basophils Absolute: 0 10*3/uL (ref 0.0–0.1)
EOS%: 9.2 % — ABNORMAL HIGH (ref 0.0–7.0)
Eosinophils Absolute: 0.6 10*3/uL — ABNORMAL HIGH (ref 0.0–0.5)
Eosinophils Relative: 10 % — ABNORMAL HIGH (ref 0–5)
HCT: 42.2 % (ref 39.0–52.0)
HGB: 13.2 g/dL (ref 13.0–17.1)
Lymphocytes Relative: 20 % (ref 12–46)
Lymphs Abs: 1.4 10*3/uL (ref 0.7–4.0)
MCH: 30.1 pg (ref 26.0–34.0)
MCV: 90.2 fL (ref 79.3–98.0)
MCV: 90.8 fL (ref 78.0–100.0)
MONO%: 8.7 % (ref 0.0–14.0)
Monocytes Absolute: 0.5 10*3/uL (ref 0.1–1.0)
Monocytes Relative: 7 % (ref 3–12)
NEUT#: 3.8 10*3/uL (ref 1.5–6.5)
RBC: 4.38 10*6/uL (ref 4.20–5.82)
RBC: 4.65 MIL/uL (ref 4.22–5.81)
RDW: 15.5 % — ABNORMAL HIGH (ref 11.0–14.6)
WBC: 6.1 10*3/uL (ref 4.0–10.3)
WBC: 7.2 10*3/uL (ref 4.0–10.5)
lymph#: 1.2 10*3/uL (ref 0.9–3.3)

## 2012-08-20 LAB — COMPREHENSIVE METABOLIC PANEL (CC13)
AST: 11 U/L (ref 5–34)
Albumin: 2.8 g/dL — ABNORMAL LOW (ref 3.5–5.0)
Alkaline Phosphatase: 54 U/L (ref 40–150)
Calcium: 9.1 mg/dL (ref 8.4–10.4)
Chloride: 109 mEq/L (ref 98–109)
Glucose: 99 mg/dl (ref 70–140)
Potassium: 4.3 mEq/L (ref 3.5–5.1)
Sodium: 141 mEq/L (ref 136–145)
Total Protein: 6.6 g/dL (ref 6.4–8.3)

## 2012-08-20 LAB — TROPONIN I: Troponin I: <0.3 ng/mL (ref <0.30)

## 2012-08-20 LAB — PROTIME-INR
INR: 2.21 — ABNORMAL HIGH (ref 0.00–1.49)
Prothrombin Time: 23.8 seconds — ABNORMAL HIGH (ref 11.6–15.2)

## 2012-08-20 MED ORDER — PREDNISONE 20 MG PO TABS: 40.0000 mg | ORAL_TABLET | Freq: Every day | ORAL | Status: DC

## 2012-08-20 MED ORDER — SODIUM CHLORIDE 0.9 % IV SOLN
6.0000 mg/kg | Freq: Once | INTRAVENOUS | Status: AC
  Administered 2012-08-20: 440 mg via INTRAVENOUS
  Filled 2012-08-20: qty 22

## 2012-08-20 MED ORDER — ASPIRIN 325 MG PO TABS: 325.0000 mg | ORAL_TABLET | Freq: Every day | ORAL | Status: DC

## 2012-08-20 MED ORDER — METHYLPREDNISOLONE SODIUM SUCC 125 MG IJ SOLR
125.0000 mg | Freq: Once | INTRAMUSCULAR | Status: AC | PRN
  Administered 2012-08-20: 125 mg via INTRAVENOUS

## 2012-08-20 MED ORDER — DIPHENHYDRAMINE HCL 25 MG PO CAPS: 25.0000 mg | ORAL_CAPSULE | Freq: Four times a day (QID) | ORAL | Status: DC | PRN

## 2012-08-20 MED ORDER — SODIUM CHLORIDE 0.9 % IV SOLN
Freq: Once | INTRAVENOUS | Status: AC
  Administered 2012-08-20: 12:00:00 via INTRAVENOUS

## 2012-08-20 MED ORDER — DIPHENHYDRAMINE HCL 50 MG/ML IJ SOLN: 25.0000 mg | Freq: Once | INTRAMUSCULAR | Status: DC | PRN

## 2012-08-20 MED ORDER — DIPHENHYDRAMINE HCL 50 MG/ML IJ SOLN
50.0000 mg | Freq: Once | INTRAMUSCULAR | Status: AC
  Administered 2012-08-20: 50 mg via INTRAVENOUS

## 2012-08-20 NOTE — ED Notes (Signed)
ZOX:WRUE<AV> Expected date:<BR> Expected time:<BR> Means of arrival:<BR> Comments:<BR> Possible stroke

## 2012-08-20 NOTE — Patient Instructions (Signed)
Continue 7 mg daily except 5 mg on Fridays and Mondays.  Return in 1 week on 08/25/12: lab at 9:30 am, coumadin clinic at 9:45am and MD visit at 10:30am.

## 2012-08-20 NOTE — Patient Instructions (Signed)
Panitumumab Solution for Injection What is this medicine? PANITUMUMAB (pan i TOOM ue mab) is a chemotherapy drug. It targets a specific protein within cancer cells and stops the cells from growing. It is used to treat colorectal cancer. This medicine may be used for other purposes; ask your health care provider or pharmacist if you have questions. What should I tell my health care provider before I take this medicine? They need to know if you have any of these conditions: -lung disease, especially lung fibrosis -skin conditions or sensitivity -an unusual or allergic reaction to panitumumab, mouse proteins, other medicines, foods, dyes, or preservatives -pregnant or trying to get pregnant -breast-feeding How should I use this medicine? This drug is given as an infusion into a vein. It is administered in a hospital or clinic by a specially trained health care professional. Talk to your pediatrician regarding the use of this medicine in children. Special care may be needed. Overdosage: If you think you have taken too much of this medicine contact a poison control center or emergency room at once. NOTE: This medicine is only for you. Do not share this medicine with others. What if I miss a dose? It is important not to miss your dose. Call your doctor or health care professional if you are unable to keep an appointment. What may interact with this medicine? -some medicines for cancer This list may not describe all possible interactions. Give your health care provider a list of all the medicines, herbs, non-prescription drugs, or dietary supplements you use. Also tell them if you smoke, drink alcohol, or use illegal drugs. Some items may interact with your medicine. What should I watch for while using this medicine? Visit your doctor for checks on your progress. This drug may make you feel generally unwell. This is not uncommon, as chemotherapy can affect healthy cells as well as cancer cells. Report  any side effects. Continue your course of treatment even though you feel ill unless your doctor tells you to stop. This medicine can make you more sensitive to the sun. Keep out of the sun while receiving this medicine and for 2 months after the last dose. If you cannot avoid being in the sun, wear protective clothing and use sunscreen. Do not use sun lamps or tanning beds/booths. In some cases, you may be given additional medicines to help with side effects. Follow all directions for their use. Call your doctor or health care professional for advice if you get a fever, chills or sore throat, or other symptoms of a cold or flu. Do not treat yourself. This drug decreases your body's ability to fight infections. Try to avoid being around people who are sick. Avoid taking products that contain aspirin, acetaminophen, ibuprofen, naproxen, or ketoprofen unless instructed by your doctor. These medicines may hide a fever. Do not become pregnant while taking this medicine and for 6 months after the last dose. Women should inform their doctor if they wish to become pregnant or think they might be pregnant. Men should not father a child while taking this medicine and for 6 months after the last dose. There is a potential for serious side effects to an unborn child. Talk to your health care professional or pharmacist for more information. Do not breast-feed an infant while taking this medicine. What side effects may I notice from receiving this medicine? Side effects that you should report to your doctor or health care professional as soon as possible: -allergic reactions like skin rash, itching or  hives, swelling of the face, lips, or tongue -breathing problems -changes in vision -fast, irregular heartbeat -feeling faint or lightheaded, falls -fever, chills -mouth sores -swelling of the ankles, feet, hands -unusually weak or tired Side effects that usually do not require medical attention (report to your  doctor or health care professional if they continue or are bothersome): -changes in skin like acne, cracks, skin dryness -constipation -diarrhea -eyelash growth -headache -nail changes -nausea, vomiting -stomach upset This list may not describe all possible side effects. Call your doctor for medical advice about side effects. You may report side effects to FDA at 1-800-FDA-1088. Where should I keep my medicine? This drug is given in a hospital or clinic and will not be stored at home. NOTE: This sheet is a summary. It may not cover all possible information. If you have questions about this medicine, talk to your doctor, pharmacist, or health care provider.  2013, [employer]. (05/30/2010 11:00:12 AM)

## 2012-08-20 NOTE — Telephone Encounter (Signed)
, °

## 2012-08-20 NOTE — ED Notes (Signed)
Patient transported to CT 

## 2012-08-20 NOTE — ED Provider Notes (Signed)
CSN: [id number]     Arrival date & time 08/20/12  1530 History     First MD Initiated Contact with Patient 08/20/12 1530     Chief Complaint  Patient presents with  . cancer pt, r/o stroke    (Consider location/radiation/quality/duration/timing/severity/associated sxs/prior Treatment) HPI Pt was getting infusion of chemo drug today in cancer center for colon ca and began to develop lip tingling, sensation of throat swelling and difficulty speaking. Pt given IV solumedrol and benadryl. Pt symptoms have since improved with normal speech and no SOB. Pt has had similar reaction to a drug in the past. No focal weakness, numbness, or visual changes. Pt states he feels at his baseline.  Past Medical History  Diagnosis Date  . DVT (deep venous thrombosis)     hx of s/p [place] filter  . OAB (overactive bladder)   . Arthritis   . Cataracts, bilateral   . FH: factor V Leiden deficiency   . History of nephrolithiasis   . History of ankle fracture   . 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  . Peripheral neuropathy   . Vitamin B12 deficiency   . Symptomatic bradycardia   . Sinoatrial node dysfunction   . Sinoatrial node dysfunction   . Pacemaker 4 yrs ago  . Colon cancer 07/23/00    hx of metastic- treated with surgery and readiation  . Allergy     celebrex  . History of radiation therapy 09/02/00-10/07/00    recurrent colon cancer/left periaortic nodal recurrence=4500cGy/25 fxs,Dr.Wu  . Hx SBO     multiple intermittent and surgery  . History of chemotherapy    Past Surgical History  Procedure Laterality Date  . Colectomy      sigmoid secondary to colon ca- 1997  . Exploratory laparotomy      metastatic retroperitoneal lymph node- 2002  . Total knee arthroplasty      right-1991; left-1992  . Back surgeries      x3  . Filter replacement    . Left ankle surgery    . Incisional herniorraphy    . Pacemaker insertion  05/19/2008  . Replacement total knee bilateral    .  Colon resection    . Colonoscopy  08/18/2010    Procedure: COLONOSCOPY;  Surgeon: Malissa Hippo, MD;  Location: [facility];  Service: Endoscopy;  Laterality: N/A;  . Esophagogastroduodenoscopy  08/18/2010    Procedure: ESOPHAGOGASTRODUODENOSCOPY (EGD);  Surgeon: Malissa Hippo, MD;  Location: [facility];  Service: Endoscopy;  Laterality: N/A;  . Partial colectomy  11/28/2011    Procedure: PARTIAL COLECTOMY;  Surgeon: Dalia Heading, MD;  Location: [facility];  Service: General;  Laterality: N/A;  . Laparoscopic ileocecectomy  11/28/11  . Appendectomy     Family History  Problem Relation Age of Onset  . Anesthesia problems Neg Hx   . Hypotension Neg Hx   . Malignant hyperthermia Neg Hx   . Pseudochol deficiency Neg Hx   . Cancer Sister    History  Substance Use Topics  . Smoking status: Never Smoker   . Smokeless tobacco: Never Used  . Alcohol Use: No    Review of Systems  Constitutional: Negative for fever and chills.  HENT: Positive for voice change. Negative for neck stiffness.   Eyes: Negative for visual disturbance.  Respiratory: Negative for shortness of breath and wheezing.   Cardiovascular: Negative for chest pain and palpitations.  Gastrointestinal: Negative for nausea, vomiting, abdominal pain and diarrhea.  Musculoskeletal: Negative for myalgias and back pain.  Skin: Negative for pallor, rash and wound.  Neurological: Positive for speech difficulty. Negative for dizziness, tremors, facial asymmetry, weakness, light-headedness, numbness and headaches.  All other systems reviewed and are negative.    Allergies  Celecoxib  Home Medications   Current Outpatient Rx  Name  Route  Sig  Dispense  Refill  . cholecalciferol (VITAMIN D) 1000 UNITS tablet   Oral   Take 1,000 Units by mouth at bedtime.          . diphenoxylate-atropine (LOMOTIL) 2.5-0.025 MG per tablet   Oral   Take 1 tablet by mouth 4 (four) times daily as needed for diarrhea or loose stools.           Marland Kitchen doxycycline (VIBRA-TABS) 100 MG tablet   Oral   Take 100 mg by mouth at bedtime.         Marland Kitchen oxyCODONE-acetaminophen (PERCOCET/ROXICET) 5-325 MG per tablet   Oral   Take 1 tablet by mouth every 4 (four) hours as needed for pain.   20 tablet   0   . potassium chloride SA (K-DUR,KLOR-CON) 20 MEQ tablet   Oral   Take 20 mEq by mouth daily.          Marland Kitchen PRESCRIPTION MEDICATION   Intravenous   Inject into the vein every 14 (fourteen) days. Vectibix infusion every 2 weeks.         . promethazine (PHENERGAN) 12.5 MG tablet   Oral   Take 12.5 mg by mouth every 6 (six) hours as needed for nausea.          Marland Kitchen warfarin (COUMADIN) 2 MG tablet   Oral   Take 2 mg by mouth as directed. Taken with 5mg  tablet daily except for Mondays and Fridays.         Marland Kitchen warfarin (COUMADIN) 5 MG tablet   Oral   Take 5 mg by mouth every evening.         . diphenhydrAMINE (BENADRYL) 25 mg capsule   Oral   Take 1 capsule (25 mg total) by mouth every 6 (six) hours as needed for itching.   30 capsule   0   . metroNIDAZOLE (METROGEL) 1 % gel   Topical   Apply topically 2 (two) times daily as needed. For rash on face   45 g   1   . predniSONE (DELTASONE) 20 MG tablet   Oral   Take 2 tablets (40 mg total) by mouth daily.   10 tablet   0    BP 164/67  Pulse 36  Temp(Src) 98.6 F (37 C) (Oral)  Resp 15  SpO2 95% Physical Exam  Nursing note and vitals reviewed. Constitutional: He is oriented to person, place, and time. He appears well-developed and well-nourished. No distress.  HENT:  Head: Normocephalic and atraumatic.  Mouth/Throat: Oropharynx is clear and moist. No oropharyngeal exudate.  Eyes: EOM are normal. Pupils are equal, round, and reactive to light.  Neck: Normal range of motion. Neck supple.  Cardiovascular: Normal rate and regular rhythm.   Pulmonary/Chest: Effort normal and breath sounds normal. No respiratory distress. He has no wheezes. He has no rales. He  exhibits no tenderness.  Abdominal: Soft. Bowel sounds are normal. He exhibits no distension and no mass. There is no tenderness. There is no rebound and no guarding.  Musculoskeletal: Normal range of motion. He exhibits no edema and no tenderness.  Neurological: He is alert and oriented to person,  place, and time.  Clear speech, 5/5 motor in bl upper ext. 5/5 motor RLE, 4/5 motor LLE (per patient this is unchanged from baseline), sensation intact.  Skin: Skin is warm and dry. No rash noted. No erythema.  Psychiatric: He has a normal mood and affect. His behavior is normal.    ED Course   Procedures (including critical care time)  Labs Reviewed  CBC WITH DIFFERENTIAL - Abnormal; Notable for the following:    Eosinophils Relative 10 (*)    All other components within normal limits  COMPREHENSIVE METABOLIC PANEL - Abnormal; Notable for the following:    Glucose, Bld 109 (*)    Creatinine, Ser 1.61 (*)    Albumin 3.0 (*)    GFR calc non Af Amer 37 (*)    GFR calc Af Amer 43 (*)    All other components within normal limits  PROTIME-INR - Abnormal; Notable for the following:    Prothrombin Time 23.8 (*)    INR 2.21 (*)    All other components within normal limits  APTT - Abnormal; Notable for the following:    aPTT 42 (*)    All other components within normal limits  URINALYSIS, ROUTINE W REFLEX MICROSCOPIC - Abnormal; Notable for the following:    Hgb urine dipstick TRACE (*)    All other components within normal limits  GLUCOSE, CAPILLARY - Abnormal; Notable for the following:    Glucose-Capillary 102 (*)    All other components within normal limits  TROPONIN I  URINE MICROSCOPIC-ADD ON   Ct Head Wo Contrast  08/20/2012   *RADIOLOGY REPORT*  Clinical Data: Colon cancer, chemotherapy reaction, leg heaviness with difficulty speaking  CT HEAD WITHOUT CONTRAST  Technique:  Contiguous axial images were obtained from the base of the skull through the vertex without contrast.   Comparison: 02/16/2011  Findings: No evidence of parenchymal hemorrhage or extra-axial fluid collection. No mass lesion, mass effect, or midline shift.  No CT evidence of acute infarction.  Subcortical white matter and periventricular small vessel ischemic changes.  Intracranial atherosclerosis.  Global cortical atrophy.  Suspected focal atrophy involving the left medial temporal lobe/hippocampus.  No ventriculomegaly.  The visualized paranasal sinuses are essentially clear. The mastoid air cells are unopacified.  No evidence of calvarial fracture.  IMPRESSION: No evidence of acute intracranial abnormality.  Atrophy with small vessel ischemic changes.   Original Report Authenticated By: Charline Bills, M.D.   Dg Chest Port 1 View  08/20/2012   *RADIOLOGY REPORT*  Clinical Data: Altered mental status and weakness  PORTABLE CHEST - 1 VIEW  Comparison: 06/17/2012  Findings: Cardiac shadow is stable.  A pacing device is again seen. The lungs are well aerated without focal infiltrate.  Mild interstitial changes are again noted.  IMPRESSION: No acute abnormality noted.   Original Report Authenticated By: Alcide Clever, M.D.   1. Medication reaction, initial encounter     MDM  Pt symptoms have completely resolved. Will observe in in ED and d/c home if remains asymptomatic. Symptoms likely reaction to chemotherapeutic agent.   Pt remains asymptomatic x 3 hours in ED. Will d/c home.   Loren Racer, MD 08/20/12 2215

## 2012-08-20 NOTE — Progress Notes (Signed)
INR within goal today. CBC is wnl today. Pt seen infusion area during first Vectibix treatment. Pt started Doxycycline on 08/17/12 in preparation for starting Vectibix. Doxycycline has the potential to increase the INR. Pt aware. Pt also on MetroGel PRN. No missed doses. No other changes to report.  No problems regarding anticoagulation. Continue 7 mg daily except 5 mg on Fridays and Mondays.  Recheck INR in 1 week (after being on Doxycyline x 1 week) on 08/25/12: lab at 9:30 am, coumadin clinic at 9:45am and MD visit at 10:30am..

## 2012-08-20 NOTE — Progress Notes (Signed)
1442 pt c/o lip tingling, headache itchiy face, BP 164/90, md notified. 1450 MD at chairside pt c/o throat closing o2 at 2L PAO2 98%, 50mg  benedryl IV, 125mg  Solumedrol per MD BP 166/80 HR 60 1502 BP 175/88 hr 5p VO 325mg  aspirin, pt c/o difficulty swallowing, unable answer questions due to lips/throat feeling "funny" 1506- Pt able to smile, stick out tongue move side to side. Pt appears confused, with no affect. Has difficulty getting words out. MD VO pt to be transferred to ED.

## 2012-08-20 NOTE — ED Notes (Signed)
Pt hx of colon cancer, receiving infusion, started having leg heaviness and difficulty speaking. Cancer center nurses thought pt was possibly having reaction to infusion, but not normal side effect of infusion. Wife reports that pt has similar infusion reaction 15 years ago. md at bedside assessing stroke scale. Pt able to speak in full sentences.

## 2012-08-20 NOTE — ED Notes (Signed)
Pt sent from cancer center, possible stroke.

## 2012-08-21 ENCOUNTER — Telehealth: Payer: Self-pay | Admitting: Medical Oncology

## 2012-08-21 ENCOUNTER — Other Ambulatory Visit: Payer: Self-pay | Admitting: Medical Oncology

## 2012-08-21 DIAGNOSIS — Z85038 Personal history of other malignant neoplasm of large intestine: Secondary | ICD-10-CM

## 2012-08-21 NOTE — Telephone Encounter (Signed)
I called pt to see how he is feeling. He was discharged from the ED last night after having a reaction to Vectibix. His wife states that he is feeling better and she slept well. She states she did not think pt realized what was happening to him yesterday. He states he felt like he had a mouth full of marbles and his throat is felt of closing off. Dr. Karel Jarvis updated and I asked his wife to call if any problems. She voiced understanding.

## 2012-08-25 ENCOUNTER — Ambulatory Visit: Payer: Medicare Other

## 2012-08-25 ENCOUNTER — Ambulatory Visit: Payer: Medicare Other | Admitting: Pharmacist

## 2012-08-25 ENCOUNTER — Other Ambulatory Visit: Payer: Self-pay | Admitting: Medical Oncology

## 2012-08-25 ENCOUNTER — Telehealth: Payer: Self-pay | Admitting: *Deleted

## 2012-08-25 ENCOUNTER — Other Ambulatory Visit (HOSPITAL_BASED_OUTPATIENT_CLINIC_OR_DEPARTMENT_OTHER): Payer: Medicare Other | Admitting: Lab

## 2012-08-25 ENCOUNTER — Ambulatory Visit (HOSPITAL_BASED_OUTPATIENT_CLINIC_OR_DEPARTMENT_OTHER): Payer: Medicare Other | Admitting: Hematology and Oncology

## 2012-08-25 ENCOUNTER — Other Ambulatory Visit: Payer: Self-pay | Admitting: *Deleted

## 2012-08-25 ENCOUNTER — Ambulatory Visit (INDEPENDENT_AMBULATORY_CARE_PROVIDER_SITE_OTHER): Payer: Medicare Other | Admitting: *Deleted

## 2012-08-25 VITALS — BP 141/76 | HR 42 | Temp 97.1°F | Resp 20 | Ht 66.0 in | Wt 164.8 lb

## 2012-08-25 DIAGNOSIS — C189 Malignant neoplasm of colon, unspecified: Secondary | ICD-10-CM

## 2012-08-25 DIAGNOSIS — I82409 Acute embolism and thrombosis of unspecified deep veins of unspecified lower extremity: Secondary | ICD-10-CM

## 2012-08-25 DIAGNOSIS — I495 Sick sinus syndrome: Secondary | ICD-10-CM

## 2012-08-25 DIAGNOSIS — Z86718 Personal history of other venous thrombosis and embolism: Secondary | ICD-10-CM

## 2012-08-25 DIAGNOSIS — Z85038 Personal history of other malignant neoplasm of large intestine: Secondary | ICD-10-CM

## 2012-08-25 LAB — CBC WITH DIFFERENTIAL/PLATELET
Basophils Absolute: 0 10*3/uL (ref 0.0–0.1)
Eosinophils Absolute: 0 10*3/uL (ref 0.0–0.5)
HGB: 13.8 g/dL (ref 13.0–17.1)
LYMPH%: 18.1 % (ref 14.0–49.0)
MCV: 90.6 fL (ref 79.3–98.0)
MONO%: 4.7 % (ref 0.0–14.0)
NEUT#: 5.9 10*3/uL (ref 1.5–6.5)
Platelets: 250 10*3/uL (ref 140–400)

## 2012-08-25 LAB — PACEMAKER DEVICE OBSERVATION
ATRIAL PACING PM: 80.1
RV LEAD IMPEDENCE PM: 456 Ohm

## 2012-08-25 LAB — PROTIME-INR: INR: 3.3 (ref 2.00–3.50)

## 2012-08-25 LAB — POCT INR: INR: 3.3

## 2012-08-25 NOTE — Telephone Encounter (Signed)
Per staff message and POF I have scheduled appts.  JMW  

## 2012-08-25 NOTE — Progress Notes (Signed)
[facility] Telephone:(336) [phone]   Fax:(336) [phone]  OFFICE PROGRESS NOTE  Catalina Pizza, MD  [address] [place] [address]  DIAGNOSIS: Relapsed Wild-type Colon cancer   Oncological History:  We Mr. Wenke our clinic earlier 2 weeks ago  for consultation regarding his relapsed colon cancer. Mr. 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 Subsequently further treatment was aborted and patient did not finish the rest of his adjuvant. Subsequently in 2002 Mr. Lama had abdominal cancer (since he does not know what kind of cancer was) is for which he underwent an attempt with surgical resection at OSH and he was told that full resection would be too dangerous since  this mass was very close to the aorta, thus,  partial resection was performed and subsequently patient was transferred to [facility] to see Dr. Ladona Ridgel for biopsy of the paraortic lymph node which was done and came back as adenocarcinoma.  Most recently in November 2013 patient underwent surgical resection of the terminal ileum and cecal adenocarcinoma and pathology revealed appendiceal orifice/cecal adenocarcinoma T4 N0. Was found to have 2 peritoneal implants and was referred to do a for present arrhythmias and an implant biopsy which was done on 07/04/2012 and pathology revealed metastatic carcinoma compatible with colorectal cancer. Patient was seen by Dr. Reginia Naas and his recommendation was to proceed with systemic chemotherapy using capecitabine  plus bevacizumab as a palliative measure if patient is  interested to receive chemotherapy. Last clinic visit,  Mr. Kuhner is accompanied by his wife and 2 sons and daughter-in-law.  With Breck Coons we had a along discussion about his disease, diagnosis, clinical status and clinic stage. We explained the disease to him and we will inform the patient and his family dos since his stage IV and dose goal of any treatment would be palliative goal and will not be a curative goal. Therefore, the risk and benefit of the treatments has to be weighted carefully. We went over the currently the FDA approved chemotherapeutic agents for the treatment of metastatic colon cancer we also went over the side effects in details.  We explained to the patient and his family the role of the EGFR inhibitors drugs in colon cancer and the concept of K-ras mutation and if his tumor is K-RAS wild type it might be reasonable to try xx as a single agent. Final plan was to check K-RAS status of his tumor and repeat his Ct scans as a new baseline.        INTERVAL HISTORY: Beverley Sherrard. 79 y.o. male returns to the clinic today for follow up and reassessment and toxicity check after receiving the first treatment of Panitumumab (Vectibix). The patient receivedPanitumumab (Vectibix). On 08/20/2012 and and right after his infusion was done patients to complain of having heart and breathing, said his throat is closing up his, and says that his lips were tingling. Solu-Medrol and Benadryl were given, his vitals were stable but patient clinically  looked like he was having TIA or possible  reaction to therapy.  Patient was  transferred to the emergency room, CT scan of the head was obtained and was negative and he eventually recovered and was discharged home. He denied , in the clinic today, any  change in bowel habits but you continue to have 3-4 bowel movements a day. Denied any fever, no rash. He complaints of an episode of the left-sided chest pain yesterday, which was resulted  spontaneously . The      MEDICAL HISTORY: Past Medical History  Diagnosis Date  . DVT (deep venous thrombosis)     hx of s/p [place]field filter  . OAB (overactive bladder)   . Arthritis   . Cataracts, bilateral   . FH: factor V Leiden deficiency   . History of nephrolithiasis   . History of ankle fracture   . 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  . Peripheral neuropathy   . Vitamin B12 deficiency   . Symptomatic bradycardia   . Sinoatrial node dysfunction   . Sinoatrial node dysfunction   . Pacemaker 4 yrs ago  . Colon cancer 07/23/00    hx of metastic- treated with surgery and readiation  . Allergy     celebrex  . History of radiation therapy 09/02/00-10/07/00    recurrent colon cancer/left periaortic nodal recurrence=4500cGy/25 fxs,Dr.Wu  . Hx SBO     multiple intermittent and surgery  . History of chemotherapy     ALLERGIES:  is allergic to celecoxib.  MEDICATIONS:  Current Outpatient Prescriptions  Medication Sig Dispense Refill  . acetaminophen (TYLENOL) 500 MG tablet Take 500 mg by mouth every 6 (six) hours as needed for pain.       . cholecalciferol (VITAMIN D) 1000 UNITS tablet Take 1,000 Units by mouth daily.      . cholestyramine (QUESTRAN) 4 G packet Take 1 packet by mouth as needed.       . potassium chloride SA (K-DUR,KLOR-CON) 20 MEQ tablet 20 mEq daily.       Marland Kitchen warfarin (COUMADIN) 1 MG tablet Take 1 tablet (1 mg total) by mouth as directed.  60 tablet  2  . warfarin (COUMADIN) 2 MG tablet as directed.       . warfarin (COUMADIN) 5 MG tablet TAKE AS DIRECTED  60 tablet  3  . diphenoxylate-atropine (LOMOTIL) 2.5-0.025 MG per tablet 1 tablet. Take 1 tablet by mouth 4 (four) times daily as needed for Diarrhea.      . doxycycline (VIBRAMYCIN) 100 MG capsule Take 2 tablets on first day then 1 tablet daily  30 capsule  3  . metroNIDAZOLE (METROGEL) 1 % gel Apply topically 2 (two) times daily as needed. For rash on face  45 g  1  .  oxyCODONE-acetaminophen (PERCOCET/ROXICET) 5-325 MG per tablet Take 1 tablet by mouth every 4 (four) hours as needed for pain.  20 tablet  0  . promethazine (PHENERGAN) 12.5 MG tablet 12.5 mg. Take 12.5 mg by mouth every 6 (six) hours as needed for Nausea.       No current facility-administered medications for this visit.    SURGICAL HISTORY:  Past Surgical History  Procedure Laterality Date  . Colectomy      sigmoid secondary to colon ca- 1997  . Exploratory laparotomy      metastatic retroperitoneal lymph node- 2002  . Total knee arthroplasty      right-1991; left-1992  . Back surgeries      x3  . Filter replacement    . Left ankle surgery    .  Incisional herniorraphy    . Pacemaker insertion  05/19/2008  . Replacement total knee bilateral    . Colon resection    . Colonoscopy  08/18/2010    Procedure: COLONOSCOPY;  Surgeon: Malissa Hippo, MD;  Location: [facility];  Service: Endoscopy;  Laterality: N/A;  . Esophagogastroduodenoscopy  08/18/2010    Procedure: ESOPHAGOGASTRODUODENOSCOPY (EGD);  Surgeon: Malissa Hippo, MD;  Location: [facility];  Service: Endoscopy;  Laterality: N/A;  . Partial colectomy  11/28/2011    Procedure: PARTIAL COLECTOMY;  Surgeon: Dalia Heading, MD;  Location: [facility];  Service: General;  Laterality: N/A;  . Laparoscopic ileocecectomy  11/28/11  . Appendectomy      REVIEW OF SYSTEMS:  Pertinent items are noted in HPI.   PHYSICAL EXAMINATION:  GENERAL: elderly, lethargic.  No distress,  in a wheelchair  SKIN:  No rashes or significant lesions  HEAD: Normocephalic, No masses, lesions, tenderness or abnormalities  EYES: Conjunctiva are pink and non-injected  ENT: External ears normal ,lips , buccal mucosa, and tongue normal and mucous membranes are moist  LYMPH: No palpable lymphadenopathy  BREAST:Normal without mass, skin or nipple changes or axillary nodes,  LUNGS: Clear to auscultation, no crackles or wheezes HEART: Regular rate & rhythm, no  murmurs, no gallops, S1 normal and S2 normal  ABDOMEN: Abdomen soft, non-tender, normal bowel sounds, no masses or organomegaly and no hepatosplenomegaly  MSK: No CVA tenderness and no tenderness on percussion of the back or rib cage. EXTREMITIES: No edema, no skin discoloration or tenderness NEURO: Alert & oriented, no focal motor/sensory deficits.  ECOG PERFORMANCE STATUS: 2 -3  Symptomatic  Blood pressure 141/76, pulse 42, temperature 97.1 F (36.2 C), temperature source Oral, resp. rate 20, height 5\' 6"  (1.676 m), weight 164 lb 12.8 oz (74.753 kg).   LABORATORY DATA: Lab Results  Component Value Date   WBC 7.0 08/01/2012   HGB 13.6 08/01/2012   HCT 40.8 08/01/2012   MCV 90.0 08/01/2012   PLT 233 08/01/2012      Chemistry      Component Value Date/Time   NA 140 08/01/2012 1313   NA 139 07/08/2012 1100   K 4.1 08/01/2012 1313   K 3.9 07/08/2012 1100   CL 102 07/08/2012 1100   CO2 26 08/01/2012 1313   CO2 28 07/08/2012 1100   BUN 16.8 08/01/2012 1313   BUN 18 07/08/2012 1100   CREATININE 1.7* 08/01/2012 1313   CREATININE 1.69* 07/08/2012 1100      Component Value Date/Time   CALCIUM 9.0 08/01/2012 1313   CALCIUM 9.2 07/08/2012 1100   ALKPHOS 53 06/17/2012 1004   AST 12 06/17/2012 1004   ALT <5 06/17/2012 1004   BILITOT 0.5 06/17/2012 1004       RADIOGRAPHIC STUDIES: Ct Abdomen Pelvis Wo Contrast  08/08/2012   *RADIOLOGY REPORT*  Clinical Data:  Colon cancer, partial colectomy  CT CHEST, ABDOMEN AND PELVIS WITHOUT CONTRAST  Technique:  Multidetector CT imaging of the chest, abdomen and pelvis was performed following the standard protocol without IV contrast.  Comparison:  PET CT 06/02/2012  CT CHEST  Findings:  No axillary or supraclavicular lymphadenopathy.  No mediastinal or hilar lymphadenopathy.  No pericardial fluid.  Trace pericardial fluid.  Review of the lung parenchyma demonstrates small bilateral pleural effusions.  There is small focus of ill- defined air space disease in the  medial left lower lobe (image 39).  IMPRESSION: 1.   No evidence of thoracic metastasis.  2.  Small focus of probable atelectasis or infection at  the medial left lung base.  Recommend attention on follow-up.  CT ABDOMEN AND PELVIS  Findings:    Non-IV contrast images demonstrates a simple cyst in the right hepatic lobe.  Post cholecystectomy.  The pancreas, spleen, adrenal glands, adrenal glands are normal.  There is a calcification within the left renal pelvis measuring 6 mm which is nonobstructive.  SUV 4 mm right lower pole calculus.  The stomach, some of small bowel are normal.  There is a partial right hemicolectomy. There is a second anastomosis in the descending colon.  There is no evidence of nodularity obstruction at the anastomoses.  There are diverticula of the sigmoid colon without acute inflammation.  There is some thickening of the anterior wall of the distal rectum to 17 mm (image 107 this was not significant hypermetabolic on comparison PET CT scan.  No retroperitoneal lymphadenopathy.  No pelvic lymphadenopathy. Review of  bone windows demonstrates no aggressive osseous lesions.  Prostate gland bladder are normal. There is a large calcification the posterior right  bladder measuring 15 mm.  IVC filter noted.  Examination of the peritoneal space demonstrates a nodule along the right pericolic gutter measuring 7 mm x 7 mm not changed from 10 mm x 6 mm on prior (image 69).  Nodule in the right iliac fossa measures 16 x 28 mm (image 87) which measures slightly larger than 15 x 14 mm on prior.  No clear evidence of new peritoneal metastasis.  The  IMPRESSION:  1. Stable to slightly enlarged peritoneal metastasis.  No new lesions evident.  2. Stable postsurgical change in the right left colon.  3.  Renal calculi, left ureteral pelvic calculus, and bladder calculus are unchanged. 4.  Thickening of the distal rectum was not hypermetabolic on comparison PET CT scan.   Original Report Authenticated By:  Genevive Bi, M.D.   Ct Chest Wo Contrast  08/08/2012   *RADIOLOGY REPORT*  Clinical Data:  Colon cancer, partial colectomy  CT CHEST, ABDOMEN AND PELVIS WITHOUT CONTRAST  Technique:  Multidetector CT imaging of the chest, abdomen and pelvis was performed following the standard protocol without IV contrast.  Comparison:  PET CT 06/02/2012  CT CHEST  Findings:  No axillary or supraclavicular lymphadenopathy.  No mediastinal or hilar lymphadenopathy.  No pericardial fluid.  Trace pericardial fluid.  Review of the lung parenchyma demonstrates small bilateral pleural effusions.  There is small focus of ill- defined air space disease in the medial left lower lobe (image 39).  IMPRESSION: 1.   No evidence of thoracic metastasis.  2.   Small focus of probable atelectasis or infection at  the medial left lung base.  Recommend attention on follow-up.  CT ABDOMEN AND PELVIS  Findings:    Non-IV contrast images demonstrates a simple cyst in the right hepatic lobe.  Post cholecystectomy.  The pancreas, spleen, adrenal glands, adrenal glands are normal.  There is a calcification within the left renal pelvis measuring 6 mm which is nonobstructive.  SUV 4 mm right lower pole calculus.  The stomach, some of small bowel are normal.  There is a partial right hemicolectomy. There is a second anastomosis in the descending colon.  There is no evidence of nodularity obstruction at the anastomoses.  There are diverticula of the sigmoid colon without acute inflammation.  There is some thickening of the anterior wall of the distal rectum to 17 mm (image 107 this was not significant hypermetabolic on comparison PET  CT scan.  No retroperitoneal lymphadenopathy.  No pelvic lymphadenopathy. Review of  bone windows demonstrates no aggressive osseous lesions.  Prostate gland bladder are normal. There is a large calcification the posterior right  bladder measuring 15 mm.  IVC filter noted.  Examination of the peritoneal space demonstrates a  nodule along the right pericolic gutter measuring 7 mm x 7 mm not changed from 10 mm x 6 mm on prior (image 69).  Nodule in the right iliac fossa measures 16 x 28 mm (image 87) which measures slightly larger than 15 x 14 mm on prior.  No clear evidence of new peritoneal metastasis.  The  IMPRESSION:  1. Stable to slightly enlarged peritoneal metastasis.  No new lesions evident.  2. Stable postsurgical change in the right left colon.  3.  Renal calculi, left ureteral pelvic calculus, and bladder calculus are unchanged. 4.  Thickening of the distal rectum was not hypermetabolic on comparison PET CT scan.   Original Report Authenticated By: Genevive Bi, M.D.    ASSESSMENT AND PLAN:   Mr. Anthonette Legato is a pleasant 79 years old male with the current metastatic colon cancer was multiple medical comorbidities and poor performance status s/p multiple lines of treatment. He received one treatment of  single agent Panitumumab (Vectibix) last week. I informed Mr. Delahoussaye and his family that am not really sure what was the reason for his symptoms, I told them this could be a side effect from the drug or the premeds but it also could be just a course of ankle finding. Nonetheless the fact that it happened quickly after the treatment I would think it is more likely to be secondary to the treatment.   Again we had a very lengthy discussion about his disease, goal of treatment, side effects.  I have stressed the fact that goal of treatment is a palliative goal, and with single agent Panitumumab (Vectibix), I do not expect a great response; nonetheless,  sometimes such a result can be seen. Giving the events that  followed his treatment as well as his most recent complaint of chest pain, I recommended cardiology evaluation first then to re-visit the issue of supportive care and symptoms management versus continuing Panitumumab (Vectibix). We will cancel his treatment that was scheduled on September 03, 2012. Patient would be seen  by Dr. Mart Piggs  on September 3 for evaluation and final decision about continuation of therapy versus initiation of supportive care and symptoms management without chemotherapeutic or targeted agents. Patient has family in full agreement with the plan.   All questions were answered. The patient knows to call the clinic with any problems, questions or concerns. We can certainly see the patient much sooner if necessary.    Zachery Dakins, MD 08/01/20143:39 PM

## 2012-08-25 NOTE — Progress Notes (Signed)
Met with patient and family. Explained role of nurse navigator. Provided [facility] and phone numbers. Referral made to dietician for diet education.  Contact names and phone numbers of Dr. Kalman Drape care team were provided.  No barriers to care identified.  Will continue to follow as needed.

## 2012-08-25 NOTE — Progress Notes (Signed)
INR = 3.3 on Coumadin 7 mg daily; 5 mg Mon/Fri. Pt was on Prednisone 40 mg daily from 8/6 until 8/10 after having reaction to Vectibix on 8/6.  No other med changes. He is dizzy today.  No acute, severe HA, dysphasia or weakness. He had an episode of L sided chest pain last night that lasted 5 minutes.  No indigestion.  No further pain. INR slightly above goal.  Likely due to recent Prednisone. I will have pt continue same Coumadin dose. Retun 09/03/12 as scheduled. Ebony Hail, Pharm.D., CPP 08/25/2012@9 :53 AM

## 2012-08-26 ENCOUNTER — Telehealth: Payer: Self-pay | Admitting: *Deleted

## 2012-08-26 NOTE — Telephone Encounter (Signed)
Per staff message and POF I have scheduled appts.  JMW  

## 2012-09-01 ENCOUNTER — Other Ambulatory Visit: Payer: Medicare Other | Admitting: Lab

## 2012-09-02 ENCOUNTER — Other Ambulatory Visit: Payer: Medicare Other | Admitting: Lab

## 2012-09-02 ENCOUNTER — Ambulatory Visit: Payer: Medicare Other

## 2012-09-03 ENCOUNTER — Ambulatory Visit: Payer: Medicare Other

## 2012-09-03 ENCOUNTER — Ambulatory Visit (HOSPITAL_BASED_OUTPATIENT_CLINIC_OR_DEPARTMENT_OTHER): Payer: Medicare Other | Admitting: Pharmacist

## 2012-09-03 ENCOUNTER — Other Ambulatory Visit (HOSPITAL_BASED_OUTPATIENT_CLINIC_OR_DEPARTMENT_OTHER): Payer: Medicare Other | Admitting: Lab

## 2012-09-03 ENCOUNTER — Other Ambulatory Visit: Payer: Medicare Other | Admitting: Lab

## 2012-09-03 DIAGNOSIS — I82409 Acute embolism and thrombosis of unspecified deep veins of unspecified lower extremity: Secondary | ICD-10-CM

## 2012-09-03 DIAGNOSIS — Z86718 Personal history of other venous thrombosis and embolism: Secondary | ICD-10-CM

## 2012-09-03 DIAGNOSIS — C189 Malignant neoplasm of colon, unspecified: Secondary | ICD-10-CM

## 2012-09-03 DIAGNOSIS — Z85038 Personal history of other malignant neoplasm of large intestine: Secondary | ICD-10-CM

## 2012-09-03 LAB — COMPREHENSIVE METABOLIC PANEL (CC13)
ALT: 9 U/L (ref 0–55)
AST: 13 U/L (ref 5–34)
Alkaline Phosphatase: 55 U/L (ref 40–150)
Calcium: 8.5 mg/dL (ref 8.4–10.4)
Chloride: 107 mEq/L (ref 98–109)
Creatinine: 1.5 mg/dL — ABNORMAL HIGH (ref 0.7–1.3)

## 2012-09-03 LAB — PROTIME-INR
INR: 3.2 (ref 2.00–3.50)
Protime: 38.4 Seconds — ABNORMAL HIGH (ref 10.6–13.4)

## 2012-09-03 NOTE — Patient Instructions (Addendum)
Take 5mg  Coumadin tonight. Then, continue 7 mg daily except 5 mg on Fridays and Mondays. Return 09/17/12: lab at 10:30 am, coumadin clinic at 10:45 am, and Dr. Truett Perna at 11:00.

## 2012-09-03 NOTE — Progress Notes (Addendum)
INR = 3.2 on Coumadin 7 mg daily; 5 mg Mon/Fri.  Patient's slight elevation in INR may be due to doxycycline.  Per wife's report, may have about 2 weeks left of the doxycycline.    Since INR slightly supratherapeutic, patient instructed to take 5mg  Coumadin tonight. Then, continue 7 mg daily except 5 mg on Fridays and Mondays.  Return 09/17/12: lab at 10:30 am, coumadin clinic at 10:45 am, and Dr. Truett Perna at 11:00.  Patient not receiving chemotherapy today.   Lillia Pauls, PharmD Clinical Pharmacist 09/03/2012 11:22 AM

## 2012-09-03 NOTE — Addendum Note (Signed)
Addended by: Mathis Fare on: 09/03/2012 11:35 AM   Modules accepted: Level of Service

## 2012-09-17 ENCOUNTER — Encounter: Payer: Medicare Other | Admitting: Nutrition

## 2012-09-17 ENCOUNTER — Telehealth: Payer: Self-pay | Admitting: Oncology

## 2012-09-17 ENCOUNTER — Other Ambulatory Visit (HOSPITAL_BASED_OUTPATIENT_CLINIC_OR_DEPARTMENT_OTHER): Payer: Medicare Other | Admitting: Lab

## 2012-09-17 ENCOUNTER — Ambulatory Visit: Payer: Medicare Other

## 2012-09-17 ENCOUNTER — Ambulatory Visit (HOSPITAL_BASED_OUTPATIENT_CLINIC_OR_DEPARTMENT_OTHER): Payer: Medicare Other | Admitting: Oncology

## 2012-09-17 ENCOUNTER — Ambulatory Visit (HOSPITAL_BASED_OUTPATIENT_CLINIC_OR_DEPARTMENT_OTHER): Payer: Medicare Other | Admitting: Pharmacist

## 2012-09-17 ENCOUNTER — Encounter: Payer: Self-pay | Admitting: Nutrition

## 2012-09-17 VITALS — BP 147/80 | HR 62 | Temp 97.0°F | Resp 18 | Ht 66.0 in | Wt 161.6 lb

## 2012-09-17 DIAGNOSIS — C786 Secondary malignant neoplasm of retroperitoneum and peritoneum: Secondary | ICD-10-CM

## 2012-09-17 DIAGNOSIS — Z86711 Personal history of pulmonary embolism: Secondary | ICD-10-CM

## 2012-09-17 DIAGNOSIS — Z86718 Personal history of other venous thrombosis and embolism: Secondary | ICD-10-CM

## 2012-09-17 DIAGNOSIS — I82409 Acute embolism and thrombosis of unspecified deep veins of unspecified lower extremity: Secondary | ICD-10-CM

## 2012-09-17 DIAGNOSIS — C180 Malignant neoplasm of cecum: Secondary | ICD-10-CM

## 2012-09-17 DIAGNOSIS — C189 Malignant neoplasm of colon, unspecified: Secondary | ICD-10-CM

## 2012-09-17 DIAGNOSIS — Z7901 Long term (current) use of anticoagulants: Secondary | ICD-10-CM

## 2012-09-17 LAB — COMPREHENSIVE METABOLIC PANEL (CC13)
Albumin: 2.7 g/dL — ABNORMAL LOW (ref 3.5–5.0)
BUN: 21.3 mg/dL (ref 7.0–26.0)
Calcium: 8.6 mg/dL (ref 8.4–10.4)
Chloride: 109 mEq/L (ref 98–109)
Glucose: 110 mg/dl (ref 70–140)
Potassium: 3.5 mEq/L (ref 3.5–5.1)
Sodium: 142 mEq/L (ref 136–145)
Total Protein: 6 g/dL — ABNORMAL LOW (ref 6.4–8.3)

## 2012-09-17 LAB — CBC WITH DIFFERENTIAL/PLATELET
Basophils Absolute: 0 10*3/uL (ref 0.0–0.1)
Eosinophils Absolute: 0.7 10*3/uL — ABNORMAL HIGH (ref 0.0–0.5)
HGB: 13.9 g/dL (ref 13.0–17.1)
MONO#: 0.5 10*3/uL (ref 0.1–0.9)
NEUT#: 3.2 10*3/uL (ref 1.5–6.5)
RBC: 4.67 10*6/uL (ref 4.20–5.82)
RDW: 14.9 % — ABNORMAL HIGH (ref 11.0–14.6)
WBC: 5.6 10*3/uL (ref 4.0–10.3)
lymph#: 1.2 10*3/uL (ref 0.9–3.3)

## 2012-09-17 LAB — PROTIME-INR: INR: 4.1 — ABNORMAL HIGH (ref 2.00–3.50)

## 2012-09-17 LAB — POCT INR: INR: 4.1

## 2012-09-17 NOTE — Telephone Encounter (Signed)
Gave pt appt for MD only on october 2014

## 2012-09-17 NOTE — Progress Notes (Signed)
[facility]    OFFICE PROGRESS NOTE   INTERVAL HISTORY:   [name] has a long-standing history of colorectal cancer. He was initially diagnosed with a cancer of the sigmoid colon in 1997. He underwent resection of a periaortic lymph node in 2002 with the pathology confirming adenocarcinoma. In November 2013 he underwent resection of the terminal ileum and cecum with the pathology revealing and appendiceal orifice/cecal adenocarcinoma (T4 N0). He did not receive adjuvant therapy. In may of 2014 a PET scan revealed hypermetabolic peritoneal nodules. A CT-guided biopsy of a nodule on 07/04/2012 revealed metastatic carcinoma compatible with a colorectal primary.   He saw Dr. Lattie Corns on 07/15/2012. Dr. Lattie Corns recommended observation versus a trial of Xeloda/Avastin. Mr. Edwyna Shell reports his "heart stopped "after taking adjuvant chemotherapy in 1997,? 5-FU/leucovorin. He was referred to Dr. Karel Jarvis. K-ras testing on the November 2013 tumor revealed a wild type K-ras. A decision was made to proceed with a trial of panitumumab. He was treated on 08/20/2012 and developed confusion, lip tingling, pruritus over the face, and a feeling of "throat closing ". He was referred to the emergency room and his symptoms resolved.  He developed a rash over the back and face over the next week.  Mr. Sides presents today with multiple family members to discuss treatment options. He feels well at present. Good appetite. No pain. He is reluctant to consider additional chemotherapy given the reaction to chemotherapy in 1997 and panitumumab last month.  Objective:  Vital signs in last 24 hours:  Blood pressure 147/80, pulse 62, temperature 97 F (36.1 C), temperature source Oral, resp. rate 18, height 5\' 6"  (1.676 m), weight 161 lb 9.6 oz (73.301 kg).    HEENT: Neck without mass Lymphatics: No cervical, supraclavicular, axillary, or inguinal nodes Resp: Lungs clear bilaterally Cardio: Irregular GI: No  hepatomegaly, no apparent ascites, no mass Vascular: No leg edema Neuro: Alert and oriented  Skin: Acne type rash over the face and back     Lab Results:  Lab Results  Component Value Date   WBC 5.6 09/17/2012   HGB 13.9 09/17/2012   HCT 41.9 09/17/2012   MCV 89.8 09/17/2012   PLT 204 09/17/2012   ANC 3.2 PT/INR 4.1  CEA 3.2 on 08/01/2012 Medications: I have reviewed the patient's current medications.  Assessment/Plan:  1. Metastatic colorectal cancer, status post resection of a cecum/appendiceal mass in November 2013 (T4 N0), K-ras wild-type -Biopsy of a peritoneal nodule 07/04/2012 confirmed metastatic colorectal cancer  2. Sigmoid colon cancer in 1997, status post a partial course of adjuvant chemotherapy (? 5-FU/leucovorin), discontinued secondary to a cardiovascular event  3. Resection of a periaortic lymph node in 2002 with the pathology confirming a moderately differentiated carcinoma  4. History of a DVT/pulmonary embolism, status post IVC filter placement, maintained on Coumadin  5. Arrhythmia, status post pacemaker placed  Disposition:  Mr. Lauder has metastatic colon cancer. A staging CT evaluation in July of 2014 revealed several peritoneal nodules. No other clinical evidence of metastatic disease. He does not appear symptomatic from colon cancer at present. He was treated with a single dose of panitumumab on 08/20/2012 and had an acute allergic reaction.  I discussed treatment options with Mr. Kyser and his family. He understands no therapy will be curative. It is possible the current colon cancer is related to the tumor from 1997 and 2002. There is no evidence of visceral involvement.  We discussed capecitabine/Avastin. Mr. Tugwell and his family are comfortable with an  observation approach. He will return for an office visit in 6 weeks.  He continues followup in the Coumadin clinic for management of anticoagulation therapy.  Approximately 45 minutes were spent with  the patient and his family today. Greater than 50% of the time was spent in counseling/coordination of care.     Thornton Papas, MD  09/17/2012  3:13 PM

## 2012-09-17 NOTE — Progress Notes (Addendum)
INR above goal today at 4.1. Pt reports no bleeding or bruising (some minor bruising on hands due to thinning skin etc). No extra doses or missed doses. Pt reports no diet changes. Pt was on doxycycline course and finished this yesterday (this could have resulted in a slight increase in his INR). Patient's INR was slightly elevated at last coumadin clinic visit (3.2). So today will make a dose adjustment. The plan is No coumadin today or tomorrow (9/3 and 9/4). Then, change dose to 5 mg daily except 7 mg on Mondays, Weds, and Fridays . Pt is going to the beach this weekend and will be back later next week so we will see Mr. Buening back at the end of next week Return 09/26/12: lab at 10:00 am, coumadin clinic at 10:15 am

## 2012-09-17 NOTE — Progress Notes (Signed)
Nutrition appointment could not be completed because patient did not have scheduled treatment.  I have requested patient be rescheduled.

## 2012-09-17 NOTE — Patient Instructions (Addendum)
INR above goal No coumadin today or tomorrow (9/3 + 9/4).  Then, change dose to 5 mg daily except 7 mg on Mondays, Weds, and Fridays . Return 09/26/12: lab at 10:00 am, coumadin clinic at 10:15 am

## 2012-09-22 ENCOUNTER — Encounter: Payer: Self-pay | Admitting: Hematology and Oncology

## 2012-09-26 ENCOUNTER — Other Ambulatory Visit (HOSPITAL_BASED_OUTPATIENT_CLINIC_OR_DEPARTMENT_OTHER): Payer: Medicare Other | Admitting: Lab

## 2012-09-26 ENCOUNTER — Ambulatory Visit (HOSPITAL_BASED_OUTPATIENT_CLINIC_OR_DEPARTMENT_OTHER): Payer: Medicare Other | Admitting: Pharmacist

## 2012-09-26 DIAGNOSIS — Z86718 Personal history of other venous thrombosis and embolism: Secondary | ICD-10-CM

## 2012-09-26 DIAGNOSIS — I82409 Acute embolism and thrombosis of unspecified deep veins of unspecified lower extremity: Secondary | ICD-10-CM

## 2012-09-26 DIAGNOSIS — Z7901 Long term (current) use of anticoagulants: Secondary | ICD-10-CM

## 2012-09-26 DIAGNOSIS — Z5181 Encounter for therapeutic drug level monitoring: Secondary | ICD-10-CM

## 2012-09-26 DIAGNOSIS — C189 Malignant neoplasm of colon, unspecified: Secondary | ICD-10-CM

## 2012-09-26 LAB — PROTIME-INR: Protime: 26.4 Seconds — ABNORMAL HIGH (ref 10.6–13.4)

## 2012-09-26 LAB — POCT INR: INR: 2.2

## 2012-09-26 NOTE — Patient Instructions (Signed)
INR is good today! Continue 5 mg daily except 7 mg on Mondays, Weds, and Fridays . Return 10/08/12: lab at 10:00 am, coumadin clinic at 10:15 am.

## 2012-09-26 NOTE — Progress Notes (Signed)
INR within goal today. No problems to report regarding anticoagulation. Pt stopped Doxycycline on 09/16/12. No other medication changes. No additional missed coumadin doses other than the instructed doses on 9/3 and 9/4. No changes in diet. Pt is eating well and appetite is good. Pt has an orthopedic doctor that he may make an appointment with soon because his neck "pops" when he turns his head from side to side. Will continue 5 mg daily except 7 mg on Mondays, Weds, and Fridays . Return 10/08/12: lab at 10:00 am, coumadin clinic at 10:15 am. Will evaluate INR and adjust coumadin dose as necessary. (Pt was on 7mg  daily except 5mg  on Mon&Fri prior to starting Doxycycline).  Pt sees Dr. Truett Perna on 10/28/12. They will discuss possibly started Xeloda/Avastin. Will need to adjust coumadin dose when/if Xeloda is started.

## 2012-09-29 ENCOUNTER — Encounter: Payer: Self-pay | Admitting: Internal Medicine

## 2012-10-08 ENCOUNTER — Ambulatory Visit (HOSPITAL_BASED_OUTPATIENT_CLINIC_OR_DEPARTMENT_OTHER): Payer: Medicare Other | Admitting: Lab

## 2012-10-08 ENCOUNTER — Ambulatory Visit (HOSPITAL_BASED_OUTPATIENT_CLINIC_OR_DEPARTMENT_OTHER): Payer: Medicare Other | Admitting: Pharmacist

## 2012-10-08 DIAGNOSIS — I82409 Acute embolism and thrombosis of unspecified deep veins of unspecified lower extremity: Secondary | ICD-10-CM

## 2012-10-08 DIAGNOSIS — C189 Malignant neoplasm of colon, unspecified: Secondary | ICD-10-CM

## 2012-10-08 DIAGNOSIS — Z86718 Personal history of other venous thrombosis and embolism: Secondary | ICD-10-CM

## 2012-10-08 DIAGNOSIS — Z23 Encounter for immunization: Secondary | ICD-10-CM

## 2012-10-08 LAB — PROTIME-INR: Protime: 30 Seconds — ABNORMAL HIGH (ref 10.6–13.4)

## 2012-10-08 LAB — POCT INR: INR: 2.5

## 2012-10-08 MED ORDER — INFLUENZA VAC SPLIT QUAD 0.5 ML IM SUSP
0.5000 mL | Freq: Once | INTRAMUSCULAR | Status: AC
  Administered 2012-10-08: 0.5 mL via INTRAMUSCULAR
  Filled 2012-10-08: qty 0.5

## 2012-10-08 NOTE — Progress Notes (Signed)
INR = 2.5 on Coumadin 5 mg/day; 7 mg MWF No complaints re: anticoag. Requesting flu vaccine today.   INR at goal.  No change to Coumadin dose necessary. Return 10/28/12 when due to see MD same day. Will have RN give pt flu vaccine in clinic today. Ebony Hail, Pharm.D., CPP 10/08/2012@10 :25 AM

## 2012-10-13 ENCOUNTER — Ambulatory Visit (INDEPENDENT_AMBULATORY_CARE_PROVIDER_SITE_OTHER): Payer: Medicare Other | Admitting: *Deleted

## 2012-10-13 DIAGNOSIS — I495 Sick sinus syndrome: Secondary | ICD-10-CM

## 2012-10-14 LAB — REMOTE PACEMAKER DEVICE
AL AMPLITUDE: 1.3 mv
BATTERY VOLTAGE: 2.99 V
RV LEAD AMPLITUDE: 5.9 mv
RV LEAD IMPEDENCE PM: 472 Ohm
VENTRICULAR PACING PM: 0.77

## 2012-10-28 ENCOUNTER — Ambulatory Visit (HOSPITAL_BASED_OUTPATIENT_CLINIC_OR_DEPARTMENT_OTHER): Payer: Medicare Other | Admitting: Oncology

## 2012-10-28 ENCOUNTER — Ambulatory Visit (HOSPITAL_BASED_OUTPATIENT_CLINIC_OR_DEPARTMENT_OTHER): Payer: Self-pay | Admitting: Pharmacist

## 2012-10-28 ENCOUNTER — Other Ambulatory Visit (HOSPITAL_BASED_OUTPATIENT_CLINIC_OR_DEPARTMENT_OTHER): Payer: Medicare Other | Admitting: Lab

## 2012-10-28 ENCOUNTER — Telehealth: Payer: Self-pay | Admitting: Oncology

## 2012-10-28 ENCOUNTER — Encounter: Payer: Self-pay | Admitting: *Deleted

## 2012-10-28 VITALS — BP 149/77 | HR 61 | Temp 96.8°F | Resp 20 | Ht 66.0 in | Wt 168.4 lb

## 2012-10-28 DIAGNOSIS — Z86718 Personal history of other venous thrombosis and embolism: Secondary | ICD-10-CM

## 2012-10-28 DIAGNOSIS — Z7901 Long term (current) use of anticoagulants: Secondary | ICD-10-CM

## 2012-10-28 DIAGNOSIS — Z86711 Personal history of pulmonary embolism: Secondary | ICD-10-CM

## 2012-10-28 DIAGNOSIS — C172 Malignant neoplasm of ileum: Secondary | ICD-10-CM

## 2012-10-28 DIAGNOSIS — C189 Malignant neoplasm of colon, unspecified: Secondary | ICD-10-CM

## 2012-10-28 DIAGNOSIS — C181 Malignant neoplasm of appendix: Secondary | ICD-10-CM

## 2012-10-28 DIAGNOSIS — I82409 Acute embolism and thrombosis of unspecified deep veins of unspecified lower extremity: Secondary | ICD-10-CM

## 2012-10-28 DIAGNOSIS — C180 Malignant neoplasm of cecum: Secondary | ICD-10-CM

## 2012-10-28 LAB — PROTIME-INR: INR: 2.1 (ref 2.00–3.50)

## 2012-10-28 NOTE — Progress Notes (Signed)
Pt seen prior to MD appmt today INR=2.1 on 5mg  daily with 7 mg on MWF No changes to report and no signs of bleeding  Continue 5 mg daily except 7 mg on Mondays, Weds, and Fridays . Return 12/03/12: lab at 9:00 am and coumadin clinic at 9:15 am.

## 2012-10-28 NOTE — Patient Instructions (Signed)
Continue 5 mg daily except 7 mg on Mondays, Weds, and Fridays . Return 12/03/12: lab at 9:00 am and coumadin clinic at 9:15 am.

## 2012-10-28 NOTE — Telephone Encounter (Signed)
gv pt/wife appt schedule for December.

## 2012-10-28 NOTE — Progress Notes (Signed)
   [facility]    OFFICE PROGRESS NOTE   INTERVAL HISTORY:   He returns for scheduled visit. The skin rash has improved.good appetite. He reports chronic leg weakness.  Objective:  Vital signs in last 24 hours:  Blood pressure 149/77, pulse 61, temperature 96.8 F (36 C), temperature source Oral, resp. rate 20, height 5\' 6"  (1.676 m), weight 168 lb 6.4 oz (76.386 kg).    Resp: lungs clear bilaterally Cardio: regular rhythm premature beats GI: no hepatomegaly, no ascites, nontender Vascular: no leg edema Neuro:he is able to ambulate with assistance  Skin:resolving rash over the trunk   Labs: PT/INR 2.1  Medications: I have reviewed the patient's current medications.  Assessment/Plan: 1. Metastatic colorectal cancer, status post resection of a cecum/appendiceal mass in November 2013 (T4 N0), K-ras wild-type  -Biopsy of a peritoneal nodule 07/04/2012 confirmed metastatic colorectal cancer  -cycle 1 panitumumab 08/20/2012 2. Sigmoid colon cancer in 1997, status post a partial course of adjuvant chemotherapy (? 5-FU/leucovorin), discontinued secondary to a cardiovascular event  3. Resection of a periaortic lymph node in 2002 with the pathology confirming a moderately differentiated carcinoma  4. History of a DVT/pulmonary embolism, status post IVC filter placement, maintained on Coumadin , [facility] 5. Arrhythmia, status post pacemaker placed   Disposition:  He appears to have recovered from the panitumumab therapy. Mr. Vasques  Is asymptomatic from the metastatic colon cancer. The plan is to continue an observation approach. [facility] for management of Coumadin therapy. He will return for an office visit in approximately 6 weeks.   Thornton Papas, MD  10/28/2012  7:39 PM

## 2012-11-06 ENCOUNTER — Encounter: Payer: Self-pay | Admitting: Internal Medicine

## 2012-12-03 ENCOUNTER — Ambulatory Visit (HOSPITAL_BASED_OUTPATIENT_CLINIC_OR_DEPARTMENT_OTHER): Payer: Medicare Other | Admitting: Pharmacist

## 2012-12-03 ENCOUNTER — Telehealth: Payer: Self-pay | Admitting: Oncology

## 2012-12-03 ENCOUNTER — Other Ambulatory Visit (HOSPITAL_BASED_OUTPATIENT_CLINIC_OR_DEPARTMENT_OTHER): Payer: Medicare Other

## 2012-12-03 DIAGNOSIS — I82409 Acute embolism and thrombosis of unspecified deep veins of unspecified lower extremity: Secondary | ICD-10-CM

## 2012-12-03 DIAGNOSIS — C189 Malignant neoplasm of colon, unspecified: Secondary | ICD-10-CM

## 2012-12-03 DIAGNOSIS — Z86718 Personal history of other venous thrombosis and embolism: Secondary | ICD-10-CM

## 2012-12-03 LAB — PROTIME-INR: INR: 2.5 (ref 2.00–3.50)

## 2012-12-03 LAB — POCT INR: INR: 2.5

## 2012-12-03 NOTE — Telephone Encounter (Signed)
s.w. pt and advised that the 11.16.14 appt has been moved to 12pm pt ok adn aware

## 2012-12-03 NOTE — Progress Notes (Signed)
INR at goal.  No bleeding or bruising.  Justin Lang took amoxicillin around recent dental appt, but no other medication changes.  Justin Lang has been on current coumadin dose of 7mg  M/W/F and 5mg  other days since beginning of September and will continue this dose.  Will check PT/INR in 1 month with next MD visit.  Justin Lang requested refills for coumadin to be called in, but he has remaining refills on both coumadin 2mg  and 5mg  tablets.

## 2012-12-29 ENCOUNTER — Other Ambulatory Visit: Payer: Self-pay | Admitting: *Deleted

## 2012-12-29 ENCOUNTER — Telehealth: Payer: Self-pay | Admitting: *Deleted

## 2012-12-29 DIAGNOSIS — C189 Malignant neoplasm of colon, unspecified: Secondary | ICD-10-CM

## 2012-12-29 NOTE — Telephone Encounter (Signed)
Call from pt's wife requesting to check potassium level during lab visit 12/16? Pt no longer taking Potassium supplement, wife wonders if this is what's making him feel bad. Reviewed with Dr. Truett Perna, OK to check potassium.

## 2012-12-30 ENCOUNTER — Telehealth: Payer: Self-pay | Admitting: Oncology

## 2012-12-30 ENCOUNTER — Ambulatory Visit: Payer: Medicare Other | Admitting: Oncology

## 2012-12-30 ENCOUNTER — Ambulatory Visit: Payer: Medicare Other | Admitting: Pharmacist

## 2012-12-30 ENCOUNTER — Other Ambulatory Visit (HOSPITAL_BASED_OUTPATIENT_CLINIC_OR_DEPARTMENT_OTHER): Payer: Medicare Other

## 2012-12-30 ENCOUNTER — Ambulatory Visit (HOSPITAL_BASED_OUTPATIENT_CLINIC_OR_DEPARTMENT_OTHER): Payer: Medicare Other | Admitting: Oncology

## 2012-12-30 VITALS — BP 148/71 | HR 59 | Temp 98.0°F | Resp 18 | Ht 66.0 in | Wt 173.1 lb

## 2012-12-30 DIAGNOSIS — C189 Malignant neoplasm of colon, unspecified: Secondary | ICD-10-CM

## 2012-12-30 DIAGNOSIS — Z7901 Long term (current) use of anticoagulants: Secondary | ICD-10-CM

## 2012-12-30 DIAGNOSIS — C180 Malignant neoplasm of cecum: Secondary | ICD-10-CM

## 2012-12-30 DIAGNOSIS — C786 Secondary malignant neoplasm of retroperitoneum and peritoneum: Secondary | ICD-10-CM

## 2012-12-30 DIAGNOSIS — I82409 Acute embolism and thrombosis of unspecified deep veins of unspecified lower extremity: Secondary | ICD-10-CM

## 2012-12-30 DIAGNOSIS — Z86718 Personal history of other venous thrombosis and embolism: Secondary | ICD-10-CM

## 2012-12-30 LAB — PROTIME-INR
INR: 2 (ref 2.00–3.50)
Protime: 24 Seconds — ABNORMAL HIGH (ref 10.6–13.4)

## 2012-12-30 LAB — BASIC METABOLIC PANEL (CC13)
CO2: 26 mEq/L (ref 22–29)
Calcium: 8.9 mg/dL (ref 8.4–10.4)
Glucose: 86 mg/dl (ref 70–140)
Potassium: 4.3 mEq/L (ref 3.5–5.1)
Sodium: 139 mEq/L (ref 136–145)

## 2012-12-30 LAB — POCT INR: INR: 2

## 2012-12-30 NOTE — Telephone Encounter (Signed)
Gave pt appt for lab and MD, in 3 months

## 2012-12-30 NOTE — Progress Notes (Signed)
INR = 2    Goal range 2-3 Patient has been stable at current dose of Coumadin 5 mg daily except 7 mg MWF. No bleeding/bruising. No new medications. Will continue current dose and see patient in 1 month on 01/28/13 at 9:30 for lab and 9:45 for Coumadin clinic.  Cletis Athens, PharmD

## 2012-12-30 NOTE — Progress Notes (Signed)
   [facility]    OFFICE PROGRESS NOTE   INTERVAL HISTORY:   Mr. Nack returns for scheduled followup of colon cancer. Good appetite. No consistent pain. Occasional "back "pain. He goes to church twice per week and gets out of the house daily. His wife reports that he has difficulty with memory.  Objective:  Vital signs in last 24 hours:  Blood pressure 148/71, pulse 59, temperature 98 F (36.7 C), resp. rate 18, height 5\' 6"  (1.676 m), weight 173 lb 1.6 oz (78.518 kg).    HEENT: Neck without mass Lymphatics: No cervical, supraclavicular, or inguinal nodes Resp: Lungs clear bilaterally Cardio: Regular rate and rhythm GI: No hepatomegaly, no apparent ascites, no mass Vascular: Bilateral low leg varicosities, the left lower leg is larger than the right side Skin: Multiple benign appearing moles over the trunk and face,? Seborrheic keratosis at the left frontal/temporal area     Lab Results:  PT/INR 2.0  Potassium 4.3, creatinine 1.7   Medications: I have reviewed the patient's current medications.  Assessment/Plan: 1. Metastatic colorectal cancer, status post resection of a cecum/appendiceal mass in November 2013 (T4 N0), K-ras wild-type  -Biopsy of a peritoneal nodule 07/04/2012 confirmed metastatic colorectal cancer  -cycle 1 panitumumab 08/20/2012  2. Sigmoid colon cancer in 1997, status post a partial course of adjuvant chemotherapy (? 5-FU/leucovorin), discontinued secondary to a cardiovascular event  3. Resection of a periaortic lymph node in 2002 with the pathology confirming a moderately differentiated carcinoma  4. History of a DVT/pulmonary embolism, status post IVC filter placement, maintained on Coumadin , [facility]  5. Arrhythmia, status post pacemaker placement  Disposition:  There is no clinical evidence of progressive colon cancer. The plan is to continue an observation approach. Mr. Schippers and his  family will contact us if he develops new symptoms. He will return for an office visit in 3 months. He continues management of Coumadin anticoagulation at the [facility] anticoagulation clinic.   Thornton Papas, MD  12/30/2012  1:28 PM

## 2013-01-16 ENCOUNTER — Ambulatory Visit (INDEPENDENT_AMBULATORY_CARE_PROVIDER_SITE_OTHER): Payer: Medicare Other | Admitting: *Deleted

## 2013-01-16 DIAGNOSIS — I495 Sick sinus syndrome: Secondary | ICD-10-CM

## 2013-01-21 LAB — MDC_IDC_ENUM_SESS_TYPE_REMOTE
Brady Statistic AP VS Percent: 52.11 %
Brady Statistic AS VS Percent: 44.79 %
Date Time Interrogation Session: 20150102164937
Lead Channel Impedance Value: 384 Ohm
Lead Channel Impedance Value: 440 Ohm
Lead Channel Sensing Intrinsic Amplitude: 1.3147
Lead Channel Sensing Intrinsic Amplitude: 6.2784
Lead Channel Setting Pacing Amplitude: 2 V
Lead Channel Setting Pacing Amplitude: 2.5 V
Lead Channel Setting Pacing Pulse Width: 0.4 ms
Lead Channel Setting Sensing Sensitivity: 0.9 mV
MDC IDC MSMT BATTERY VOLTAGE: 2.99 V
MDC IDC STAT BRADY AP VP PERCENT: 2.12 %
MDC IDC STAT BRADY AS VP PERCENT: 0.98 %
MDC IDC STAT BRADY RA PERCENT PACED: 54.23 %
MDC IDC STAT BRADY RV PERCENT PACED: 3.1 %
Zone Setting Detection Interval: 350 ms
Zone Setting Detection Interval: 400 ms

## 2013-01-28 ENCOUNTER — Other Ambulatory Visit (HOSPITAL_BASED_OUTPATIENT_CLINIC_OR_DEPARTMENT_OTHER): Payer: Medicare Other

## 2013-01-28 ENCOUNTER — Ambulatory Visit (HOSPITAL_BASED_OUTPATIENT_CLINIC_OR_DEPARTMENT_OTHER): Payer: Medicare Other | Admitting: Pharmacist

## 2013-01-28 ENCOUNTER — Encounter: Payer: Self-pay | Admitting: *Deleted

## 2013-01-28 DIAGNOSIS — C189 Malignant neoplasm of colon, unspecified: Secondary | ICD-10-CM

## 2013-01-28 DIAGNOSIS — Z86718 Personal history of other venous thrombosis and embolism: Secondary | ICD-10-CM

## 2013-01-28 DIAGNOSIS — I82409 Acute embolism and thrombosis of unspecified deep veins of unspecified lower extremity: Secondary | ICD-10-CM

## 2013-01-28 LAB — PROTIME-INR
INR: 2.2 (ref 2.00–3.50)
Protime: 26.4 Seconds — ABNORMAL HIGH (ref 10.6–13.4)

## 2013-01-28 LAB — POCT INR: INR: 2.2

## 2013-01-28 NOTE — Patient Instructions (Signed)
INR at goal Continue 5 mg daily except 7 mg on Mondays, Weds, and Fridays . Return 02/25/13: lab at 9:30 am and coumadin clinic at 9:45.

## 2013-01-28 NOTE — Progress Notes (Signed)
INR at goal Pt is doing well, seen today with his wife Pt has memory issues but wife is a good historian and his caregiver (she sets out his coumadin for him each evening at supper) Pt reports no changes and no issues regarding anticoagulation No bruising or bleeding No missed or extra doses Appetite is still good (no diet changes) No new or changed medications Will make no changes to his coumadin regimen Continue 5 mg daily except 7 mg on Mondays, Wednesdays, and Fridays . Return 02/25/13: lab at 9:30 am and coumadin clinic at 9:45.

## 2013-02-02 ENCOUNTER — Other Ambulatory Visit (HOSPITAL_COMMUNITY): Payer: Self-pay | Admitting: Oncology

## 2013-02-02 ENCOUNTER — Other Ambulatory Visit: Payer: Self-pay | Admitting: Pharmacist

## 2013-02-02 DIAGNOSIS — I82409 Acute embolism and thrombosis of unspecified deep veins of unspecified lower extremity: Secondary | ICD-10-CM

## 2013-02-02 MED ORDER — WARFARIN SODIUM 2 MG PO TABS: 2.0000 mg | ORAL_TABLET | ORAL | Status: DC

## 2013-02-02 NOTE — Telephone Encounter (Addendum)
Prescription refill on Warfarin 2mg  tablets called [facility] [address] of [address] Phone: [phone]

## 2013-02-03 ENCOUNTER — Encounter: Payer: Self-pay | Admitting: Internal Medicine

## 2013-02-25 ENCOUNTER — Other Ambulatory Visit: Payer: Medicare Other

## 2013-02-25 ENCOUNTER — Ambulatory Visit (HOSPITAL_BASED_OUTPATIENT_CLINIC_OR_DEPARTMENT_OTHER): Payer: Medicare Other | Admitting: Pharmacist

## 2013-02-25 DIAGNOSIS — I2699 Other pulmonary embolism without acute cor pulmonale: Secondary | ICD-10-CM

## 2013-02-25 DIAGNOSIS — Z86718 Personal history of other venous thrombosis and embolism: Secondary | ICD-10-CM

## 2013-02-25 DIAGNOSIS — Z7901 Long term (current) use of anticoagulants: Secondary | ICD-10-CM

## 2013-02-25 DIAGNOSIS — C189 Malignant neoplasm of colon, unspecified: Secondary | ICD-10-CM

## 2013-02-25 LAB — PROTIME-INR
INR: 3.1 (ref 2.00–3.50)
Protime: 37.2 Seconds — ABNORMAL HIGH (ref 10.6–13.4)

## 2013-02-25 LAB — POCT INR: INR: 3.1

## 2013-02-25 NOTE — Patient Instructions (Signed)
Continue 5mg  daily except 7mg  on Mondays, Weds, and Fridays . Return 03/05/13: lab at 10am and coumadin clinic at 10:15am.

## 2013-02-25 NOTE — Progress Notes (Signed)
INR slightly above goal. Pt doing well. No concerns regarding anticoagulation. No s/s of clotting noted. No changes in medications. Pt may have missed one dose in the last 10 days.  Pt and wife couldn't remember if he took coumadin and didn't want to double dose in case he had already taken it. Pt not eating as much. He doesn't feel like it. Will recheck INR in one week since diet/appetite is decreased and INR is slightly elevated. Continue 5mg  daily except 7mg  on Mondays, Weds, and Fridays . Return 03/05/13: lab at 10am and coumadin clinic at 10:15am. Will evaluate INR and adjust coumadin dose as needed.

## 2013-03-05 ENCOUNTER — Other Ambulatory Visit (HOSPITAL_BASED_OUTPATIENT_CLINIC_OR_DEPARTMENT_OTHER): Payer: Medicare Other

## 2013-03-05 ENCOUNTER — Telehealth: Payer: Self-pay | Admitting: *Deleted

## 2013-03-05 ENCOUNTER — Ambulatory Visit (HOSPITAL_BASED_OUTPATIENT_CLINIC_OR_DEPARTMENT_OTHER): Payer: Medicare Other | Admitting: Pharmacist

## 2013-03-05 DIAGNOSIS — Z86718 Personal history of other venous thrombosis and embolism: Secondary | ICD-10-CM

## 2013-03-05 DIAGNOSIS — I82409 Acute embolism and thrombosis of unspecified deep veins of unspecified lower extremity: Secondary | ICD-10-CM

## 2013-03-05 DIAGNOSIS — Z7901 Long term (current) use of anticoagulants: Secondary | ICD-10-CM

## 2013-03-05 DIAGNOSIS — C189 Malignant neoplasm of colon, unspecified: Secondary | ICD-10-CM

## 2013-03-05 LAB — PROTIME-INR
INR: 3.9 — AB (ref 2.00–3.50)
Protime: 46.8 Seconds — ABNORMAL HIGH (ref 10.6–13.4)

## 2013-03-05 LAB — POCT INR: INR: 3.9

## 2013-03-05 NOTE — Telephone Encounter (Signed)
Spoke with pt during Coumadin clinic today.  Pt reports diarrhea x3-4 times yesterday and noticed some blood in stool.  Pt eating/drinking well, taking Imodium for the diarrhea and wanted to know if he could be seen earlier than 03/26/13.  Discussed with Dr. Benay Spice.  Called and spoke with pt's wife and informed her per MD that he would be glad to see pt but stated he should call his GI doctor first and see what he thinks.  Instructed if diarrhea getting worse over weekend and continues to see blood to go to ED.  Pt's wife verbalized understanding and expressed appreciation for call back.

## 2013-03-05 NOTE — Progress Notes (Signed)
Pt seen in  Clinic today INR=3.9 Pt wife states he has had a decrease in his appetite lately and three days of constant diarrhea They are taking immodium but not seeing complete (or any) resolution She request to see Lattie Haw or MD sooner than March 12 appmt Concerned cancer may be cause of these latest changes. Spoke with Amy (nurse) and she will call them later if appmt can be moved up sooner. We will hold dose today and decrease weekly dose slightly Weekly dose will be 7 mg on Mon and Fri and 5 mg other days RTC in 10 days 03/16/13 at 9:30 lab and 9:45 coumadin clinic.

## 2013-03-05 NOTE — Patient Instructions (Signed)
Hold dose today then change to 5mg  daily except 7mg  on Mondays and Fridays . Return 03/16/13: lab at 9:30am and coumadin clinic at 9:45am.

## 2013-03-06 ENCOUNTER — Telehealth: Payer: Self-pay | Admitting: Oncology

## 2013-03-06 NOTE — Telephone Encounter (Signed)
r/s due to MD PAL to [facility] , pt aware

## 2013-03-13 ENCOUNTER — Telehealth: Payer: Self-pay | Admitting: Oncology

## 2013-03-13 ENCOUNTER — Encounter (INDEPENDENT_AMBULATORY_CARE_PROVIDER_SITE_OTHER): Payer: Self-pay

## 2013-03-13 ENCOUNTER — Ambulatory Visit (HOSPITAL_BASED_OUTPATIENT_CLINIC_OR_DEPARTMENT_OTHER): Payer: Medicare Other | Admitting: Oncology

## 2013-03-13 ENCOUNTER — Ambulatory Visit (HOSPITAL_BASED_OUTPATIENT_CLINIC_OR_DEPARTMENT_OTHER): Payer: Medicare Other

## 2013-03-13 ENCOUNTER — Telehealth: Payer: Self-pay | Admitting: *Deleted

## 2013-03-13 VITALS — BP 130/73 | HR 61 | Temp 98.1°F | Resp 17 | Ht 66.0 in | Wt 175.4 lb

## 2013-03-13 DIAGNOSIS — C786 Secondary malignant neoplasm of retroperitoneum and peritoneum: Secondary | ICD-10-CM

## 2013-03-13 DIAGNOSIS — M549 Dorsalgia, unspecified: Secondary | ICD-10-CM

## 2013-03-13 DIAGNOSIS — Z7901 Long term (current) use of anticoagulants: Secondary | ICD-10-CM

## 2013-03-13 DIAGNOSIS — Z86718 Personal history of other venous thrombosis and embolism: Secondary | ICD-10-CM

## 2013-03-13 DIAGNOSIS — C189 Malignant neoplasm of colon, unspecified: Secondary | ICD-10-CM

## 2013-03-13 DIAGNOSIS — C180 Malignant neoplasm of cecum: Secondary | ICD-10-CM

## 2013-03-13 LAB — PROTIME-INR
INR: 2.7 (ref 2.00–3.50)
PROTIME: 32.4 s — AB (ref 10.6–13.4)

## 2013-03-13 MED ORDER — OXYCODONE-ACETAMINOPHEN 5-325 MG PO TABS: 1.0000 | ORAL_TABLET | ORAL | Status: DC | PRN

## 2013-03-13 NOTE — Telephone Encounter (Signed)
Ct will be scheduled on 3/2 per Dr. Benay Spice, lab put in @ 3:15 per Charmaine Downs request

## 2013-03-13 NOTE — Telephone Encounter (Signed)
Wife left VM asking nurse to call them. Justin Lang has "severe right back pain". Called back and spoke with patient: Pain at rest is rated "5" and when he moves goes up to "10". Pain has been progressing over past week. Located in right lower back, "near my hip, but not in my hip". Denies any activity that would have caused this pain. Denies any numbness/weakness in leg. Has bowel and bladder control. Took a Percocet that he had left and it helped some. Reports he only has #2 left. Adds that he is still having diarrhea, but does not see any blood. They never called GI physician, but he says they did call Dr. Arnoldo Morale (surgeon), but he can't recall what they were told.

## 2013-03-13 NOTE — Telephone Encounter (Signed)
Called wife to offer appointment for today at 3:45 with Dr. Benay Spice. They agree to come in.

## 2013-03-13 NOTE — Progress Notes (Signed)
   [facility]    OFFICE PROGRESS NOTE   INTERVAL HISTORY:   Justin Lang presents for an unscheduled visit. He complains of progressive right back pain for the past week. He denies trauma. The pain is partially relieved with oxycodone. No hematuria or fever. He reports chronic fecal and urinary urgency.  He continues Coumadin anticoagulation. He had diarrhea with some blood on 03/05/2013. He continues to have intermittent diarrhea. No bleeding.  Objective:  Vital signs in last 24 hours:  Blood pressure 130/73, pulse 61, temperature 98.1 F (36.7 C), temperature source Oral, resp. rate 17, height _0  (1.676 m), weight 175 lb 6.4 oz (79.561 kg), SpO2 95.00%.   Resp: Lungs clear bilaterally Cardio: Regular rate and rhythm GI: No hepatomegaly, no mass, nontender Vascular: Trace low pretibial edema bilaterally Neuro: The motor exam is intact in the legs and feet bilaterally  Skin: Few ecchymoses over the extremities with areas of dryness and excoriation at the lower legs. No rash over the back or right flank. Musculoskeletal: The area of pain is at the right flank. Mild tenderness with percussion in this area. No spine tenderness.     Lab Results:  PT/INR 2.7   Medications: I have reviewed the patient's current medications.  Assessment/Plan: 1. Metastatic colorectal cancer, status post resection of a cecum/appendiceal mass in November 2013 (T4 N0), K-ras wild-type  -Biopsy of a peritoneal nodule 07/04/2012 confirmed metastatic colorectal cancer  -cycle 1 panitumumab 08/20/2012  2. Sigmoid colon cancer in 1997, status post a partial course of adjuvant chemotherapy (? 5-FU/leucovorin), discontinued secondary to a cardiovascular event  3. Resection of a periaortic lymph node in 2002 with the pathology confirming a moderately differentiated carcinoma  4. History of a DVT/pulmonary embolism, status post IVC filter placement, maintained on Coumadin , followed at the  [place]  5. Arrhythmia, status post pacemaker placement  6. Right flank pain-etiology unclear.   Disposition:  [name] has developed severe pain at the right flank area. The pain could be related to a compression fracture, metastatic carcinoma, or a retroperitoneal bleed. The PT/INR is therapeutic today after being supratherapeutic last week. He will continue Coumadin at the current dose. We prescribed oxycodone for pain. He knows to contact us for increased pain or neurologic symptoms. He will be scheduled for a CT of the abdomen and pelvis on 03/16/2013.  We will schedule the next office visit based on the CT result.   Betsy Coder, MD  03/13/2013  4:21 PM

## 2013-03-16 ENCOUNTER — Other Ambulatory Visit (HOSPITAL_BASED_OUTPATIENT_CLINIC_OR_DEPARTMENT_OTHER): Payer: Medicare Other

## 2013-03-16 ENCOUNTER — Encounter (HOSPITAL_COMMUNITY): Payer: Self-pay

## 2013-03-16 ENCOUNTER — Ambulatory Visit (HOSPITAL_BASED_OUTPATIENT_CLINIC_OR_DEPARTMENT_OTHER): Payer: Medicare Other | Admitting: Pharmacist

## 2013-03-16 ENCOUNTER — Ambulatory Visit (HOSPITAL_COMMUNITY)
Admission: RE | Admit: 2013-03-16 | Discharge: 2013-03-16 | Disposition: A | Discharge status: Home / Self Care | Payer: Medicare Other | Source: Ambulatory Visit | Attending: Oncology | Admitting: Oncology

## 2013-03-16 DIAGNOSIS — K5730 Diverticulosis of large intestine without perforation or abscess without bleeding: Secondary | ICD-10-CM | POA: Insufficient documentation

## 2013-03-16 DIAGNOSIS — Z85038 Personal history of other malignant neoplasm of large intestine: Secondary | ICD-10-CM | POA: Insufficient documentation

## 2013-03-16 DIAGNOSIS — N200 Calculus of kidney: Secondary | ICD-10-CM | POA: Insufficient documentation

## 2013-03-16 DIAGNOSIS — Z86718 Personal history of other venous thrombosis and embolism: Secondary | ICD-10-CM

## 2013-03-16 DIAGNOSIS — R109 Unspecified abdominal pain: Secondary | ICD-10-CM | POA: Insufficient documentation

## 2013-03-16 DIAGNOSIS — Z7901 Long term (current) use of anticoagulants: Secondary | ICD-10-CM

## 2013-03-16 DIAGNOSIS — C189 Malignant neoplasm of colon, unspecified: Secondary | ICD-10-CM

## 2013-03-16 DIAGNOSIS — N210 Calculus in bladder: Secondary | ICD-10-CM | POA: Insufficient documentation

## 2013-03-16 DIAGNOSIS — Z950 Presence of cardiac pacemaker: Secondary | ICD-10-CM | POA: Insufficient documentation

## 2013-03-16 LAB — PROTIME-INR
INR: 2.3 (ref 2.00–3.50)
Protime: 27.6 Seconds — ABNORMAL HIGH (ref 10.6–13.4)

## 2013-03-16 LAB — POCT INR: INR: 2.3

## 2013-03-16 NOTE — Progress Notes (Signed)
INR = 2.3 Pt was instructed at last visit on 2/19 to hold Coumadin & then take 5 mg/day except 7 mg on Mon/Fri. Pts wife was confused & she thought pt was told to hold every Thurs dose so she held her husband's Coumadin dose on 2/26 as well.  So he has received 34 mg/week total for the past 2 weeks. He is having diarrhea.  No hematochezia. No other bleeding. Pt was seen, unscheduled, on 2/27 by Dr. Benay Spice (INR drawn that day was 2.7, notably) due to increased back pain.  Pt is going for CT scan today to r/o dz progression. INR at goal today.  I have changed pts Coumadin to 5 mg daily (= 35 mg/week; just a slight bit higher than his current dose). Return 04/01/13 for next protime check. I provided pt w/ his oral contrast for CT scan today x 2 bottles along w/ instructions for use. Kennith Center, Pharm.D., CPP 03/16/2013@10 :47 AM

## 2013-03-18 ENCOUNTER — Telehealth: Payer: Self-pay | Admitting: *Deleted

## 2013-03-18 NOTE — Telephone Encounter (Signed)
Called pt with CT scan results. He reports his pain persists. "No better, no worse." Per Dr. Benay Spice: CT shows no explanation for back pain. Instructed pt to continue pain meds, call for worsening pain. He voiced understanding.

## 2013-03-19 ENCOUNTER — Ambulatory Visit: Payer: Medicare Other | Admitting: Oncology

## 2013-03-26 ENCOUNTER — Ambulatory Visit: Payer: Medicare Other | Admitting: Oncology

## 2013-03-26 ENCOUNTER — Other Ambulatory Visit: Payer: Medicare Other

## 2013-04-01 ENCOUNTER — Other Ambulatory Visit (HOSPITAL_BASED_OUTPATIENT_CLINIC_OR_DEPARTMENT_OTHER): Payer: Medicare Other

## 2013-04-01 ENCOUNTER — Ambulatory Visit (HOSPITAL_BASED_OUTPATIENT_CLINIC_OR_DEPARTMENT_OTHER): Payer: Medicare Other | Admitting: Pharmacist

## 2013-04-01 DIAGNOSIS — I82409 Acute embolism and thrombosis of unspecified deep veins of unspecified lower extremity: Secondary | ICD-10-CM

## 2013-04-01 DIAGNOSIS — Z86718 Personal history of other venous thrombosis and embolism: Secondary | ICD-10-CM

## 2013-04-01 LAB — PROTIME-INR
INR: 2.2 (ref 2.00–3.50)
PROTIME: 26.4 s — AB (ref 10.6–13.4)

## 2013-04-01 LAB — POCT INR: INR: 2.2

## 2013-04-01 NOTE — Progress Notes (Signed)
INR = 2.2   Goal 2-3 INR remains therapeutic. Patient fell last week and "skinned" his arm. He had a small amount of bleeding and a large bruise which is slowly resolving. No other bleeding/bruising. No medication changes. Patient will continue Coumadin 5 mg daily. He needs RX refill authorization called in, will do this today. He will return on 04/10/13 for lab at 8:30, Coumadin clinic at 8:45, and Dr. Benay Spice at 9:00. He hopes to return to less frequent Coumadin Clinic appointments if INR stable on 3/27.  Theone Murdoch, PharmD

## 2013-04-10 ENCOUNTER — Ambulatory Visit (HOSPITAL_BASED_OUTPATIENT_CLINIC_OR_DEPARTMENT_OTHER): Payer: Medicare Other | Admitting: Oncology

## 2013-04-10 ENCOUNTER — Ambulatory Visit: Payer: Medicare Other | Admitting: Pharmacist

## 2013-04-10 ENCOUNTER — Other Ambulatory Visit (HOSPITAL_BASED_OUTPATIENT_CLINIC_OR_DEPARTMENT_OTHER): Payer: Medicare Other

## 2013-04-10 ENCOUNTER — Telehealth: Payer: Self-pay | Admitting: Oncology

## 2013-04-10 VITALS — BP 144/56 | HR 61 | Temp 97.0°F | Resp 17 | Ht 66.0 in | Wt 172.0 lb

## 2013-04-10 DIAGNOSIS — C189 Malignant neoplasm of colon, unspecified: Secondary | ICD-10-CM

## 2013-04-10 DIAGNOSIS — I82409 Acute embolism and thrombosis of unspecified deep veins of unspecified lower extremity: Secondary | ICD-10-CM

## 2013-04-10 DIAGNOSIS — J90 Pleural effusion, not elsewhere classified: Secondary | ICD-10-CM

## 2013-04-10 DIAGNOSIS — C180 Malignant neoplasm of cecum: Secondary | ICD-10-CM

## 2013-04-10 DIAGNOSIS — C786 Secondary malignant neoplasm of retroperitoneum and peritoneum: Secondary | ICD-10-CM

## 2013-04-10 DIAGNOSIS — Z86718 Personal history of other venous thrombosis and embolism: Secondary | ICD-10-CM

## 2013-04-10 LAB — PROTIME-INR
INR: 1.8 — ABNORMAL LOW (ref 2.00–3.50)
Protime: 21.6 Seconds — ABNORMAL HIGH (ref 10.6–13.4)

## 2013-04-10 LAB — POCT INR: INR: 1.8

## 2013-04-10 NOTE — Progress Notes (Signed)
INR slightly below goal today. No changes in medications. No missed coumadin doses. Right eye is slightly more bloodshot than usual.  Pt to see Dr. Benay Spice today. No other concerns regarding anticoagulation. No s/s of clotting noted. Pt had one iceberg lettuce salad in the last week. He usually doesn't eat salads. This shouldn't have an effect on his INR. Take 7mg  today.  On 04/11/13, continue 5mg  daily  Recheck in 2 weeks on 04/24/13: lab at 9:30 am and Coumadin clinic at 9:45am.

## 2013-04-10 NOTE — Patient Instructions (Signed)
Take 7mg  today.  On 04/11/13, continue 5mg  daily  Recheck in 2 weeks on 04/24/13: lab at 9:30 am and Coumadin clinic at 9:45am.

## 2013-04-10 NOTE — Telephone Encounter (Signed)
gv pt appt schedule for june.  °

## 2013-04-10 NOTE — Progress Notes (Signed)
  [place] OFFICE PROGRESS NOTE   Diagnosis: Colon cancer  INTERVAL HISTORY:   He returns as scheduled. Justin Lang reports the right-sided abdomen/flank pain he experienced last month has resolved. He has pain in the bilateral low back and hips in the mornings. The pain is better after ambulating. He had a recent fall in the bathroom and scraped the right arm. His wife is concerned he has short-term memory loss. He has malaise.  Objective:  Vital signs in last 24 hours:  Blood pressure 144/56, pulse 61, temperature 97 F (36.1 C), temperature source Oral, resp. rate 17, height _0  (1.676 m), weight 172 lb (78.019 kg), SpO2 96.00%.    HEENT: Neck without mass Lymphatics: No cervical, supraclavicular, or inguinal nodes. Soft mobile 1 cm bilateral axillary node versus prominent fat pads Resp: Lungs clear bilaterally Cardio: Irregular GI: No hepatosplenomegaly, no apparent ascites, nontender, no mass Vascular: No leg edema Musculoskeletal: Tender at the mid thoracic spine.  Lab Results:  Imaging:  CT of the abdomen and pelvis on 03/16/2013, compared to 08/08/2012: A mesenteric or peritoneal nodule in the right lower abdomen has decreased in size. 7 mm hypermetabolic left paracolic nodule is unchanged. Bilateral pleural effusions.  Medications: I have reviewed the patient's current medications.  Assessment/Plan: 1. Metastatic colorectal cancer, status post resection of a cecum/appendiceal mass in November 2013 (T4 N0), K-ras wild-type  -Biopsy of a peritoneal nodule 07/04/2012 confirmed metastatic colorectal cancer  -cycle 1 panitumumab 08/20/2012  -CT abdomen/pelvis 03/16/2013 without evidence of progressive colon cancer 2. Sigmoid colon cancer in 1997, status post a partial course of adjuvant chemotherapy (? 5-FU/leucovorin), discontinued secondary to a cardiovascular event  3. Resection of a periaortic lymph node in 2002 with the pathology confirming a  moderately differentiated carcinoma  4. History of a DVT/pulmonary embolism, status post IVC filter placement, maintained on Coumadin , followed at the [facility]  5. Arrhythmia, status post pacemaker placement    Disposition:  There is no clinical or x-ray evidence for progression of the colon cancer. I suspect the pleural effusions are related to cardiac disease. The low back and hip pain is likely related to arthritis. I recommended he followup with his primary physician for management of the arthritis and evaluation for dementia.  Justin Lang will continue Coumadin anticoagulation with followup at the [facility]. I cautioned him to use safety measures for fall prevention.  He will return for an office visit in 3 months.  Justin Coder, MD  04/10/2013  9:14 AM

## 2013-04-20 ENCOUNTER — Ambulatory Visit (INDEPENDENT_AMBULATORY_CARE_PROVIDER_SITE_OTHER): Payer: Medicare Other | Admitting: *Deleted

## 2013-04-20 DIAGNOSIS — I495 Sick sinus syndrome: Secondary | ICD-10-CM

## 2013-04-20 DIAGNOSIS — Z950 Presence of cardiac pacemaker: Secondary | ICD-10-CM

## 2013-04-20 LAB — MDC_IDC_ENUM_SESS_TYPE_REMOTE
Brady Statistic AP VP Percent: 4.82 %
Brady Statistic AP VS Percent: 51.6 %
Brady Statistic AS VS Percent: 41.1 %
Brady Statistic RA Percent Paced: 56.43 %
Brady Statistic RV Percent Paced: 7.3 %
Date Time Interrogation Session: 20150406144017
Lead Channel Impedance Value: 384 Ohm
Lead Channel Sensing Intrinsic Amplitude: 1.3 mV
Lead Channel Sensing Intrinsic Amplitude: 6.976 mV
Lead Channel Setting Pacing Amplitude: 2 V
Lead Channel Setting Sensing Sensitivity: 0.9 mV
MDC IDC MSMT BATTERY VOLTAGE: 2.99 V
MDC IDC MSMT LEADCHNL RV IMPEDANCE VALUE: 488 Ohm
MDC IDC SET LEADCHNL RV PACING AMPLITUDE: 2.5 V
MDC IDC SET LEADCHNL RV PACING PULSEWIDTH: 0.4 ms
MDC IDC STAT BRADY AS VP PERCENT: 2.48 %
Zone Setting Detection Interval: 350 ms
Zone Setting Detection Interval: 400 ms

## 2013-04-24 ENCOUNTER — Other Ambulatory Visit (HOSPITAL_BASED_OUTPATIENT_CLINIC_OR_DEPARTMENT_OTHER): Payer: Medicare Other

## 2013-04-24 ENCOUNTER — Ambulatory Visit (HOSPITAL_BASED_OUTPATIENT_CLINIC_OR_DEPARTMENT_OTHER): Payer: Medicare Other | Admitting: Pharmacist

## 2013-04-24 DIAGNOSIS — I82409 Acute embolism and thrombosis of unspecified deep veins of unspecified lower extremity: Secondary | ICD-10-CM

## 2013-04-24 DIAGNOSIS — Z86718 Personal history of other venous thrombosis and embolism: Secondary | ICD-10-CM

## 2013-04-24 DIAGNOSIS — C189 Malignant neoplasm of colon, unspecified: Secondary | ICD-10-CM

## 2013-04-24 LAB — PROTIME-INR
INR: 1.9 — ABNORMAL LOW (ref 2.00–3.50)
Protime: 22.8 Seconds — ABNORMAL HIGH (ref 10.6–13.4)

## 2013-04-24 LAB — POCT INR: INR: 1.9

## 2013-04-24 NOTE — Patient Instructions (Signed)
INR right below goal Take 7mg  today. Then, continue 5mg  daily  Recheck in 2 weeks on 05/12/13: lab at 9:30 am and Coumadin clinic at 9:45am.

## 2013-04-24 NOTE — Progress Notes (Signed)
INR right below goal Pt is doing well with no complaints No missed or extra doses No unusual bleeding or bruising Appetite is ok but stable No medication changes Plan: Take 7mg  today. Then, continue 5mg  daily  Recheck in 2-3 weeks on 05/12/13: lab at 9:30 am and Coumadin clinic at 9:45am.

## 2013-04-30 ENCOUNTER — Encounter: Payer: Self-pay | Admitting: *Deleted

## 2013-05-12 ENCOUNTER — Encounter: Payer: Self-pay | Admitting: Internal Medicine

## 2013-05-12 ENCOUNTER — Ambulatory Visit (HOSPITAL_BASED_OUTPATIENT_CLINIC_OR_DEPARTMENT_OTHER): Payer: Medicare Other | Admitting: Pharmacist

## 2013-05-12 ENCOUNTER — Other Ambulatory Visit (HOSPITAL_BASED_OUTPATIENT_CLINIC_OR_DEPARTMENT_OTHER): Payer: Medicare Other

## 2013-05-12 DIAGNOSIS — I82409 Acute embolism and thrombosis of unspecified deep veins of unspecified lower extremity: Secondary | ICD-10-CM

## 2013-05-12 DIAGNOSIS — C189 Malignant neoplasm of colon, unspecified: Secondary | ICD-10-CM

## 2013-05-12 LAB — PROTIME-INR
INR: 1.9 — AB (ref 2.00–3.50)
Protime: 22.8 Seconds — ABNORMAL HIGH (ref 10.6–13.4)

## 2013-05-12 LAB — POCT INR: INR: 1.9

## 2013-05-12 MED ORDER — WARFARIN SODIUM 5 MG PO TABS: ORAL_TABLET | ORAL | Status: DC

## 2013-05-12 NOTE — Progress Notes (Signed)
INR = 1.9 on Coumadin 5 mg daily. Pt has not missed any doses of Coumadin.  His wife makes sure he has his dose before supper every evening.  His wife states pt is out of 5 mg tablets & needs a refill. Pt has upcoming routine dental work (~05/21/13) & will go on Cephalexin day before, day of & day after dental appt.  He takes this for h/o knee replacement. No bleeding or complications re: anticoag. INR low.  We've "boosted" him w/ 7 mg at last 2 visits & still have not obtained INR at goal so I'm increasing his dose of Coumadin to 5 mg/day except 7 mg on Tues/Thurs. When he has dental work & is on abx, he'll just take Coumadin 5 mg those days.  After he completes the abx, he'll resume the higher dose of Coumadin above. [facility] [address] [facility]. Return in 2 weeks for next INR check. Kennith Center, Pharm.D., CPP 05/12/2013@10 :13 AM

## 2013-05-26 ENCOUNTER — Ambulatory Visit: Payer: Medicare Other

## 2013-05-26 ENCOUNTER — Other Ambulatory Visit: Payer: Medicare Other

## 2013-05-27 ENCOUNTER — Other Ambulatory Visit (HOSPITAL_BASED_OUTPATIENT_CLINIC_OR_DEPARTMENT_OTHER): Payer: Medicare Other

## 2013-05-27 ENCOUNTER — Ambulatory Visit (HOSPITAL_BASED_OUTPATIENT_CLINIC_OR_DEPARTMENT_OTHER): Payer: Medicare Other | Admitting: Pharmacist

## 2013-05-27 DIAGNOSIS — Z86718 Personal history of other venous thrombosis and embolism: Secondary | ICD-10-CM

## 2013-05-27 DIAGNOSIS — I82409 Acute embolism and thrombosis of unspecified deep veins of unspecified lower extremity: Secondary | ICD-10-CM

## 2013-05-27 DIAGNOSIS — C189 Malignant neoplasm of colon, unspecified: Secondary | ICD-10-CM

## 2013-05-27 LAB — PROTIME-INR
INR: 2.4 (ref 2.00–3.50)
Protime: 28.8 Seconds — ABNORMAL HIGH (ref 10.6–13.4)

## 2013-05-27 LAB — POCT INR: INR: 2.4

## 2013-05-27 NOTE — Progress Notes (Signed)
INR = 2.4  Goal 2-3 INR is therapeutic. No complications of anticoagulation noted. Patient will have another dental procedure next week (fillling) and will start cephalexin again on Monday 5/18. Continue Coumadin 5 mg daily except 7 mg on Tues/Thurs. On the days he is taking the antibiotic, he will take 5 MG as with last dental procedure. He and his wife are familiar with this dosing around antibiotics, they did this prior to a dental cleaning a few weeks ago. Recheck in 3 weeks on 06/17/13: lab at 9:30 am and Coumadin clinic at 9:45 am. They will call if the have any problems or concerns in the meantime.  Theone Murdoch, PharmD

## 2013-06-17 ENCOUNTER — Other Ambulatory Visit (HOSPITAL_BASED_OUTPATIENT_CLINIC_OR_DEPARTMENT_OTHER): Payer: Medicare Other

## 2013-06-17 ENCOUNTER — Ambulatory Visit (HOSPITAL_BASED_OUTPATIENT_CLINIC_OR_DEPARTMENT_OTHER): Payer: Medicare Other | Admitting: Pharmacist

## 2013-06-17 DIAGNOSIS — Z86718 Personal history of other venous thrombosis and embolism: Secondary | ICD-10-CM

## 2013-06-17 DIAGNOSIS — I82409 Acute embolism and thrombosis of unspecified deep veins of unspecified lower extremity: Secondary | ICD-10-CM

## 2013-06-17 DIAGNOSIS — Z7901 Long term (current) use of anticoagulants: Secondary | ICD-10-CM

## 2013-06-17 DIAGNOSIS — C189 Malignant neoplasm of colon, unspecified: Secondary | ICD-10-CM

## 2013-06-17 LAB — POCT INR: INR: 1.8

## 2013-06-17 LAB — PROTIME-INR
INR: 1.8 — AB (ref 2.00–3.50)
Protime: 21.6 Seconds — ABNORMAL HIGH (ref 10.6–13.4)

## 2013-06-17 NOTE — Progress Notes (Signed)
INR = 1.8 on Coumadin 5 mg daily except 7 mg on Tu/Th. Pt had dental procedure (2 fillings) approx 1 1/2 weeks ago.  During that time he took Coumadin 5 mg daily due to being on Cephalexin. His wife confirms now that he has completed abx & has resumed his usual Coumadin dose. No sxs of VTE. INR slightly below goal.  Possibly due to lower Coumadin doses recently. No change today to Coumadin dose. Return 6/26 (same day as MD visit). Kennith Center, Pharm.D., CPP 06/17/2013@9 :57 AM

## 2013-06-22 ENCOUNTER — Telehealth: Payer: Self-pay | Admitting: *Deleted

## 2013-06-22 NOTE — Telephone Encounter (Signed)
Message from pt's wife reporting he is short of breath. Called pt, he reports intermittent dyspnea at rest. Denies chest pain. Recommended he call his PCP to be worked in today. Reviewed with Dr. Benay Spice: Pt to see PCP for dyspnea.

## 2013-07-10 ENCOUNTER — Ambulatory Visit (HOSPITAL_BASED_OUTPATIENT_CLINIC_OR_DEPARTMENT_OTHER): Payer: Medicare Other | Admitting: Oncology

## 2013-07-10 ENCOUNTER — Other Ambulatory Visit (HOSPITAL_BASED_OUTPATIENT_CLINIC_OR_DEPARTMENT_OTHER): Payer: Medicare Other

## 2013-07-10 ENCOUNTER — Ambulatory Visit (HOSPITAL_BASED_OUTPATIENT_CLINIC_OR_DEPARTMENT_OTHER): Payer: Medicare Other | Admitting: Pharmacist

## 2013-07-10 ENCOUNTER — Encounter: Payer: Medicare Other | Admitting: Internal Medicine

## 2013-07-10 VITALS — BP 124/64 | HR 64 | Temp 97.6°F | Resp 18 | Ht 66.0 in | Wt 169.2 lb

## 2013-07-10 DIAGNOSIS — Z86718 Personal history of other venous thrombosis and embolism: Secondary | ICD-10-CM

## 2013-07-10 DIAGNOSIS — C189 Malignant neoplasm of colon, unspecified: Secondary | ICD-10-CM

## 2013-07-10 DIAGNOSIS — Z86711 Personal history of pulmonary embolism: Secondary | ICD-10-CM

## 2013-07-10 DIAGNOSIS — C786 Secondary malignant neoplasm of retroperitoneum and peritoneum: Secondary | ICD-10-CM

## 2013-07-10 DIAGNOSIS — C180 Malignant neoplasm of cecum: Secondary | ICD-10-CM

## 2013-07-10 DIAGNOSIS — I82409 Acute embolism and thrombosis of unspecified deep veins of unspecified lower extremity: Secondary | ICD-10-CM

## 2013-07-10 LAB — POCT INR: INR: 2.4

## 2013-07-10 LAB — PROTIME-INR
INR: 2.4 (ref 2.00–3.50)
PROTIME: 28.8 s — AB (ref 10.6–13.4)

## 2013-07-10 MED ORDER — WARFARIN SODIUM 5 MG PO TABS: ORAL_TABLET | ORAL | Status: DC

## 2013-07-10 NOTE — Patient Instructions (Signed)
Continue Coumadin 5mg  daily except 7mg  on Tues/Thurs. Recheck on 07/2313: lab at 10:30 am and Coumadin clinic at 10:45am.

## 2013-07-10 NOTE — Progress Notes (Signed)
INR within goal today. Pt took coumadin as instructed. No missed or extra coumadin doses. No changes in medications. Pt has been eating more fruit recently - cantelope and watermelon. No problems or concerns regarding anticoagulation. No s/s of clotting noted. Wife stated pt is more tired lately and he has had "severe diarrhea". She will talk to Dr. Benay Spice about this today. Continue Coumadin 5mg  daily except 7mg  on Tues/Thurs. Recheck on 07/2313: lab at 10:30 am and Coumadin clinic at 10:45am. [facility] [address] Will order CBC for review with next visit.

## 2013-07-10 NOTE — Progress Notes (Signed)
  [facility] OFFICE PROGRESS NOTE   Diagnosis: Colon cancer  INTERVAL HISTORY:   Mr. Self returns as scheduled. He continues to have intermittent back pain and pain at other joints. His mobility is limited. His family reports he has memory loss. He has intermittent diarrhea. He does not take Lomotil consistently. He recently had an episode of blood in the stool. He has a burning discomfort at the perineum after bowel movements. He continues Coumadin anticoagulation.  Objective:  Vital signs in last 24 hours:  Blood pressure 124/64, pulse 64, temperature 97.6 F (36.4 C), temperature source Oral, resp. rate 18, height _0  (1.676 m), weight 169 lb 3.2 oz (76.749 kg), SpO2 99.00%.    HEENT: Neck without mass Lymphatics: No cervical, supra-clavicular, axillary, or inguinal nodes Resp: Lungs clear bilaterally Cardio: Regular rate and rhythm GI: No hepatomegaly, nontender, no mass Vascular: No leg edema Rectal: Diminished tone, no mass, no stool in the rectal vault, no blood, no apparent hemorrhoids, no skin breakdown or rash at the perineum   Medications: I have reviewed the patient's current medications.  Assessment/Plan: 1. Metastatic colorectal cancer, status post resection of a cecum/appendiceal mass in November 2013 (T4 N0), K-ras wild-type  -Biopsy of a peritoneal nodule 07/04/2012 confirmed metastatic colorectal cancer  -cycle 1 panitumumab 08/20/2012  -CT abdomen/pelvis 03/16/2013 without evidence of progressive colon cancer  2. Sigmoid colon cancer in 1997, status post a partial course of adjuvant chemotherapy (? 5-FU/leucovorin), discontinued secondary to a cardiovascular event  3. Resection of a periaortic lymph node in 2002 with the pathology confirming a moderately differentiated carcinoma  4. History of a DVT/pulmonary embolism, status post IVC filter placement, maintained on Coumadin , followed at the [facility]  5. Arrhythmia,  status post pacemaker placement  6. "diarrhea "-potentially related to the bowel resection, I recommended he take Lomotil 1-2 times per day more consistently   Disposition:  There is no clinical evidence for progression of the colon cancer. He will contact us for recurrent rectal bleeding and we will make a GI referral. He will try taking Lomotil consistently to see if this helps with the frequent bowel movements. Mr. Hellmer will return for an office visit in 4 months.  I suspect his pain and arthralgias are related to benign musculoskeletal changes.  Betsy Coder, MD  07/10/2013  11:15 AM

## 2013-07-13 ENCOUNTER — Telehealth: Payer: Self-pay | Admitting: Oncology

## 2013-07-13 NOTE — Telephone Encounter (Signed)
lvm for pt regarding to July adn Sept appt...mailed tp appt sched/avs and letter

## 2013-07-20 ENCOUNTER — Ambulatory Visit (INDEPENDENT_AMBULATORY_CARE_PROVIDER_SITE_OTHER): Payer: Medicare Other | Admitting: Internal Medicine

## 2013-07-20 ENCOUNTER — Encounter: Payer: Self-pay | Admitting: Internal Medicine

## 2013-07-20 VITALS — BP 142/73 | HR 62 | Ht 68.0 in | Wt 168.1 lb

## 2013-07-20 DIAGNOSIS — I495 Sick sinus syndrome: Secondary | ICD-10-CM

## 2013-07-20 DIAGNOSIS — Z950 Presence of cardiac pacemaker: Secondary | ICD-10-CM

## 2013-07-20 LAB — MDC_IDC_ENUM_SESS_TYPE_INCLINIC
Brady Statistic AP VP Percent: 3 %
Brady Statistic AP VS Percent: 56.2 %
Brady Statistic AS VS Percent: 39.32 %
Lead Channel Impedance Value: 400 Ohm
Lead Channel Impedance Value: 496 Ohm
Lead Channel Pacing Threshold Pulse Width: 0.4 ms
Lead Channel Sensing Intrinsic Amplitude: 1.3147
Lead Channel Sensing Intrinsic Amplitude: 8.72 mV
Lead Channel Setting Pacing Pulse Width: 0.4 ms
MDC IDC MSMT BATTERY VOLTAGE: 2.97 V
MDC IDC MSMT LEADCHNL RA PACING THRESHOLD AMPLITUDE: 0.5 V
MDC IDC MSMT LEADCHNL RA PACING THRESHOLD PULSEWIDTH: 0.4 ms
MDC IDC MSMT LEADCHNL RV PACING THRESHOLD AMPLITUDE: 0.5 V
MDC IDC SESS DTM: 20150706112317
MDC IDC SET LEADCHNL RA PACING AMPLITUDE: 2 V
MDC IDC SET LEADCHNL RV PACING AMPLITUDE: 2.5 V
MDC IDC SET LEADCHNL RV SENSING SENSITIVITY: 0.9 mV
MDC IDC SET ZONE DETECTION INTERVAL: 350 ms
MDC IDC STAT BRADY AS VP PERCENT: 1.48 %
MDC IDC STAT BRADY RA PERCENT PACED: 59.2 %
MDC IDC STAT BRADY RV PERCENT PACED: 4.48 %
Zone Setting Detection Interval: 400 ms

## 2013-07-20 NOTE — Assessment & Plan Note (Signed)
His medtronic DDD PM is working normally. Will recheck in several months.

## 2013-07-20 NOTE — Progress Notes (Signed)
HPI Justin Lang returns today for followup. He is a very pleasant 80 year old man with a history of symptomatic bradycardia, status post permanent pacemaker insertion, atrial fibrillation, on systemic anticoagulation. The patient has developed recurrent, metastatic, colon cancer and has received a single round of chemotherapy. In the interim, he feels weakened and fatigued but denies chest pain, shortness of breath, or syncope. He has mild peripheral edema. His appetite is still good. Allergies  Allergen Reactions  . Vectibix [Panitumumab] Other (See Comments)    On 08/20/12 during Vectibix infusion at [facility] developed difficulty swallowing, unable answer questions due to lips/throat feeling "funny", confusion, with no affect. Has difficulty getting words out.  . Celecoxib Rash    CELEBREX     Current Outpatient Prescriptions  Medication Sig Dispense Refill  . cholecalciferol (VITAMIN D) 1000 UNITS tablet Take 1,000 Units by mouth at bedtime.       . diphenoxylate-atropine (LOMOTIL) 2.5-0.025 MG per tablet Take 1 tablet by mouth 4 (four) times daily as needed for diarrhea or loose stools.       Marland Kitchen oxyCODONE-acetaminophen (PERCOCET/ROXICET) 5-325 MG per tablet Take 1-2 tablets by mouth every 4 (four) hours as needed for moderate pain or severe pain.  60 tablet  0  . warfarin (COUMADIN) 2 MG tablet Take 1 tablet (2 mg total) by mouth as directed. Taken with 5mg  tablet on Mon, [place] & Fridays.  30 tablet  1  . warfarin (COUMADIN) 5 MG tablet Take 5 mg by mouth daily except 7 mg on Tues/Thurs or as directed.  30 tablet  3  . promethazine (PHENERGAN) 12.5 MG tablet Take 12.5 mg by mouth every 6 (six) hours as needed for nausea.        No current facility-administered medications for this visit.     Past Medical History  Diagnosis Date  . DVT (deep venous thrombosis)     hx of s/p Greenfield filter  . OAB (overactive bladder)   . Arthritis   . Cataracts, bilateral   . FH: factor V Leiden deficiency    . History of nephrolithiasis   . History of ankle fracture   . 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  . Peripheral neuropathy   . Vitamin B12 deficiency   . Symptomatic bradycardia   . Sinoatrial node dysfunction   . Sinoatrial node dysfunction   . Pacemaker 4 yrs ago  . Colon cancer 07/23/00    hx of metastic- treated with surgery and readiation  . Allergy     celebrex  . History of radiation therapy 09/02/00-10/07/00    recurrent colon cancer/left periaortic nodal recurrence=4500cGy/25 fxs,Dr.Wu  . Hx SBO     multiple intermittent and surgery  . History of chemotherapy     ROS:   All systems reviewed and negative except as noted in the HPI.   Past Surgical History  Procedure Laterality Date  . Colectomy      sigmoid secondary to colon ca- 1997  . Exploratory laparotomy      metastatic retroperitoneal lymph node- 2002  . Total knee arthroplasty      right-1991; left-1992  . Back surgeries      x3  . Filter replacement    . Left ankle surgery    . Incisional herniorraphy    . Pacemaker insertion  05/19/2008  . Replacement total knee bilateral    . Colon resection    . Colonoscopy  08/18/2010    Procedure: COLONOSCOPY;  Surgeon: Mechele Dawley  Laural Golden, MD;  Location: [facility];  Service: Endoscopy;  Laterality: N/A;  . Esophagogastroduodenoscopy  08/18/2010    Procedure: ESOPHAGOGASTRODUODENOSCOPY (EGD);  Surgeon: Rogene Houston, MD;  Location: [facility];  Service: Endoscopy;  Laterality: N/A;  . Partial colectomy  11/28/2011    Procedure: PARTIAL COLECTOMY;  Surgeon: Jamesetta So, MD;  Location: [facility];  Service: General;  Laterality: N/A;  . Laparoscopic ileocecectomy  11/28/11  . Appendectomy       Family History  Problem Relation Age of Onset  . Anesthesia problems Neg Hx   . Hypotension Neg Hx   . Malignant hyperthermia Neg Hx   . Pseudochol deficiency Neg Hx   . Cancer Sister      History   Social History  . Marital Status: Married     Spouse Name: N/A    Number of Children: 6  . Years of Education: N/A   Occupational History  . Not on file.   Social History Main Topics  . Smoking status: Never Smoker   . Smokeless tobacco: Never Used  . Alcohol Use: No  . Drug Use: No  . Sexual Activity: No   Other Topics Concern  . Not on file   Social History Narrative   Married, lives with spouse.      BP 142/73  Pulse 62  Ht 5\' 8"  (1.727 m)  Wt 168 lb 1.9 oz (76.259 kg)  BMI 25.57 kg/m2  Physical Exam:  Chronically ill appearing 80 year old man,NAD HEENT: Unremarkable Neck:  6 cm JVD, no thyromegally Back:  No CVA tenderness Lungs:  Clear with no wheezes, rales, or rhonchi. HEART:  Regular rate rhythm, no murmurs, no rubs, no clicks Abd:  soft, positive bowel sounds, no organomegally, no rebound, no guarding Ext:  2 plus pulses, trace peripheral edema, no cyanosis, no clubbing Skin:  No rashes no nodules Neuro:  CN II through XII intact, motor grossly intact  DEVICE  Normal device function.  See PaceArt for details.   Assess/Plan:

## 2013-07-20 NOTE — Patient Instructions (Signed)
Remote monitoring is used to monitor your Pacemaker of ICD from home. This monitoring reduces the number of office visits required to check your device to one time per year. It allows [place] to keep an eye on the functioning of your device to ensure it is working properly. You are scheduled for a device check from home on October 7 th. You may send your transmission at any time that day. If you have a wireless device, the transmission will be sent automatically. After your physician reviews your transmission, you will receive a postcard with your next transmission date.  Your physician wants you to follow-up in: 1 year with Dr. Knox Saliva will receive a reminder letter in the mail two months in advance. If you don't receive a letter, please call our office to schedule the follow-up appointment.  Your physician recommends that you continue on your current medications as directed. Please refer to the Current Medication list given to you today.

## 2013-07-27 ENCOUNTER — Encounter: Payer: Self-pay | Admitting: Internal Medicine

## 2013-08-06 ENCOUNTER — Ambulatory Visit (HOSPITAL_BASED_OUTPATIENT_CLINIC_OR_DEPARTMENT_OTHER): Payer: Medicare Other | Admitting: Pharmacist

## 2013-08-06 ENCOUNTER — Other Ambulatory Visit (HOSPITAL_BASED_OUTPATIENT_CLINIC_OR_DEPARTMENT_OTHER): Payer: Medicare Other

## 2013-08-06 DIAGNOSIS — I82409 Acute embolism and thrombosis of unspecified deep veins of unspecified lower extremity: Secondary | ICD-10-CM

## 2013-08-06 DIAGNOSIS — Z86711 Personal history of pulmonary embolism: Secondary | ICD-10-CM

## 2013-08-06 DIAGNOSIS — Z86718 Personal history of other venous thrombosis and embolism: Secondary | ICD-10-CM

## 2013-08-06 DIAGNOSIS — C189 Malignant neoplasm of colon, unspecified: Secondary | ICD-10-CM

## 2013-08-06 LAB — CBC WITH DIFFERENTIAL/PLATELET
BASO%: 0.6 % (ref 0.0–2.0)
Basophils Absolute: 0 10*3/uL (ref 0.0–0.1)
EOS ABS: 0.7 10*3/uL — AB (ref 0.0–0.5)
EOS%: 9.6 % — ABNORMAL HIGH (ref 0.0–7.0)
HEMATOCRIT: 42.6 % (ref 38.4–49.9)
HGB: 14.1 g/dL (ref 13.0–17.1)
LYMPH%: 21 % (ref 14.0–49.0)
MCH: 30 pg (ref 27.2–33.4)
MCHC: 33.1 g/dL (ref 32.0–36.0)
MCV: 90.6 fL (ref 79.3–98.0)
MONO#: 0.7 10*3/uL (ref 0.1–0.9)
MONO%: 9.3 % (ref 0.0–14.0)
NEUT#: 4.3 10*3/uL (ref 1.5–6.5)
NEUT%: 59.5 % (ref 39.0–75.0)
PLATELETS: 144 10*3/uL (ref 140–400)
RBC: 4.7 10*6/uL (ref 4.20–5.82)
RDW: 14.9 % — ABNORMAL HIGH (ref 11.0–14.6)
WBC: 7.2 10*3/uL (ref 4.0–10.3)
lymph#: 1.5 10*3/uL (ref 0.9–3.3)

## 2013-08-06 LAB — PROTIME-INR
INR: 1.9 — ABNORMAL LOW (ref 2.00–3.50)
Protime: 22.8 Seconds — ABNORMAL HIGH (ref 10.6–13.4)

## 2013-08-06 LAB — POCT INR: INR: 1.9

## 2013-08-06 MED ORDER — WARFARIN SODIUM 2 MG PO TABS: 2.0000 mg | ORAL_TABLET | ORAL | Status: DC

## 2013-08-06 NOTE — Progress Notes (Signed)
INR right at goal  Pt is doing well with no complaints No missed or extra doses No medication or diet changes No unusual bleeding Minor bruise on arm from bumping it (it is healing) Pt has been stable on current dose Plan: No changes Continue Coumadin 5 mg daily except 7 mg on Tues/Thurs. Recheck on 09/03/13: lab at 10:30 am and Coumadin clinic at 10:45am.

## 2013-08-06 NOTE — Patient Instructions (Signed)
INR at goal  No changes Continue Coumadin 5 mg daily except 7 mg on Tues/Thurs. Recheck on 09/03/13: lab at 10:30 am and Coumadin clinic at 10:45am.

## 2013-08-10 ENCOUNTER — Other Ambulatory Visit: Payer: Self-pay | Admitting: Pharmacist

## 2013-08-10 DIAGNOSIS — I82409 Acute embolism and thrombosis of unspecified deep veins of unspecified lower extremity: Secondary | ICD-10-CM

## 2013-08-10 MED ORDER — WARFARIN SODIUM 5 MG PO TABS: ORAL_TABLET | ORAL | Status: DC

## 2013-09-03 ENCOUNTER — Ambulatory Visit (HOSPITAL_BASED_OUTPATIENT_CLINIC_OR_DEPARTMENT_OTHER): Payer: Medicare Other | Admitting: Pharmacist

## 2013-09-03 ENCOUNTER — Other Ambulatory Visit (HOSPITAL_BASED_OUTPATIENT_CLINIC_OR_DEPARTMENT_OTHER): Payer: Medicare Other

## 2013-09-03 DIAGNOSIS — C189 Malignant neoplasm of colon, unspecified: Secondary | ICD-10-CM

## 2013-09-03 DIAGNOSIS — Z86711 Personal history of pulmonary embolism: Secondary | ICD-10-CM

## 2013-09-03 DIAGNOSIS — I82409 Acute embolism and thrombosis of unspecified deep veins of unspecified lower extremity: Secondary | ICD-10-CM

## 2013-09-03 DIAGNOSIS — Z86718 Personal history of other venous thrombosis and embolism: Secondary | ICD-10-CM

## 2013-09-03 LAB — PROTIME-INR
INR: 2.5 (ref 2.00–3.50)
PROTIME: 30 s — AB (ref 10.6–13.4)

## 2013-09-03 LAB — POCT INR: INR: 2.5

## 2013-09-03 NOTE — Progress Notes (Signed)
INR within goal today. No problems or concerns regarding anticoagulation. No changes in medications or diet. No missed or extra coumadin doses. No unusual bruising noted. No bleeding. No s/s of clotting. Continue Coumadin 5mg  daily except 7mg  on Tues/Thurs. Recheck on 10/12/13: lab at 9am, Coumadin clinic at 9:15am and apt with Dr. Benay Spice at 9:45am.

## 2013-09-03 NOTE — Patient Instructions (Signed)
Continue Coumadin 5mg  daily except 7mg  on Tues/Thurs. Recheck on 10/12/13: lab at 9am, Coumadin clinic at 9:15am and apt with Dr. Benay Spice at 9:45am.

## 2013-10-01 ENCOUNTER — Encounter (HOSPITAL_COMMUNITY): Payer: Self-pay | Admitting: Emergency Medicine

## 2013-10-01 ENCOUNTER — Emergency Department (HOSPITAL_COMMUNITY): Payer: Medicare Other

## 2013-10-01 ENCOUNTER — Inpatient Hospital Stay (HOSPITAL_COMMUNITY)
Admission: EM | Admit: 2013-10-01 | Discharge: 2013-10-11 | DRG: 659 | Disposition: A | Discharge status: Hospice | Payer: Medicare Other | Attending: Internal Medicine | Admitting: Internal Medicine

## 2013-10-01 DIAGNOSIS — J189 Pneumonia, unspecified organism: Secondary | ICD-10-CM

## 2013-10-01 DIAGNOSIS — R7989 Other specified abnormal findings of blood chemistry: Secondary | ICD-10-CM

## 2013-10-01 DIAGNOSIS — I509 Heart failure, unspecified: Secondary | ICD-10-CM | POA: Diagnosis present

## 2013-10-01 DIAGNOSIS — N210 Calculus in bladder: Secondary | ICD-10-CM | POA: Diagnosis present

## 2013-10-01 DIAGNOSIS — N179 Acute kidney failure, unspecified: Secondary | ICD-10-CM | POA: Diagnosis present

## 2013-10-01 DIAGNOSIS — Z85048 Personal history of other malignant neoplasm of rectum, rectosigmoid junction, and anus: Secondary | ICD-10-CM | POA: Diagnosis not present

## 2013-10-01 DIAGNOSIS — I429 Cardiomyopathy, unspecified: Secondary | ICD-10-CM

## 2013-10-01 DIAGNOSIS — R627 Adult failure to thrive: Secondary | ICD-10-CM | POA: Diagnosis present

## 2013-10-01 DIAGNOSIS — Z8546 Personal history of malignant neoplasm of prostate: Secondary | ICD-10-CM

## 2013-10-01 DIAGNOSIS — I472 Ventricular tachycardia, unspecified: Secondary | ICD-10-CM | POA: Diagnosis present

## 2013-10-01 DIAGNOSIS — N1330 Unspecified hydronephrosis: Secondary | ICD-10-CM | POA: Diagnosis present

## 2013-10-01 DIAGNOSIS — Z950 Presence of cardiac pacemaker: Secondary | ICD-10-CM | POA: Diagnosis not present

## 2013-10-01 DIAGNOSIS — I4729 Other ventricular tachycardia: Secondary | ICD-10-CM | POA: Diagnosis present

## 2013-10-01 DIAGNOSIS — T45515A Adverse effect of anticoagulants, initial encounter: Secondary | ICD-10-CM | POA: Diagnosis present

## 2013-10-01 DIAGNOSIS — I428 Other cardiomyopathies: Secondary | ICD-10-CM | POA: Diagnosis present

## 2013-10-01 DIAGNOSIS — I079 Rheumatic tricuspid valve disease, unspecified: Secondary | ICD-10-CM | POA: Diagnosis present

## 2013-10-01 DIAGNOSIS — I495 Sick sinus syndrome: Secondary | ICD-10-CM

## 2013-10-01 DIAGNOSIS — C786 Secondary malignant neoplasm of retroperitoneum and peritoneum: Secondary | ICD-10-CM | POA: Diagnosis present

## 2013-10-01 DIAGNOSIS — I2789 Other specified pulmonary heart diseases: Secondary | ICD-10-CM | POA: Diagnosis present

## 2013-10-01 DIAGNOSIS — I129 Hypertensive chronic kidney disease with stage 1 through stage 4 chronic kidney disease, or unspecified chronic kidney disease: Secondary | ICD-10-CM | POA: Diagnosis present

## 2013-10-01 DIAGNOSIS — N135 Crossing vessel and stricture of ureter without hydronephrosis: Secondary | ICD-10-CM | POA: Diagnosis present

## 2013-10-01 DIAGNOSIS — N269 Renal sclerosis, unspecified: Secondary | ICD-10-CM | POA: Diagnosis present

## 2013-10-01 DIAGNOSIS — R079 Chest pain, unspecified: Secondary | ICD-10-CM | POA: Diagnosis present

## 2013-10-01 DIAGNOSIS — I82409 Acute embolism and thrombosis of unspecified deep veins of unspecified lower extremity: Secondary | ICD-10-CM | POA: Diagnosis present

## 2013-10-01 DIAGNOSIS — Z9221 Personal history of antineoplastic chemotherapy: Secondary | ICD-10-CM

## 2013-10-01 DIAGNOSIS — N189 Chronic kidney disease, unspecified: Secondary | ICD-10-CM | POA: Diagnosis present

## 2013-10-01 DIAGNOSIS — C189 Malignant neoplasm of colon, unspecified: Secondary | ICD-10-CM

## 2013-10-01 DIAGNOSIS — Z79899 Other long term (current) drug therapy: Secondary | ICD-10-CM | POA: Diagnosis not present

## 2013-10-01 DIAGNOSIS — Z66 Do not resuscitate: Secondary | ICD-10-CM | POA: Diagnosis present

## 2013-10-01 DIAGNOSIS — I379 Nonrheumatic pulmonary valve disorder, unspecified: Secondary | ICD-10-CM | POA: Diagnosis present

## 2013-10-01 DIAGNOSIS — I5042 Chronic combined systolic (congestive) and diastolic (congestive) heart failure: Secondary | ICD-10-CM | POA: Diagnosis present

## 2013-10-01 DIAGNOSIS — Z800 Family history of malignant neoplasm of digestive organs: Secondary | ICD-10-CM

## 2013-10-01 DIAGNOSIS — C19 Malignant neoplasm of rectosigmoid junction: Secondary | ICD-10-CM | POA: Diagnosis present

## 2013-10-01 DIAGNOSIS — Z85038 Personal history of other malignant neoplasm of large intestine: Secondary | ICD-10-CM

## 2013-10-01 DIAGNOSIS — Z86718 Personal history of other venous thrombosis and embolism: Secondary | ICD-10-CM | POA: Diagnosis not present

## 2013-10-01 DIAGNOSIS — I059 Rheumatic mitral valve disease, unspecified: Secondary | ICD-10-CM | POA: Diagnosis present

## 2013-10-01 DIAGNOSIS — D6859 Other primary thrombophilia: Secondary | ICD-10-CM | POA: Diagnosis present

## 2013-10-01 DIAGNOSIS — R030 Elevated blood-pressure reading, without diagnosis of hypertension: Secondary | ICD-10-CM

## 2013-10-01 DIAGNOSIS — Z7901 Long term (current) use of anticoagulants: Secondary | ICD-10-CM

## 2013-10-01 DIAGNOSIS — C3490 Malignant neoplasm of unspecified part of unspecified bronchus or lung: Secondary | ICD-10-CM | POA: Diagnosis present

## 2013-10-01 DIAGNOSIS — R413 Other amnesia: Secondary | ICD-10-CM

## 2013-10-01 DIAGNOSIS — R000 Tachycardia, unspecified: Secondary | ICD-10-CM

## 2013-10-01 DIAGNOSIS — Z96659 Presence of unspecified artificial knee joint: Secondary | ICD-10-CM

## 2013-10-01 DIAGNOSIS — N1339 Other hydronephrosis: Secondary | ICD-10-CM

## 2013-10-01 DIAGNOSIS — N138 Other obstructive and reflux uropathy: Secondary | ICD-10-CM

## 2013-10-01 LAB — COMPREHENSIVE METABOLIC PANEL
ALBUMIN: 3.1 g/dL — AB (ref 3.5–5.2)
ALK PHOS: 69 U/L (ref 39–117)
ALT: 8 U/L (ref 0–53)
AST: 13 U/L (ref 0–37)
Anion gap: 17 — ABNORMAL HIGH (ref 5–15)
BUN: 43 mg/dL — ABNORMAL HIGH (ref 6–23)
CO2: 23 mEq/L (ref 19–32)
Calcium: 8.5 mg/dL (ref 8.4–10.5)
Chloride: 103 mEq/L (ref 96–112)
Creatinine, Ser: 5.37 mg/dL — ABNORMAL HIGH (ref 0.50–1.35)
GFR calc Af Amer: 10 mL/min — ABNORMAL LOW (ref >90)
GFR calc non Af Amer: 9 mL/min — ABNORMAL LOW (ref >90)
Glucose, Bld: 150 mg/dL — ABNORMAL HIGH (ref 70–99)
POTASSIUM: 3.4 meq/L — AB (ref 3.7–5.3)
Sodium: 143 mEq/L (ref 137–147)
Total Bilirubin: 0.4 mg/dL (ref 0.3–1.2)
Total Protein: 7.3 g/dL (ref 6.0–8.3)

## 2013-10-01 LAB — CBC
HCT: 36.6 % — ABNORMAL LOW (ref 39.0–52.0)
HEMATOCRIT: 39.9 % (ref 39.0–52.0)
HEMOGLOBIN: 13.4 g/dL (ref 13.0–17.0)
Hemoglobin: 12.5 g/dL — ABNORMAL LOW (ref 13.0–17.0)
MCH: 29.6 pg (ref 26.0–34.0)
MCH: 29.8 pg (ref 26.0–34.0)
MCHC: 33.6 g/dL (ref 30.0–36.0)
MCHC: 34.2 g/dL (ref 30.0–36.0)
MCV: 88.1 fL (ref 78.0–100.0)
MCV: 87.4 fL (ref 78.0–100.0)
Platelets: 249 10*3/uL (ref 150–400)
Platelets: 245 10*3/uL (ref 150–400)
RBC: 4.53 MIL/uL (ref 4.22–5.81)
RBC: 4.19 MIL/uL — AB (ref 4.22–5.81)
RDW: 14.9 % (ref 11.5–15.5)
RDW: 14.9 % (ref 11.5–15.5)
WBC: 8.4 10*3/uL (ref 4.0–10.5)
WBC: 8.1 10*3/uL (ref 4.0–10.5)

## 2013-10-01 LAB — PROTIME-INR
INR: 4.16 — ABNORMAL HIGH (ref 0.00–1.49)
PROTHROMBIN TIME: 40.2 s — AB (ref 11.6–15.2)

## 2013-10-01 LAB — APTT: APTT: 73 s — AB (ref 24–37)

## 2013-10-01 LAB — I-STAT TROPONIN, ED
Troponin i, poc: 0.04 ng/mL (ref 0.00–0.08)
Troponin i, poc: 0.05 ng/mL (ref 0.00–0.08)

## 2013-10-01 LAB — PRO B NATRIURETIC PEPTIDE: Pro B Natriuretic peptide (BNP): 17357 pg/mL — ABNORMAL HIGH (ref 0–450)

## 2013-10-01 LAB — I-STAT CG4 LACTIC ACID, ED: LACTIC ACID, VENOUS: 0.78 mmol/L (ref 0.5–2.2)

## 2013-10-01 MED ORDER — DEXTROSE 5 % IV SOLN
1.0000 g | INTRAVENOUS | Status: AC
  Administered 2013-10-02 – 2013-10-07 (×6): 1 g via INTRAVENOUS
  Filled 2013-10-01 (×6): qty 10

## 2013-10-01 MED ORDER — DEXTROSE 5 % IV SOLN
500.0000 mg | Freq: Once | INTRAVENOUS | Status: AC
  Administered 2013-10-01: 500 mg via INTRAVENOUS
  Filled 2013-10-01: qty 500

## 2013-10-01 MED ORDER — IOHEXOL 300 MG/ML  SOLN
25.0000 mL | INTRAMUSCULAR | Status: AC
  Administered 2013-10-01: 25 mL via ORAL

## 2013-10-01 MED ORDER — DEXTROSE 5 % IV SOLN
500.0000 mg | INTRAVENOUS | Status: AC
  Administered 2013-10-02 – 2013-10-07 (×6): 500 mg via INTRAVENOUS
  Filled 2013-10-01 (×6): qty 500

## 2013-10-01 MED ORDER — POTASSIUM CHLORIDE IN NACL 20-0.9 MEQ/L-% IV SOLN
INTRAVENOUS | Status: DC
  Administered 2013-10-02 – 2013-10-05 (×5): via INTRAVENOUS
  Filled 2013-10-01 (×8): qty 1000

## 2013-10-01 MED ORDER — SODIUM CHLORIDE 0.9 % IJ SOLN
3.0000 mL | Freq: Two times a day (BID) | INTRAMUSCULAR | Status: DC
  Administered 2013-10-02 – 2013-10-11 (×18): 3 mL via INTRAVENOUS

## 2013-10-01 MED ORDER — HEPARIN SODIUM (PORCINE) 5000 UNIT/ML IJ SOLN: 5000.0000 [IU] | Freq: Three times a day (TID) | INTRAMUSCULAR | Status: DC

## 2013-10-01 MED ORDER — DEXTROSE 5 % IV SOLN
1.0000 g | Freq: Once | INTRAVENOUS | Status: AC
  Administered 2013-10-01: 1 g via INTRAVENOUS
  Filled 2013-10-01: qty 10

## 2013-10-01 NOTE — ED Notes (Signed)
Pt presents to the department with chest pain, pt reports his pain is more of a discomfort and has subsided from when he was at home. Pt reported to his wife that he was having chest pain on the left lower side of his chest that radiated to his left arm. Pt reports he is slightly SOB.

## 2013-10-01 NOTE — Progress Notes (Signed)
ANTICOAGULATION CONSULT NOTE - Initial Consult  Pharmacy Consult for Warfarin  Indication: hx DVT  Allergies  Allergen Reactions  . Vectibix [Panitumumab] Other (See Comments)    On 08/20/12 during Vectibix infusion at [facility] developed difficulty swallowing, unable answer questions due to lips/throat feeling "funny", confusion, with no affect. Has difficulty getting words out.  . Celecoxib Rash    CELEBREX    Patient Measurements: Height: 5\' 10"  (177.8 cm) Weight: 180 lb (81.647 kg) IBW/kg (Calculated) : 73  Vital Signs: Temp: 98.2 F (36.8 C) (09/17 1844) Temp src: Oral (09/17 1844) BP: 177/89 mmHg (09/17 2245) Pulse Rate: 63 (09/17 2245)  Labs:  Recent Labs  10/01/13 1845  HGB 13.4  HCT 39.9  PLT 249  APTT 73*  LABPROT 40.2*  INR 4.16*  CREATININE 5.37*   Medical History: Past Medical History  Diagnosis Date  . DVT (deep venous thrombosis)     hx of s/p Greenfield filter  . OAB (overactive bladder)   . Arthritis   . Cataracts, bilateral   . FH: factor V Leiden deficiency   . History of nephrolithiasis   . History of ankle fracture   . 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  . Peripheral neuropathy   . Vitamin B12 deficiency   . Symptomatic bradycardia   . Sinoatrial node dysfunction   . Sinoatrial node dysfunction   . Pacemaker 4 yrs ago  . Colon cancer 07/23/00    hx of metastic- treated with surgery and readiation  . Allergy     celebrex  . History of radiation therapy 09/02/00-10/07/00    recurrent colon cancer/left periaortic nodal recurrence=4500cGy/25 fxs,Dr.Wu  . Hx SBO     multiple intermittent and surgery  . History of chemotherapy     Assessment: Pt on warfarin for hx DVT. INR on admit is SUPRA-therapeutic at 4.16, other labs as above.   Goal of Therapy:  INR 2-3 Monitor platelets by anticoagulation protocol: Yes   Plan:  -HOLD warfarin for now -Daily PT/INR -Re-start warfarin when indicated   Narda Bonds 10/01/2013,11:32 PM

## 2013-10-01 NOTE — ED Provider Notes (Signed)
CSN: [id number]     Arrival date & time 10/01/13  1838 History   First MD Initiated Contact with Patient 10/01/13 1950     Chief Complaint  Patient presents with  . Chest Pain    Patient is a 80 y.o. male presenting with chest pain. The history is provided by the patient and the spouse.  Chest Pain Pain location:  Substernal area Pain quality: aching and dull   Pain radiates to:  L arm Pain radiates to the back: no   Pain severity:  Mild Onset quality:  Gradual Duration:  4 hours Progression:  Improving Chronicity:  New Context: at rest   Context: not breathing, not lifting, no movement and not raising an arm   Relieved by:  Nothing Worsened by:  Nothing tried Associated symptoms: fatigue (chronic) and shortness of breath   Associated symptoms: no abdominal pain, no back pain, no cough, no fever, no heartburn, no lower extremity edema, no nausea, no near-syncope, no orthopnea, no PND, no syncope and no weakness   Risk factors: prior DVT/PE (PE)   Risk factors: no smoking    Memory loss per spouse. Hx limited. Compliant with meds and coumadin. Has been more fatigued for past 3 days.  No sputum production. No hemoptysis.    Past Medical History  Diagnosis Date  . DVT (deep venous thrombosis)     hx of s/p Greenfield filter  . OAB (overactive bladder)   . Arthritis   . Cataracts, bilateral   . FH: factor V Leiden deficiency   . History of nephrolithiasis   . History of ankle fracture   . 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  . Peripheral neuropathy   . Vitamin B12 deficiency   . Symptomatic bradycardia   . Sinoatrial node dysfunction   . Sinoatrial node dysfunction   . Pacemaker 4 yrs ago  . Colon cancer 07/23/00    hx of metastic- treated with surgery and readiation  . Allergy     celebrex  . History of radiation therapy 09/02/00-10/07/00    recurrent colon cancer/left periaortic nodal recurrence=4500cGy/25 fxs,Dr.Wu  . Hx SBO     multiple intermittent  and surgery  . History of chemotherapy    Past Surgical History  Procedure Laterality Date  . Colectomy      sigmoid secondary to colon ca- 1997  . Exploratory laparotomy      metastatic retroperitoneal lymph node- 2002  . Total knee arthroplasty      right-1991; left-1992  . Back surgeries      x3  . Filter replacement    . Left ankle surgery    . Incisional herniorraphy    . Pacemaker insertion  05/19/2008  . Replacement total knee bilateral    . Colon resection    . Colonoscopy  08/18/2010    Procedure: COLONOSCOPY;  Surgeon: Rogene Houston, MD;  Location: [facility];  Service: Endoscopy;  Laterality: N/A;  . Esophagogastroduodenoscopy  08/18/2010    Procedure: ESOPHAGOGASTRODUODENOSCOPY (EGD);  Surgeon: Rogene Houston, MD;  Location: [facility];  Service: Endoscopy;  Laterality: N/A;  . Partial colectomy  11/28/2011    Procedure: PARTIAL COLECTOMY;  Surgeon: Jamesetta So, MD;  Location: [facility];  Service: General;  Laterality: N/A;  . Laparoscopic ileocecectomy  11/28/11  . Appendectomy     Family History  Problem Relation Age of Onset  . Anesthesia problems Neg Hx   . Hypotension Neg Hx   .  Malignant hyperthermia Neg Hx   . Pseudochol deficiency Neg Hx   . Cancer Sister    History  Substance Use Topics  . Smoking status: Never Smoker   . Smokeless tobacco: Never Used  . Alcohol Use: No    Review of Systems  Constitutional: Positive for fatigue (chronic). Negative for fever.  Respiratory: Positive for shortness of breath. Negative for cough.   Cardiovascular: Positive for chest pain. Negative for orthopnea, syncope, PND and near-syncope.  Gastrointestinal: Negative for heartburn, nausea and abdominal pain.  Musculoskeletal: Negative for back pain.  Neurological: Negative for weakness.  All other systems reviewed and are negative.   Allergies  Vectibix and Celecoxib  Home Medications   Prior to Admission medications   Medication Sig Start Date End Date  Taking? Authorizing Provider  cholecalciferol (VITAMIN D) 1000 UNITS tablet Take 1,000 Units by mouth at bedtime.     Historical Provider, MD  diphenoxylate-atropine (LOMOTIL) 2.5-0.025 MG per tablet Take 1 tablet by mouth 4 (four) times daily as needed for diarrhea or loose stools.     Historical Provider, MD  oxyCODONE-acetaminophen (PERCOCET/ROXICET) 5-325 MG per tablet Take 1-2 tablets by mouth every 4 (four) hours as needed for moderate pain or severe pain. 03/13/13   Ladell Pier, MD  promethazine (PHENERGAN) 12.5 MG tablet Take 12.5 mg by mouth every 6 (six) hours as needed for nausea.     Historical Provider, MD  warfarin (COUMADIN) 2 MG tablet Take 1 tablet (2 mg total) by mouth as directed. Taken with 5mg  tablet on Mon, [place] & Fridays. 08/06/13   Ladell Pier, MD  warfarin (COUMADIN) 5 MG tablet Take 5 mg by mouth daily except 7 mg on Tues/Thurs or as directed. 08/10/13   Curt Bears, MD   BP 168/79  Pulse 69  Temp(Src) 98.2 F (36.8 C) (Oral)  Resp 23  Ht 5\' 10"  (1.778 m)  Wt 180 lb (81.647 kg)  BMI 25.83 kg/m2  SpO2 97% Physical Exam  Nursing note and vitals reviewed. Constitutional: He is oriented to person, place, and time. He appears well-developed and well-nourished. No distress.  HENT:  Head: Normocephalic and atraumatic.  Nose: Nose normal.  Eyes: Conjunctivae are normal.  Neck: Normal range of motion. Neck supple. No tracheal deviation present.  Cardiovascular: Normal rate, regular rhythm and normal heart sounds.   No murmur heard. Pulmonary/Chest: Effort normal and breath sounds normal. No respiratory distress. He has no rales.  Abdominal: Soft. Bowel sounds are normal. He exhibits no distension and no mass. There is no tenderness.  Musculoskeletal: Normal range of motion. He exhibits no edema.  No lower extremity edema, calf tenderness, warmth, erythema or palpable cords  Neurological: He is alert and oriented to person, place, and time.  Skin: Skin is warm  and dry. No rash noted.  Psychiatric: He has a normal mood and affect.    ED Course  Procedures (including critical care time) Labs Review Labs Reviewed  COMPREHENSIVE METABOLIC PANEL - Abnormal; Notable for the following:    Potassium 3.4 (*)    Glucose, Bld 150 (*)    BUN 43 (*)    Creatinine, Ser 5.37 (*)    Albumin 3.1 (*)    GFR calc non Af Amer 9 (*)    GFR calc Af Amer 10 (*)    Anion gap 17 (*)    All other components within normal limits  APTT - Abnormal; Notable for the following:    aPTT 73 (*)  All other components within normal limits  PRO B NATRIURETIC PEPTIDE - Abnormal; Notable for the following:    Pro B Natriuretic peptide (BNP) 17357.0 (*)    All other components within normal limits  PROTIME-INR - Abnormal; Notable for the following:    Prothrombin Time 40.2 (*)    INR 4.16 (*)    All other components within normal limits  CULTURE, BLOOD (ROUTINE X 2)  CULTURE, BLOOD (ROUTINE X 2)  CULTURE, EXPECTORATED SPUTUM-ASSESSMENT  GRAM STAIN  URINE CULTURE  CBC  CBC  CREATININE, SERUM  COMPREHENSIVE METABOLIC PANEL  CBC WITH DIFFERENTIAL  TSH  TROPONIN I  TROPONIN I  TROPONIN I  TSH  HIV ANTIBODY (ROUTINE TESTING)  LEGIONELLA ANTIGEN, URINE  STREP PNEUMONIAE URINARY ANTIGEN  URINALYSIS, ROUTINE W REFLEX MICROSCOPIC  SODIUM, URINE, RANDOM  CREATININE, URINE, RANDOM  PROTIME-INR  I-STAT TROPOININ, ED  I-STAT CG4 LACTIC ACID, ED  Randolm Idol, ED    Imaging Review Dg Chest 2 View  10/01/2013   CLINICAL DATA:  Chest pain and shortness of breath  EXAM: CHEST  2 VIEW  COMPARISON:  08/20/2012 and prior radiographs  FINDINGS: Cardiomegaly and left-sided pacemaker again noted.  Left lower lobe airspace disease/ consolidation likely represents pneumonia.  There is no evidence of pleural effusion, pneumothorax or pulmonary edema.  No acute bony abnormalities are noted.  IMPRESSION: Left lower lobe airspace disease/consolidation likely representing  pneumonia. Radiographic follow-up to resolution is recommended.  Cardiomegaly.   Electronically Signed   By: Hassan Rowan M.D.   On: 10/01/2013 20:55     EKG Interpretation   Date/Time:  Thursday October 01 2013 18:42:21 EDT Ventricular Rate:  78 PR Interval:  256 QRS Duration: 105 QT Interval:  402 QTC Calculation: 458 R Axis:   80 Text Interpretation:  Age not entered, assumed to be  80 years old for  purpose of ECG interpretation Sinus rhythm Multiform ventricular premature  complexes Prolonged PR interval LVH with secondary repolarization  abnormality When compared with ECG of 08/20/2012, Premature ventricular  complexes are now Present Confirmed by [facility]  MD, DAVID ([id number]) on  10/01/2013 6:52:45 PM      MDM   Final diagnoses:  Pneumonia, organism unspecified  AKI (acute kidney injury)  Elevated brain natriuretic peptide (BNP) level    Pt presents with atypical chest pain for past day with preceding fatigue.  VSS. Comfortable appearing, doubt dissection.  EKG unchanged compared to prior.  Doubt PE as pt is anticoagulated, INR 4.  Trop mildly elevated, likely from renal dysfunction, will need to trend.  CXR demonstrates PNA.  CAP. No recent hospitalizations.  ABX with Rocephin and azithromycin.  Labs significant for AKI likely contributing to elevated BNP and trop.  No signs of volume overload or pulm edema.    Hospitalist to admit.  Vitals remained stable throughout ED course.   Tammy Sours, MD 10/01/13 2351

## 2013-10-01 NOTE — H&P (Signed)
Hospitalist Admission History and Physical  Patient name: Justin Lang. Medical record number: [id number] Date of birth: [birth year] Age: 80 y.o. Gender: male  Primary Care Provider: Delphina Cahill, MD  Chief Complaint: chest pain, cardiomyopathy, AKI, CAP  History of Present Illness:This is a 80 y.o. year old male with significant past medical history of stage 4 prostate cancer, SA node dysfunction s/p pacemaker, hx/o DVT s/p IVC filter on coumadin presenting with with chest pain, , CAP, AKI, cardiomyopathy. Per wife pt has had progressive malaise and weakness. Wife reports that pt has not had chemotherapy or radiation for about 10 months as pt cannot tolerate this. Over the past 1-2 days, pt complaining of chest pain and worsening weakness. Also with mild cough and low back/kidney pain. Wife, pt deny and fevers or chills. No episodes of vomiting. No dysuria. No decreased urination.  Presented to ER afebrile, hemodynamically stable. Satting >96% on RA. Notable labs include WBC 8.4, hgb 13.4, K 3.4, Cr 5.37 (baseline 1.7), BUN 43, bicarb 23, INR 4.16, Pro BNP 17k, trop 0.04-->0.05, lactate 0.78. CXR consistent with LLL PNA.    Assessment and Plan: Jonte Shiller. is a 80 y.o. year old male presenting with chest pain, CAP, AKI, caridomyopathy  Active Problems:   Chest pain   CAP (community acquired pneumonia)   AKI (acute kidney injury)   Cardiomyopathy  1-Chest Pain/cardiomyopathy   -Cycle CEs  -risk stratification labs  -dry to euvolemic on exam  -2D ECHO -CT chest to assess for any infiltrative malignancy/intracardiac pathology -cards consult in am   2-CAP  -Rocephin and azithro  -Panculture  -urine strep and legionella   3-AKI -ddx inlcudes prerenatl etiology vs. Obstruction in setting of stage 4 lung ca  -f/u CT chest, abd, pelvis  -bladder scan if any distension on imaging -check FeNa   4-stage 4 colon ca  -progressive disease  -? If this is overlying contibutor  to overall contributor -f/u imaging  -H-O consult prn   5-hx/o DVT -coumadin per pharmacy    FEN/GI: NPO for now  Prophylaxis: coumadin  Disposition: pending further evaluation  Code Status:DNR    Patient Active Problem List   Diagnosis Date Noted  . Chest pain 10/01/2013  . DVT (deep venous thrombosis), unspecified laterality 08/01/2012  . Paresthesia of foot 10/16/2011  . Colitis presumed infectious 10/14/2011  . Sinoatrial node dysfunction   . Anticoagulant long-term use 02/17/2011  . Pacemaker 07/05/2010  . ABDOMINAL PAIN, UNSPECIFIED SITE 11/17/2007  . HYPOVOLEMIA 07/30/2007  . DIARRHEA 07/30/2007  . VERTIGO SECONDARY TO LABRYNTHITIS 02/28/2007  . VOMITING 02/28/2007  . Memory loss 12/31/2006  . HEADACHE 12/31/2006  . GASTROENTERITIS, ACUTE 12/23/2006  . NAUSEA AND VOMITING 12/23/2006  . VITAMIN B12 DEFICIENCY 05/02/2006  . NEUROPATHY-PERIPHERAL 04/04/2006  . Pain in Soft Tissues of Limb 04/04/2006  . ELEVATED BLOOD PRESSURE 04/04/2006  . CATARACT NOS 12/12/2005  . OVERACTIVE BLADDER 12/12/2005  . ARTHRITIS 12/12/2005  . COLON CANCER, HX OF 12/12/2005  . DVT, HX OF 12/12/2005  . NEPHROLITHIASIS, HX OF 12/12/2005  . GREENFIELD FILTER INSERTION, HX OF 12/12/2005  . Colon cancer 07/23/2000   Past Medical History: Past Medical History  Diagnosis Date  . DVT (deep venous thrombosis)     hx of s/p Greenfield filter  . OAB (overactive bladder)   . Arthritis   . Cataracts, bilateral   . FH: factor V Leiden deficiency   . History of nephrolithiasis   . History of ankle fracture   .  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  . Peripheral neuropathy   . Vitamin B12 deficiency   . Symptomatic bradycardia   . Sinoatrial node dysfunction   . Sinoatrial node dysfunction   . Pacemaker 4 yrs ago  . Colon cancer 07/23/00    hx of metastic- treated with surgery and readiation  . Allergy     celebrex  . History of radiation therapy 09/02/00-10/07/00     recurrent colon cancer/left periaortic nodal recurrence=4500cGy/25 fxs,Dr.Wu  . Hx SBO     multiple intermittent and surgery  . History of chemotherapy     Past Surgical History: Past Surgical History  Procedure Laterality Date  . Colectomy      sigmoid secondary to colon ca- 1997  . Exploratory laparotomy      metastatic retroperitoneal lymph node- 2002  . Total knee arthroplasty      right-1991; left-1992  . Back surgeries      x3  . Filter replacement    . Left ankle surgery    . Incisional herniorraphy    . Pacemaker insertion  05/19/2008  . Replacement total knee bilateral    . Colon resection    . Colonoscopy  08/18/2010    Procedure: COLONOSCOPY;  Surgeon: Rogene Houston, MD;  Location: [facility];  Service: Endoscopy;  Laterality: N/A;  . Esophagogastroduodenoscopy  08/18/2010    Procedure: ESOPHAGOGASTRODUODENOSCOPY (EGD);  Surgeon: Rogene Houston, MD;  Location: [facility];  Service: Endoscopy;  Laterality: N/A;  . Partial colectomy  11/28/2011    Procedure: PARTIAL COLECTOMY;  Surgeon: Jamesetta So, MD;  Location: [facility];  Service: General;  Laterality: N/A;  . Laparoscopic ileocecectomy  11/28/11  . Appendectomy      Social History: History   Social History  . Marital Status: Married    Spouse Name: N/A    Number of Children: 6  . Years of Education: N/A   Social History Main Topics  . Smoking status: Never Smoker   . Smokeless tobacco: Never Used  . Alcohol Use: No  . Drug Use: No  . Sexual Activity: No   Other Topics Concern  . None   Social History Narrative   Married, lives with spouse.     Family History: Family History  Problem Relation Age of Onset  . Anesthesia problems Neg Hx   . Hypotension Neg Hx   . Malignant hyperthermia Neg Hx   . Pseudochol deficiency Neg Hx   . Cancer Sister     Allergies: Allergies  Allergen Reactions  . Vectibix [Panitumumab] Other (See Comments)    On 08/20/12 during Vectibix infusion at [facility]  developed difficulty swallowing, unable answer questions due to lips/throat feeling "funny", confusion, with no affect. Has difficulty getting words out.  . Celecoxib Rash    CELEBREX    Current Facility-Administered Medications  Medication Dose Route Frequency Provider Last Rate Last Dose  . 0.9 % NaCl with KCl 20 mEq/ L  infusion   Intravenous Continuous Shanda Howells, MD      . azithromycin (ZITHROMAX) 500 mg in dextrose 5 % 250 mL IVPB  500 mg Intravenous Once Tammy Sours, MD 250 mL/hr at 10/01/13 2244 500 mg at 10/01/13 2244  . heparin injection 5,000 Units  5,000 Units Subcutaneous 3 times per day Shanda Howells, MD      . sodium chloride 0.9 % injection 3 mL  3 mL Intravenous Q12H Shanda Howells, MD  Current Outpatient Prescriptions  Medication Sig Dispense Refill  . cholecalciferol (VITAMIN D) 1000 UNITS tablet Take 2,000 Units by mouth at bedtime.       . diphenoxylate-atropine (LOMOTIL) 2.5-0.025 MG per tablet Take 1 tablet by mouth 4 (four) times daily as needed for diarrhea or loose stools.       . mupirocin ointment (BACTROBAN) 2 % Apply 1 application topically 2 (two) times daily.       Marland Kitchen warfarin (COUMADIN) 2 MG tablet Take 1 tablet (2 mg total) by mouth as directed. Taken with 5mg  tablet on Mon, [place] & Fridays.  30 tablet  1  . warfarin (COUMADIN) 5 MG tablet Take 5 mg by mouth daily except 7 mg on Tues/Thurs or as directed.  30 tablet  3   Review Of Systems: 12 point ROS negative except as noted above in HPI.  Physical Exam: Filed Vitals:   10/01/13 2245  BP: 177/89  Pulse: 63  Temp:   Resp: 18    General: cooperative and cachectic HEENT: PERRLA, extra ocular movement intact and dry oral mucosa  Heart: S1, S2 normal, no murmur, rub or gallop, regular rate and rhythm Lungs: clear to auscultation, no wheezes or rales and unlabored breathing Abdomen: abdomen is soft without significant tenderness, masses, organomegaly or guarding Extremities: extremities normal,  atraumatic, no cyanosis or edema Skin:no rashes Neurology: normal without focal findings  Labs and Imaging: Lab Results  Component Value Date/Time   NA 143 10/01/2013  6:45 PM   NA 139 12/30/2012 11:41 AM   K 3.4* 10/01/2013  6:45 PM   K 4.3 12/30/2012 11:41 AM   CL 103 10/01/2013  6:45 PM   CO2 23 10/01/2013  6:45 PM   CO2 26 12/30/2012 11:41 AM   BUN 43* 10/01/2013  6:45 PM   BUN 17.4 12/30/2012 11:41 AM   CREATININE 5.37* 10/01/2013  6:45 PM   CREATININE 1.7* 12/30/2012 11:41 AM   GLUCOSE 150* 10/01/2013  6:45 PM   GLUCOSE 86 12/30/2012 11:41 AM   Lab Results  Component Value Date   WBC 8.4 10/01/2013   HGB 13.4 10/01/2013   HCT 39.9 10/01/2013   MCV 88.1 10/01/2013   PLT 249 10/01/2013    Dg Chest 2 View  10/01/2013   CLINICAL DATA:  Chest pain and shortness of breath  EXAM: CHEST  2 VIEW  COMPARISON:  08/20/2012 and prior radiographs  FINDINGS: Cardiomegaly and left-sided pacemaker again noted.  Left lower lobe airspace disease/ consolidation likely represents pneumonia.  There is no evidence of pleural effusion, pneumothorax or pulmonary edema.  No acute bony abnormalities are noted.  IMPRESSION: Left lower lobe airspace disease/consolidation likely representing pneumonia. Radiographic follow-up to resolution is recommended.  Cardiomegaly.   Electronically Signed   By: Hassan Rowan M.D.   On: 10/01/2013 20:55           Shanda Howells MD  Pager: [phone]

## 2013-10-01 NOTE — ED Notes (Signed)
Patient transported to X-ray 

## 2013-10-02 ENCOUNTER — Encounter (HOSPITAL_COMMUNITY): Payer: Self-pay | Admitting: *Deleted

## 2013-10-02 ENCOUNTER — Inpatient Hospital Stay (HOSPITAL_COMMUNITY): Payer: Medicare Other

## 2013-10-02 DIAGNOSIS — I509 Heart failure, unspecified: Secondary | ICD-10-CM

## 2013-10-02 DIAGNOSIS — I059 Rheumatic mitral valve disease, unspecified: Secondary | ICD-10-CM

## 2013-10-02 DIAGNOSIS — N179 Acute kidney failure, unspecified: Principal | ICD-10-CM

## 2013-10-02 DIAGNOSIS — I82409 Acute embolism and thrombosis of unspecified deep veins of unspecified lower extremity: Secondary | ICD-10-CM

## 2013-10-02 DIAGNOSIS — C189 Malignant neoplasm of colon, unspecified: Secondary | ICD-10-CM

## 2013-10-02 DIAGNOSIS — I5042 Chronic combined systolic (congestive) and diastolic (congestive) heart failure: Secondary | ICD-10-CM

## 2013-10-02 DIAGNOSIS — R799 Abnormal finding of blood chemistry, unspecified: Secondary | ICD-10-CM

## 2013-10-02 LAB — CBC WITH DIFFERENTIAL/PLATELET
BASOS PCT: 1 % (ref 0–1)
Basophils Absolute: 0 10*3/uL (ref 0.0–0.1)
EOS PCT: 8 % — AB (ref 0–5)
Eosinophils Absolute: 0.5 10*3/uL (ref 0.0–0.7)
HEMATOCRIT: 36.1 % — AB (ref 39.0–52.0)
HEMOGLOBIN: 12.3 g/dL — AB (ref 13.0–17.0)
Lymphocytes Relative: 19 % (ref 12–46)
Lymphs Abs: 1.3 10*3/uL (ref 0.7–4.0)
MCH: 29.6 pg (ref 26.0–34.0)
MCHC: 34.1 g/dL (ref 30.0–36.0)
MCV: 87 fL (ref 78.0–100.0)
MONO ABS: 0.8 10*3/uL (ref 0.1–1.0)
Monocytes Relative: 11 % (ref 3–12)
NEUTROS ABS: 4.4 10*3/uL (ref 1.7–7.7)
Neutrophils Relative %: 61 % (ref 43–77)
Platelets: 225 10*3/uL (ref 150–400)
RBC: 4.15 MIL/uL — ABNORMAL LOW (ref 4.22–5.81)
RDW: 14.8 % (ref 11.5–15.5)
WBC: 7.1 10*3/uL (ref 4.0–10.5)

## 2013-10-02 LAB — URINALYSIS, ROUTINE W REFLEX MICROSCOPIC
Bilirubin Urine: NEGATIVE
Glucose, UA: NEGATIVE mg/dL
KETONES UR: NEGATIVE mg/dL
Leukocytes, UA: NEGATIVE
Nitrite: NEGATIVE
PROTEIN: NEGATIVE mg/dL
Specific Gravity, Urine: 1.009 (ref 1.005–1.030)
Urobilinogen, UA: 0.2 mg/dL (ref 0.0–1.0)
pH: 5.5 (ref 5.0–8.0)

## 2013-10-02 LAB — COMPREHENSIVE METABOLIC PANEL
ALT: 5 U/L (ref 0–53)
AST: 22 U/L (ref 0–37)
Albumin: 2.7 g/dL — ABNORMAL LOW (ref 3.5–5.2)
Alkaline Phosphatase: 61 U/L (ref 39–117)
Anion gap: 19 — ABNORMAL HIGH (ref 5–15)
BUN: 45 mg/dL — ABNORMAL HIGH (ref 6–23)
CHLORIDE: 102 meq/L (ref 96–112)
CO2: 21 mEq/L (ref 19–32)
Calcium: 8.1 mg/dL — ABNORMAL LOW (ref 8.4–10.5)
Creatinine, Ser: 5.32 mg/dL — ABNORMAL HIGH (ref 0.50–1.35)
GFR calc Af Amer: 10 mL/min — ABNORMAL LOW (ref >90)
GFR calc non Af Amer: 9 mL/min — ABNORMAL LOW (ref >90)
Glucose, Bld: 101 mg/dL — ABNORMAL HIGH (ref 70–99)
Potassium: 3.1 mEq/L — ABNORMAL LOW (ref 3.7–5.3)
SODIUM: 142 meq/L (ref 137–147)
TOTAL PROTEIN: 6.3 g/dL (ref 6.0–8.3)
Total Bilirubin: 0.4 mg/dL (ref 0.3–1.2)

## 2013-10-02 LAB — HIV ANTIBODY (ROUTINE TESTING W REFLEX): HIV 1&2 Ab, 4th Generation: NONREACTIVE

## 2013-10-02 LAB — URINE MICROSCOPIC-ADD ON

## 2013-10-02 LAB — TSH
TSH: 3.24 u[IU]/mL (ref 0.350–4.500)
TSH: 3.19 u[IU]/mL (ref 0.350–4.500)

## 2013-10-02 LAB — CREATININE, SERUM
CREATININE: 5.14 mg/dL — AB (ref 0.50–1.35)
GFR calc Af Amer: 10 mL/min — ABNORMAL LOW (ref >90)
GFR calc non Af Amer: 9 mL/min — ABNORMAL LOW (ref >90)

## 2013-10-02 LAB — TROPONIN I
Troponin I: <0.3 ng/mL (ref <0.30)
Troponin I: <0.3 ng/mL (ref <0.30)

## 2013-10-02 LAB — SODIUM, URINE, RANDOM: SODIUM UR: 55 meq/L

## 2013-10-02 LAB — PROTIME-INR
INR: 4.68 — ABNORMAL HIGH (ref 0.00–1.49)
Prothrombin Time: 44.1 seconds — ABNORMAL HIGH (ref 11.6–15.2)

## 2013-10-02 LAB — CREATININE, URINE, RANDOM: Creatinine, Urine: 60.88 mg/dL

## 2013-10-02 LAB — STREP PNEUMONIAE URINARY ANTIGEN: Strep Pneumo Urinary Antigen: NEGATIVE

## 2013-10-02 MED ORDER — HYDRALAZINE HCL 20 MG/ML IJ SOLN
10.0000 mg | Freq: Four times a day (QID) | INTRAMUSCULAR | Status: DC | PRN
  Administered 2013-10-02 – 2013-10-08 (×2): 10 mg via INTRAVENOUS
  Filled 2013-10-02 (×2): qty 1

## 2013-10-02 MED ORDER — LORAZEPAM 2 MG/ML IJ SOLN
0.5000 mg | Freq: Once | INTRAMUSCULAR | Status: AC
  Administered 2013-10-02: 0.5 mg via INTRAVENOUS
  Filled 2013-10-02: qty 1

## 2013-10-02 NOTE — ED Provider Notes (Signed)
80 year old male had apparently been not feeling well at home for the last several days and started complaining of some chest pain today. There was no fever but there has been a slight cough. This history is obtained predominantly from his wife. On exam, her lungs are clear and heart has a regular rate and rhythm. Chest x-ray is consistent with pneumonia. Laboratory workup is significant for marked worsening of renal function. Last creatinine was 1.7 in December and is now 5.4. He is started on antibiotics for community acquired pneumonia and he is admitted for treatment of pneumonia and also further evaluation of her acute kidney injury.  I saw and evaluated the patient, reviewed the resident's note and I agree with the findings and plan.   EKG Interpretation   Date/Time:  Thursday October 01 2013 18:42:21 EDT Ventricular Rate:  78 PR Interval:  256 QRS Duration: 105 QT Interval:  402 QTC Calculation: 458 R Axis:   80 Text Interpretation:  Age not entered, assumed to be  80 years old for  purpose of ECG interpretation Sinus rhythm Multiform ventricular premature  complexes Prolonged PR interval LVH with secondary repolarization  abnormality When compared with ECG of 08/20/2012, Premature ventricular  complexes are now Present Confirmed by [facility]  MD, Aarica Wax ([id number]) on  10/01/2013 6:52:45 PM        Delora Fuel, MD [birth year]48 7530

## 2013-10-02 NOTE — Progress Notes (Signed)
INITIAL NUTRITION ASSESSMENT  DOCUMENTATION CODES Per approved criteria  -Not Applicable   INTERVENTION: -Encourage adequate PO intake of meals.  -RD will add nutrition supplements as needed if PO intake inadequate  NUTRITION DIAGNOSIS: Inadequate oral intake related to inability to eat as evidenced by previous NPO status  Goal: Patient will meet >/=90% of estimated nutrition needs  Monitor:  PO intake, weight trends, labs, I/Os  Reason for Assessment: Malnutrition screening tool  80 y.o. male  Admitting Dx: Cardiomyopathy  ASSESSMENT: 80 year old male with history of recurrent stage 4 prostate and colon cancer, SA node dysfunction s/p pacemaker, presents with chest pain, CAP, AKI, cardiomyopathy.   Patient reports that he has never been a big eater, but denies changes in appetite. He is not sure if he has lost weight recently, however chart review shows a 5% weight loss in 7 months, which is not significant over the time frame. Diet just advanced today to Heart Healthy.   Patient does have a history of chemotherapy and radiation, but not for the last 10 months due to intolerance.   Height: Ht Readings from Last 1 Encounters:  10/02/13 5\' 8"  (1.727 m)    Weight: Wt Readings from Last 1 Encounters:  10/02/13 166 lb 14.2 oz (75.7 kg)    Ideal Body Weight: 154 pounds  % Ideal Body Weight: 108%  Wt Readings from Last 10 Encounters:  10/02/13 166 lb 14.2 oz (75.7 kg)  07/20/13 168 lb 1.9 oz (76.259 kg)  07/10/13 169 lb 3.2 oz (76.749 kg)  04/10/13 172 lb (78.019 kg)  03/13/13 175 lb 6.4 oz (79.561 kg)  12/30/12 173 lb 1.6 oz (78.518 kg)  10/28/12 168 lb 6.4 oz (76.386 kg)  09/17/12 161 lb 9.6 oz (73.301 kg)  08/25/12 164 lb 12.8 oz (74.753 kg)  08/15/12 161 lb 4.8 oz (73.165 kg)    Usual Body Weight: 175 pounds  % Usual Body Weight: 95%  BMI:  Body mass index is 25.38 kg/(m^2). Patient is overweight.   Estimated Nutritional Needs: Kcal: 1700-1800  kcal Protein: 75-90 g Fluid: 2.1-2.3 L/day  Skin: Intact  Diet Order: Cardiac  EDUCATION NEEDS: -No education needs identified at this time   Intake/Output Summary (Last 24 hours) at 10/02/13 1141 Last data filed at 10/02/13 1037  Gross per 24 hour  Intake    250 ml  Output    125 ml  Net    125 ml    Last BM: 9/18   Labs:   Recent Labs Lab 10/01/13 1845 10/01/13 2315 10/02/13 0455  NA 143  --  142  K 3.4*  --  3.1*  CL 103  --  102  CO2 23  --  21  BUN 43*  --  45*  CREATININE 5.37* 5.14* 5.32*  CALCIUM 8.5  --  8.1*  GLUCOSE 150*  --  101*    CBG (last 3)  No results found for this basename: GLUCAP,  in the last 72 hours  Scheduled Meds: . azithromycin  500 mg Intravenous Q24H  . cefTRIAXone (ROCEPHIN)  IV  1 g Intravenous Q24H  . sodium chloride  3 mL Intravenous Q12H    Continuous Infusions: . 0.9 % NaCl with KCl 20 mEq / L 50 mL/hr at 10/02/13 4627    Past Medical History  Diagnosis Date  . DVT (deep venous thrombosis)     hx of s/p Greenfield filter  . OAB (overactive bladder)   . Arthritis   . Cataracts, bilateral   .  FH: factor V Leiden deficiency   . History of nephrolithiasis   . History of ankle fracture   . 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  . Peripheral neuropathy   . Vitamin B12 deficiency   . Symptomatic bradycardia   . Sinoatrial node dysfunction   . Sinoatrial node dysfunction   . Pacemaker 4 yrs ago  . Colon cancer 07/23/00    hx of metastic- treated with surgery and readiation  . Allergy     celebrex  . History of radiation therapy 09/02/00-10/07/00    recurrent colon cancer/left periaortic nodal recurrence=4500cGy/25 fxs,Dr.Wu  . Hx SBO     multiple intermittent and surgery  . History of chemotherapy     Past Surgical History  Procedure Laterality Date  . Colectomy      sigmoid secondary to colon ca- 1997  . Exploratory laparotomy      metastatic retroperitoneal lymph node- 2002  . Total knee  arthroplasty      right-1991; left-1992  . Back surgeries      x3  . Filter replacement    . Left ankle surgery    . Incisional herniorraphy    . Pacemaker insertion  05/19/2008  . Replacement total knee bilateral    . Colon resection    . Colonoscopy  08/18/2010    Procedure: COLONOSCOPY;  Surgeon: Rogene Houston, MD;  Location: [facility];  Service: Endoscopy;  Laterality: N/A;  . Esophagogastroduodenoscopy  08/18/2010    Procedure: ESOPHAGOGASTRODUODENOSCOPY (EGD);  Surgeon: Rogene Houston, MD;  Location: [facility];  Service: Endoscopy;  Laterality: N/A;  . Partial colectomy  11/28/2011    Procedure: PARTIAL COLECTOMY;  Surgeon: Jamesetta So, MD;  Location: [facility];  Service: General;  Laterality: N/A;  . Laparoscopic ileocecectomy  11/28/11  . Appendectomy      Larey Seat, RD, LDN Pager #: [phone] After-Hours Pager #: [phone]

## 2013-10-02 NOTE — Progress Notes (Addendum)
[facility] PROGRESS NOTE  Justin Lang. VHQ:[id number] DOB: 1922-02-20 DOA: 10/01/2013 PCP: Delphina Cahill, MD  Assessment/Plan: 1. Chest pain 1. Serial trop neg 2. 2d echo with no WMA but with 41-32% EF with diastolic dysfunction (see below) 2. 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 Pt appears to be compensated at this time 2. BNP is markedly elevated, but question if renal failure is contributing 3. Given markedly low EF of 20-25%, would avoid aggressive volume load - including blood products if we can avoid them (see below) 3. CAP 1. Afebrile 2. Tolerating rocephin and azithrymycin 4. AKI 1. Cr worsened slightly overnight 2. CT abd reviewed. Pt noted to have enlarged metastatic lesion that appears to be resulting in R ureteral obstruction and hydroureter 3. Appreciate Urology input, with recs for IR for PCN 4. INR supertherapeutic currently for intervention (see below) 5. Metastatic colon ca 1. Followed by Oncology as outpatient 6. Hx DVT 1. INR supertherapeutic 2. No signs of active bleed 3. Coumadin is on hold 4. Given EF of 20%, would avoid volume load at this time in the form of blood products such as FFP 5. Will repeat INR in AM and if still trending up, may consider more aggressive reversal at that time 7. Coumadin coagulopathy 1.  Per above  Code Status: DNR Family Communication: Pt and wife in room  Disposition Plan: Pending   Consultants:  Urology  Procedures:    Antibiotics:  Rocephin 9/17>>>  Azithromycin 9/17>>>  HPI/Subjective: No complaints. Eager to go home  Objective: Filed Vitals:   10/02/13 0000 10/02/13 0046 10/02/13 0437 10/02/13 1449  BP: 163/91 156/80 152/89 163/90  Pulse: 65 67 67 73  Temp:  97.4 F (36.3 C) 97.5 F (36.4 C) 98.2 F (36.8 C)  TempSrc:  Oral Oral Axillary  Resp: 16 20 20 20   Height:  5\' 8"  (1.727 m)    Weight:  75.7 kg (166 lb 14.2 oz)    SpO2: 98% 96% 97% 96%    Intake/Output  Summary (Last 24 hours) at 10/02/13 1656 Last data filed at 10/02/13 1348  Gross per 24 hour  Intake    370 ml  Output    125 ml  Net    245 ml   Filed Weights   10/01/13 1844 10/02/13 0046  Weight: 81.647 kg (180 lb) 75.7 kg (166 lb 14.2 oz)    Exam:   General:  Awake, in nad  Cardiovascular: regular, s1, s2  Respiratory: normal resp effort, no wheezing  Abdomen: soft,nondistended  Musculoskeletal: perfused, no clubbing   Data Reviewed: Basic Metabolic Panel:  Recent Labs Lab 10/01/13 1845 10/01/13 2315 10/02/13 0455  NA 143  --  142  K 3.4*  --  3.1*  CL 103  --  102  CO2 23  --  21  GLUCOSE 150*  --  101*  BUN 43*  --  45*  CREATININE 5.37* 5.14* 5.32*  CALCIUM 8.5  --  8.1*   Liver Function Tests:  Recent Labs Lab 10/01/13 1845 10/02/13 0455  AST 13 22  ALT 8 5  ALKPHOS 69 61  BILITOT 0.4 0.4  PROT 7.3 6.3  ALBUMIN 3.1* 2.7*   No results found for this basename: LIPASE, AMYLASE,  in the last 168 hours No results found for this basename: AMMONIA,  in the last 168 hours CBC:  Recent Labs Lab 10/01/13 1845 10/01/13 2315 10/02/13 0455  WBC 8.4 8.1 7.1  NEUTROABS  --   --  4.4  HGB 13.4 12.5* 12.3*  HCT 39.9 36.6* 36.1*  MCV 88.1 87.4 87.0  PLT 249 245 225   Cardiac Enzymes:  Recent Labs Lab 10/01/13 2315 10/02/13 0455 10/02/13 0955  TROPONINI <0.30 <0.30 <0.30   BNP (last 3 results)  Recent Labs  10/01/13 1845  PROBNP 17357.0*   CBG: No results found for this basename: GLUCAP,  in the last 168 hours  No results found for this or any previous visit (from the past 240 hour(s)).   Studies: Ct Abdomen Pelvis Wo Contrast  10/02/2013   CLINICAL DATA:  Diffuse abdominal pain and chest pain. Assess for malignancy.  EXAM: CT CHEST, ABDOMEN AND PELVIS WITHOUT CONTRAST  TECHNIQUE: Multidetector CT imaging of the chest, abdomen and pelvis was performed following the standard protocol without IV contrast.  COMPARISON:  CT of the abdomen  and pelvis performed 03/16/2013, and CT of the chest performed 08/08/2012  FINDINGS: CT CHEST FINDINGS  Small to moderate right and small left pleural effusions are seen. Patchy bilateral airspace opacities are noted, with underlying interstitial prominence, concerning for pulmonary edema. No pneumothorax is seen. No masses are identified, though evaluation is limited given airspace opacification.  Diffuse coronary artery calcifications are seen. A mildly enlarged right paratracheal node is seen, measuring 1.4 cm in short axis. No definite hilar lymphadenopathy is appreciated. The great vessels are grossly unremarkable in appearance. No pericardial effusion is identified. A pacemaker lead is ending at the right ventricle; the pacemaker is seen at the anterior left chest wall.  The thyroid gland is unremarkable. No axillary lymphadenopathy is seen.  No acute osseous abnormalities are identified.  CT ABDOMEN AND PELVIS FINDINGS  A few hypodensities are noted within the liver, measuring up to 2.1 cm in size. These are relatively stable from the prior study and likely benign. A small calcified granuloma is noted near the hepatic hilum. The spleen is unremarkable in appearance. The patient is status post cholecystectomy, with clips noted at the gallbladder fossa. The pancreas and adrenal glands are unremarkable.  The previously noted peritoneal metastasis at the right lower quadrant has increased in size, now measuring approximately 2.2 x 1.9 cm. This obscures the right ureter at this level, with resultant dilatation of the right ureter and relatively severe right-sided hydronephrosis.  A nonobstructing 6 mm stone is noted within the dilated right ureter, proximal to the mass. There is severe left-sided renal atrophy, with several stones noted at the left renal pelvis, measuring up to 8 mm in size.  No free fluid is identified. Postoperative change is noted about the proximal small bowel. The visualized small bowel is  otherwise unremarkable. The stomach is within normal limits. No acute vascular abnormalities are seen. The IVC filter is noted in expected position. Scattered calcification is seen along the abdominal aorta and its branches.  The patient is status post appendectomy. Scattered diverticulosis is noted along the descending and sigmoid colon, without evidence of diverticulitis. Contrast progresses to the level of the rectum. Postoperative change is noted at the proximal sigmoid colon.  The bladder is mildly distended. A prominent bladder calculus is again noted. There is nodular extension of the prostate at the base of the bladder, relatively stable from prior studies. The prostate is enlarged, measuring 5.5 cm in transverse dimension. No inguinal lymphadenopathy is seen.  No acute osseous abnormalities are identified. Facet disease is noted at the lower lumbar spine.  IMPRESSION: 1. Small to moderate right and small left pleural effusions seen. Patchy bilateral  airspace opacification, with underlying interstitial prominence, compatible with pulmonary edema. 2. Peritoneal metastasis at the right lower quadrant has increased in size, now measuring 2.2 x 1.9 cm, versus 1.6 x 1.0 cm on the prior study. This now obscures the right ureter at this level, with resultant dilatation of the right ureter and relatively severe right-sided hydronephrosis. Given the patient's left renal atrophy, this would explain the patient's acute renal failure. 3. Nonobstructing 6 mm stone within the dilated right ureter, proximal to the mass. 4. Severe left-sided renal atrophy, with several stones at the left renal pelvis. 5. Mildly enlarged right paratracheal node measures 1.4 cm in short axis; this is nonspecific in nature. It appears increased in size from 2014. 6. Scattered diverticulosis along the descending and sigmoid colon, without evidence of diverticulitis. 7. Nodular extension of the prostate at the base of the bladder, stable from  prior studies. Enlarged prostate seen. 8. Prominent bladder calculus again noted. 9. Diffuse coronary artery calcifications seen. 10. A few hypodensities within the liver, measuring up to 2.1 cm in size, appear relatively stable and may reflect cysts. 11. Scattered calcification along the abdominal aorta and its branches.  These results were called by telephone at the time of interpretation on 10/02/2013 at 5:38 am to [facility] on [facility], who verbally acknowledged these results.   Electronically Signed   By: Garald Balding M.D.   On: 10/02/2013 05:41   Dg Chest 2 View  10/01/2013   CLINICAL DATA:  Chest pain and shortness of breath  EXAM: CHEST  2 VIEW  COMPARISON:  08/20/2012 and prior radiographs  FINDINGS: Cardiomegaly and left-sided pacemaker again noted.  Left lower lobe airspace disease/ consolidation likely represents pneumonia.  There is no evidence of pleural effusion, pneumothorax or pulmonary edema.  No acute bony abnormalities are noted.  IMPRESSION: Left lower lobe airspace disease/consolidation likely representing pneumonia. Radiographic follow-up to resolution is recommended.  Cardiomegaly.   Electronically Signed   By: Hassan Rowan M.D.   On: 10/01/2013 20:55   Ct Chest Wo Contrast  10/02/2013   CLINICAL DATA:  Diffuse abdominal pain and chest pain. Assess for malignancy.  EXAM: CT CHEST, ABDOMEN AND PELVIS WITHOUT CONTRAST  TECHNIQUE: Multidetector CT imaging of the chest, abdomen and pelvis was performed following the standard protocol without IV contrast.  COMPARISON:  CT of the abdomen and pelvis performed 03/16/2013, and CT of the chest performed 08/08/2012  FINDINGS: CT CHEST FINDINGS  Small to moderate right and small left pleural effusions are seen. Patchy bilateral airspace opacities are noted, with underlying interstitial prominence, concerning for pulmonary edema. No pneumothorax is seen. No masses are identified, though evaluation is limited given airspace opacification.  Diffuse  coronary artery calcifications are seen. A mildly enlarged right paratracheal node is seen, measuring 1.4 cm in short axis. No definite hilar lymphadenopathy is appreciated. The great vessels are grossly unremarkable in appearance. No pericardial effusion is identified. A pacemaker lead is ending at the right ventricle; the pacemaker is seen at the anterior left chest wall.  The thyroid gland is unremarkable. No axillary lymphadenopathy is seen.  No acute osseous abnormalities are identified.  CT ABDOMEN AND PELVIS FINDINGS  A few hypodensities are noted within the liver, measuring up to 2.1 cm in size. These are relatively stable from the prior study and likely benign. A small calcified granuloma is noted near the hepatic hilum. The spleen is unremarkable in appearance. The patient is status post cholecystectomy, with clips noted at the gallbladder fossa.  The pancreas and adrenal glands are unremarkable.  The previously noted peritoneal metastasis at the right lower quadrant has increased in size, now measuring approximately 2.2 x 1.9 cm. This obscures the right ureter at this level, with resultant dilatation of the right ureter and relatively severe right-sided hydronephrosis.  A nonobstructing 6 mm stone is noted within the dilated right ureter, proximal to the mass. There is severe left-sided renal atrophy, with several stones noted at the left renal pelvis, measuring up to 8 mm in size.  No free fluid is identified. Postoperative change is noted about the proximal small bowel. The visualized small bowel is otherwise unremarkable. The stomach is within normal limits. No acute vascular abnormalities are seen. The IVC filter is noted in expected position. Scattered calcification is seen along the abdominal aorta and its branches.  The patient is status post appendectomy. Scattered diverticulosis is noted along the descending and sigmoid colon, without evidence of diverticulitis. Contrast progresses to the level of  the rectum. Postoperative change is noted at the proximal sigmoid colon.  The bladder is mildly distended. A prominent bladder calculus is again noted. There is nodular extension of the prostate at the base of the bladder, relatively stable from prior studies. The prostate is enlarged, measuring 5.5 cm in transverse dimension. No inguinal lymphadenopathy is seen.  No acute osseous abnormalities are identified. Facet disease is noted at the lower lumbar spine.  IMPRESSION: 1. Small to moderate right and small left pleural effusions seen. Patchy bilateral airspace opacification, with underlying interstitial prominence, compatible with pulmonary edema. 2. Peritoneal metastasis at the right lower quadrant has increased in size, now measuring 2.2 x 1.9 cm, versus 1.6 x 1.0 cm on the prior study. This now obscures the right ureter at this level, with resultant dilatation of the right ureter and relatively severe right-sided hydronephrosis. Given the patient's left renal atrophy, this would explain the patient's acute renal failure. 3. Nonobstructing 6 mm stone within the dilated right ureter, proximal to the mass. 4. Severe left-sided renal atrophy, with several stones at the left renal pelvis. 5. Mildly enlarged right paratracheal node measures 1.4 cm in short axis; this is nonspecific in nature. It appears increased in size from 2014. 6. Scattered diverticulosis along the descending and sigmoid colon, without evidence of diverticulitis. 7. Nodular extension of the prostate at the base of the bladder, stable from prior studies. Enlarged prostate seen. 8. Prominent bladder calculus again noted. 9. Diffuse coronary artery calcifications seen. 10. A few hypodensities within the liver, measuring up to 2.1 cm in size, appear relatively stable and may reflect cysts. 11. Scattered calcification along the abdominal aorta and its branches.  These results were called by telephone at the time of interpretation on 10/02/2013 at 5:38  am to [facility] on [facility], who verbally acknowledged these results.   Electronically Signed   By: Garald Balding M.D.   On: 10/02/2013 05:41    Scheduled Meds: . azithromycin  500 mg Intravenous Q24H  . cefTRIAXone (ROCEPHIN)  IV  1 g Intravenous Q24H  . sodium chloride  3 mL Intravenous Q12H   Continuous Infusions: . 0.9 % NaCl with KCl 20 mEq / L 50 mL/hr at 10/02/13 3536    Active Problems:   Chest pain   CAP (community acquired pneumonia)   AKI (acute kidney injury)   Cardiomyopathy  Time spent: 26min  Jelani Trueba, [facility] Pager [phone]. If 7PM-7AM, please contact night-coverage at [email or web address], password [email or web address] 10/02/2013, 4:56 PM  LOS: 1 day

## 2013-10-02 NOTE — Progress Notes (Signed)
After family left for the night, patient became very confused and agitated and continuously tried to get OOB. Patient's BP also elevated 194/106. MD on call notified, received order for PRN hydralazine for BP and one time dose of ativan for agitation. Medications administered as ordered. Patient currently resting comfortably with follow up BP of 156/89. Bed alarm in use, will continue to monitor. Justin Lang

## 2013-10-02 NOTE — Progress Notes (Signed)
Echocardiogram 2D Echocardiogram has been performed.  Justin Lang 10/02/2013, 8:59 AM

## 2013-10-02 NOTE — Progress Notes (Signed)
Admitted pt from ED alert oriented x 4 accompanied by wife. Oriented to room and call bell. Tele on VS taken.systolic slightly elevated but pt asymptomatic.IV NS w/ Kcl started. Denies any SOB nor chest pains. Continued to monitor

## 2013-10-02 NOTE — Consult Note (Signed)
Urology Consult   Physician requesting consult: Dr. Rhetta Mura  Reason for consult: Hydronephrosis and acute on chronic renal failure  History of Present Illness: Justin Lang. is a 80 y.o. male with complicated medical history most significantly including recurrent metastatic colorectal cancer now with pleural effusion and acute on chronic renal failure from obstruction of is functionally solitary right kidney due to enlarging peritoneal metastases. His CRC history dates back to 1997 when he had a sigmoid colectomy and chemotherapy. He then had a resection of a periaortic lymph node in 2002 consistent with metastatic disease, resection of cecal/appendiceal mass in 11/2011, and positive peritoneal mets diagnosed in 06/2012. He was started on chemotherapy in 08/2012 and was noted to have small residual peritoneal masses without progressive disease as of 03/2013 and no hydronephrosis.   He has a history of chronic renal failure with atrophic kidneys bilaterally (L>R) and bilateral renal stones. His baseline Cr as of 12/2012 was 1.7. He also has had a chronic bladder stone noted for at least several years.  The patient also has a history of a pulmonary embolism for which he has an IVC filter. He is on chronic coumadin therapy. He also has cardiomyopathy, and SA node dysfunction managed with a pace maker.   He presented to the ER yesterday with chest pain and right arm numbness, as well as malaise,  progressive general weakness, cough, abdominal pain diffusely, and anorexia. CT scans in ER revealed progressive peritoneal metastases obstructing the right ureter and causing proximal dilation.   2D echo today reveals severe LV dysfunction with an EF of 20-25% as well as diastolic dysfunction.   Past Medical History  Diagnosis Date  . DVT (deep venous thrombosis)     hx of s/p Greenfield filter  . OAB (overactive bladder)   . Arthritis   . Cataracts, bilateral   . FH: factor V Leiden deficiency   .  History of nephrolithiasis   . History of ankle fracture   . 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  . Peripheral neuropathy   . Vitamin B12 deficiency   . Symptomatic bradycardia   . Sinoatrial node dysfunction   . Sinoatrial node dysfunction   . Pacemaker 4 yrs ago  . Colon cancer 07/23/00    hx of metastic- treated with surgery and readiation  . Allergy     celebrex  . History of radiation therapy 09/02/00-10/07/00    recurrent colon cancer/left periaortic nodal recurrence=4500cGy/25 fxs,Dr.Wu  . Hx SBO     multiple intermittent and surgery  . History of chemotherapy     Past Surgical History  Procedure Laterality Date  . Colectomy      sigmoid secondary to colon ca- 1997  . Exploratory laparotomy      metastatic retroperitoneal lymph node- 2002  . Total knee arthroplasty      right-1991; left-1992  . Back surgeries      x3  . Filter replacement    . Left ankle surgery    . Incisional herniorraphy    . Pacemaker insertion  05/19/2008  . Replacement total knee bilateral    . Colon resection    . Colonoscopy  08/18/2010    Procedure: COLONOSCOPY;  Surgeon: Rogene Houston, MD;  Location: [facility];  Service: Endoscopy;  Laterality: N/A;  . Esophagogastroduodenoscopy  08/18/2010    Procedure: ESOPHAGOGASTRODUODENOSCOPY (EGD);  Surgeon: Rogene Houston, MD;  Location: [facility];  Service: Endoscopy;  Laterality: N/A;  .  Partial colectomy  11/28/2011    Procedure: PARTIAL COLECTOMY;  Surgeon: Jamesetta So, MD;  Location: [facility];  Service: General;  Laterality: N/A;  . Laparoscopic ileocecectomy  11/28/11  . Appendectomy      Medications:  Home meds:    Medication List    ASK your doctor about these medications       cholecalciferol 1000 UNITS tablet  Commonly known as:  VITAMIN D  Take 2,000 Units by mouth at bedtime.     diphenoxylate-atropine 2.5-0.025 MG per tablet  Commonly known as:  LOMOTIL  Take 1 tablet by mouth 4 (four) times daily as  needed for diarrhea or loose stools.     mupirocin ointment 2 %  Commonly known as:  BACTROBAN  Apply 1 application topically 2 (two) times daily.     warfarin 2 MG tablet  Commonly known as:  COUMADIN  Take 1 tablet (2 mg total) by mouth as directed. Taken with 5mg  tablet on Mon, Wed & Fridays.     warfarin 5 MG tablet  Commonly known as:  COUMADIN  Take 5 mg by mouth daily except 7 mg on Tues/Thurs or as directed.        Scheduled Meds: . azithromycin  500 mg Intravenous Q24H  . cefTRIAXone (ROCEPHIN)  IV  1 g Intravenous Q24H  . sodium chloride  3 mL Intravenous Q12H   Continuous Infusions: . 0.9 % NaCl with KCl 20 mEq / L 50 mL/hr at 10/02/13 0618   PRN Meds:.  Allergies:  Allergies  Allergen Reactions  . Vectibix [Panitumumab] Other (See Comments)    On 08/20/12 during Vectibix infusion at [facility] developed difficulty swallowing, unable answer questions due to lips/throat feeling "funny", confusion, with no affect. Has difficulty getting words out.  . Celecoxib Rash    CELEBREX    Family History  Problem Relation Age of Onset  . Anesthesia problems Neg Hx   . Hypotension Neg Hx   . Malignant hyperthermia Neg Hx   . Pseudochol deficiency Neg Hx   . Cancer Sister     Social History:  reports that he has never smoked. He has never used smokeless tobacco. He reports that he does not drink alcohol or use illicit drugs.  ROS: A complete review of systems was performed.  All systems are negative except for pertinent findings as noted.  Physical Exam:  Vital signs in last 24 hours: Temp:  [97.4 F (36.3 C)-98.2 F (36.8 C)] 98.2 F (36.8 C) (09/18 1449) Pulse Rate:  [63-73] 73 (09/18 1449) Resp:  [13-23] 20 (09/18 1449) BP: (152-182)/(74-93) 163/90 mmHg (09/18 1449) SpO2:  [96 %-99 %] 96 % (09/18 1449) Weight:  [75.7 kg (166 lb 14.2 oz)-81.647 kg (180 lb)] 75.7 kg (166 lb 14.2 oz) (09/18 0046) Constitutional:  Alert and oriented, No acute distress Cardiovascular:  Regular rate and rhythm, No JVD Respiratory: Normal respiratory effort, Lungs clear bilaterally GI: Abdomen is soft, nontender, nondistended, prior abdominal scars well healed Genitourinary: No CVAT. Lymphatic: No lymphadenopathy Neurologic: Grossly intact, no focal deficits. Poor short term memory but appears to comprehend discussion. Psychiatric: Normal mood and affect  Laboratory Data:   Recent Labs  10/01/13 1845 10/01/13 2315 10/02/13 0455  WBC 8.4 8.1 7.1  HGB 13.4 12.5* 12.3*  HCT 39.9 36.6* 36.1*  PLT 249 245 225     Recent Labs  10/01/13 1845 10/01/13 2315 10/02/13 0455  NA 143  --  142  K 3.4*  --  3.1*  CL 103  --  102  GLUCOSE 150*  --  101*  BUN 43*  --  45*  CALCIUM 8.5  --  8.1*  CREATININE 5.37* 5.14* 5.32*   Baseline Cr 1.7 in 2014 INR 4.7  Renal Function:  Recent Labs  10/01/13 1845 10/01/13 2315 10/02/13 0455  CREATININE 5.37* 5.14* 5.32*   Estimated Creatinine Clearance: 9.3 ml/min (by C-G formula based on Cr of 5.32).  Radiologic Imaging: Ct Abdomen Pelvis Wo Contrast  10/02/2013   CLINICAL DATA:  Diffuse abdominal pain and chest pain. Assess for malignancy.  EXAM: CT CHEST, ABDOMEN AND PELVIS WITHOUT CONTRAST  TECHNIQUE: Multidetector CT imaging of the chest, abdomen and pelvis was performed following the standard protocol without IV contrast.  COMPARISON:  CT of the abdomen and pelvis performed 03/16/2013, and CT of the chest performed 08/08/2012  FINDINGS: CT CHEST FINDINGS  Small to moderate right and small left pleural effusions are seen. Patchy bilateral airspace opacities are noted, with underlying interstitial prominence, concerning for pulmonary edema. No pneumothorax is seen. No masses are identified, though evaluation is limited given airspace opacification.  Diffuse coronary artery calcifications are seen. A mildly enlarged right paratracheal node is seen, measuring 1.4 cm in short axis. No definite hilar lymphadenopathy is  appreciated. The great vessels are grossly unremarkable in appearance. No pericardial effusion is identified. A pacemaker lead is ending at the right ventricle; the pacemaker is seen at the anterior left chest wall.  The thyroid gland is unremarkable. No axillary lymphadenopathy is seen.  No acute osseous abnormalities are identified.  CT ABDOMEN AND PELVIS FINDINGS  A few hypodensities are noted within the liver, measuring up to 2.1 cm in size. These are relatively stable from the prior study and likely benign. A small calcified granuloma is noted near the hepatic hilum. The spleen is unremarkable in appearance. The patient is status post cholecystectomy, with clips noted at the gallbladder fossa. The pancreas and adrenal glands are unremarkable.  The previously noted peritoneal metastasis at the right lower quadrant has increased in size, now measuring approximately 2.2 x 1.9 cm. This obscures the right ureter at this level, with resultant dilatation of the right ureter and relatively severe right-sided hydronephrosis.  A nonobstructing 6 mm stone is noted within the dilated right ureter, proximal to the mass. There is severe left-sided renal atrophy, with several stones noted at the left renal pelvis, measuring up to 8 mm in size.  No free fluid is identified. Postoperative change is noted about the proximal small bowel. The visualized small bowel is otherwise unremarkable. The stomach is within normal limits. No acute vascular abnormalities are seen. The IVC filter is noted in expected position. Scattered calcification is seen along the abdominal aorta and its branches.  The patient is status post appendectomy. Scattered diverticulosis is noted along the descending and sigmoid colon, without evidence of diverticulitis. Contrast progresses to the level of the rectum. Postoperative change is noted at the proximal sigmoid colon.  The bladder is mildly distended. A prominent bladder calculus is again noted. There is  nodular extension of the prostate at the base of the bladder, relatively stable from prior studies. The prostate is enlarged, measuring 5.5 cm in transverse dimension. No inguinal lymphadenopathy is seen.  No acute osseous abnormalities are identified. Facet disease is noted at the lower lumbar spine.  IMPRESSION: 1. Small to moderate right and small left pleural effusions seen. Patchy bilateral airspace opacification, with underlying interstitial prominence, compatible with pulmonary edema. 2. Peritoneal  metastasis at the right lower quadrant has increased in size, now measuring 2.2 x 1.9 cm, versus 1.6 x 1.0 cm on the prior study. This now obscures the right ureter at this level, with resultant dilatation of the right ureter and relatively severe right-sided hydronephrosis. Given the patient's left renal atrophy, this would explain the patient's acute renal failure. 3. Nonobstructing 6 mm stone within the dilated right ureter, proximal to the mass. 4. Severe left-sided renal atrophy, with several stones at the left renal pelvis. 5. Mildly enlarged right paratracheal node measures 1.4 cm in short axis; this is nonspecific in nature. It appears increased in size from 2014. 6. Scattered diverticulosis along the descending and sigmoid colon, without evidence of diverticulitis. 7. Nodular extension of the prostate at the base of the bladder, stable from prior studies. Enlarged prostate seen. 8. Prominent bladder calculus again noted. 9. Diffuse coronary artery calcifications seen. 10. A few hypodensities within the liver, measuring up to 2.1 cm in size, appear relatively stable and may reflect cysts. 11. Scattered calcification along the abdominal aorta and its branches.  These results were called by telephone at the time of interpretation on 10/02/2013 at 5:38 am to [facility] on [facility], who verbally acknowledged these results.   Electronically Signed   By: Garald Balding M.D.   On: 10/02/2013 05:41   Dg Chest 2  View  10/01/2013   CLINICAL DATA:  Chest pain and shortness of breath  EXAM: CHEST  2 VIEW  COMPARISON:  08/20/2012 and prior radiographs  FINDINGS: Cardiomegaly and left-sided pacemaker again noted.  Left lower lobe airspace disease/ consolidation likely represents pneumonia.  There is no evidence of pleural effusion, pneumothorax or pulmonary edema.  No acute bony abnormalities are noted.  IMPRESSION: Left lower lobe airspace disease/consolidation likely representing pneumonia. Radiographic follow-up to resolution is recommended.  Cardiomegaly.   Electronically Signed   By: Hassan Rowan M.D.   On: 10/01/2013 20:55   Ct Chest Wo Contrast  10/02/2013   CLINICAL DATA:  Diffuse abdominal pain and chest pain. Assess for malignancy.  EXAM: CT CHEST, ABDOMEN AND PELVIS WITHOUT CONTRAST  TECHNIQUE: Multidetector CT imaging of the chest, abdomen and pelvis was performed following the standard protocol without IV contrast.  COMPARISON:  CT of the abdomen and pelvis performed 03/16/2013, and CT of the chest performed 08/08/2012  FINDINGS: CT CHEST FINDINGS  Small to moderate right and small left pleural effusions are seen. Patchy bilateral airspace opacities are noted, with underlying interstitial prominence, concerning for pulmonary edema. No pneumothorax is seen. No masses are identified, though evaluation is limited given airspace opacification.  Diffuse coronary artery calcifications are seen. A mildly enlarged right paratracheal node is seen, measuring 1.4 cm in short axis. No definite hilar lymphadenopathy is appreciated. The great vessels are grossly unremarkable in appearance. No pericardial effusion is identified. A pacemaker lead is ending at the right ventricle; the pacemaker is seen at the anterior left chest wall.  The thyroid gland is unremarkable. No axillary lymphadenopathy is seen.  No acute osseous abnormalities are identified.  CT ABDOMEN AND PELVIS FINDINGS  A few hypodensities are noted within the liver,  measuring up to 2.1 cm in size. These are relatively stable from the prior study and likely benign. A small calcified granuloma is noted near the hepatic hilum. The spleen is unremarkable in appearance. The patient is status post cholecystectomy, with clips noted at the gallbladder fossa. The pancreas and adrenal glands are unremarkable.  The previously noted peritoneal  metastasis at the right lower quadrant has increased in size, now measuring approximately 2.2 x 1.9 cm. This obscures the right ureter at this level, with resultant dilatation of the right ureter and relatively severe right-sided hydronephrosis.  A nonobstructing 6 mm stone is noted within the dilated right ureter, proximal to the mass. There is severe left-sided renal atrophy, with several stones noted at the left renal pelvis, measuring up to 8 mm in size.  No free fluid is identified. Postoperative change is noted about the proximal small bowel. The visualized small bowel is otherwise unremarkable. The stomach is within normal limits. No acute vascular abnormalities are seen. The IVC filter is noted in expected position. Scattered calcification is seen along the abdominal aorta and its branches.  The patient is status post appendectomy. Scattered diverticulosis is noted along the descending and sigmoid colon, without evidence of diverticulitis. Contrast progresses to the level of the rectum. Postoperative change is noted at the proximal sigmoid colon.  The bladder is mildly distended. A prominent bladder calculus is again noted. There is nodular extension of the prostate at the base of the bladder, relatively stable from prior studies. The prostate is enlarged, measuring 5.5 cm in transverse dimension. No inguinal lymphadenopathy is seen.  No acute osseous abnormalities are identified. Facet disease is noted at the lower lumbar spine.  IMPRESSION: 1. Small to moderate right and small left pleural effusions seen. Patchy bilateral airspace  opacification, with underlying interstitial prominence, compatible with pulmonary edema. 2. Peritoneal metastasis at the right lower quadrant has increased in size, now measuring 2.2 x 1.9 cm, versus 1.6 x 1.0 cm on the prior study. This now obscures the right ureter at this level, with resultant dilatation of the right ureter and relatively severe right-sided hydronephrosis. Given the patient's left renal atrophy, this would explain the patient's acute renal failure. 3. Nonobstructing 6 mm stone within the dilated right ureter, proximal to the mass. 4. Severe left-sided renal atrophy, with several stones at the left renal pelvis. 5. Mildly enlarged right paratracheal node measures 1.4 cm in short axis; this is nonspecific in nature. It appears increased in size from 2014. 6. Scattered diverticulosis along the descending and sigmoid colon, without evidence of diverticulitis. 7. Nodular extension of the prostate at the base of the bladder, stable from prior studies. Enlarged prostate seen. 8. Prominent bladder calculus again noted. 9. Diffuse coronary artery calcifications seen. 10. A few hypodensities within the liver, measuring up to 2.1 cm in size, appear relatively stable and may reflect cysts. 11. Scattered calcification along the abdominal aorta and its branches.  These results were called by telephone at the time of interpretation on 10/02/2013 at 5:38 am to [facility] on [facility], who verbally acknowledged these results.   Electronically Signed   By: Garald Balding M.D.   On: 10/02/2013 05:41    I independently reviewed the above imaging studies. Patient has peritoneal mass that is obstructing the right kidney causing proximal dilation and hydronephrosis. Small stone in ureter proximal to external compression is not obstructing. Bilateral renal atrophy L>R.  Right pleural effusion.  Impression/Recommendation 69M with stage 4 CRC with progressively enlarged peritoneal mets obstructing functionally solitary  right kidney, now with acute on chronic renal failure likely contributing to his acute symptoms of malaise, weakness, and anorexia.   We discussed with primary oncologist Dr. Benay Spice who indicated that this seems to be a slowly progressing cancer given it's protracted course, preserving renal function via stent or nephrostomy tube is reasonable.  No additional oncologic treatment is planned.  Long discussion with the patient and family was had including options for observation, ureteral stent, and percutaneous nephrostomy tube. After thoroughly considering the risks and benefits of each, as well as quality of life considerations, the patient and family jointly elected for right percutaneous nephrostomy tube.  Recommendation -- reverse INR -- consult IR for right PCN -- Dr. Benay Spice will follow up with the patient if he is still in house on Monday  Thank you for allowing [place] to participate in the care of this gentleman.    Addendum:  I performed a history and physical examination of the patient and discussed his management with the resident.  I reviewed the resident's note and agree with the documented findings and plan of care. While I believe a more appropriate care directive might be strong consideration for hospice involvement the family is interested in pursuing drainage of the right kidney. It does appear that Mr. Dills has had a very indolent malignancy up to this point and may be able to continue to do well with a nephrostomy tube. JJ stent placement is an option but often can be difficult in the face of external compression from malignancy. JJ stents often draining very poorly in the face of extrinsic compression and patient's typically do better with nephrostomy tube drainage. Patient's INR will need to be reversed. There is no urgent need to do that.

## 2013-10-02 NOTE — Progress Notes (Signed)
ANTICOAGULATION CONSULT NOTE - Initial Consult  Pharmacy Consult for Warfarin  Indication: hx DVT  Allergies  Allergen Reactions  . Vectibix [Panitumumab] Other (See Comments)    On 08/20/12 during Vectibix infusion at [facility] developed difficulty swallowing, unable answer questions due to lips/throat feeling "funny", confusion, with no affect. Has difficulty getting words out.  . Celecoxib Rash    CELEBREX    Patient Measurements: Height: 5\' 8"  (172.7 cm) Weight: 166 lb 14.2 oz (75.7 kg) IBW/kg (Calculated) : 68.4  Vital Signs: Temp: 97.5 F (36.4 C) (09/18 0437) Temp src: Oral (09/18 0437) BP: 152/89 mmHg (09/18 0437) Pulse Rate: 67 (09/18 0437)  Labs:  Recent Labs  10/01/13 1845 10/01/13 2315 10/02/13 0455  HGB 13.4 12.5* 12.3*  HCT 39.9 36.6* 36.1*  PLT 249 245 225  APTT 73*  --   --   LABPROT 40.2*  --  44.1*  INR 4.16*  --  4.68*  CREATININE 5.37* 5.14* 5.32*  TROPONINI  --  <0.30 <0.30   Medical History: Past Medical History  Diagnosis Date  . DVT (deep venous thrombosis)     hx of s/p Greenfield filter  . OAB (overactive bladder)   . Arthritis   . Cataracts, bilateral   . FH: factor V Leiden deficiency   . History of nephrolithiasis   . History of ankle fracture   . 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  . Peripheral neuropathy   . Vitamin B12 deficiency   . Symptomatic bradycardia   . Sinoatrial node dysfunction   . Sinoatrial node dysfunction   . Pacemaker 4 yrs ago  . Colon cancer 07/23/00    hx of metastic- treated with surgery and readiation  . Allergy     celebrex  . History of radiation therapy 09/02/00-10/07/00    recurrent colon cancer/left periaortic nodal recurrence=4500cGy/25 fxs,Dr.Wu  . Hx SBO     multiple intermittent and surgery  . History of chemotherapy     Assessment: Pt on warfarin for hx DVT. INR remains SUPRA-therapeutic at 4.68 this morning, coumadin has been on hold since admission. CBC wnl.   PTA doses:  5mg  daily except for 7mg  on Tues, Thurs per coumadin clinic note in August  Goal of Therapy:  INR 2-3 Monitor platelets by anticoagulation protocol: Yes   Plan:  -Continue HOLD warfarin for now -Daily PT/INR -Re-start warfarin when indicated   Maryanna Shape, PharmD, BCPS  Clinical Pharmacist  Pager: [phone]   10/02/2013,10:56 AM

## 2013-10-02 NOTE — Progress Notes (Signed)
UR complete.  Saharra Santo RN, MSN 

## 2013-10-03 DIAGNOSIS — R413 Other amnesia: Secondary | ICD-10-CM

## 2013-10-03 DIAGNOSIS — Z7901 Long term (current) use of anticoagulants: Secondary | ICD-10-CM

## 2013-10-03 LAB — COMPREHENSIVE METABOLIC PANEL
ALBUMIN: 2.9 g/dL — AB (ref 3.5–5.2)
ALK PHOS: 64 U/L (ref 39–117)
ALT: <5 U/L (ref 0–53)
AST: 14 U/L (ref 0–37)
Anion gap: 19 — ABNORMAL HIGH (ref 5–15)
BILIRUBIN TOTAL: 0.4 mg/dL (ref 0.3–1.2)
BUN: 45 mg/dL — ABNORMAL HIGH (ref 6–23)
CHLORIDE: 105 meq/L (ref 96–112)
CO2: 19 mEq/L (ref 19–32)
Calcium: 8.5 mg/dL (ref 8.4–10.5)
Creatinine, Ser: 5.37 mg/dL — ABNORMAL HIGH (ref 0.50–1.35)
GFR calc Af Amer: 10 mL/min — ABNORMAL LOW (ref >90)
GFR calc non Af Amer: 9 mL/min — ABNORMAL LOW (ref >90)
Glucose, Bld: 124 mg/dL — ABNORMAL HIGH (ref 70–99)
POTASSIUM: 3.2 meq/L — AB (ref 3.7–5.3)
SODIUM: 143 meq/L (ref 137–147)
Total Protein: 6.8 g/dL (ref 6.0–8.3)

## 2013-10-03 LAB — CBC WITH DIFFERENTIAL/PLATELET
BASOS PCT: 0 % (ref 0–1)
Basophils Absolute: 0 10*3/uL (ref 0.0–0.1)
Eosinophils Absolute: 0.2 10*3/uL (ref 0.0–0.7)
Eosinophils Relative: 2 % (ref 0–5)
HEMATOCRIT: 38.9 % — AB (ref 39.0–52.0)
Hemoglobin: 13.1 g/dL (ref 13.0–17.0)
LYMPHS PCT: 8 % — AB (ref 12–46)
Lymphs Abs: 0.8 10*3/uL (ref 0.7–4.0)
MCH: 29.6 pg (ref 26.0–34.0)
MCHC: 33.7 g/dL (ref 30.0–36.0)
MCV: 87.8 fL (ref 78.0–100.0)
MONO ABS: 0.9 10*3/uL (ref 0.1–1.0)
MONOS PCT: 10 % (ref 3–12)
Neutro Abs: 7.5 10*3/uL (ref 1.7–7.7)
Neutrophils Relative %: 80 % — ABNORMAL HIGH (ref 43–77)
Platelets: 257 10*3/uL (ref 150–400)
RBC: 4.43 MIL/uL (ref 4.22–5.81)
RDW: 14.9 % (ref 11.5–15.5)
WBC: 9.4 10*3/uL (ref 4.0–10.5)

## 2013-10-03 LAB — URINE CULTURE: Colony Count: 6000

## 2013-10-03 LAB — MAGNESIUM: MAGNESIUM: 1.8 mg/dL (ref 1.5–2.5)

## 2013-10-03 LAB — LEGIONELLA ANTIGEN, URINE: Legionella Antigen, Urine: NEGATIVE

## 2013-10-03 LAB — PROTIME-INR
INR: 4.9 — ABNORMAL HIGH (ref 0.00–1.49)
Prothrombin Time: 45.7 seconds — ABNORMAL HIGH (ref 11.6–15.2)

## 2013-10-03 NOTE — Progress Notes (Signed)
ANTICOAGULATION CONSULT NOTE - Initial Consult  Pharmacy Consult for Warfarin  Indication: hx DVT  Allergies  Allergen Reactions  . Vectibix [Panitumumab] Other (See Comments)    On 08/20/12 during Vectibix infusion at [facility] developed difficulty swallowing, unable answer questions due to lips/throat feeling "funny", confusion, with no affect. Has difficulty getting words out.  . Celecoxib Rash    CELEBREX    Patient Measurements: Height: 5\' 8"  (172.7 cm) Weight: 167 lb 15.9 oz (76.2 kg) IBW/kg (Calculated) : 68.4  Vital Signs: Temp: 97.8 F (36.6 C) (09/19 0450) Temp src: Axillary (09/19 0450) BP: 155/94 mmHg (09/19 0450) Pulse Rate: 100 (09/19 0450)  Labs:  Recent Labs  10/01/13 1845 10/01/13 2315 10/02/13 0455 10/02/13 0955 10/03/13 0403  HGB 13.4 12.5* 12.3*  --  13.1  HCT 39.9 36.6* 36.1*  --  38.9*  PLT 249 245 225  --  257  APTT 73*  --   --   --   --   LABPROT 40.2*  --  44.1*  --  45.7*  INR 4.16*  --  4.68*  --  4.90*  CREATININE 5.37* 5.14* 5.32*  --  5.37*  TROPONINI  --  <0.30 <0.30 <0.30  --    Medical History: Past Medical History  Diagnosis Date  . DVT (deep venous thrombosis)     hx of s/p Greenfield filter  . OAB (overactive bladder)   . Arthritis   . Cataracts, bilateral   . FH: factor V Leiden deficiency   . History of nephrolithiasis   . History of ankle fracture   . 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  . Peripheral neuropathy   . Vitamin B12 deficiency   . Symptomatic bradycardia   . Sinoatrial node dysfunction   . Sinoatrial node dysfunction   . Pacemaker 4 yrs ago  . Colon cancer 07/23/00    hx of metastic- treated with surgery and readiation  . Allergy     celebrex  . History of radiation therapy 09/02/00-10/07/00    recurrent colon cancer/left periaortic nodal recurrence=4500cGy/25 fxs,Dr.Wu  . Hx SBO     multiple intermittent and surgery  . History of chemotherapy     Assessment: Pt on warfarin for hx DVT.  INR remains SUPRA-therapeutic and trending up to 4.9 this morning, coumadin has been on hold since admission. CBC stable and wnl. Per urology note, might require nephrostomy tube placement Sunday or Monday, will need INR to normalize before procedure, so might require vitamin K to reverse coumadin effect.   PTA doses: 5mg  daily except for 7mg  on Tues, Thurs per coumadin clinic note in August  Goal of Therapy:  INR 2-3 Monitor platelets by anticoagulation protocol: Yes   Plan:  -Continue HOLD warfarin for now -Daily PT/INR -If reversal is needed, recommend vitamin K 5mg  IV or PO x 1 -Re-start warfarin when indicated   Maryanna Shape, PharmD, BCPS  Clinical Pharmacist  Pager: [phone]   10/03/2013,12:05 PM

## 2013-10-03 NOTE — Plan of Care (Signed)
Problem: Phase I Progression Outcomes Goal: Voiding-avoid urinary catheter unless indicated Outcome: Progressing Continent at this time.

## 2013-10-03 NOTE — Progress Notes (Signed)
Pharmacist Heart Failure Core Measure Documentation  Assessment: Moishy Laday. has an EF documented as 20-25% on 9/18 by 2D Echo.  Rationale: Heart failure patients with left ventricular systolic dysfunction (LVSD) and an EF < 40% should be prescribed an angiotensin converting enzyme inhibitor (ACEI) or angiotensin receptor blocker (ARB) at discharge unless a contraindication is documented in the medical record.  This patient is not currently on an ACEI or ARB for HF.  This note is being placed in the record in order to provide documentation that a contraindication to the use of these agents is present for this encounter.  ACE Inhibitor or Angiotensin Receptor Blocker is contraindicated (specify all that apply)  []   ACEI allergy AND ARB allergy []   Angioedema []   Moderate or severe aortic stenosis []   Hyperkalemia []   Hypotension []   Renal artery stenosis [x]   Worsening renal function, preexisting renal disease or dysfunction   [name] 10/03/2013 7:44 PM

## 2013-10-03 NOTE — Progress Notes (Signed)
[facility] PROGRESS NOTE  Justin Lang. KDT:[id number] DOB: 09/05/1922 DOA: 10/01/2013 PCP: Delphina Cahill, MD  Assessment/Plan: 1. Chest pain 1. Serial trop neg 2. 2d echo with no WMA but with 99-83% EF with diastolic dysfunction (see below) 2. 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 Pt continues to be compensated at this time 2. Presenting BNP was markedly elevated, but question if renal failure is contributing 3. Given markedly low EF of 20-25%, would cont to avoid aggressive volume load - including blood products if we can avoid them (see below) 3. CAP 1. Afebrile 2. Tolerating rocephin and azithrymycin 4. AKI 1. Cr worsened slightly overnight 2. CT abd with enlarged metastatic lesion that appears to be resulting in R ureteral obstruction and hydroureter 3. Appreciate Urology input, with recs for PCN when INR reverses 4. INR remains too supertherapeutic for intervention (see below) 5. Metastatic colon ca 1. Followed by Oncology as outpatient 6. Hx DVT 1. Pt with hypercoagulable state, is s/p IVC filter placement 2. INR supertherapeutic 3. No signs of active bleed 4. Coumadin remains on hold 5. Given EF of 20%, would avoid volume load at this time in the form of blood products such as FFP 6. Will continue conservative approach for now and monitor INR closely 7. Coumadin coagulopathy 1.  Per above  Code Status: DNR Family Communication: Pt and wife in room Disposition Plan: Pending   Consultants:  Urology  Procedures:    Antibiotics:  Rocephin 9/17>>>  Azithromycin 9/17>>>  HPI/Subjective: Noted to be confused overnight  Objective: Filed Vitals:   10/02/13 1836 10/02/13 2019 10/02/13 2200 10/03/13 0450  BP: 149/89 197/107 156/89 155/94  Pulse: 75 99  100  Temp: 97.4 F (36.3 C) 97.6 F (36.4 C)  97.8 F (36.6 C)  TempSrc: Oral Oral  Axillary  Resp: 20 18  18   Height:      Weight:    76.2 kg (167 lb 15.9 oz)  SpO2: 95% 96%   99%    Intake/Output Summary (Last 24 hours) at 10/03/13 1113 Last data filed at 10/03/13 0917  Gross per 24 hour  Intake    825 ml  Output    402 ml  Net    423 ml   Filed Weights   10/01/13 1844 10/02/13 0046 10/03/13 0450  Weight: 81.647 kg (180 lb) 75.7 kg (166 lb 14.2 oz) 76.2 kg (167 lb 15.9 oz)    Exam:   General:  Awake, in nad  Cardiovascular: regular, s1, s2  Respiratory: normal resp effort, no wheezing  Abdomen: soft,nondistended  Musculoskeletal: perfused, no clubbing   Data Reviewed: Basic Metabolic Panel:  Recent Labs Lab 10/01/13 1845 10/01/13 2315 10/02/13 0455 10/03/13 0403  NA 143  --  142 143  K 3.4*  --  3.1* 3.2*  CL 103  --  102 105  CO2 23  --  21 19  GLUCOSE 150*  --  101* 124*  BUN 43*  --  45* 45*  CREATININE 5.37* 5.14* 5.32* 5.37*  CALCIUM 8.5  --  8.1* 8.5   Liver Function Tests:  Recent Labs Lab 10/01/13 1845 10/02/13 0455 10/03/13 0403  AST 13 22 14   ALT 8 5 <5  ALKPHOS 69 61 64  BILITOT 0.4 0.4 0.4  PROT 7.3 6.3 6.8  ALBUMIN 3.1* 2.7* 2.9*   No results found for this basename: LIPASE, AMYLASE,  in the last 168 hours No results found for this basename: AMMONIA,  in the last 168  hours CBC:  Recent Labs Lab 10/01/13 1845 10/01/13 2315 10/02/13 0455 10/03/13 0403  WBC 8.4 8.1 7.1 9.4  NEUTROABS  --   --  4.4 7.5  HGB 13.4 12.5* 12.3* 13.1  HCT 39.9 36.6* 36.1* 38.9*  MCV 88.1 87.4 87.0 87.8  PLT 249 245 225 257   Cardiac Enzymes:  Recent Labs Lab 10/01/13 2315 10/02/13 0455 10/02/13 0955  TROPONINI <0.30 <0.30 <0.30   BNP (last 3 results)  Recent Labs  10/01/13 1845  PROBNP 17357.0*   CBG: No results found for this basename: GLUCAP,  in the last 168 hours  No results found for this or any previous visit (from the past 240 hour(s)).   Studies: Ct Abdomen Pelvis Wo Contrast  10/02/2013   CLINICAL DATA:  Diffuse abdominal pain and chest pain. Assess for malignancy.  EXAM: CT CHEST, ABDOMEN AND  PELVIS WITHOUT CONTRAST  TECHNIQUE: Multidetector CT imaging of the chest, abdomen and pelvis was performed following the standard protocol without IV contrast.  COMPARISON:  CT of the abdomen and pelvis performed 03/16/2013, and CT of the chest performed 08/08/2012  FINDINGS: CT CHEST FINDINGS  Small to moderate right and small left pleural effusions are seen. Patchy bilateral airspace opacities are noted, with underlying interstitial prominence, concerning for pulmonary edema. No pneumothorax is seen. No masses are identified, though evaluation is limited given airspace opacification.  Diffuse coronary artery calcifications are seen. A mildly enlarged right paratracheal node is seen, measuring 1.4 cm in short axis. No definite hilar lymphadenopathy is appreciated. The great vessels are grossly unremarkable in appearance. No pericardial effusion is identified. A pacemaker lead is ending at the right ventricle; the pacemaker is seen at the anterior left chest wall.  The thyroid gland is unremarkable. No axillary lymphadenopathy is seen.  No acute osseous abnormalities are identified.  CT ABDOMEN AND PELVIS FINDINGS  A few hypodensities are noted within the liver, measuring up to 2.1 cm in size. These are relatively stable from the prior study and likely benign. A small calcified granuloma is noted near the hepatic hilum. The spleen is unremarkable in appearance. The patient is status post cholecystectomy, with clips noted at the gallbladder fossa. The pancreas and adrenal glands are unremarkable.  The previously noted peritoneal metastasis at the right lower quadrant has increased in size, now measuring approximately 2.2 x 1.9 cm. This obscures the right ureter at this level, with resultant dilatation of the right ureter and relatively severe right-sided hydronephrosis.  A nonobstructing 6 mm stone is noted within the dilated right ureter, proximal to the mass. There is severe left-sided renal atrophy, with several  stones noted at the left renal pelvis, measuring up to 8 mm in size.  No free fluid is identified. Postoperative change is noted about the proximal small bowel. The visualized small bowel is otherwise unremarkable. The stomach is within normal limits. No acute vascular abnormalities are seen. The IVC filter is noted in expected position. Scattered calcification is seen along the abdominal aorta and its branches.  The patient is status post appendectomy. Scattered diverticulosis is noted along the descending and sigmoid colon, without evidence of diverticulitis. Contrast progresses to the level of the rectum. Postoperative change is noted at the proximal sigmoid colon.  The bladder is mildly distended. A prominent bladder calculus is again noted. There is nodular extension of the prostate at the base of the bladder, relatively stable from prior studies. The prostate is enlarged, measuring 5.5 cm in transverse dimension. No inguinal lymphadenopathy  is seen.  No acute osseous abnormalities are identified. Facet disease is noted at the lower lumbar spine.  IMPRESSION: 1. Small to moderate right and small left pleural effusions seen. Patchy bilateral airspace opacification, with underlying interstitial prominence, compatible with pulmonary edema. 2. Peritoneal metastasis at the right lower quadrant has increased in size, now measuring 2.2 x 1.9 cm, versus 1.6 x 1.0 cm on the prior study. This now obscures the right ureter at this level, with resultant dilatation of the right ureter and relatively severe right-sided hydronephrosis. Given the patient's left renal atrophy, this would explain the patient's acute renal failure. 3. Nonobstructing 6 mm stone within the dilated right ureter, proximal to the mass. 4. Severe left-sided renal atrophy, with several stones at the left renal pelvis. 5. Mildly enlarged right paratracheal node measures 1.4 cm in short axis; this is nonspecific in nature. It appears increased in size  from 2014. 6. Scattered diverticulosis along the descending and sigmoid colon, without evidence of diverticulitis. 7. Nodular extension of the prostate at the base of the bladder, stable from prior studies. Enlarged prostate seen. 8. Prominent bladder calculus again noted. 9. Diffuse coronary artery calcifications seen. 10. A few hypodensities within the liver, measuring up to 2.1 cm in size, appear relatively stable and may reflect cysts. 11. Scattered calcification along the abdominal aorta and its branches.  These results were called by telephone at the time of interpretation on 10/02/2013 at 5:38 am to [facility] on [facility], who verbally acknowledged these results.   Electronically Signed   By: Garald Balding M.D.   On: 10/02/2013 05:41   Dg Chest 2 View  10/01/2013   CLINICAL DATA:  Chest pain and shortness of breath  EXAM: CHEST  2 VIEW  COMPARISON:  08/20/2012 and prior radiographs  FINDINGS: Cardiomegaly and left-sided pacemaker again noted.  Left lower lobe airspace disease/ consolidation likely represents pneumonia.  There is no evidence of pleural effusion, pneumothorax or pulmonary edema.  No acute bony abnormalities are noted.  IMPRESSION: Left lower lobe airspace disease/consolidation likely representing pneumonia. Radiographic follow-up to resolution is recommended.  Cardiomegaly.   Electronically Signed   By: Hassan Rowan M.D.   On: 10/01/2013 20:55   Ct Chest Wo Contrast  10/02/2013   CLINICAL DATA:  Diffuse abdominal pain and chest pain. Assess for malignancy.  EXAM: CT CHEST, ABDOMEN AND PELVIS WITHOUT CONTRAST  TECHNIQUE: Multidetector CT imaging of the chest, abdomen and pelvis was performed following the standard protocol without IV contrast.  COMPARISON:  CT of the abdomen and pelvis performed 03/16/2013, and CT of the chest performed 08/08/2012  FINDINGS: CT CHEST FINDINGS  Small to moderate right and small left pleural effusions are seen. Patchy bilateral airspace opacities are noted, with  underlying interstitial prominence, concerning for pulmonary edema. No pneumothorax is seen. No masses are identified, though evaluation is limited given airspace opacification.  Diffuse coronary artery calcifications are seen. A mildly enlarged right paratracheal node is seen, measuring 1.4 cm in short axis. No definite hilar lymphadenopathy is appreciated. The great vessels are grossly unremarkable in appearance. No pericardial effusion is identified. A pacemaker lead is ending at the right ventricle; the pacemaker is seen at the anterior left chest wall.  The thyroid gland is unremarkable. No axillary lymphadenopathy is seen.  No acute osseous abnormalities are identified.  CT ABDOMEN AND PELVIS FINDINGS  A few hypodensities are noted within the liver, measuring up to 2.1 cm in size. These are relatively stable from the prior  study and likely benign. A small calcified granuloma is noted near the hepatic hilum. The spleen is unremarkable in appearance. The patient is status post cholecystectomy, with clips noted at the gallbladder fossa. The pancreas and adrenal glands are unremarkable.  The previously noted peritoneal metastasis at the right lower quadrant has increased in size, now measuring approximately 2.2 x 1.9 cm. This obscures the right ureter at this level, with resultant dilatation of the right ureter and relatively severe right-sided hydronephrosis.  A nonobstructing 6 mm stone is noted within the dilated right ureter, proximal to the mass. There is severe left-sided renal atrophy, with several stones noted at the left renal pelvis, measuring up to 8 mm in size.  No free fluid is identified. Postoperative change is noted about the proximal small bowel. The visualized small bowel is otherwise unremarkable. The stomach is within normal limits. No acute vascular abnormalities are seen. The IVC filter is noted in expected position. Scattered calcification is seen along the abdominal aorta and its branches.   The patient is status post appendectomy. Scattered diverticulosis is noted along the descending and sigmoid colon, without evidence of diverticulitis. Contrast progresses to the level of the rectum. Postoperative change is noted at the proximal sigmoid colon.  The bladder is mildly distended. A prominent bladder calculus is again noted. There is nodular extension of the prostate at the base of the bladder, relatively stable from prior studies. The prostate is enlarged, measuring 5.5 cm in transverse dimension. No inguinal lymphadenopathy is seen.  No acute osseous abnormalities are identified. Facet disease is noted at the lower lumbar spine.  IMPRESSION: 1. Small to moderate right and small left pleural effusions seen. Patchy bilateral airspace opacification, with underlying interstitial prominence, compatible with pulmonary edema. 2. Peritoneal metastasis at the right lower quadrant has increased in size, now measuring 2.2 x 1.9 cm, versus 1.6 x 1.0 cm on the prior study. This now obscures the right ureter at this level, with resultant dilatation of the right ureter and relatively severe right-sided hydronephrosis. Given the patient's left renal atrophy, this would explain the patient's acute renal failure. 3. Nonobstructing 6 mm stone within the dilated right ureter, proximal to the mass. 4. Severe left-sided renal atrophy, with several stones at the left renal pelvis. 5. Mildly enlarged right paratracheal node measures 1.4 cm in short axis; this is nonspecific in nature. It appears increased in size from 2014. 6. Scattered diverticulosis along the descending and sigmoid colon, without evidence of diverticulitis. 7. Nodular extension of the prostate at the base of the bladder, stable from prior studies. Enlarged prostate seen. 8. Prominent bladder calculus again noted. 9. Diffuse coronary artery calcifications seen. 10. A few hypodensities within the liver, measuring up to 2.1 cm in size, appear relatively stable  and may reflect cysts. 11. Scattered calcification along the abdominal aorta and its branches.  These results were called by telephone at the time of interpretation on 10/02/2013 at 5:38 am to [facility] on [employer], who verbally acknowledged these results.   Electronically Signed   By: Garald Balding M.D.   On: 10/02/2013 05:41    Scheduled Meds: . azithromycin  500 mg Intravenous Q24H  . cefTRIAXone (ROCEPHIN)  IV  1 g Intravenous Q24H  . sodium chloride  3 mL Intravenous Q12H   Continuous Infusions: . 0.9 % NaCl with KCl 20 mEq / L 50 mL/hr at 10/02/13 2059    Active Problems:   Chest pain   CAP (community acquired pneumonia)  AKI (acute kidney injury)   Cardiomyopathy  Time spent: 83min  [facility], [facility] Pager [phone]. If 7PM-7AM, please contact night-coverage at [email or web address], password [facility] 10/03/2013, 11:13 AM  LOS: 2 days

## 2013-10-03 NOTE — Progress Notes (Signed)
Patient ID: Justin Lang., male   DOB: [birth year], 80 y.o.   MRN: [id number]   Subjective: Patient is more confused this morning. Wife is present in the room. Family did decide to pursue drainage of the right kidney with percutaneous nephrostomy tube. This procedure is currently on hold as the patient remains hypercoagulable. INR this morning still 4.9. Creatinine is stable at 5.3  Objective: Vital signs in last 24 hours: Temp:  [97.4 F (36.3 C)-98.2 F (36.8 C)] 97.8 F (36.6 C) (09/19 0450) Pulse Rate:  [73-100] 100 (09/19 0450) Resp:  [18-20] 18 (09/19 0450) BP: (149-197)/(89-107) 155/94 mmHg (09/19 0450) SpO2:  [95 %-99 %] 99 % (09/19 0450) Weight:  [76.2 kg (167 lb 15.9 oz)] 76.2 kg (167 lb 15.9 oz) (09/19 0450)  Intake/Output from previous day: 09/18 0701 - 09/19 0700 In: 705 [P.O.:120; I.V.:585] Out: 325 [Urine:325] Intake/Output this shift: Total I/O In: -  Out: 101 [Urine:100; Stool:1]  Physical Exam:  Constitutional: Vital signs reviewed. WD WN in NAD  oriented x1 Eyes: PERRL, No scleral icterus.   Cardiovascular: RRR Pulmonary/Chest: Normal effort Abdominal: Soft. Non-tender, non-distended, bowel sounds are normal, no masses, organomegaly, or guarding present.  Genitourinary: No change Extremities: No cyanosis or edema   Lab Results:  Recent Labs  10/01/13 2315 10/02/13 0455 10/03/13 0403  HGB 12.5* 12.3* 13.1  HCT 36.6* 36.1* 38.9*   BMET  Recent Labs  10/02/13 0455 10/03/13 0403  NA 142 143  K 3.1* 3.2*  CL 102 105  CO2 21 19  GLUCOSE 101* 124*  BUN 45* 45*  CREATININE 5.32* 5.37*  CALCIUM 8.1* 8.5    Recent Labs  10/01/13 1845 10/02/13 0455 10/03/13 0403  INR 4.16* 4.68* 4.90*   No results found for this basename: LABURIN,  in the last 72 hours Results for orders placed during the hospital encounter of 11/23/11  SURGICAL PCR SCREEN     Status: Abnormal   Collection Time    11/23/11  8:30 AM      Result Value Ref Range Status    MRSA, PCR NEGATIVE  NEGATIVE Final   Staphylococcus aureus POSITIVE (*) NEGATIVE Final   Comment:            The Xpert SA Assay (FDA     approved for NASAL specimens     in patients over 21 years of age),     is one component of     a comprehensive surveillance     program.  Test performance has     been validated by [employer] for patients greater     than or equal to 57 year old.     It is not intended     to diagnose infection nor to     guide or monitor treatment.     RCRV     TICKLE,S AT 1115 BY HUFFINES,S ON 11/23/11    Studies/Results: Ct Abdomen Pelvis Wo Contrast  10/02/2013   CLINICAL DATA:  Diffuse abdominal pain and chest pain. Assess for malignancy.  EXAM: CT CHEST, ABDOMEN AND PELVIS WITHOUT CONTRAST  TECHNIQUE: Multidetector CT imaging of the chest, abdomen and pelvis was performed following the standard protocol without IV contrast.  COMPARISON:  CT of the abdomen and pelvis performed 03/16/2013, and CT of the chest performed 08/08/2012  FINDINGS: CT CHEST FINDINGS  Small to moderate right and small left pleural effusions are seen. Patchy bilateral airspace opacities are noted, with underlying interstitial prominence, concerning  for pulmonary edema. No pneumothorax is seen. No masses are identified, though evaluation is limited given airspace opacification.  Diffuse coronary artery calcifications are seen. A mildly enlarged right paratracheal node is seen, measuring 1.4 cm in short axis. No definite hilar lymphadenopathy is appreciated. The great vessels are grossly unremarkable in appearance. No pericardial effusion is identified. A pacemaker lead is ending at the right ventricle; the pacemaker is seen at the anterior left chest wall.  The thyroid gland is unremarkable. No axillary lymphadenopathy is seen.  No acute osseous abnormalities are identified.  CT ABDOMEN AND PELVIS FINDINGS  A few hypodensities are noted within the liver, measuring up to 2.1 cm in size. These  are relatively stable from the prior study and likely benign. A small calcified granuloma is noted near the hepatic hilum. The spleen is unremarkable in appearance. The patient is status post cholecystectomy, with clips noted at the gallbladder fossa. The pancreas and adrenal glands are unremarkable.  The previously noted peritoneal metastasis at the right lower quadrant has increased in size, now measuring approximately 2.2 x 1.9 cm. This obscures the right ureter at this level, with resultant dilatation of the right ureter and relatively severe right-sided hydronephrosis.  A nonobstructing 6 mm stone is noted within the dilated right ureter, proximal to the mass. There is severe left-sided renal atrophy, with several stones noted at the left renal pelvis, measuring up to 8 mm in size.  No free fluid is identified. Postoperative change is noted about the proximal small bowel. The visualized small bowel is otherwise unremarkable. The stomach is within normal limits. No acute vascular abnormalities are seen. The IVC filter is noted in expected position. Scattered calcification is seen along the abdominal aorta and its branches.  The patient is status post appendectomy. Scattered diverticulosis is noted along the descending and sigmoid colon, without evidence of diverticulitis. Contrast progresses to the level of the rectum. Postoperative change is noted at the proximal sigmoid colon.  The bladder is mildly distended. A prominent bladder calculus is again noted. There is nodular extension of the prostate at the base of the bladder, relatively stable from prior studies. The prostate is enlarged, measuring 5.5 cm in transverse dimension. No inguinal lymphadenopathy is seen.  No acute osseous abnormalities are identified. Facet disease is noted at the lower lumbar spine.  IMPRESSION: 1. Small to moderate right and small left pleural effusions seen. Patchy bilateral airspace opacification, with underlying interstitial  prominence, compatible with pulmonary edema. 2. Peritoneal metastasis at the right lower quadrant has increased in size, now measuring 2.2 x 1.9 cm, versus 1.6 x 1.0 cm on the prior study. This now obscures the right ureter at this level, with resultant dilatation of the right ureter and relatively severe right-sided hydronephrosis. Given the patient's left renal atrophy, this would explain the patient's acute renal failure. 3. Nonobstructing 6 mm stone within the dilated right ureter, proximal to the mass. 4. Severe left-sided renal atrophy, with several stones at the left renal pelvis. 5. Mildly enlarged right paratracheal node measures 1.4 cm in short axis; this is nonspecific in nature. It appears increased in size from 2014. 6. Scattered diverticulosis along the descending and sigmoid colon, without evidence of diverticulitis. 7. Nodular extension of the prostate at the base of the bladder, stable from prior studies. Enlarged prostate seen. 8. Prominent bladder calculus again noted. 9. Diffuse coronary artery calcifications seen. 10. A few hypodensities within the liver, measuring up to 2.1 cm in size, appear relatively stable and  may reflect cysts. 11. Scattered calcification along the abdominal aorta and its branches.  These results were called by telephone at the time of interpretation on 10/02/2013 at 5:38 am to [facility] on [facility] who verbally acknowledged these results.   Electronically Signed   By: Garald Balding M.D.   On: 10/02/2013 05:41   Dg Chest 2 View  10/01/2013   CLINICAL DATA:  Chest pain and shortness of breath  EXAM: CHEST  2 VIEW  COMPARISON:  08/20/2012 and prior radiographs  FINDINGS: Cardiomegaly and left-sided pacemaker again noted.  Left lower lobe airspace disease/ consolidation likely represents pneumonia.  There is no evidence of pleural effusion, pneumothorax or pulmonary edema.  No acute bony abnormalities are noted.  IMPRESSION: Left lower lobe airspace disease/consolidation  likely representing pneumonia. Radiographic follow-up to resolution is recommended.  Cardiomegaly.   Electronically Signed   By: Hassan Rowan M.D.   On: 10/01/2013 20:55   Ct Chest Wo Contrast  10/02/2013   CLINICAL DATA:  Diffuse abdominal pain and chest pain. Assess for malignancy.  EXAM: CT CHEST, ABDOMEN AND PELVIS WITHOUT CONTRAST  TECHNIQUE: Multidetector CT imaging of the chest, abdomen and pelvis was performed following the standard protocol without IV contrast.  COMPARISON:  CT of the abdomen and pelvis performed 03/16/2013, and CT of the chest performed 08/08/2012  FINDINGS: CT CHEST FINDINGS  Small to moderate right and small left pleural effusions are seen. Patchy bilateral airspace opacities are noted, with underlying interstitial prominence, concerning for pulmonary edema. No pneumothorax is seen. No masses are identified, though evaluation is limited given airspace opacification.  Diffuse coronary artery calcifications are seen. A mildly enlarged right paratracheal node is seen, measuring 1.4 cm in short axis. No definite hilar lymphadenopathy is appreciated. The great vessels are grossly unremarkable in appearance. No pericardial effusion is identified. A pacemaker lead is ending at the right ventricle; the pacemaker is seen at the anterior left chest wall.  The thyroid gland is unremarkable. No axillary lymphadenopathy is seen.  No acute osseous abnormalities are identified.  CT ABDOMEN AND PELVIS FINDINGS  A few hypodensities are noted within the liver, measuring up to 2.1 cm in size. These are relatively stable from the prior study and likely benign. A small calcified granuloma is noted near the hepatic hilum. The spleen is unremarkable in appearance. The patient is status post cholecystectomy, with clips noted at the gallbladder fossa. The pancreas and adrenal glands are unremarkable.  The previously noted peritoneal metastasis at the right lower quadrant has increased in size, now measuring  approximately 2.2 x 1.9 cm. This obscures the right ureter at this level, with resultant dilatation of the right ureter and relatively severe right-sided hydronephrosis.  A nonobstructing 6 mm stone is noted within the dilated right ureter, proximal to the mass. There is severe left-sided renal atrophy, with several stones noted at the left renal pelvis, measuring up to 8 mm in size.  No free fluid is identified. Postoperative change is noted about the proximal small bowel. The visualized small bowel is otherwise unremarkable. The stomach is within normal limits. No acute vascular abnormalities are seen. The IVC filter is noted in expected position. Scattered calcification is seen along the abdominal aorta and its branches.  The patient is status post appendectomy. Scattered diverticulosis is noted along the descending and sigmoid colon, without evidence of diverticulitis. Contrast progresses to the level of the rectum. Postoperative change is noted at the proximal sigmoid colon.  The bladder is mildly distended.  A prominent bladder calculus is again noted. There is nodular extension of the prostate at the base of the bladder, relatively stable from prior studies. The prostate is enlarged, measuring 5.5 cm in transverse dimension. No inguinal lymphadenopathy is seen.  No acute osseous abnormalities are identified. Facet disease is noted at the lower lumbar spine.  IMPRESSION: 1. Small to moderate right and small left pleural effusions seen. Patchy bilateral airspace opacification, with underlying interstitial prominence, compatible with pulmonary edema. 2. Peritoneal metastasis at the right lower quadrant has increased in size, now measuring 2.2 x 1.9 cm, versus 1.6 x 1.0 cm on the prior study. This now obscures the right ureter at this level, with resultant dilatation of the right ureter and relatively severe right-sided hydronephrosis. Given the patient's left renal atrophy, this would explain the patient's acute  renal failure. 3. Nonobstructing 6 mm stone within the dilated right ureter, proximal to the mass. 4. Severe left-sided renal atrophy, with several stones at the left renal pelvis. 5. Mildly enlarged right paratracheal node measures 1.4 cm in short axis; this is nonspecific in nature. It appears increased in size from 2014. 6. Scattered diverticulosis along the descending and sigmoid colon, without evidence of diverticulitis. 7. Nodular extension of the prostate at the base of the bladder, stable from prior studies. Enlarged prostate seen. 8. Prominent bladder calculus again noted. 9. Diffuse coronary artery calcifications seen. 10. A few hypodensities within the liver, measuring up to 2.1 cm in size, appear relatively stable and may reflect cysts. 11. Scattered calcification along the abdominal aorta and its branches.  These results were called by telephone at the time of interpretation on 10/02/2013 at 5:38 am to [facility] on [facility], who verbally acknowledged these results.   Electronically Signed   By: Garald Balding M.D.   On: 10/02/2013 05:41    Assessment/Plan:   Obstructed right renal unit secondary to metastatic colorectal cancer. The patient has had a indolent course with regard to his colon cancer. Family desires ongoing active treatment.  The left renal unit is atrophic and probably poorly functioning. Nephrostomy drainage would be the preferred method of assuring good ongoing drainage of the right kidney. Double-J stent squarely function well in the face of extrinsic compression from malignancy. INR will need to be allowed to normalize or gently reversed. It appears that nephrostomy tube placement we'll not realistically be performed today and will probably be done on Sunday or Monday. Given the stable creatinine and lack of hyperkalemia there is not an emergent need to place the nephrostomy tube.   LOS: 2 days   Medina Degraffenreid S 10/03/2013, 8:45 AM

## 2013-10-03 NOTE — Progress Notes (Signed)
The patient has not had the posey belt restraint on since I came on the shift.  The patient is calm and oriented.  A family member will be at the bedside with the patient overnight.  Will D/C the restraint order.

## 2013-10-03 NOTE — Progress Notes (Signed)
The patient had a 27 beat run of wide QRS tachycardia at 1804.  An EKG was completed and VS were taken and were charted.  Fredirick Maudlin was notified for the day shift nurse.  New orders were given for a magnesium level and to notify the NP if it was less than 2.0.  The RN will carry out the orders.

## 2013-10-04 DIAGNOSIS — R030 Elevated blood-pressure reading, without diagnosis of hypertension: Secondary | ICD-10-CM

## 2013-10-04 LAB — COMPREHENSIVE METABOLIC PANEL
ALK PHOS: 57 U/L (ref 39–117)
ALT: 5 U/L (ref 0–53)
AST: 17 U/L (ref 0–37)
Albumin: 2.7 g/dL — ABNORMAL LOW (ref 3.5–5.2)
Anion gap: 15 (ref 5–15)
BUN: 45 mg/dL — AB (ref 6–23)
CO2: 22 meq/L (ref 19–32)
Calcium: 8.6 mg/dL (ref 8.4–10.5)
Chloride: 109 mEq/L (ref 96–112)
Creatinine, Ser: 5.49 mg/dL — ABNORMAL HIGH (ref 0.50–1.35)
GFR, EST AFRICAN AMERICAN: 10 mL/min — AB (ref >90)
GFR, EST NON AFRICAN AMERICAN: 8 mL/min — AB (ref >90)
GLUCOSE: 96 mg/dL (ref 70–99)
POTASSIUM: 3.5 meq/L — AB (ref 3.7–5.3)
Sodium: 146 mEq/L (ref 137–147)
Total Bilirubin: 0.4 mg/dL (ref 0.3–1.2)
Total Protein: 6.1 g/dL (ref 6.0–8.3)

## 2013-10-04 LAB — CBC WITH DIFFERENTIAL/PLATELET
BASOS ABS: 0 10*3/uL (ref 0.0–0.1)
BASOS PCT: 0 % (ref 0–1)
EOS ABS: 0.7 10*3/uL (ref 0.0–0.7)
Eosinophils Relative: 9 % — ABNORMAL HIGH (ref 0–5)
HCT: 35.2 % — ABNORMAL LOW (ref 39.0–52.0)
Hemoglobin: 11.8 g/dL — ABNORMAL LOW (ref 13.0–17.0)
Lymphocytes Relative: 18 % (ref 12–46)
Lymphs Abs: 1.4 10*3/uL (ref 0.7–4.0)
MCH: 30.3 pg (ref 26.0–34.0)
MCHC: 33.5 g/dL (ref 30.0–36.0)
MCV: 90.3 fL (ref 78.0–100.0)
Monocytes Absolute: 0.6 10*3/uL (ref 0.1–1.0)
Monocytes Relative: 8 % (ref 3–12)
NEUTROS ABS: 5 10*3/uL (ref 1.7–7.7)
NEUTROS PCT: 65 % (ref 43–77)
PLATELETS: 212 10*3/uL (ref 150–400)
RBC: 3.9 MIL/uL — ABNORMAL LOW (ref 4.22–5.81)
RDW: 15.1 % (ref 11.5–15.5)
WBC: 7.7 10*3/uL (ref 4.0–10.5)

## 2013-10-04 LAB — PROTIME-INR
INR: 4.68 — AB (ref 0.00–1.49)
PROTHROMBIN TIME: 44.1 s — AB (ref 11.6–15.2)

## 2013-10-04 MED ORDER — METOPROLOL TARTRATE 25 MG PO TABS
25.0000 mg | ORAL_TABLET | Freq: Two times a day (BID) | ORAL | Status: DC
  Administered 2013-10-04 – 2013-10-08 (×8): 25 mg via ORAL
  Filled 2013-10-04 (×9): qty 1

## 2013-10-04 MED ORDER — MAGNESIUM SULFATE IN D5W 10-5 MG/ML-% IV SOLN
1.0000 g | Freq: Once | INTRAVENOUS | Status: AC
  Administered 2013-10-04: 1 g via INTRAVENOUS
  Filled 2013-10-04: qty 100

## 2013-10-04 NOTE — Progress Notes (Addendum)
[facility] PROGRESS NOTE  Baxter Kail. XQJ:[id number] DOB: Jan 23, 1922 DOA: 10/01/2013 PCP: Delphina Cahill, MD  Assessment/Plan: 1. Chest pain 1. Serial trop neg 2. 2d echo with no WMA but with 44-81% EF with diastolic dysfunction (see below) 2. 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 Pt continues to be compensated at this time 2. Presenting BNP was markedly elevated, but question if renal failure is contributing 3. Given markedly low EF of 20-25%, would cont to avoid aggressive volume load - including blood products if we can avoid them (see below) 4. TSH normal 3. CAP 1. Afebrile 2. Tolerating rocephin and azithrymycin 3. No leukocytosis 4. AKI 1. Cr worsened slightly overnight 2. CT abd with enlarged metastatic lesion that appears to be resulting in R ureteral obstruction and hydroureter 3. Appreciate Urology input, with recs for PCN when INR reverses 4. INR still too supertherapeutic for intervention (see below) 5. Metastatic colon ca 1. Followed by Oncology as outpatient 6. Hx DVT 1. Pt with hypercoagulable state, is s/p IVC filter placement 2. INR supertherapeutic 3. No signs of active bleed 4. Coumadin remains on hold 5. Given EF of 20%, would avoid volume load at this time in the form of blood products such as FFP 6. Will continue conservative approach for now and monitor INR closely 7. If need to more aggressive reversal of INR, would give 5mg  vit K 7. Coumadin coagulopathy 1.  Per above 8. Wide complex tachycardia 1. Noted last night 2. Pt noted to be asymptomatic during this episode 3. Per staff, tele lead had become misplaced during this episode. 4. Given above dx of EF of 20%, consider discussing with Cardiology 5. Pt currently with pacemaker, not ICD per family 59. Start beta blocker as tolerated  Code Status: DNR Family Communication: Pt and wife and son in room Disposition Plan:  Pending   Consultants:  Urology  Procedures:    Antibiotics:  Rocephin 9/17>>>  Azithromycin 9/17>>>  HPI/Subjective: Confused overnight.  Objective: Filed Vitals:   10/03/13 1804 10/03/13 2101 10/04/13 0652 10/04/13 1447  BP: 168/76 163/92 134/78 145/85  Pulse: 74 76 72 73  Temp: 97 F (36.1 C) 97.1 F (36.2 C) 98 F (36.7 C) 97.8 F (36.6 C)  TempSrc: Oral Oral Oral Oral  Resp: 18 17 17 17   Height:      Weight:   76.2 kg (167 lb 15.9 oz)   SpO2: 96% 100% 95% 100%    Intake/Output Summary (Last 24 hours) at 10/04/13 1640 Last data filed at 10/04/13 1511  Gross per 24 hour  Intake 3083.67 ml  Output    575 ml  Net 2508.67 ml   Filed Weights   10/02/13 0046 10/03/13 0450 10/04/13 0652  Weight: 75.7 kg (166 lb 14.2 oz) 76.2 kg (167 lb 15.9 oz) 76.2 kg (167 lb 15.9 oz)    Exam:   General:  Awake, in nad  Cardiovascular: regular, s1, s2  Respiratory: normal resp effort, no wheezing  Abdomen: soft,nondistended  Musculoskeletal: perfused, no clubbing   Data Reviewed: Basic Metabolic Panel:  Recent Labs Lab 10/01/13 1845 10/01/13 2315 10/02/13 0455 10/03/13 0403 10/03/13 2250 10/04/13 0825  NA 143  --  142 143  --  146  K 3.4*  --  3.1* 3.2*  --  3.5*  CL 103  --  102 105  --  109  CO2 23  --  21 19  --  22  GLUCOSE 150*  --  101* 124*  --  96  BUN 43*  --  45* 45*  --  45*  CREATININE 5.37* 5.14* 5.32* 5.37*  --  5.49*  CALCIUM 8.5  --  8.1* 8.5  --  8.6  MG  --   --   --   --  1.8  --    Liver Function Tests:  Recent Labs Lab 10/01/13 1845 10/02/13 0455 10/03/13 0403 10/04/13 0825  AST 13 22 14 17   ALT 8 5 <5 5  ALKPHOS 69 61 64 57  BILITOT 0.4 0.4 0.4 0.4  PROT 7.3 6.3 6.8 6.1  ALBUMIN 3.1* 2.7* 2.9* 2.7*   No results found for this basename: LIPASE, AMYLASE,  in the last 168 hours No results found for this basename: AMMONIA,  in the last 168 hours CBC:  Recent Labs Lab 10/01/13 1845 10/01/13 2315 10/02/13 0455  10/03/13 0403 10/04/13 0825  WBC 8.4 8.1 7.1 9.4 7.7  NEUTROABS  --   --  4.4 7.5 5.0  HGB 13.4 12.5* 12.3* 13.1 11.8*  HCT 39.9 36.6* 36.1* 38.9* 35.2*  MCV 88.1 87.4 87.0 87.8 90.3  PLT 249 245 225 257 212   Cardiac Enzymes:  Recent Labs Lab 10/01/13 2315 10/02/13 0455 10/02/13 0955  TROPONINI <0.30 <0.30 <0.30   BNP (last 3 results)  Recent Labs  10/01/13 1845  PROBNP 17357.0*   CBG: No results found for this basename: GLUCAP,  in the last 168 hours  Recent Results (from the past 240 hour(s))  CULTURE, BLOOD (ROUTINE X 2)     Status: None   Collection Time    10/01/13 11:08 PM      Result Value Ref Range Status   Specimen Description BLOOD ARM LEFT   Final   Special Requests     Final   Value: BOTTLES DRAWN AEROBIC AND ANAEROBIC 10CC AZITHROMYCIN, CEFTRIAXONE   Culture  Setup Time     Final   Value: 10/02/2013 05:41     Performed at [facility]   Culture     Final   Value:        BLOOD CULTURE RECEIVED NO GROWTH TO DATE CULTURE WILL BE HELD FOR 5 DAYS BEFORE ISSUING A FINAL NEGATIVE REPORT     Performed at [facility]   Report Status PENDING   Incomplete  CULTURE, BLOOD (ROUTINE X 2)     Status: None   Collection Time    10/01/13 11:15 PM      Result Value Ref Range Status   Specimen Description BLOOD HAND LEFT   Final   Special Requests     Final   Value: BOTTLES DRAWN AEROBIC ONLY 10CC AZITHROMYCIN, CEFTRIAXONE   Culture  Setup Time     Final   Value: 10/02/2013 05:40     Performed at [facility]   Culture     Final   Value:        BLOOD CULTURE RECEIVED NO GROWTH TO DATE CULTURE WILL BE HELD FOR 5 DAYS BEFORE ISSUING A FINAL NEGATIVE REPORT     Performed at [facility]   Report Status PENDING   Incomplete  URINE CULTURE     Status: None   Collection Time    10/02/13  7:59 AM      Result Value Ref Range Status   Specimen Description URINE, RANDOM   Final   Special Requests NONE   Final   Culture  Setup Time      Final   Value:  10/02/2013 14:34     Performed at [facility]     Final   Value: 6,000 COLONIES/ML     Performed at [facility]   Culture     Final   Value: INSIGNIFICANT GROWTH     Performed at [facility]   Report Status 10/03/2013 FINAL   Final     Studies: No results found.  Scheduled Meds: . azithromycin  500 mg Intravenous Q24H  . cefTRIAXone (ROCEPHIN)  IV  1 g Intravenous Q24H  . sodium chloride  3 mL Intravenous Q12H   Continuous Infusions: . 0.9 % NaCl with KCl 20 mEq / L 50 mL/hr at 10/03/13 1841    Active Problems:   Chest pain   CAP (community acquired pneumonia)   AKI (acute kidney injury)   Cardiomyopathy  Time spent: 54min  Melenie Minniear, [facility] Pager [phone]. If 7PM-7AM, please contact night-coverage at [email or web address], password [facility] 10/04/2013, 4:40 PM  LOS: 3 days

## 2013-10-04 NOTE — Progress Notes (Signed)
ANTICOAGULATION CONSULT NOTE - Initial Consult  Pharmacy Consult for Warfarin  Indication: hx DVT  Allergies  Allergen Reactions  . Vectibix [Panitumumab] Other (See Comments)    On 08/20/12 during Vectibix infusion at [facility] developed difficulty swallowing, unable answer questions due to lips/throat feeling "funny", confusion, with no affect. Has difficulty getting words out.  . Celecoxib Rash    CELEBREX    Patient Measurements: Height: 5\' 8"  (172.7 cm) Weight: 167 lb 15.9 oz (76.2 kg) IBW/kg (Calculated) : 68.4  Vital Signs: Temp: 98 F (36.7 C) (09/20 0652) Temp src: Oral (09/20 0652) BP: 134/78 mmHg (09/20 0652) Pulse Rate: 72 (09/20 0652)  Labs:  Recent Labs  10/01/13 1845 10/01/13 2315 10/02/13 0455 10/02/13 0955 10/03/13 0403 10/04/13 0825  HGB 13.4 12.5* 12.3*  --  13.1 11.8*  HCT 39.9 36.6* 36.1*  --  38.9* 35.2*  PLT 249 245 225  --  257 212  APTT 73*  --   --   --   --   --   LABPROT 40.2*  --  44.1*  --  45.7* 44.1*  INR 4.16*  --  4.68*  --  4.90* 4.68*  CREATININE 5.37* 5.14* 5.32*  --  5.37* 5.49*  TROPONINI  --  <0.30 <0.30 <0.30  --   --    Assessment: Pt on warfarin for hx DVT. INR remains SUPRA-therapeutic 4.68 this morning, coumadin has been on hold since admission. CBC stable and wnl. Per urology note, might require nephrostomy tube placement Sunday or Monday, will need INR to normalize before procedure, so might require vitamin K to reverse coumadin effect. No vitamin K yet.  PTA doses: 5mg  daily except for 7mg  on Tues, Thurs per coumadin clinic note in August  Goal of Therapy:  INR 2-3 Monitor platelets by anticoagulation protocol: Yes   Plan:  -Continue HOLD warfarin for now -Daily PT/INR -If reversal is needed, recommend vitamin K 5mg  IV or PO x 1 -Re-start warfarin when indicated   Maryanna Shape, PharmD, BCPS  Clinical Pharmacist  Pager: [phone]   10/04/2013,12:44 PM

## 2013-10-04 NOTE — Progress Notes (Signed)
The patient's magnesium level came back at 1.8.  Justin Lang was notified.  New orders were given to infuse IV magnesium.  The RN carried out the orders.

## 2013-10-04 NOTE — Progress Notes (Signed)
Referring Physician: Marylu Lund, MD Primary Physician:  Primary Cardiologist: Cristopher Peru, MD Reason for Consultation: New onset heart failure with reduced ejection fraction, NSVT   HPI: [name] is a 80 y.o. M with metastatic colon cancer, sinus node dysfuntion s/p MDT PPM, DVT on warfarin admitted with pneumonia and AKI incidentally found to have a newly reduced ejection fraction.  [name] was initially admitted with chest pain and SOB.  He was found to have pneumonia and AKI due to obstruction of the R ureter by peritoneal masses.  During this work up a TTE was ordered that demonstrated an LVEF of 20-25% with global hypokinesis and grade 2 diastolic dysfunction. When last checked in 2010, LVEF was wnl.  [name] denies chest pain/pressure, orthopnea, PND, palpitations, lightheadedness or dizziness.  His ambulation is mostly limited by generalized weakness.  In the last several weeks he does note increaxed SOB and decreased exercise tolerance.  He reports chronic LE edema that has been unchanged x30 years.  During this hospitalization [name] was found to have a 20 beat run of NSVT that was asymptomatic. Given his newly diagnosed depressed LVEF and NSVT on telemtry, Cardiology was asked to consult.  Of note, his PPM interrogation on 04/30/13 reported 10 beats of NSVT.   Review of Systems:     Cardiac Review of Systems: {Y] = yes [ ]  = no  Chest Pain [    ]  Resting SOB [ Y  ] Exertional SOB  [  Y]  Orthopnea [  ]   Pedal Edema [ Y  ]    Palpitations [  ] Syncope  [  ]   Presyncope [   ]  General Review of Systems: [Y] = yes [  ]=no Constitional: recent weight change [  ]; anorexia [Y  ]; fatigue [  ]; nausea [  ]; night sweats [  ]; fever [  ]; or chills [  ];                                                                     Eyes : blurred vision [  ]; diplopia [   ]; vision changes [  ];  Amaurosis fugax[  ]; Resp: cough [  ];  wheezing[  ];  hemoptysis[  ];  PND [  ];  GI:   gallstones[  ], vomiting[  ];  dysphagia[  ]; melena[  ];  hematochezia [  ]; heartburn[  ];   GU: kidney stones [  ]; hematuria[  ];   dysuria [  ];  nocturia[  ]; incontinence [  ];             Skin: rash, swelling[  ];, hair loss[  ];  peripheral edema[  ];  or itching[  ]; Musculosketetal: myalgias[  ];  joint swelling[  ];  joint erythema[  ];  joint pain[  ];  back pain[  ];  Heme/Lymph: bruising[  ];  bleeding[  ];  anemia[  ];  Neuro: TIA[  ];  headaches[  ];  stroke[  ];  vertigo[  ];  seizures[  ];   paresthesias[  ];  difficulty walking[  ];  Psych:depression[  ]; anxiety[  ];  Endocrine: diabetes[  ];  thyroid dysfunction[  ];  Other:  Past Medical History  Diagnosis Date  . DVT (deep venous thrombosis)     hx of s/p [place]field filter  . OAB (overactive bladder)   . Arthritis   . Cataracts, bilateral   . FH: factor V Leiden deficiency   . History of nephrolithiasis   . History of ankle fracture   . 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  . Peripheral neuropathy   . Vitamin B12 deficiency   . Symptomatic bradycardia   . Sinoatrial node dysfunction   . Sinoatrial node dysfunction   . Pacemaker 4 yrs ago  . Colon cancer 07/23/00    hx of metastic- treated with surgery and readiation  . Allergy     celebrex  . History of radiation therapy 09/02/00-10/07/00    recurrent colon cancer/left periaortic nodal recurrence=4500cGy/25 fxs,Dr.Wu  . Hx SBO     multiple intermittent and surgery  . History of chemotherapy     Medications Prior to Admission  Medication Sig Dispense Refill  . cholecalciferol (VITAMIN D) 1000 UNITS tablet Take 2,000 Units by mouth at bedtime.       . diphenoxylate-atropine (LOMOTIL) 2.5-0.025 MG per tablet Take 1 tablet by mouth 4 (four) times daily as needed for diarrhea or loose stools.       . mupirocin ointment (BACTROBAN) 2 % Apply 1 application topically 2 (two) times daily.       Marland Kitchen warfarin (COUMADIN) 2 MG tablet Take 1 tablet (2  mg total) by mouth as directed. Taken with 5mg  tablet on Mon, [place] & Fridays.  30 tablet  1  . warfarin (COUMADIN) 5 MG tablet Take 5 mg by mouth daily except 7 mg on Tues/Thurs or as directed.  30 tablet  3     . azithromycin  500 mg Intravenous Q24H  . cefTRIAXone (ROCEPHIN)  IV  1 g Intravenous Q24H  . metoprolol tartrate  25 mg Oral BID  . sodium chloride  3 mL Intravenous Q12H    Infusions: . 0.9 % NaCl with KCl 20 mEq / L 50 mL/hr at 10/04/13 1725    Allergies  Allergen Reactions  . Vectibix [Panitumumab] Other (See Comments)    On 08/20/12 during Vectibix infusion at [facility] developed difficulty swallowing, unable answer questions due to lips/throat feeling "funny", confusion, with no affect. Has difficulty getting words out.  . Celecoxib Rash    CELEBREX    History   Social History  . Marital Status: Married    Spouse Name: N/A    Number of Children: 6  . Years of Education: N/A   Occupational History  . Not on file.   Social History Main Topics  . Smoking status: Never Smoker   . Smokeless tobacco: Never Used  . Alcohol Use: No  . Drug Use: No  . Sexual Activity: No   Other Topics Concern  . Not on file   Social History Narrative   Married, lives with spouse.     Family History  Problem Relation Age of Onset  . Anesthesia problems Neg Hx   . Hypotension Neg Hx   . Malignant hyperthermia Neg Hx   . Pseudochol deficiency Neg Hx   . Cancer Sister     PHYSICAL EXAM: Filed Vitals:   10/04/13 1447  BP: 145/85  Pulse: 73  Temp: 97.8 F (36.6 C)  Resp: 17     Intake/Output Summary (Last 24 hours) at  10/04/13 1747 Last data filed at 10/04/13 1511  Gross per 24 hour  Intake 3083.67 ml  Output    575 ml  Net 2508.67 ml    General:  Well appearing.  Lying flat in the bed in no respiratory distress. HEENT: normal Neck: supple. no JVD. Carotids 2+ bilat; no bruits.No cervical LAD. Cor:  Regular rate & rhythm. No rubs, gallops or murmurs. Lungs:  CTAB.  No crackles, rhonchi or wheezes. Abdomen: soft, nontender, nondistended. No hepatosplenomegaly. No bruits or masses. Good bowel sounds. Extremities: no cyanosis, clubbing, rash, edema Neuro: alert & oriented x 3, cranial nerves grossly intact. moves all 4 extremities w/o difficulty. Affect pleasant.  ECG: Sinus at 75bpm.  First degree AV block.  PVC, PAC.  LVH with early repolarization abnormality.  Results for orders placed during the hospital encounter of 10/01/13 (from the past 24 hour(s))  MAGNESIUM     Status: None   Collection Time    10/03/13 10:50 PM      Result Value Ref Range   Magnesium 1.8  1.5 - 2.5 mg/dL  COMPREHENSIVE METABOLIC PANEL     Status: Abnormal   Collection Time    10/04/13  8:25 AM      Result Value Ref Range   Sodium 146  137 - 147 mEq/L   Potassium 3.5 (*) 3.7 - 5.3 mEq/L   Chloride 109  96 - 112 mEq/L   CO2 22  19 - 32 mEq/L   Glucose, Bld 96  70 - 99 mg/dL   BUN 45 (*) 6 - 23 mg/dL   Creatinine, Ser 5.49 (*) 0.50 - 1.35 mg/dL   Calcium 8.6  8.4 - 10.5 mg/dL   Total Protein 6.1  6.0 - 8.3 g/dL   Albumin 2.7 (*) 3.5 - 5.2 g/dL   AST 17  0 - 37 U/L   ALT 5  0 - 53 U/L   Alkaline Phosphatase 57  39 - 117 U/L   Total Bilirubin 0.4  0.3 - 1.2 mg/dL   GFR calc non Af Amer 8 (*) >90 mL/min   GFR calc Af Amer 10 (*) >90 mL/min   Anion gap 15  5 - 15  CBC WITH DIFFERENTIAL     Status: Abnormal   Collection Time    10/04/13  8:25 AM      Result Value Ref Range   WBC 7.7  4.0 - 10.5 K/uL   RBC 3.90 (*) 4.22 - 5.81 MIL/uL   Hemoglobin 11.8 (*) 13.0 - 17.0 g/dL   HCT 35.2 (*) 39.0 - 52.0 %   MCV 90.3  78.0 - 100.0 fL   MCH 30.3  26.0 - 34.0 pg   MCHC 33.5  30.0 - 36.0 g/dL   RDW 15.1  11.5 - 15.5 %   Platelets 212  150 - 400 K/uL   Neutrophils Relative % 65  43 - 77 %   Neutro Abs 5.0  1.7 - 7.7 K/uL   Lymphocytes Relative 18  12 - 46 %   Lymphs Abs 1.4  0.7 - 4.0 K/uL   Monocytes Relative 8  3 - 12 %   Monocytes Absolute 0.6  0.1 - 1.0 K/uL     Eosinophils Relative 9 (*) 0 - 5 %   Eosinophils Absolute 0.7  0.0 - 0.7 K/uL   Basophils Relative 0  0 - 1 %   Basophils Absolute 0.0  0.0 - 0.1 K/uL  PROTIME-INR     Status: Abnormal  Collection Time    10/04/13  8:25 AM      Result Value Ref Range   Prothrombin Time 44.1 (*) 11.6 - 15.2 seconds   INR 4.68 (*) 0.00 - 1.49     ASSESSMENT:  80 y.o. M with metastatic colon cancer, sinus node dysfuntion s/p MDT PPM, DVT on warfarin admitted with pneumonia and AKI incidentally found to have a newly reduced ejection fraction.  Clinically, [name] does not have heart failure.  The etiology is unclear.  It seems that he had 5-FU for chemotherapy, but that is unclear.  I had a long discussion with him and several family members including his wife and son.  We discussed the fact that a typical work up for new HFrEF would include heart catheterization, a period of medical optimization followed by consideration of ICD implantation in 3 months.  However, given his acute condition with AKI, need for percutaneous nephrostomy tube, pneumonia, and metastatic colon cancer, the risk of these interventions outweigh the benefit.  Therefore, I would suggest medical management of his heart failure.  We also discussed his overall goals of care.  [name] is currently listed as DNAR.  However, he articulated that he would like resuscitation efforts without prolonged interventions.  He does not want to be in a vegetative state, but would like an attempt at resuscitation if needed.  He will continue to re-evaluate his wishes with his family.   PLAN/DISCUSSION: - Agree with starting metoprolol 25mg  bid - Would consider starting ASA 81mg  daily for presumptive CAD.  - Consider starting atorvastatin 40mg  (not 80mg  given age) - Avoid ACE-I or spironolactone given AKI - Family agrees not to pursue LHC or consider ICD placement - No plans for ICD - Please keep K>4, Mg >2

## 2013-10-04 NOTE — Plan of Care (Signed)
Problem: Phase I Progression Outcomes Goal: Code status addressed with pt/family Outcome: Not Met (add Reason) DNR.  Client is being monitored and keep comfortable.

## 2013-10-05 DIAGNOSIS — I472 Ventricular tachycardia, unspecified: Secondary | ICD-10-CM

## 2013-10-05 DIAGNOSIS — Z950 Presence of cardiac pacemaker: Secondary | ICD-10-CM

## 2013-10-05 DIAGNOSIS — I4729 Other ventricular tachycardia: Secondary | ICD-10-CM

## 2013-10-05 LAB — PROTIME-INR
INR: 3.7 — ABNORMAL HIGH (ref 0.00–1.49)
PROTHROMBIN TIME: 36.7 s — AB (ref 11.6–15.2)

## 2013-10-05 LAB — CBC WITH DIFFERENTIAL/PLATELET
BASOS ABS: 0 10*3/uL (ref 0.0–0.1)
Basophils Relative: 1 % (ref 0–1)
Eosinophils Absolute: 0.8 10*3/uL — ABNORMAL HIGH (ref 0.0–0.7)
Eosinophils Relative: 10 % — ABNORMAL HIGH (ref 0–5)
HEMATOCRIT: 35.1 % — AB (ref 39.0–52.0)
Hemoglobin: 11.8 g/dL — ABNORMAL LOW (ref 13.0–17.0)
LYMPHS PCT: 14 % (ref 12–46)
Lymphs Abs: 1.1 10*3/uL (ref 0.7–4.0)
MCH: 29.9 pg (ref 26.0–34.0)
MCHC: 33.6 g/dL (ref 30.0–36.0)
MCV: 89.1 fL (ref 78.0–100.0)
MONO ABS: 0.7 10*3/uL (ref 0.1–1.0)
Monocytes Relative: 9 % (ref 3–12)
NEUTROS ABS: 5.3 10*3/uL (ref 1.7–7.7)
Neutrophils Relative %: 68 % (ref 43–77)
PLATELETS: 233 10*3/uL (ref 150–400)
RBC: 3.94 MIL/uL — AB (ref 4.22–5.81)
RDW: 15.2 % (ref 11.5–15.5)
WBC: 7.8 10*3/uL (ref 4.0–10.5)

## 2013-10-05 LAB — COMPREHENSIVE METABOLIC PANEL
ALBUMIN: 2.6 g/dL — AB (ref 3.5–5.2)
ALT: 6 U/L (ref 0–53)
AST: 16 U/L (ref 0–37)
Alkaline Phosphatase: 51 U/L (ref 39–117)
Anion gap: 15 (ref 5–15)
BUN: 42 mg/dL — AB (ref 6–23)
CALCIUM: 8.5 mg/dL (ref 8.4–10.5)
CO2: 20 mEq/L (ref 19–32)
CREATININE: 4.99 mg/dL — AB (ref 0.50–1.35)
Chloride: 109 mEq/L (ref 96–112)
GFR calc Af Amer: 11 mL/min — ABNORMAL LOW (ref >90)
GFR calc non Af Amer: 9 mL/min — ABNORMAL LOW (ref >90)
Glucose, Bld: 107 mg/dL — ABNORMAL HIGH (ref 70–99)
Potassium: 3.5 mEq/L — ABNORMAL LOW (ref 3.7–5.3)
Sodium: 144 mEq/L (ref 137–147)
TOTAL PROTEIN: 6.2 g/dL (ref 6.0–8.3)
Total Bilirubin: 0.3 mg/dL (ref 0.3–1.2)

## 2013-10-05 MED ORDER — POTASSIUM CHLORIDE CRYS ER 20 MEQ PO TBCR
40.0000 meq | EXTENDED_RELEASE_TABLET | Freq: Once | ORAL | Status: AC
  Administered 2013-10-05: 40 meq via ORAL
  Filled 2013-10-05: qty 2

## 2013-10-05 MED ORDER — ENSURE COMPLETE PO LIQD
237.0000 mL | Freq: Two times a day (BID) | ORAL | Status: DC
  Administered 2013-10-05 – 2013-10-11 (×7): 237 mL via ORAL

## 2013-10-05 MED ORDER — ONDANSETRON HCL 4 MG/2ML IJ SOLN
4.0000 mg | Freq: Four times a day (QID) | INTRAMUSCULAR | Status: DC | PRN
  Administered 2013-10-05 – 2013-10-10 (×3): 4 mg via INTRAVENOUS
  Filled 2013-10-05 (×4): qty 2

## 2013-10-05 NOTE — Progress Notes (Signed)
[facility] PROGRESS NOTE  Justin Lang. NKN:[id number] DOB: Sep 23, 1922 DOA: 10/01/2013 PCP: Delphina Cahill, MD  Assessment/Plan: 1. Chest pain 1. Serial trop were neg 2. 2d echo with no WMA but with new 37-90% EF with diastolic dysfunction (see below) 2. 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 Pt continues to be compensated at this time 2. Presenting BNP was markedly elevated, but question if renal failure is contributing to elevated BNP 3. Given markedly low EF of 20-25%, would cont to avoid aggressive volume load - including blood products if we can avoid them (see below) 4. TSH was normal 3. CAP 1. Afebrile 2. Tolerating rocephin and azithrymycin 3. No leukocytosis 4. AKI 1. Cr worsened slightly overnight 2. CT abd with enlarged metastatic lesion that appears to be resulting in R ureteral obstruction and hydroureter 3. Appreciate Urology input, with recs for PCN when INR reverses 4. INR remains supertherapeutic for intervention (see below) 5. Metastatic colon ca 1. Followed by Oncology as outpatient 6. Hx DVT 1. Pt with hypercoagulable state, is s/p IVC filter placement 2. INR supertherapeutic 3. No signs of active bleed 4. Coumadin remains on hold 5. Given EF of 20%, would avoid volume load at this time in the form of blood products such as FFP 6. Will continue conservative approach for now and monitor INR closely 7. If need to more aggressive reversal of INR, would give 5mg  vit K 7. Coumadin coagulopathy 1.  Per above 8. Wide complex tachycardia 1. Noted last night 2. Pt noted to be asymptomatic during this episode 3. Given above dx of EF of 20%, consulted Cardiology 4. Pt currently with pacemaker, not ICD per family 5. Started beta blocker as tolerated - will continue  Code Status: DNR Family Communication: Pt and wife in room Disposition Plan: Pending   Consultants:  Urology  Procedures:    Antibiotics:  Rocephin  9/17>>>  Azithromycin 9/17>>>  HPI/Subjective: More alert and conversant this AM  Objective: Filed Vitals:   10/04/13 1447 10/04/13 2021 10/05/13 0654 10/05/13 0957  BP: 145/85 151/82 118/86 149/65  Pulse: 73 78 68 71  Temp: 97.8 F (36.6 C) 98.2 F (36.8 C) 98.4 F (36.9 C) 97.9 F (36.6 C)  TempSrc: Oral Oral Oral Oral  Resp: 17 18 18 18   Height:      Weight:   77.384 kg (170 lb 9.6 oz)   SpO2: 100% 100% 98% 97%    Intake/Output Summary (Last 24 hours) at 10/05/13 1432 Last data filed at 10/05/13 1429  Gross per 24 hour  Intake 1478.33 ml  Output    600 ml  Net 878.33 ml   Filed Weights   10/03/13 0450 10/04/13 0652 10/05/13 0654  Weight: 76.2 kg (167 lb 15.9 oz) 76.2 kg (167 lb 15.9 oz) 77.384 kg (170 lb 9.6 oz)    Exam:   General:  Awake, in nad  Cardiovascular: regular, s1, s2  Respiratory: normal resp effort, no wheezing  Abdomen: soft,nondistended  Musculoskeletal: perfused, no clubbing   Data Reviewed: Basic Metabolic Panel:  Recent Labs Lab 10/01/13 1845 10/01/13 2315 10/02/13 0455 10/03/13 0403 10/03/13 2250 10/04/13 0825 10/05/13 0700  NA 143  --  142 143  --  146 144  K 3.4*  --  3.1* 3.2*  --  3.5* 3.5*  CL 103  --  102 105  --  109 109  CO2 23  --  21 19  --  22 20  GLUCOSE 150*  --  101*  124*  --  96 107*  BUN 43*  --  45* 45*  --  45* 42*  CREATININE 5.37* 5.14* 5.32* 5.37*  --  5.49* 4.99*  CALCIUM 8.5  --  8.1* 8.5  --  8.6 8.5  MG  --   --   --   --  1.8  --   --    Liver Function Tests:  Recent Labs Lab 10/01/13 1845 10/02/13 0455 10/03/13 0403 10/04/13 0825 10/05/13 0700  AST 13 22 14 17 16   ALT 8 5 5 5 6   ALKPHOS 69 61 64 57 51  BILITOT 0.4 0.4 0.4 0.4 0.3  PROT 7.3 6.3 6.8 6.1 6.2  ALBUMIN 3.1* 2.7* 2.9* 2.7* 2.6*   No results found for this basename: LIPASE, AMYLASE,  in the last 168 hours No results found for this basename: AMMONIA,  in the last 168 hours CBC:  Recent Labs Lab 10/01/13 2315  10/02/13 0455 10/03/13 0403 10/04/13 0825 10/05/13 0700  WBC 8.1 7.1 9.4 7.7 7.8  NEUTROABS  --  4.4 7.5 5.0 5.3  HGB 12.5* 12.3* 13.1 11.8* 11.8*  HCT 36.6* 36.1* 38.9* 35.2* 35.1*  MCV 87.4 87.0 87.8 90.3 89.1  PLT 245 225 257 212 233   Cardiac Enzymes:  Recent Labs Lab 10/01/13 2315 10/02/13 0455 10/02/13 0955  TROPONINI <0.30 <0.30 <0.30   BNP (last 3 results)  Recent Labs  10/01/13 1845  PROBNP 17357.0*   CBG: No results found for this basename: GLUCAP,  in the last 168 hours  Recent Results (from the past 240 hour(s))  CULTURE, BLOOD (ROUTINE X 2)     Status: None   Collection Time    10/01/13 11:08 PM      Result Value Ref Range Status   Specimen Description BLOOD ARM LEFT   Final   Special Requests     Final   Value: BOTTLES DRAWN AEROBIC AND ANAEROBIC 10CC AZITHROMYCIN, CEFTRIAXONE   Culture  Setup Time     Final   Value: 10/02/2013 05:41     Performed at [facility]   Culture     Final   Value:        BLOOD CULTURE RECEIVED NO GROWTH TO DATE CULTURE WILL BE HELD FOR 5 DAYS BEFORE ISSUING A FINAL NEGATIVE REPORT     Performed at [facility]   Report Status PENDING   Incomplete  CULTURE, BLOOD (ROUTINE X 2)     Status: None   Collection Time    10/01/13 11:15 PM      Result Value Ref Range Status   Specimen Description BLOOD HAND LEFT   Final   Special Requests     Final   Value: BOTTLES DRAWN AEROBIC ONLY 10CC AZITHROMYCIN, CEFTRIAXONE   Culture  Setup Time     Final   Value: 10/02/2013 05:40     Performed at [facility]   Culture     Final   Value:        BLOOD CULTURE RECEIVED NO GROWTH TO DATE CULTURE WILL BE HELD FOR 5 DAYS BEFORE ISSUING A FINAL NEGATIVE REPORT     Performed at [facility]   Report Status PENDING   Incomplete  URINE CULTURE     Status: None   Collection Time    10/02/13  7:59 AM      Result Value Ref Range Status   Specimen Description URINE, RANDOM   Final   Special Requests NONE  Final   Culture  Setup Time     Final   Value: 10/02/2013 14:34     Performed at [place]     Final   Value: 6,000 COLONIES/ML     Performed at [facility]   Culture     Final   Value: INSIGNIFICANT GROWTH     Performed at [facility]   Report Status 10/03/2013 FINAL   Final     Studies: No results found.  Scheduled Meds: . azithromycin  500 mg Intravenous Q24H  . cefTRIAXone (ROCEPHIN)  IV  1 g Intravenous Q24H  . feeding supplement (ENSURE COMPLETE)  237 mL Oral BID BM  . metoprolol tartrate  25 mg Oral BID  . sodium chloride  3 mL Intravenous Q12H   Continuous Infusions: . 0.9 % NaCl with KCl 20 mEq / L 50 mL/hr at 10/04/13 1725    Active Problems:   Chest pain   CAP (community acquired pneumonia)   AKI (acute kidney injury)   Cardiomyopathy  Time spent: 6min  [facility], [place] [facility] Pager [phone]. If 7PM-7AM, please contact night-coverage at [email or web address], password [facility] 10/05/2013, 2:32 PM  LOS: 4 days

## 2013-10-05 NOTE — Progress Notes (Signed)
The patient remained A&Ox4 overnight.  A family member stayed with him.  He did not have any complaints of pain and did not receive any PRN medications.

## 2013-10-05 NOTE — Progress Notes (Signed)
Patient ID: Justin Lang., male   DOB: Apr 16, 1922, 80 y.o.   MRN: [id number]   Subjective: Patient reports no significant clinical change.  He has poor appetite but no real pain. INR slowly improving now 3.7.  Creatinine stable to slightly better at 4.99.  Objective: Vital signs in last 24 hours: Temp:  [97.9 F (36.6 C)-98.4 F (36.9 C)] 97.9 F (36.6 C) (09/21 0957) Pulse Rate:  [64-78] 64 (09/21 1517) Resp:  [18] 18 (09/21 1517) BP: (118-151)/(65-87) 143/87 mmHg (09/21 1517) SpO2:  [97 %-100 %] 100 % (09/21 1517) Weight:  [77.384 kg (170 lb 9.6 oz)] 77.384 kg (170 lb 9.6 oz) (09/21 0654)  Intake/Output from previous day: 09/20 0701 - 09/21 0700 In: 1238.3 [P.O.:600; I.V.:638.3] Out: 875 [Urine:875] Intake/Output this shift: Total I/O In: 1398.3 [P.O.:360; I.V.:1038.3] Out: 100 [Urine:100]  Physical Exam:  Constitutional: Vital signs reviewed. WD WN in NAD   Eyes: PERRL, No scleral icterus.   Cardiovascular: RRR Pulmonary/Chest: Normal effort Abdominal: Soft. Genitourinary:no change Extremities: No cyanosis or edema   Lab Results:  Recent Labs  10/03/13 0403 10/04/13 0825 10/05/13 0700  HGB 13.1 11.8* 11.8*  HCT 38.9* 35.2* 35.1*   BMET  Recent Labs  10/04/13 0825 10/05/13 0700  NA 146 144  K 3.5* 3.5*  CL 109 109  CO2 22 20  GLUCOSE 96 107*  BUN 45* 42*  CREATININE 5.49* 4.99*  CALCIUM 8.6 8.5    Recent Labs  10/03/13 0403 10/04/13 0825 10/05/13 0700  INR 4.90* 4.68* 3.70*   No results found for this basename: LABURIN,  in the last 72 hours Results for orders placed during the hospital encounter of 10/01/13  CULTURE, BLOOD (ROUTINE X 2)     Status: None   Collection Time    10/01/13 11:08 PM      Result Value Ref Range Status   Specimen Description BLOOD ARM LEFT   Final   Special Requests     Final   Value: BOTTLES DRAWN AEROBIC AND ANAEROBIC 10CC AZITHROMYCIN, CEFTRIAXONE   Culture  Setup Time     Final   Value: 10/02/2013 05:41      Performed at [facility]   Culture     Final   Value:        BLOOD CULTURE RECEIVED NO GROWTH TO DATE CULTURE WILL BE HELD FOR 5 DAYS BEFORE ISSUING A FINAL NEGATIVE REPORT     Performed at [facility]   Report Status PENDING   Incomplete  CULTURE, BLOOD (ROUTINE X 2)     Status: None   Collection Time    10/01/13 11:15 PM      Result Value Ref Range Status   Specimen Description BLOOD HAND LEFT   Final   Special Requests     Final   Value: BOTTLES DRAWN AEROBIC ONLY 10CC AZITHROMYCIN, CEFTRIAXONE   Culture  Setup Time     Final   Value: 10/02/2013 05:40     Performed at [facility]   Culture     Final   Value:        BLOOD CULTURE RECEIVED NO GROWTH TO DATE CULTURE WILL BE HELD FOR 5 DAYS BEFORE ISSUING A FINAL NEGATIVE REPORT     Performed at [facility]   Report Status PENDING   Incomplete  URINE CULTURE     Status: None   Collection Time    10/02/13  7:59 AM      Result Value Ref  Range Status   Specimen Description URINE, RANDOM   Final   Special Requests NONE   Final   Culture  Setup Time     Final   Value: 10/02/2013 14:34     Performed at [place]     Final   Value: 6,000 COLONIES/ML     Performed at [facility]   Culture     Final   Value: INSIGNIFICANT GROWTH     Performed at [facility]   Report Status 10/03/2013 FINAL   Final    Studies/Results: No results found.  Assessment/Plan:   Obstructed right renal unit with atrophic left kidney.  Family did decide to proceed with decompression of the right renal unit.  Renal function has stabilized.  We are awaiting normalization of INR to have interventional radiology proceed with right nephrostomy tube placement.   LOS: 4 days   Merisa Julio S 10/05/2013, 5:35 PM

## 2013-10-05 NOTE — Progress Notes (Signed)
UR completed Kaspar Albornoz K. Gerilynn Mccullars, RN, BSN, Annetta South, CCM  10/05/2013 5:28 PM

## 2013-10-05 NOTE — Care Management Note (Addendum)
  Page 2 of 2   10/09/2013     10:59:36 AM CARE MANAGEMENT NOTE 10/09/2013  Patient:  Justin Lang, Justin Lang   Account Number:  [id number]  Date Initiated:  10/02/2013  Documentation initiated by:  Lorne Skeens  Subjective/Objective Assessment:   Patient was admitted with chest pain, cardiomyopathy, AKI, CAP. Lives at home with spouse.     Action/Plan:   Will follow for discharge needs   Anticipated DC Date:  10/06/2013   Anticipated DC Plan:  [place]  CM consult      PAC Choice  HOSPICE   Choice offered to / List presented to:  C-1 Patient           [place] agency  [facility] [place]   Status of service:  Completed, signed off Medicare Important Message given?  YES (If response is "NO", the following Medicare IM given date fields will be blank) Date Medicare IM given:  10/05/2013 Medicare IM given by:  Mikaylee Arseneau Date Additional Medicare IM given:  10/09/2013 Additional Medicare IM given by:  Essance Gatti  Discharge Disposition:  HOME/SELF CARE  Per UR Regulation:  Reviewed for med. necessity/level of care/duration of stay  If discussed at [place] of Stay Meetings, dates discussed:   10/06/2013    Comments:  Mariann Laster RN, BSN, MSHL, CCM  Nurse - Case Manager,  (Unit [facility])  [phone]  10/09/2013 Nephrostomy tube placement 10/09/2013 Onconlogist:  Dr. Benay Spice Dx:  Metastatic Colon CA Disposition Plan:  Home with [place] ([place] (Hospice Referral called to contact Elsie at [phone])    Mariann Laster RN, BSN, MSHL, CCM  Nurse - Case Manager,  (Unit [facility])  [phone]  10/05/2013 New CHF O2 2L prn Dispo Plan:  Home / Self care - CM will continue to monitor.

## 2013-10-05 NOTE — Progress Notes (Signed)
PHARMACY NOTE  Pharmacy Consult :  80 y.o. male is currently on Coumadin for DVT.   Dosing Wt :  77 kg  Hematology :  Recent Labs  10/03/13 0403 10/04/13 0825 10/05/13 0700  HGB 13.1 11.8* 11.8*  HCT 38.9* 35.2* 35.1*  PLT 257 212 233  LABPROT 45.7* 44.1* 36.7*  INR 4.90* 4.68* 3.70*  CREATININE 5.37* 5.49* 4.99*    Current Medication[s] Include: Medication PTA: Prescriptions prior to admission  Medication Sig Dispense Refill  . cholecalciferol (VITAMIN D) 1000 UNITS tablet Take 2,000 Units by mouth at bedtime.       . diphenoxylate-atropine (LOMOTIL) 2.5-0.025 MG per tablet Take 1 tablet by mouth 4 (four) times daily as needed for diarrhea or loose stools.       . mupirocin ointment (BACTROBAN) 2 % Apply 1 application topically 2 (two) times daily.       Marland Kitchen warfarin (COUMADIN) 2 MG tablet Take 1 tablet (2 mg total) by mouth as directed. Taken with 5mg  tablet on Mon, Wed & Fridays.  30 tablet  1  . warfarin (COUMADIN) 5 MG tablet Take 5 mg by mouth daily except 7 mg on Tues/Thurs or as directed.  30 tablet  3   Scheduled:  Scheduled:  . azithromycin  500 mg Intravenous Q24H  . cefTRIAXone (ROCEPHIN)  IV  1 g Intravenous Q24H  . metoprolol tartrate  25 mg Oral BID  . sodium chloride  3 mL Intravenous Q12H   Infusion[s]: Infusions:  . 0.9 % NaCl with KCl 20 mEq / L 50 mL/hr at 10/04/13 1725   Antibiotic[s]: Anti-infectives   Start     Dose/Rate Route Frequency Ordered Stop   10/01/13 2300  cefTRIAXone (ROCEPHIN) 1 g in dextrose 5 % 50 mL IVPB     1 g 100 mL/hr over 30 Minutes Intravenous Every 24 hours 10/01/13 2253 10/08/13 2159   10/01/13 2300  azithromycin (ZITHROMAX) 500 mg in dextrose 5 % 250 mL IVPB     500 mg 250 mL/hr over 60 Minutes Intravenous Every 24 hours 10/01/13 2253 10/08/13 2159   10/01/13 2130  cefTRIAXone (ROCEPHIN) 1 g in dextrose 5 % 50 mL IVPB     1 g 100 mL/hr over 30 Minutes Intravenous  Once 10/01/13 2121  10/01/13 2244   10/01/13 2130  azithromycin (ZITHROMAX) 500 mg in dextrose 5 % 250 mL IVPB     500 mg 250 mL/hr over 60 Minutes Intravenous  Once 10/01/13 2121 10/02/13 0008      Assessment :  Today's INR remains Supra-therapeutic.   INR  3.7.    No evidence of bleeding complications observed.  Goal :  INR goal is 2-3  Plan : 1. Continue to hold Coumadin today. 2. Daily INR's, CBC. Monitor for bleeding complications.   Follow Platelet counts.  Lynetta Tomczak, Craig Guess,  Pharm.D  10/05/2013  10:52 AM

## 2013-10-05 NOTE — Progress Notes (Signed)
Patient Name: Justin Lang. Date of Encounter: 10/05/2013  Active Problems:   Chest pain   CAP (community acquired pneumonia)   AKI (acute kidney injury)   Cardiomyopathy    Patient Profile: 80 yo male w/ hx metastatic colon CA, MDT PPM, DVT on coumadin, PE w/ IVC filter, admitted 09/17 w/ PNA, acute on chronic RF w/ obstruction of solitary R kidney 2nd mets. Cards saw 09/20 for EF newly 20-25% and NSVT  SUBJECTIVE: Nauseated today. Breathing OK, no palpitations. Pt and wife were not clear on meaning of DNR. Reviewed this with them, wife states they have been told there is no treatment for the cancer. They will discuss and decide.   OBJECTIVE Filed Vitals:   10/04/13 0652 10/04/13 1447 10/04/13 2021 10/05/13 0654  BP: 134/78 145/85 151/82 118/86  Pulse: 72 73 78 68  Temp: 98 F (36.7 C) 97.8 F (36.6 C) 98.2 F (36.8 C) 98.4 F (36.9 C)  TempSrc: Oral Oral Oral Oral  Resp: 17 17 18 18   Height:      Weight: 167 lb 15.9 oz (76.2 kg)   170 lb 9.6 oz (77.384 kg)  SpO2: 95% 100% 100% 98%    Intake/Output Summary (Last 24 hours) at 10/05/13 0929 Last data filed at 10/05/13 0911  Gross per 24 hour  Intake 1478.33 ml  Output    775 ml  Net 703.33 ml   Filed Weights   10/03/13 0450 10/04/13 0652 10/05/13 0654  Weight: 167 lb 15.9 oz (76.2 kg) 167 lb 15.9 oz (76.2 kg) 170 lb 9.6 oz (77.384 kg)    PHYSICAL EXAM General: Well developed, well nourished, male in no acute distress. Head: Normocephalic, atraumatic.  Neck: Supple without bruits, JVD not elevated. Lungs:  Resp regular and unlabored, rales bases Heart: RRR, S1, S2, no S3, S4, or murmur; no rub. Abdomen: Soft, non-tender, slightly distended, BS + x 4.  Extremities: No clubbing, cyanosis, no edema.  Neuro: Alert and oriented X 3. Moves all extremities spontaneously. Psych: Normal affect.  LABS: CBC: Recent Labs  10/04/13 0825 10/05/13 0700  WBC 7.7 7.8  NEUTROABS 5.0 5.3  HGB 11.8* 11.8*  HCT  35.2* 35.1*  MCV 90.3 89.1  PLT 212 233   INR: Recent Labs  10/05/13 0700  INR 3.84*   Basic Metabolic Panel: Recent Labs  10/03/13 2250 10/04/13 0825 10/05/13 0700  NA  --  146 144  K  --  3.5* 3.5*  CL  --  109 109  CO2  --  22 20  GLUCOSE  --  96 107*  BUN  --  45* 42*  CREATININE  --  5.49* 4.99*  CALCIUM  --  8.6 8.5  MG 1.8  --   --    Liver Function Tests: Recent Labs  10/04/13 0825 10/05/13 0700  AST 17 16  ALT 5 6  ALKPHOS 57 51  BILITOT 0.4 0.3  PROT 6.1 6.2  ALBUMIN 2.7* 2.6*   Cardiac Enzymes: Recent Labs  10/02/13 0955  TROPONINI <0.30   BNP: Pro B Natriuretic peptide (BNP)  Date/Time Value Ref Range Status  10/01/2013  6:45 PM 17357.0* 0 - 450 pg/mL Final   Thyroid Function Tests: Recent Labs  10/02/13 0955  TSH 3.190   TELE: A pacing, V pacing with intrinsic beats and frequent PVCs, no NSVT seen.  ECHO: 10/02/2013  Study Conclusions - Left ventricle: The cavity size was normal. There was mild concentric hypertrophy. Systolic function was  severely reduced. The estimated ejection fraction was in the range of 20% to 25%. Wall motion was normal; there were no regional wall motion abnormalities. Features are consistent with a pseudonormal left ventricular filling pattern, with concomitant abnormal relaxation and increased filling pressure (grade 2 diastolic dysfunction). Doppler parameters are consistent with elevated ventricular end-diastolic filling pressure. - Aortic valve: Trileaflet; mildly thickened leaflets. There was mild regurgitation. - Aortic root: The aortic root was normal in size. - Mitral valve: Calcified annulus. Mildly thickened leaflets . There was moderate regurgitation. - Left atrium: The atrium was moderately dilated. - Right ventricle: Systolic function was normal. - Right atrium: The atrium was mildly dilated. - Tricuspid valve: There was moderate regurgitation. - Pulmonary arteries: Systolic pressure was  mildly increased. PA peak pressure: 44 mm Hg (S). Impressions: - Mild left ventricular hypertrophy with severe systolic dysfunction. Diffuse hypokinesis. Modertae mitral and tricuspid regurgitation. Mild aortic and pulmonary regurgitation. Mild pulmonary hypertension.  Radiology/Studies: Ct Abdomen Pelvis Wo Contrast 10/02/2013   CLINICAL DATA:  Diffuse abdominal pain and chest pain. Assess for malignancy.  EXAM: CT CHEST, ABDOMEN AND PELVIS WITHOUT CONTRAST  TECHNIQUE: Multidetector CT imaging of the chest, abdomen and pelvis was performed following the standard protocol without IV contrast.  COMPARISON:  CT of the abdomen and pelvis performed 03/16/2013, and CT of the chest performed 08/08/2012  FINDINGS: CT CHEST FINDINGS  Small to moderate right and small left pleural effusions are seen. Patchy bilateral airspace opacities are noted, with underlying interstitial prominence, concerning for pulmonary edema. No pneumothorax is seen. No masses are identified, though evaluation is limited given airspace opacification.  Diffuse coronary artery calcifications are seen. A mildly enlarged right paratracheal node is seen, measuring 1.4 cm in short axis. No definite hilar lymphadenopathy is appreciated. The great vessels are grossly unremarkable in appearance. No pericardial effusion is identified. A pacemaker lead is ending at the right ventricle; the pacemaker is seen at the anterior left chest wall.  The thyroid gland is unremarkable. No axillary lymphadenopathy is seen.  No acute osseous abnormalities are identified.  CT ABDOMEN AND PELVIS FINDINGS  A few hypodensities are noted within the liver, measuring up to 2.1 cm in size. These are relatively stable from the prior study and likely benign. A small calcified granuloma is noted near the hepatic hilum. The spleen is unremarkable in appearance. The patient is status post cholecystectomy, with clips noted at the gallbladder fossa. The pancreas and adrenal  glands are unremarkable.  The previously noted peritoneal metastasis at the right lower quadrant has increased in size, now measuring approximately 2.2 x 1.9 cm. This obscures the right ureter at this level, with resultant dilatation of the right ureter and relatively severe right-sided hydronephrosis.  A nonobstructing 6 mm stone is noted within the dilated right ureter, proximal to the mass. There is severe left-sided renal atrophy, with several stones noted at the left renal pelvis, measuring up to 8 mm in size.  No free fluid is identified. Postoperative change is noted about the proximal small bowel. The visualized small bowel is otherwise unremarkable. The stomach is within normal limits. No acute vascular abnormalities are seen. The IVC filter is noted in expected position. Scattered calcification is seen along the abdominal aorta and its branches.  The patient is status post appendectomy. Scattered diverticulosis is noted along the descending and sigmoid colon, without evidence of diverticulitis. Contrast progresses to the level of the rectum. Postoperative change is noted at the proximal sigmoid colon.  The bladder is  mildly distended. A prominent bladder calculus is again noted. There is nodular extension of the prostate at the base of the bladder, relatively stable from prior studies. The prostate is enlarged, measuring 5.5 cm in transverse dimension. No inguinal lymphadenopathy is seen.  No acute osseous abnormalities are identified. Facet disease is noted at the lower lumbar spine.  IMPRESSION: 1. Small to moderate right and small left pleural effusions seen. Patchy bilateral airspace opacification, with underlying interstitial prominence, compatible with pulmonary edema. 2. Peritoneal metastasis at the right lower quadrant has increased in size, now measuring 2.2 x 1.9 cm, versus 1.6 x 1.0 cm on the prior study. This now obscures the right ureter at this level, with resultant dilatation of the right  ureter and relatively severe right-sided hydronephrosis. Given the patient's left renal atrophy, this would explain the patient's acute renal failure. 3. Nonobstructing 6 mm stone within the dilated right ureter, proximal to the mass. 4. Severe left-sided renal atrophy, with several stones at the left renal pelvis. 5. Mildly enlarged right paratracheal node measures 1.4 cm in short axis; this is nonspecific in nature. It appears increased in size from 2014. 6. Scattered diverticulosis along the descending and sigmoid colon, without evidence of diverticulitis. 7. Nodular extension of the prostate at the base of the bladder, stable from prior studies. Enlarged prostate seen. 8. Prominent bladder calculus again noted. 9. Diffuse coronary artery calcifications seen. 10. A few hypodensities within the liver, measuring up to 2.1 cm in size, appear relatively stable and may reflect cysts. 11. Scattered calcification along the abdominal aorta and its branches.  These results were called by telephone at the time of interpretation on 10/02/2013 at 5:38 am to [facility] on [facility], who verbally acknowledged these results.   Electronically Signed   By: Garald Balding M.D.   On: 10/02/2013 05:41   Dg Chest 2 View 10/01/2013   CLINICAL DATA:  Chest pain and shortness of breath  EXAM: CHEST  2 VIEW  COMPARISON:  08/20/2012 and prior radiographs  FINDINGS: Cardiomegaly and left-sided pacemaker again noted.  Left lower lobe airspace disease/ consolidation likely represents pneumonia.  There is no evidence of pleural effusion, pneumothorax or pulmonary edema.  No acute bony abnormalities are noted.  IMPRESSION: Left lower lobe airspace disease/consolidation likely representing pneumonia. Radiographic follow-up to resolution is recommended.  Cardiomegaly.   Electronically Signed   By: Hassan Rowan M.D.   On: 10/01/2013 20:55   Current Medications:  . azithromycin  500 mg Intravenous Q24H  . cefTRIAXone (ROCEPHIN)  IV  1 g Intravenous  Q24H  . metoprolol tartrate  25 mg Oral BID  . sodium chloride  3 mL Intravenous Q12H   . 0.9 % NaCl with KCl 20 mEq / L 50 mL/hr at 10/04/13 1725    ASSESSMENT AND PLAN: Active Problems:   Chest pain - resolved and troponin negative x 3. MD advise on adding ASA     CAP (community acquired pneumonia) - Per IM    AKI (acute kidney injury) - per IM/Urology, nephrostomy tube planned when INR low enough.    Cardiomyopathy - new dx, EF 20-25% w/ no RWMA, grade 2 diast dysf. BB added, BP tolerating but pt nauseated this am. No ACE/ARB with poor renal function, no diuretic with possible obstructive renal disease. Follow.    Overanticoagulation - INR 4.68 on admit, then 4.9, trending down, reversal discussed but not given. Per IM    NSVT - seen on telemetry and PPM interrogation previously. None  overnight.  Signed, Rosaria Ferries , PA-C 9:29 AM 10/05/2013   Pt seen & examined this PM after Ms. Barrett - agree with findings, exam & recommendations.  80 y/o man - with newly Dx'd dilated CM - EF 20-25% with multiple other co-morbidities admitted with what seems to be PNA.  No signs of acute CHF exacerbation - agree with holding diuretic.    Also agree with holding wafarin until levels return to normal as opposed to reversing - unless Urostomy tube is urgent.  NSVT is non-unexpected.  Continue BB.  Will continue to follow along, but appears to be mostly PNA.   Leonie Man, M.D., M.S. Interventional Cardiologist   Pager # [phone]

## 2013-10-06 ENCOUNTER — Encounter (HOSPITAL_COMMUNITY): Payer: Self-pay | Admitting: Cardiology

## 2013-10-06 ENCOUNTER — Inpatient Hospital Stay (HOSPITAL_COMMUNITY): Payer: Medicare Other

## 2013-10-06 DIAGNOSIS — I428 Other cardiomyopathies: Secondary | ICD-10-CM

## 2013-10-06 LAB — CBC WITH DIFFERENTIAL/PLATELET
Basophils Absolute: 0 10*3/uL (ref 0.0–0.1)
Basophils Relative: 0 % (ref 0–1)
EOS PCT: 7 % — AB (ref 0–5)
Eosinophils Absolute: 0.6 10*3/uL (ref 0.0–0.7)
HCT: 36.4 % — ABNORMAL LOW (ref 39.0–52.0)
HEMOGLOBIN: 12.1 g/dL — AB (ref 13.0–17.0)
Lymphocytes Relative: 11 % — ABNORMAL LOW (ref 12–46)
Lymphs Abs: 1 10*3/uL (ref 0.7–4.0)
MCH: 30 pg (ref 26.0–34.0)
MCHC: 33.2 g/dL (ref 30.0–36.0)
MCV: 90.3 fL (ref 78.0–100.0)
MONO ABS: 1.1 10*3/uL — AB (ref 0.1–1.0)
MONOS PCT: 12 % (ref 3–12)
Neutro Abs: 6.6 10*3/uL (ref 1.7–7.7)
Neutrophils Relative %: 70 % (ref 43–77)
Platelets: 232 10*3/uL (ref 150–400)
RBC: 4.03 MIL/uL — ABNORMAL LOW (ref 4.22–5.81)
RDW: 15.5 % (ref 11.5–15.5)
WBC: 9.4 10*3/uL (ref 4.0–10.5)

## 2013-10-06 LAB — COMPREHENSIVE METABOLIC PANEL
ALT: 8 U/L (ref 0–53)
AST: 16 U/L (ref 0–37)
Albumin: 2.9 g/dL — ABNORMAL LOW (ref 3.5–5.2)
Alkaline Phosphatase: 58 U/L (ref 39–117)
Anion gap: 14 (ref 5–15)
BILIRUBIN TOTAL: 0.3 mg/dL (ref 0.3–1.2)
BUN: 41 mg/dL — ABNORMAL HIGH (ref 6–23)
CO2: 22 mEq/L (ref 19–32)
Calcium: 9.1 mg/dL (ref 8.4–10.5)
Chloride: 107 mEq/L (ref 96–112)
Creatinine, Ser: 4.85 mg/dL — ABNORMAL HIGH (ref 0.50–1.35)
GFR calc Af Amer: 11 mL/min — ABNORMAL LOW (ref >90)
GFR calc non Af Amer: 10 mL/min — ABNORMAL LOW (ref >90)
Glucose, Bld: 103 mg/dL — ABNORMAL HIGH (ref 70–99)
Potassium: 4.7 mEq/L (ref 3.7–5.3)
SODIUM: 143 meq/L (ref 137–147)
TOTAL PROTEIN: 6.5 g/dL (ref 6.0–8.3)

## 2013-10-06 LAB — PROTIME-INR
INR: 2.92 — ABNORMAL HIGH (ref 0.00–1.49)
Prothrombin Time: 30.5 seconds — ABNORMAL HIGH (ref 11.6–15.2)

## 2013-10-06 MED ORDER — MORPHINE SULFATE 2 MG/ML IJ SOLN
2.0000 mg | INTRAMUSCULAR | Status: DC | PRN
  Administered 2013-10-06 – 2013-10-09 (×4): 2 mg via INTRAVENOUS
  Filled 2013-10-06 (×4): qty 1

## 2013-10-06 NOTE — Evaluation (Signed)
Physical Therapy Evaluation Patient Details Name: Justin Lang. MRN: [id number] DOB: 09-24-1922 Today's Date: 10/06/2013   History of Present Illness  80 y.o. year old male with significant past medical history of stage 4 prostate cancer, SA node dysfunction s/p pacemaker, hx/o DVT s/p IVC filter on coumadin presenting with with chest pain, , CAP, AKI, cardiomyopathy. Per wife pt has had progressive malaise and weakness. Wife reports that pt has not had chemotherapy or radiation for about 10 months as pt cannot tolerate this. Over the past 1-2 days, pt complaining of chest pain and worsening weakness. Also with mild cough and low back/kidney pain.   Clinical Impression  Pt admitted with chest pain. Pt currently with functional limitations due to the deficits listed below (see PT Problem List). Pt will benefit from skilled PT to increase their independence and safety with mobility to allow discharge to the venue listed below. Pt's wife educated on safety and use of chair alarm while in hospital in case she leaves the room even briefly.  She is very supportive and states he is never at home alone.  Recommend HHPT follow up after d/c from acute care.    Follow Up Recommendations Home health PT    Equipment Recommendations  None recommended by PT    Recommendations for Other Services       Precautions / Restrictions Restrictions Weight Bearing Restrictions: No      Mobility  Bed Mobility Overal bed mobility: Needs Assistance Bed Mobility: Supine to Sit     Supine to sit: Min assist     General bed mobility comments: Pulled up on wife's hand  Transfers Overall transfer level: Needs assistance Equipment used: Rolling walker (2 wheeled) Transfers: Sit to/from Stand Sit to Stand: Min guard         General transfer comment: cueing to get to EOB and stood with min/guard on 3rd attempt  Ambulation/Gait Ambulation/Gait assistance: Min assist Ambulation Distance (Feet): 60  Feet Assistive device: Rolling walker (2 wheeled) Gait Pattern/deviations: Trunk flexed;Decreased step length - right;Decreased step length - left     General Gait Details: Difficulty staying within RW especially with turns.  Stairs            Wheelchair Mobility    Modified Rankin (Stroke Patients Only)       Balance Overall balance assessment: Needs assistance           Standing balance-Leahy Scale: Poor Standing balance comment: stood and used urinal with wife A                             Pertinent Vitals/Pain Pain Assessment: No/denies pain    Home Living Family/patient expects to be discharged to:: Private residence Living Arrangements: Alone Available Help at Discharge: Family;Available 24 hours/day Type of Home: House Home Access: Stairs to enter Entrance Stairs-Rails: Right;Left;Can reach both Entrance Stairs-Number of Steps: 3 Home Layout: Two level;Able to live on main level with bedroom/bathroom Home Equipment: Kasandra Knudsen - single point;Walker - 2 wheels;Wheelchair - manual;Shower seat - built in;Bedside commode;Toilet riser      Prior Function Level of Independence: Independent with assistive device(s)         Comments: amb with cane     Hand Dominance        Extremity/Trunk Assessment   Upper Extremity Assessment: Defer to OT evaluation           Lower Extremity Assessment: Generalized weakness;Overall [facility] for  tasks assessed      Cervical / Trunk Assessment: Kyphotic (minimal)  Communication   Communication: No difficulties  Cognition Arousal/Alertness: Awake/alert Behavior During Therapy: WFL for tasks assessed/performed Overall Cognitive Status: History of cognitive impairments - at baseline       Memory: Decreased short-term memory              General Comments General comments (skin integrity, edema, etc.): RA o2 100% at EOB and 99-100% post gait    Exercises        Assessment/Plan    PT  Assessment Patient needs continued PT services  PT Diagnosis Difficulty walking;Generalized weakness   PT Problem List Decreased balance;Decreased mobility;Decreased safety awareness  PT Treatment Interventions Stair training;Functional mobility training;Therapeutic activities;Therapeutic exercise;Balance training;Gait training;Patient/family education   PT Goals (Current goals can be found in the Care Plan section) Acute Rehab PT Goals Patient Stated Goal: Get stronger and go home PT Goal Formulation: With patient/family Time For Goal Achievement: 10/20/13 Potential to Achieve Goals: Good    Frequency Min 3X/week   Barriers to discharge        Co-evaluation               End of Session Equipment Utilized During Treatment: Gait belt Activity Tolerance: Patient tolerated treatment well Patient left: in chair;with chair alarm set;with family/visitor present;with call bell/phone within reach Nurse Communication: Other (comment) (o2 sats)         Time: 4801-6553 PT Time Calculation (min): 36 min   Charges:   PT Evaluation $Initial PT Evaluation Tier I: 1 Procedure PT Treatments $Gait Training: 8-22 mins $Therapeutic Activity: 8-22 mins   PT G Codes:          Jencarlo Bonadonna LUBECK 10/06/2013, 11:42 AM

## 2013-10-06 NOTE — Progress Notes (Signed)
Report given to receiving RN. Patient in bed resting with family at bedside. No verbal complaints and no signs or symptoms of distress noted.

## 2013-10-06 NOTE — Progress Notes (Signed)
ANTICOAGULATION CONSULT NOTE - Follow Up Consult  Pharmacy Consult for Coumadin Indication: DVT Allergies  Allergen Reactions  . Vectibix [Panitumumab] Other (See Comments)    On 08/20/12 during Vectibix infusion at [facility] developed difficulty swallowing, unable answer questions due to lips/throat feeling "funny", confusion, with no affect. Has difficulty getting words out.  . Celecoxib Rash    CELEBREX    Patient Measurements: Height: 5\' 8"  (172.7 cm) Weight: 170 lb 9.6 oz (77.384 kg) (bed) IBW/kg (Calculated) : 68.4   Vital Signs: Temp: 98 F (36.7 C) (09/22 0606) Temp src: Oral (09/22 0606) BP: 149/72 mmHg (09/22 0606) Pulse Rate: 69 (09/22 0606)  Labs:  Recent Labs  10/04/13 0825 10/05/13 0700 10/06/13 0346  HGB 11.8* 11.8* 12.1*  HCT 35.2* 35.1* 36.4*  PLT 212 233 232  LABPROT 44.1* 36.7* 30.5*  INR 4.68* 3.70* 2.92*  CREATININE 5.49* 4.99* 4.85*    Estimated Creatinine Clearance: 10.2 ml/min (by C-G formula based on Cr of 4.85).   Medications:  Prescriptions prior to admission  Medication Sig Dispense Refill  . cholecalciferol (VITAMIN D) 1000 UNITS tablet Take 2,000 Units by mouth at bedtime.       . diphenoxylate-atropine (LOMOTIL) 2.5-0.025 MG per tablet Take 1 tablet by mouth 4 (four) times daily as needed for diarrhea or loose stools.       . mupirocin ointment (BACTROBAN) 2 % Apply 1 application topically 2 (two) times daily.       Marland Kitchen warfarin (COUMADIN) 2 MG tablet Take 1 tablet (2 mg total) by mouth as directed. Taken with 5mg  tablet on Mon, [place] & Fridays.  30 tablet  1  . warfarin (COUMADIN) 5 MG tablet Take 5 mg by mouth daily except 7 mg on Tues/Thurs or as directed.  30 tablet  3   Scheduled:  . azithromycin  500 mg Intravenous Q24H  . cefTRIAXone (ROCEPHIN)  IV  1 g Intravenous Q24H  . feeding supplement (ENSURE COMPLETE)  237 mL Oral BID BM  . metoprolol tartrate  25 mg Oral BID  . sodium chloride  3 mL Intravenous Q12H    Assessment: 80 y.o  male on warfarin prior to admission for hx DVT. INR= 2.92 this AM. INR has decreased from several days of >3.0 to within the therapeutic range today after coumadin being held since admission. (last dose taken PTA was on 10/01/13). CBC stable with H/H slightly below normal and PLTC wnl. No bleeding reported.  His coumadin dose PTA was 5 mg daily except 7mg  every Tues/Thursday with his last dose taken PTA on 10/01/13.   Urologist is awaiting normalization of INR to have IR proceed with right nephrostomy tube placement.    Goal of Therapy:  INR 2-3 Monitor platelets by anticoagulation protocol: Yes   Plan:  Holding coumadin for right nephrostomy tube placement. Daily PT/INR.   Nicole Cella, RPh Clinical Pharmacist Pager: [phone] 10/06/2013,8:45 AM

## 2013-10-06 NOTE — Progress Notes (Signed)
Pt. Continuously pulling at telemetry leads and disconnecting himself from the monitor. CMT notified. Pts. Son at bedside. RN will continue to monitor pt. For changes in condition.Rudie Rikard, Katherine Roan

## 2013-10-06 NOTE — Progress Notes (Signed)
Notified about abnormal pacer spikes seen on telemetry. Made Cardiologist aware. EKG obtained. New orders given. Will continue to monitor patient for further changes in condition.

## 2013-10-06 NOTE — Progress Notes (Addendum)
Interrogation of pacemaker completed and adjustments were made by rep.

## 2013-10-06 NOTE — Progress Notes (Addendum)
[facility] PROGRESS NOTE  Justin Lang. ACZ:[id number] DOB: [birth year] DOA: 10/01/2013 PCP: Delphina Cahill, MD  Off Service Summary [phone] who presented with chest pain, found to have likely CAP and ARF with Cr of over 5. On work up, pt was found to have a metastatic lesion from known colon cancer impinge on ureter. Urology following. PCN is planned however pt's INR is markedly elevated. Pt has a hypercoagulable history so our goal is to conservatively allow INR to trend down. Also, pt was found to have new EF of 23% with diastolic dysfunction. Also noted to have brief period of [facility]. Cardiology following. No plans for ICD per family wishes. On beta blocker now. Ultimately, plan to have PCN when INR lower.  Assessment/Plan: 1. Chest pain 1. Serial trop were neg 2. 2d echo with no WMA but with new 55-73% EF with diastolic dysfunction (see below) 2. 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 Pt appears compensated at this time 2. Presenting BNP was markedly elevated, but question if renal failure is contributing to elevated BNP 3. Given markedly low EF of 20-25%, would cont to avoid aggressive volume load - including blood products if we can avoid them (see below) 4. TSH was normal 5. Wts have trended as follows: 75kg->76kg->77kg with pos fluid balance, thus IVF have been d/c'd 6. Hold ACEI or arb secondary to renal failure 7. Cardiology following 3. CAP 1. Remains afebrile 2. Tolerating rocephin and azithrymycin 3. No leukocytosis 4. AKI 1. Now improving slowly 2. CT abd with enlarged metastatic lesion that appears to be resulting in R ureteral obstruction and hydroureter 3. Appreciate Urology input, with recs for PCN when INR reverses 4. INR initially supertherapeutic, but now in therapeutic range 5. Metastatic colon ca 1. Followed by Oncology as outpatient 6. Hx DVT 1. Pt with hypercoagulable state, is s/p IVC filter placement 2. INR supertherapeutic on  presentation, now in therapeutic range 3. No signs of active bleed 4. Coumadin remains on hold 5. Given EF of 20%, would avoid volume load at this time in the form of blood products such as FFP 6. Will continue conservative approach for now and monitor INR closely 7. If need to more aggressive reversal of INR, would give 5mg  vit K 7. Coumadin coagulopathy 1.  Per above 8. Wide complex tachycardia 1. Noted during this admit 2. Pt noted to be asymptomatic during this episode 3. Given above dx of EF of 20%, consulted Cardiology 4. Pt currently with pacemaker, not ICD per family 5. Started beta blocker as tolerated 6. Cardiology following  Code Status: DNR Family Communication: Pt and wife in room Disposition Plan: Pending   Consultants:  Urology  Cardiology  Procedures:    Antibiotics:  Rocephin 9/17>>>  Azithromycin 9/17>>>  HPI/Subjective: More awake and alert this AM, but was confused overnight, likely sundowned.  Objective: Filed Vitals:   10/05/13 2219 10/06/13 0606 10/06/13 1051 10/06/13 1521  BP: 123/79 149/72 150/93 164/85  Pulse: 74 69 70 69  Temp: 98 F (36.7 C) 98 F (36.7 C)  97.4 F (36.3 C)  TempSrc: Oral Oral  Oral  Resp: 18 18  18   Height:      Weight:      SpO2: 96% 97%  98%    Intake/Output Summary (Last 24 hours) at 10/06/13 1642 Last data filed at 10/06/13 1533  Gross per 24 hour  Intake    840 ml  Output    650 ml  Net  190 ml   Filed Weights   10/03/13 0450 10/04/13 0652 10/05/13 0654  Weight: 76.2 kg (167 lb 15.9 oz) 76.2 kg (167 lb 15.9 oz) 77.384 kg (170 lb 9.6 oz)    Exam:   General:  Awake, in nad  Cardiovascular: regular, s1, s2  Respiratory: normal resp effort, no wheezing  Abdomen: soft,nondistended  Musculoskeletal: perfused, no clubbing   Data Reviewed: Basic Metabolic Panel:  Recent Labs Lab 10/02/13 0455 10/03/13 0403 10/03/13 2250 10/04/13 0825 10/05/13 0700 10/06/13 0346  NA 142 143  --  146  144 143  K 3.1* 3.2*  --  3.5* 3.5* 4.7  CL 102 105  --  109 109 107  CO2 21 19  --  22 20 22   GLUCOSE 101* 124*  --  96 107* 103*  BUN 45* 45*  --  45* 42* 41*  CREATININE 5.32* 5.37*  --  5.49* 4.99* 4.85*  CALCIUM 8.1* 8.5  --  8.6 8.5 9.1  MG  --   --  1.8  --   --   --    Liver Function Tests:  Recent Labs Lab 10/02/13 0455 10/03/13 0403 10/04/13 0825 10/05/13 0700 10/06/13 0346  AST 22 14 17 16 16   ALT 5 5 5 6 8   ALKPHOS 61 64 57 51 58  BILITOT 0.4 0.4 0.4 0.3 0.3  PROT 6.3 6.8 6.1 6.2 6.5  ALBUMIN 2.7* 2.9* 2.7* 2.6* 2.9*   No results found for this basename: LIPASE, AMYLASE,  in the last 168 hours No results found for this basename: AMMONIA,  in the last 168 hours CBC:  Recent Labs Lab 10/02/13 0455 10/03/13 0403 10/04/13 0825 10/05/13 0700 10/06/13 0346  WBC 7.1 9.4 7.7 7.8 9.4  NEUTROABS 4.4 7.5 5.0 5.3 6.6  HGB 12.3* 13.1 11.8* 11.8* 12.1*  HCT 36.1* 38.9* 35.2* 35.1* 36.4*  MCV 87.0 87.8 90.3 89.1 90.3  PLT 225 257 212 233 232   Cardiac Enzymes:  Recent Labs Lab 10/01/13 2315 10/02/13 0455 10/02/13 0955  TROPONINI <0.30 <0.30 <0.30   BNP (last 3 results)  Recent Labs  10/01/13 1845  PROBNP 17357.0*   CBG: No results found for this basename: GLUCAP,  in the last 168 hours  Recent Results (from the past 240 hour(s))  CULTURE, BLOOD (ROUTINE X 2)     Status: None   Collection Time    10/01/13 11:08 PM      Result Value Ref Range Status   Specimen Description BLOOD ARM LEFT   Final   Special Requests     Final   Value: BOTTLES DRAWN AEROBIC AND ANAEROBIC 10CC AZITHROMYCIN, CEFTRIAXONE   Culture  Setup Time     Final   Value: 10/02/2013 05:41     Performed at [facility]   Culture     Final   Value:        BLOOD CULTURE RECEIVED NO GROWTH TO DATE CULTURE WILL BE HELD FOR 5 DAYS BEFORE ISSUING A FINAL NEGATIVE REPORT     Performed at [facility]   Report Status PENDING   Incomplete  CULTURE, BLOOD (ROUTINE X 2)      Status: None   Collection Time    10/01/13 11:15 PM      Result Value Ref Range Status   Specimen Description BLOOD HAND LEFT   Final   Special Requests     Final   Value: BOTTLES DRAWN AEROBIC ONLY 10CC AZITHROMYCIN, CEFTRIAXONE   Culture  Setup Time     Final   Value: 10/02/2013 05:40     Performed at [facility]   Culture     Final   Value:        BLOOD CULTURE RECEIVED NO GROWTH TO DATE CULTURE WILL BE HELD FOR 5 DAYS BEFORE ISSUING A FINAL NEGATIVE REPORT     Performed at [facility]   Report Status PENDING   Incomplete  URINE CULTURE     Status: None   Collection Time    10/02/13  7:59 AM      Result Value Ref Range Status   Specimen Description URINE, RANDOM   Final   Special Requests NONE   Final   Culture  Setup Time     Final   Value: 10/02/2013 14:34     Performed at [place]     Final   Value: 6,000 COLONIES/ML     Performed at [facility]   Culture     Final   Value: INSIGNIFICANT GROWTH     Performed at [facility]   Report Status 10/03/2013 FINAL   Final     Studies: No results found.  Scheduled Meds: . azithromycin  500 mg Intravenous Q24H  . cefTRIAXone (ROCEPHIN)  IV  1 g Intravenous Q24H  . feeding supplement (ENSURE COMPLETE)  237 mL Oral BID BM  . metoprolol tartrate  25 mg Oral BID  . sodium chloride  3 mL Intravenous Q12H   Continuous Infusions:    Active Problems:   Chest pain   CAP (community acquired pneumonia)   AKI (acute kidney injury)   Cardiomyopathy  Time spent: 5min  [facility], [facility] Pager [phone]. If 7PM-7AM, please contact night-coverage at [email or web address], password [facility] 10/06/2013, 4:42 PM  LOS: 5 days

## 2013-10-06 NOTE — Progress Notes (Signed)
Patient Name: Justin Lang. 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 Cards saw 09/20 for EF newly 20-25% and NSVT  SUBJECTIVE: Still Nauseated today. Breathing OK, no palpitations.  INR drifting down & renal fxn improving.  Still making urine.   OBJECTIVE Filed Vitals:   10/05/13 2123 10/05/13 2219 10/06/13 0606 10/06/13 1051  BP: 146/91 123/79 149/72 150/93  Pulse: 69 74 69 70  Temp:  98 F (36.7 C) 98 F (36.7 C)   TempSrc:  Oral Oral   Resp:  18 18   Height:      Weight:      SpO2: 100% 96% 97%     Intake/Output Summary (Last 24 hours) at 10/06/13 1512 Last data filed at 10/06/13 1157  Gross per 24 hour  Intake 1878.33 ml  Output    450 ml  Net 1428.33 ml   Filed Weights   10/03/13 0450 10/04/13 0652 10/05/13 0654  Weight: 167 lb 15.9 oz (76.2 kg) 167 lb 15.9 oz (76.2 kg) 170 lb 9.6 oz (77.384 kg)    PHYSICAL EXAM General: Well developed, well nourished, male in no acute distress. Head: Normocephalic, atraumatic.  Neck: Supple without bruits, JVD not elevated. Lungs:  Resp regular and unlabored, rales bases Heart: RRR, S1, S2, no S3, S4, or murmur; no rub. Abdomen: Soft, non-tender, slightly distended, BS + x 4.  Extremities: No clubbing, cyanosis, no edema.  Neuro: Alert and oriented X 3. Moves all extremities spontaneously. Psych: Normal affect.  LABS: CBC:  Recent Labs  10/05/13 0700 10/06/13 0346  WBC 7.8 9.4  NEUTROABS 5.3 6.6  HGB 11.8* 12.1*  HCT 35.1* 36.4*  MCV 89.1 90.3  PLT 233 232   INR:  Recent Labs  10/06/13 0346  INR 2.45*   Basic Metabolic Panel: Recent Labs  10/03/13 2250  10/05/13 0700 10/06/13 0346  NA  --   < > 144  143  K  --   < > 3.5* 4.7  CL  --   < > 109 107  CO2  --   < > 20 22  GLUCOSE  --   < > 107* 103*  BUN  --   < > 42* 41*  CREATININE  --   < > 4.99* 4.85*  CALCIUM  --   < > 8.5 9.1  MG 1.8  --   --   --   < > = values in this interval not displayed. Liver Function Tests:  Recent Labs  10/05/13 0700 10/06/13 0346  AST 16 16  ALT 6 8  ALKPHOS 51 58  BILITOT 0.3 0.3  PROT 6.2 6.5  ALBUMIN 2.6* 2.9*   Cardiac Enzymes:No results found for this basename: CKTOTAL, CKMB, CKMBINDEX, TROPONINI,  in the last 72 hours BNP: Pro B Natriuretic peptide (BNP)  Date/Time Value Ref Range Status  10/01/2013  6:45 PM 17357.0* 0 - 450 pg/mL Final   Thyroid Function Tests:No results found for this basename: TSH, T4TOTAL, FREET3, T3FREE, THYROIDAB,  in the last 72 hours TELE: A pacing, V pacing with intrinsic beats and frequent PVCs, no NSVT seen.  ECHO: 10/02/2013  Study Conclusions: - Left  ventricle: Nl size, mild concentric hypertrophy.  Severely reduced Systolic function -- EF ~32-95%, no obviousl regional WMA.  Grade 2 DD.    Current Medications:  . azithromycin  500 mg Intravenous Q24H  . cefTRIAXone (ROCEPHIN)  IV  1 g Intravenous Q24H  . feeding supplement (ENSURE COMPLETE)  237 mL Oral BID BM  . metoprolol tartrate  25 mg Oral BID  . sodium chloride  3 mL Intravenous Q12H      ASSESSMENT AND PLAN: Active Problems:   Chest pain - resolved and troponin negative x 3. Hold off on ASA until post Urology procedure.     CAP (community acquired pneumonia) - Per IM    AKI (acute kidney injury) - per IM/Urology, nephrostomy tube planned when INR low enough.  Unless he destabilizes - is better to allow INR to drift down.    Cardiomyopathy - new dx, EF 20-25% w/ no RWMA, grade 2 diast dysf. BB added, BP tolerating but pt nauseated this am. No ACE/ARB with poor renal function, no diuretic with possible obstructive renal disease. Follow.  No active SSx of acute CHF.    On BB.       Overanticoagulation - INR 4.68 on admit, then 4.9, trending down, reversal discussed but not given. Per IM    NSVT - seen on telemetry and PPM interrogation previously. None overnight.  Not unexpected with NICM & EF ~20-25%  Tele with abnormal Pacer Spikes -- EKG ordered.  Will ask [employer] Rep to interrogate.  Continue BB. Not on ARB/ACE-I due to renal insufficiency; as no active Sx, will hold off on adding afterload reducing agent.  Not on diuretic.  No pulmonary edema signs or significant edema.     Leonie Man, M.D., M.S. Interventional Cardiologist   Pager # [phone]

## 2013-10-06 NOTE — Progress Notes (Signed)
Order received from on call NP, K. Baltazar Najjar to d/c cardiac monitoring on pt. Justin Lang, Katherine Roan

## 2013-10-06 NOTE — Progress Notes (Signed)
Pt c/o abdominal pain rating at a 9 on a 0-10 scale. MD notified. New orders given will continue to monitor.

## 2013-10-07 ENCOUNTER — Inpatient Hospital Stay (HOSPITAL_COMMUNITY): Payer: Medicare Other

## 2013-10-07 LAB — CBC WITH DIFFERENTIAL/PLATELET
BASOS ABS: 0 10*3/uL (ref 0.0–0.1)
BASOS PCT: 0 % (ref 0–1)
EOS PCT: 6 % — AB (ref 0–5)
Eosinophils Absolute: 0.5 10*3/uL (ref 0.0–0.7)
HCT: 37.3 % — ABNORMAL LOW (ref 39.0–52.0)
Hemoglobin: 11.9 g/dL — ABNORMAL LOW (ref 13.0–17.0)
Lymphocytes Relative: 11 % — ABNORMAL LOW (ref 12–46)
Lymphs Abs: 1 10*3/uL (ref 0.7–4.0)
MCH: 29.8 pg (ref 26.0–34.0)
MCHC: 31.9 g/dL (ref 30.0–36.0)
MCV: 93.5 fL (ref 78.0–100.0)
Monocytes Absolute: 0.8 10*3/uL (ref 0.1–1.0)
Monocytes Relative: 9 % (ref 3–12)
Neutro Abs: 6.5 10*3/uL (ref 1.7–7.7)
Neutrophils Relative %: 74 % (ref 43–77)
PLATELETS: 254 10*3/uL (ref 150–400)
RBC: 3.99 MIL/uL — ABNORMAL LOW (ref 4.22–5.81)
RDW: 15.6 % — AB (ref 11.5–15.5)
WBC: 8.8 10*3/uL (ref 4.0–10.5)

## 2013-10-07 LAB — BASIC METABOLIC PANEL
Anion gap: 18 — ABNORMAL HIGH (ref 5–15)
BUN: 43 mg/dL — AB (ref 6–23)
CO2: 18 mEq/L — ABNORMAL LOW (ref 19–32)
CREATININE: 4.45 mg/dL — AB (ref 0.50–1.35)
Calcium: 9 mg/dL (ref 8.4–10.5)
Chloride: 105 mEq/L (ref 96–112)
GFR, EST AFRICAN AMERICAN: 12 mL/min — AB (ref >90)
GFR, EST NON AFRICAN AMERICAN: 11 mL/min — AB (ref >90)
Glucose, Bld: 110 mg/dL — ABNORMAL HIGH (ref 70–99)
Potassium: 4.5 mEq/L (ref 3.7–5.3)
Sodium: 141 mEq/L (ref 137–147)

## 2013-10-07 LAB — PROTIME-INR
INR: 2.06 — ABNORMAL HIGH (ref 0.00–1.49)
PROTHROMBIN TIME: 23.2 s — AB (ref 11.6–15.2)

## 2013-10-07 MED ORDER — PHENOL 1.4 % MT LIQD
1.0000 | OROMUCOSAL | Status: DC | PRN
  Filled 2013-10-07: qty 177

## 2013-10-07 NOTE — Progress Notes (Signed)
Patient Name: Justin Lang. Date of Encounter: 10/07/2013  Active Problems:   Chest pain   CAP (community acquired pneumonia)   AKI (acute kidney injury)   Cardiomyopathy   Patient Profile: 80 yo male w/ hx metastatic colon CA, MDT PPM, DVT on coumadin, PE w/ IVC filter, admitted 09/17 w/ PNA, acute on chronic RF w/ obstruction of solitary R kidney 2nd mets. Cards saw 09/20 for EF newly 20-25% and NSVT  SUBJECTIVE: Notes sore throat last PM -- coughed up dark sputum o/n.   High pitched stridor with breathing this AM Confused o/n -- Son very concerned.  INR drifting down & renal fxn improving.  Still making urine.  PPM adjusted by [employer] Rep yesterday.  OBJECTIVE Filed Vitals:   10/06/13 1521 10/07/13 0007 10/07/13 0431 10/07/13 0438  BP: 164/85 135/84 145/66   Pulse: 69 78 65   Temp: 97.4 F (36.3 C) 98.5 F (36.9 C) 97.3 F (36.3 C)   TempSrc: Oral Tympanic Oral   Resp: 18 20 20    Height:      Weight:   174 lb 1.6 oz (78.971 kg) 172 lb 6.4 oz (78.2 kg)  SpO2: 98% 97% 94%     Intake/Output Summary (Last 24 hours) at 10/07/13 0911 Last data filed at 10/07/13 0901  Gross per 24 hour  Intake    420 ml  Output    700 ml  Net   -280 ml   Filed Weights   10/05/13 0654 10/07/13 0431 10/07/13 0438  Weight: 170 lb 9.6 oz (77.384 kg) 174 lb 1.6 oz (78.971 kg) 172 lb 6.4 oz (78.2 kg)    PHYSICAL EXAM General: Well developed, well nourished, male -- looks a bit "out of sorts" today.  Confused & increased WOB Head: Normocephalic, atraumatic.  Neck: Supple without bruits, JVD not elevated. Lungs:  Resp regular and unlabored, rales bases Heart: RRR, S1, S2, no S3, S4, or murmur; no rub. Abdomen: Soft, non-tender, slightly distended, BS + x 4.  Extremities: No clubbing, cyanosis, no edema.  Neuro: Alert and oriented X 3. Moves all extremities spontaneously. Psych: Normal affect.  LABS: CBC:  Recent Labs  10/06/13 0346 10/07/13 0445  WBC 9.4 8.8    NEUTROABS 6.6 6.5  HGB 12.1* 11.9*  HCT 36.4* 37.3*  MCV 90.3 93.5  PLT 232 254   INR:  Recent Labs  10/07/13 0445  INR 3.47*   Basic Metabolic Panel:  Recent Labs  10/06/13 0346 10/07/13 0445  NA 143 141  K 4.7 4.5  CL 107 105  CO2 22 18*  GLUCOSE 103* 110*  BUN 41* 43*  CREATININE 4.85* 4.45*  CALCIUM 9.1 9.0   Liver Function Tests:  Recent Labs  10/05/13 0700 10/06/13 0346  AST 16 16  ALT 6 8  ALKPHOS 51 58  BILITOT 0.3 0.3  PROT 6.2 6.5  ALBUMIN 2.6* 2.9*   Cardiac Enzymes:No results found for this basename: CKTOTAL, CKMB, CKMBINDEX, TROPONINI,  in the last 72 hours BNP: Pro B Natriuretic peptide (BNP)  Date/Time Value Ref Range Status  10/01/2013  6:45 PM 17357.0* 0 - 450 pg/mL Final   Thyroid Function Tests:No results found for this basename: TSH, T4TOTAL, FREET3, T3FREE, THYROIDAB,  in the last 72 hours TELE: A pacing, V pacing with intrinsic beats and frequent PVCs, no NSVT seen.  ECHO: 10/02/2013  Study Conclusions: - Left ventricle: Nl size, mild concentric hypertrophy.  Severely reduced Systolic function -- EF ~42-59%, no obviousl regional  WMA.  Grade 2 DD.    Current Medications:  . azithromycin  500 mg Intravenous Q24H  . cefTRIAXone (ROCEPHIN)  IV  1 g Intravenous Q24H  . feeding supplement (ENSURE COMPLETE)  237 mL Oral BID BM  . metoprolol tartrate  25 mg Oral BID  . sodium chloride  3 mL Intravenous Q12H      ASSESSMENT AND PLAN: Active Problems:   Chest pain - resolved and troponin negative x 3. Hold off on ASA until post Urology procedure.     CAP (community acquired pneumonia) - Per IM; remains on IV ABx (appropriately treated given co-morbidities)    AKI (acute kidney injury) - per IM/Urology, nephrostomy tube planned when INR low enough.  Unless he destabilizes - is better to allow INR to drift down.  INR is close to target - could consider FFP if IR will do the procedure today with FFP -- may just need to give IV Lasix with  FFP.      Cardiomyopathy - new dx, EF 20-25% w/ no RWMA, grade 2 diast dysf. BB added, BP tolerating but pt nauseated this am. No ACE/ARB with poor renal function, no diuretic with possible obstructive renal disease. Follow.  No active SSx of acute CHF.    On BB.    Not on ARB/ACE-I due to renal insufficiency; as no active Sx, will hold off on adding afterload reducing agent.  Not on diuretic.  No pulmonary edema signs or significant edema.    With stridor - I do not necessarily hear rales, but will order re-look CXR.    Overanticoagulation - INR 4.68 on admit, then 4.9, trending down, reversal discussed but not given. Per IM    NSVT - seen on telemetry and PPM interrogation previously. None overnight.  Not unexpected with NICM & EF ~20-25%  PPM adjusted yesterday Continue BB.     Leonie Man, M.D., M.S. Interventional Cardiologist   Pager # [phone]

## 2013-10-07 NOTE — Progress Notes (Signed)
[facility] PROGRESS NOTE  Justin Lang. EVO:[id number] DOB: November 07, 1922 DOA: 10/01/2013 PCP: Delphina Cahill, MD  Off Service Summary [phone] who presented with chest pain, found to have likely CAP and ARF with Cr of over 5. On work up, pt was found to have a metastatic lesion from known colon cancer impinge on ureter. Urology following. PCN is planned however pt's INR is markedly elevated. Pt has a hypercoagulable history so our goal is to conservatively allow INR to trend down. Also, pt was found to have new EF of 71% with diastolic dysfunction. Also noted to have brief period of [facility]. Cardiology following. No plans for ICD per family wishes. On beta blocker now. Ultimately, plan to have PCN when INR lower.  Assessment/Plan: 1. Chest pain 1. Serial trop were neg 2. 2d echo with no WMA but with new 69-67% EF with diastolic dysfunction; cardiology on board 3. No further intervention/work up anticipated at this moment 2. 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 Pt appears compensated at this time and no complaining of SOB 2. Presenting BNP was markedly elevated, but question if renal failure is contributing to elevated BNP 3. Given markedly low EF of 20-25%; b-blockers on board 4. TSH was normal 5. Wts have trended as follows: 75kg->76kg->77kg with pos fluid balance, thus IVF have been d/c'd 6. Hold ACEI or arb secondary to renal failure 7. Cardiology following 3. CAP 1. Remains afebrile 2. Tolerating rocephin and azithrymycin 3. No leukocytosis 4. Breathing stable 4. AKI 1. improving slowly; most likely due to hydroureter/hydronephrosis 2. CT abd with enlarged metastatic lesion that appears to be resulting in R ureteral obstruction and hydroureter 3. Appreciate Urology input, with recs for PCN on 9/24 5. Metastatic colon ca 1. Followed by Oncology as outpatient 6. Hx DVT 1. Pt with hypercoagulable state, is s/p IVC filter placement 2. INR supertherapeutic on  presentation, now in therapeutic range 3. No signs of active bleeding 4. Coumadin remains on hold as goal for PCN is around 1.5-1.6 5. Will follow INR and if still not at goal, plan is to give FFP and performed procedure  7. Coumadin coagulopathy 1. On hold; anticipating PCN tube placement on 9/24 8. Wide complex tachycardia 1. Noted during this admit 2. Pt noted to be asymptomatic during this episode 3. Given above dx of EF of 20%, consulted Cardiology 4. Pt currently with pacemaker, not ICD per family wishes 5. Continue b-blocker; cardiology following  Code Status: DNR Family Communication: Pt and wife in room Disposition Plan: Pending   Consultants:  Urology  Cardiology  Procedures:    Antibiotics:  Rocephin 9/17>>>  Azithromycin 9/17>>>  HPI/Subjective: Afebrile, patient is currently complaining of sore throat; no CP  Objective: Filed Vitals:   10/07/13 0007 10/07/13 0431 10/07/13 0438 10/07/13 0919  BP: 135/84 145/66  159/92  Pulse: 78 65  72  Temp: 98.5 F (36.9 C) 97.3 F (36.3 C)    TempSrc: Tympanic Oral    Resp: 20 20    Height:      Weight:  78.971 kg (174 lb 1.6 oz) 78.2 kg (172 lb 6.4 oz)   SpO2: 97% 94%      Intake/Output Summary (Last 24 hours) at 10/07/13 1758 Last data filed at 10/07/13 1749  Gross per 24 hour  Intake    300 ml  Output    750 ml  Net   -450 ml   Filed Weights   10/05/13 0654 10/07/13 0431 10/07/13 0438  Weight: 77.384  kg (170 lb 9.6 oz) 78.971 kg (174 lb 1.6 oz) 78.2 kg (172 lb 6.4 oz)    Exam:   General:  Awake, in nad; complaining of sore throat  Cardiovascular: regular, s1, s2  Respiratory: normal resp effort, no wheezing  Abdomen: soft,nondistended  Musculoskeletal: perfused, no clubbing, trace edema bilaterally  Data Reviewed: Basic Metabolic Panel:  Recent Labs Lab 10/03/13 0403 10/03/13 2250 10/04/13 0825 10/05/13 0700 10/06/13 0346 10/07/13 0445  NA 143  --  146 144 143 141  K 3.2*  --   3.5* 3.5* 4.7 4.5  CL 105  --  109 109 107 105  CO2 19  --  22 20 22  18*  GLUCOSE 124*  --  96 107* 103* 110*  BUN 45*  --  45* 42* 41* 43*  CREATININE 5.37*  --  5.49* 4.99* 4.85* 4.45*  CALCIUM 8.5  --  8.6 8.5 9.1 9.0  MG  --  1.8  --   --   --   --    Liver Function Tests:  Recent Labs Lab 10/02/13 0455 10/03/13 0403 10/04/13 0825 10/05/13 0700 10/06/13 0346  AST 22 14 17 16 16   ALT 5 5 5 6 8   ALKPHOS 61 64 57 51 58  BILITOT 0.4 0.4 0.4 0.3 0.3  PROT 6.3 6.8 6.1 6.2 6.5  ALBUMIN 2.7* 2.9* 2.7* 2.6* 2.9*   CBC:  Recent Labs Lab 10/03/13 0403 10/04/13 0825 10/05/13 0700 10/06/13 0346 10/07/13 0445  WBC 9.4 7.7 7.8 9.4 8.8  NEUTROABS 7.5 5.0 5.3 6.6 6.5  HGB 13.1 11.8* 11.8* 12.1* 11.9*  HCT 38.9* 35.2* 35.1* 36.4* 37.3*  MCV 87.8 90.3 89.1 90.3 93.5  PLT 257 212 233 232 254   Cardiac Enzymes:  Recent Labs Lab 10/01/13 2315 10/02/13 0455 10/02/13 0955  TROPONINI <0.30 <0.30 <0.30   BNP (last 3 results)  Recent Labs  10/01/13 1845  PROBNP 17357.0*   CBG: No results found for this basename: GLUCAP,  in the last 168 hours  Recent Results (from the past 240 hour(s))  CULTURE, BLOOD (ROUTINE X 2)     Status: None   Collection Time    10/01/13 11:08 PM      Result Value Ref Range Status   Specimen Description BLOOD ARM LEFT   Final   Special Requests     Final   Value: BOTTLES DRAWN AEROBIC AND ANAEROBIC 10CC AZITHROMYCIN, CEFTRIAXONE   Culture  Setup Time     Final   Value: 10/02/2013 05:41     Performed at [facility]   Culture     Final   Value:        BLOOD CULTURE RECEIVED NO GROWTH TO DATE CULTURE WILL BE HELD FOR 5 DAYS BEFORE ISSUING A FINAL NEGATIVE REPORT     Performed at [facility]   Report Status PENDING   Incomplete  CULTURE, BLOOD (ROUTINE X 2)     Status: None   Collection Time    10/01/13 11:15 PM      Result Value Ref Range Status   Specimen Description BLOOD HAND LEFT   Final   Special Requests     Final    Value: BOTTLES DRAWN AEROBIC ONLY 10CC AZITHROMYCIN, CEFTRIAXONE   Culture  Setup Time     Final   Value: 10/02/2013 05:40     Performed at [facility]   Culture     Final   Value:  BLOOD CULTURE RECEIVED NO GROWTH TO DATE CULTURE WILL BE HELD FOR 5 DAYS BEFORE ISSUING A FINAL NEGATIVE REPORT     Performed at [facility]   Report Status PENDING   Incomplete  URINE CULTURE     Status: None   Collection Time    10/02/13  7:59 AM      Result Value Ref Range Status   Specimen Description URINE, RANDOM   Final   Special Requests NONE   Final   Culture  Setup Time     Final   Value: 10/02/2013 14:34     Performed at [place]     Final   Value: 6,000 COLONIES/ML     Performed at [facility]   Culture     Final   Value: INSIGNIFICANT GROWTH     Performed at [facility]   Report Status 10/03/2013 FINAL   Final     Studies: Dg Chest Port 1 View  10/07/2013   CLINICAL DATA:  Pneumonia.  Wheezing.  EXAM: PORTABLE CHEST - 1 VIEW  COMPARISON:  CT, 10/02/2013 and chest radiograph, 10/01/2013.  FINDINGS: Cardiac silhouette is normal in size. Normal mediastinal contours. No convincing hilar masses.  Opacity at the right lung base obscures the right hemidiaphragm. Milder opacity is noted at the left lung base. Based on the recent prior CT, this is consistent with right larger than left pleural effusions with associated atelectasis. There is mild central vascular congestion, but no overt pulmonary edema. Lung base opacity appears increased when compared to the prior chest radiograph.  IMPRESSION: 1. Lung base opacity, mildly increased from the prior chest radiograph. Based on the prior CT, this is due to a combination of right greater left pleural effusions with associated atelectasis. Pneumonia is not excluded but felt less likely. Central vascular congestion without evidence of overt pulmonary edema.   Electronically Signed   By:  Lajean Manes M.D.   On: 10/07/2013 10:09   Dg Abd Portable 1v  10/06/2013   CLINICAL DATA:  Abdominal pain  EXAM: PORTABLE ABDOMEN - 1 VIEW  COMPARISON:  CT abdomen 10/02/2013  FINDINGS: There is a bladder calculus along the left side of the bladder. There is right nephrolithiasis. There is no bowel dilatation to suggest obstruction. There is no evidence of pneumoperitoneum, portal venous gas or pneumatosis. There is an IVC filter noted. The osseous structures are unremarkable.  IMPRESSION: No bowel obstruction.  Right nephrolithiasis.  Bladder calculus.   Electronically Signed   By: Kathreen Devoid   On: 10/06/2013 17:49    Scheduled Meds: . azithromycin  500 mg Intravenous Q24H  . cefTRIAXone (ROCEPHIN)  IV  1 g Intravenous Q24H  . feeding supplement (ENSURE COMPLETE)  237 mL Oral BID BM  . metoprolol tartrate  25 mg Oral BID  . sodium chloride  3 mL Intravenous Q12H   Continuous Infusions:    Active Problems:   Chest pain   CAP (community acquired pneumonia)   AKI (acute kidney injury)   Cardiomyopathy  Time spent: > 35 min  [facility] Pager [phone]. If 7PM-7AM, please contact night-coverage at [email or web address], password [facility] 10/07/2013, 5:58 PM  LOS: 6 days

## 2013-10-07 NOTE — Progress Notes (Signed)
Report given to receiving RN. Patient in bed resting with family at bedside. No verbal complaints and no signs or symptoms of distress noted.

## 2013-10-07 NOTE — Evaluation (Signed)
Occupational Therapy Evaluation Patient Details Name: Justin Lang. MRN: [id number] DOB: 11-17-1922 Today's Date: 10/07/2013    History of Present Illness 80 y.o. year old male with significant past medical history of stage 4 prostate cancer, SA node dysfunction s/p pacemaker, hx/o DVT s/p IVC filter on coumadin presenting with with chest pain, , CAP, AKI, cardiomyopathy. Per wife pt has had progressive malaise and weakness. Wife reports that pt has not had chemotherapy or radiation for about 10 months as pt cannot tolerate this. Over the past 1-2 days, pt complaining of chest pain and worsening weakness. Also with mild cough and low back/kidney pain.    Clinical Impression   Pt was independent in ADL with assistive devices prior to admission.  He is now requiring min to moderate assistance and demonstrates decreased balance and activity tolerance.  Pt is anxious to return home.  Wife tearful. Will follow acutely.  Anticipate pt will be able to return home with his supportive family.    Follow Up Recommendations  Home health OT;Supervision/Assistance - 24 hour Home Health Aid   Equipment Recommendations  None recommended by OT    Recommendations for Other Services       Precautions / Restrictions Precautions Precautions: Fall Restrictions Weight Bearing Restrictions: No      Mobility Bed Mobility Overal bed mobility: Needs Assistance Bed Mobility: Sit to Supine       Sit to supine: Min guard   General bed mobility comments: +2 to scoot to [facility] with pad  Transfers Overall transfer level: Needs assistance   Transfers: Stand Pivot Transfers   Stand pivot transfers: Min assist       General transfer comment: stood from chair without arms    Balance Overall balance assessment: Needs assistance Sitting-balance support: Feet supported Sitting balance-Leahy Scale: Good       Standing balance-Leahy Scale: Poor                              ADL  Overall ADL's : Needs assistance/impaired Eating/Feeding: Independent;Sitting   Grooming: Wash/dry hands;Wash/dry face;Set up;Sitting   Upper Body Bathing: Minimal assitance;Sitting   Lower Body Bathing: Moderate assistance;Sit to/from stand   Upper Body Dressing : Minimal assistance;Sitting   Lower Body Dressing: Moderate assistance;Sit to/from stand   Toilet Transfer: Stand-pivot;Minimal assistance                   Vision                     Perception     Praxis      Pertinent Vitals/Pain Pain Assessment: No/denies pain     Hand Dominance Right   Extremity/Trunk Assessment Upper Extremity Assessment Upper Extremity Assessment: Overall WFL for tasks assessed   Lower Extremity Assessment Lower Extremity Assessment: Defer to PT evaluation   Cervical / Trunk Assessment Cervical / Trunk Assessment: Kyphotic   Communication Communication Communication: No difficulties   Cognition Arousal/Alertness: Awake/alert Behavior During Therapy: Impulsive Overall Cognitive Status: History of cognitive impairments - at baseline       Memory: Decreased short-term memory             General Comments       Exercises       Shoulder Instructions      Home Living Family/patient expects to be discharged to:: Private residence Living Arrangements: Spouse/significant other Available Help at Discharge: Family;Available 24 hours/day Type of Home:  House Home Access: Stairs to enter [employer] of Steps: 3 Entrance Stairs-Rails: Right;Left;Can reach both Home Layout: Two level;Able to live on main level with bedroom/bathroom     Bathroom Shower/Tub: Occupational psychologist: Handicapped height Bathroom Accessibility: Yes How Accessible: Accessible via walker Home Equipment: [place] - single point;Walker - 2 wheels;Wheelchair - manual;Shower seat - built in;Bedside commode;Hand held shower head;Grab bars - tub/shower           Prior Functioning/Environment Level of Independence: Independent with assistive device(s)        Comments: amb with cane    OT Diagnosis: Generalized weakness;Cognitive deficits   OT Problem List: Decreased strength;Decreased activity tolerance;Impaired balance (sitting and/or standing);Decreased cognition;Decreased safety awareness;Cardiopulmonary status limiting activity   OT Treatment/Interventions: Self-care/ADL training;Energy conservation;DME and/or AE instruction;Patient/family education    OT Goals(Current goals can be found in the care plan section) Acute Rehab OT Goals Patient Stated Goal: Get stronger and go home OT Goal Formulation: With patient Time For Goal Achievement: 10/14/13 Potential to Achieve Goals: Good ADL Goals Pt Will Perform Grooming: with supervision;sitting Pt Will Perform Upper Body Bathing: with supervision;sitting Pt Will Perform Lower Body Bathing: with min assist;sit to/from stand Pt Will Perform Upper Body Dressing: with supervision;sitting Pt Will Perform Lower Body Dressing: with min assist;sit to/from stand Pt Will Transfer to Toilet: with min guard assist;ambulating;grab bars;bedside commode (over toilet) Pt Will Perform Toileting - Clothing Manipulation and hygiene: with min guard assist;sit to/from stand  OT Frequency: Min 2X/week   Barriers to D/C:            Co-evaluation              End of Session Equipment Utilized During Treatment: Gait belt  Activity Tolerance: Patient limited by fatigue Patient left: in bed;with call bell/phone within reach;with bed alarm set   Time: 9357-0177 OT Time Calculation (min): 33 min Charges:  OT General Charges $OT Visit: 1 Procedure OT Evaluation $Initial OT Evaluation Tier I: 1 Procedure OT Treatments $Self Care/Home Management : 8-22 mins G-Codes:    Malka So 10/07/2013, 11:20 AM [phone]

## 2013-10-07 NOTE — Progress Notes (Signed)
PT. Rested during the night. No s/s of distress noted this am. Son at the bedside. Pt. C/o of sore throat this morning. On call for [facility] notified via text page. On coming RN made aware. RN will continue to monitor for changes. Justin Lang, Katherine Roan

## 2013-10-07 NOTE — Progress Notes (Signed)
ANTICOAGULATION CONSULT NOTE - Follow Up Consult  Pharmacy Consult for Coumadin Indication: DVT Allergies  Allergen Reactions  . Vectibix [Panitumumab] Other (See Comments)    On 08/20/12 during Vectibix infusion at [facility] developed difficulty swallowing, unable answer questions due to lips/throat feeling "funny", confusion, with no affect. Has difficulty getting words out.  . Celecoxib Rash    CELEBREX    Patient Measurements: Height: 5\' 8"  (172.7 cm) Weight: 172 lb 6.4 oz (78.2 kg) (scale c) IBW/kg (Calculated) : 68.4   Vital Signs: Temp: 97.3 F (36.3 C) (09/23 0431) Temp src: Oral (09/23 0431) BP: 159/92 mmHg (09/23 0919) Pulse Rate: 72 (09/23 0919)  Labs:  Recent Labs  10/05/13 0700 10/06/13 0346 10/07/13 0445  HGB 11.8* 12.1* 11.9*  HCT 35.1* 36.4* 37.3*  PLT 233 232 254  LABPROT 36.7* 30.5* 23.2*  INR 3.70* 2.92* 2.06*  CREATININE 4.99* 4.85* 4.45*    Estimated Creatinine Clearance: 11.1 ml/min (by C-G formula based on Cr of 4.45).   Medications:  Prescriptions prior to admission  Medication Sig Dispense Refill  . cholecalciferol (VITAMIN D) 1000 UNITS tablet Take 2,000 Units by mouth at bedtime.       . diphenoxylate-atropine (LOMOTIL) 2.5-0.025 MG per tablet Take 1 tablet by mouth 4 (four) times daily as needed for diarrhea or loose stools.       . mupirocin ointment (BACTROBAN) 2 % Apply 1 application topically 2 (two) times daily.       Marland Kitchen warfarin (COUMADIN) 2 MG tablet Take 1 tablet (2 mg total) by mouth as directed. Taken with 5mg  tablet on Mon, Wed & Fridays.  30 tablet  1  . warfarin (COUMADIN) 5 MG tablet Take 5 mg by mouth daily except 7 mg on Tues/Thurs or as directed.  30 tablet  3   Scheduled:  . azithromycin  500 mg Intravenous Q24H  . cefTRIAXone (ROCEPHIN)  IV  1 g Intravenous Q24H  . feeding supplement (ENSURE COMPLETE)  237 mL Oral BID BM  . metoprolol tartrate  25 mg Oral BID  . sodium chloride  3 mL Intravenous Q12H    Assessment: 80  y.o male w/ hx metastatic colon on warfarin prior to admission for h/o DVT, PE w/ IVC filter.  INR= 2.06 this AM. INR has decreased from several days of >3.0 to within the therapeutic range today after coumadin being held since admission. (last dose taken PTA was on 10/01/13). CBC slight decrease but stable and PLTC wnl. No bleeding reported. Acute kidney injury. RF w/ obstruction of solitary R kidney 2nd mets.  Urologist is awaiting normalization of INR to have IR proceed with right nephrostomy tube placement. Cardiologist notes that unless he destabilizes - is better to allow INR to drift down.   PTA coumadin dose was 5 mg daily except 7mg  every Tues/Thursday with his last dose taken PTA on 10/01/13.     Goal of Therapy:  INR 2-3 Monitor platelets by anticoagulation protocol: Yes   Plan:  Holding coumadin for right nephrostomy tube placement when INR low enough.  Daily PT/INR.   Nicole Cella, RPh Clinical Pharmacist Pager: [phone] 10/07/2013,12:15 PM

## 2013-10-08 DIAGNOSIS — R079 Chest pain, unspecified: Secondary | ICD-10-CM

## 2013-10-08 DIAGNOSIS — C786 Secondary malignant neoplasm of retroperitoneum and peritoneum: Secondary | ICD-10-CM

## 2013-10-08 DIAGNOSIS — Z86718 Personal history of other venous thrombosis and embolism: Secondary | ICD-10-CM

## 2013-10-08 DIAGNOSIS — N1330 Unspecified hydronephrosis: Secondary | ICD-10-CM

## 2013-10-08 DIAGNOSIS — R627 Adult failure to thrive: Secondary | ICD-10-CM

## 2013-10-08 DIAGNOSIS — N138 Other obstructive and reflux uropathy: Secondary | ICD-10-CM | POA: Diagnosis present

## 2013-10-08 DIAGNOSIS — N19 Unspecified kidney failure: Secondary | ICD-10-CM

## 2013-10-08 DIAGNOSIS — F29 Unspecified psychosis not due to a substance or known physiological condition: Secondary | ICD-10-CM

## 2013-10-08 LAB — BASIC METABOLIC PANEL
ANION GAP: 19 — AB (ref 5–15)
ANION GAP: 16 — AB (ref 5–15)
BUN: 47 mg/dL — AB (ref 6–23)
BUN: 48 mg/dL — ABNORMAL HIGH (ref 6–23)
CALCIUM: 9.4 mg/dL (ref 8.4–10.5)
CHLORIDE: 104 meq/L (ref 96–112)
CHLORIDE: 105 meq/L (ref 96–112)
CO2: 18 meq/L — AB (ref 19–32)
CO2: 20 meq/L (ref 19–32)
CREATININE: 4.29 mg/dL — AB (ref 0.50–1.35)
Calcium: 9.1 mg/dL (ref 8.4–10.5)
Creatinine, Ser: 4.24 mg/dL — ABNORMAL HIGH (ref 0.50–1.35)
GFR calc Af Amer: 13 mL/min — ABNORMAL LOW (ref >90)
GFR calc Af Amer: 13 mL/min — ABNORMAL LOW (ref >90)
GFR calc non Af Amer: 11 mL/min — ABNORMAL LOW (ref >90)
GFR calc non Af Amer: 11 mL/min — ABNORMAL LOW (ref >90)
GLUCOSE: 112 mg/dL — AB (ref 70–99)
Glucose, Bld: 112 mg/dL — ABNORMAL HIGH (ref 70–99)
POTASSIUM: 4.1 meq/L (ref 3.7–5.3)
Potassium: 4.2 mEq/L (ref 3.7–5.3)
SODIUM: 141 meq/L (ref 137–147)
Sodium: 141 mEq/L (ref 137–147)

## 2013-10-08 LAB — CBC WITH DIFFERENTIAL/PLATELET
Basophils Absolute: 0 10*3/uL (ref 0.0–0.1)
Basophils Relative: 1 % (ref 0–1)
EOS ABS: 0.2 10*3/uL (ref 0.0–0.7)
EOS PCT: 3 % (ref 0–5)
HCT: 36.2 % — ABNORMAL LOW (ref 39.0–52.0)
Hemoglobin: 11.9 g/dL — ABNORMAL LOW (ref 13.0–17.0)
Lymphocytes Relative: 10 % — ABNORMAL LOW (ref 12–46)
Lymphs Abs: 0.9 10*3/uL (ref 0.7–4.0)
MCH: 29.5 pg (ref 26.0–34.0)
MCHC: 32.9 g/dL (ref 30.0–36.0)
MCV: 89.6 fL (ref 78.0–100.0)
MONOS PCT: 11 % (ref 3–12)
Monocytes Absolute: 0.9 10*3/uL (ref 0.1–1.0)
NEUTROS PCT: 75 % (ref 43–77)
Neutro Abs: 6.7 10*3/uL (ref 1.7–7.7)
PLATELETS: 267 10*3/uL (ref 150–400)
RBC: 4.04 MIL/uL — ABNORMAL LOW (ref 4.22–5.81)
RDW: 15.5 % (ref 11.5–15.5)
WBC: 8.9 10*3/uL (ref 4.0–10.5)

## 2013-10-08 LAB — GLUCOSE, CAPILLARY: Glucose-Capillary: 176 mg/dL — ABNORMAL HIGH (ref 70–99)

## 2013-10-08 LAB — TYPE AND SCREEN
ABO/RH(D): O POS
Antibody Screen: NEGATIVE

## 2013-10-08 LAB — ABO/RH: ABO/RH(D): O POS

## 2013-10-08 LAB — PROTIME-INR
INR: 1.99 — ABNORMAL HIGH (ref 0.00–1.49)
PROTHROMBIN TIME: 22.6 s — AB (ref 11.6–15.2)

## 2013-10-08 LAB — CULTURE, BLOOD (ROUTINE X 2)
CULTURE: NO GROWTH
Culture: NO GROWTH

## 2013-10-08 MED ORDER — CEFAZOLIN SODIUM-DEXTROSE 2-3 GM-% IV SOLR
2.0000 g | INTRAVENOUS | Status: AC
  Filled 2013-10-08: qty 50

## 2013-10-08 MED ORDER — ISOSORB DINITRATE-HYDRALAZINE 20-37.5 MG PO TABS
1.0000 | ORAL_TABLET | Freq: Two times a day (BID) | ORAL | Status: DC
  Administered 2013-10-08: 1 via ORAL
  Filled 2013-10-08 (×2): qty 1

## 2013-10-08 MED ORDER — AMOXICILLIN-POT CLAVULANATE 250-125 MG PO TABS
1.0000 | ORAL_TABLET | Freq: Two times a day (BID) | ORAL | Status: DC
  Administered 2013-10-08 – 2013-10-11 (×7): 1 via ORAL
  Filled 2013-10-08 (×9): qty 1

## 2013-10-08 MED ORDER — HALOPERIDOL LACTATE 5 MG/ML IJ SOLN: 0.5000 mg | Freq: Three times a day (TID) | INTRAMUSCULAR | Status: DC | PRN

## 2013-10-08 MED ORDER — ISOSORB DINITRATE-HYDRALAZINE 20-37.5 MG PO TABS
1.0000 | ORAL_TABLET | Freq: Two times a day (BID) | ORAL | Status: DC
  Administered 2013-10-08 – 2013-10-11 (×6): 1 via ORAL
  Filled 2013-10-08 (×7): qty 1

## 2013-10-08 MED ORDER — SODIUM CHLORIDE 0.9 % IV SOLN
Freq: Once | INTRAVENOUS | Status: AC
  Administered 2013-10-09: 09:00:00 via INTRAVENOUS

## 2013-10-08 MED ORDER — CARVEDILOL 6.25 MG PO TABS
6.2500 mg | ORAL_TABLET | Freq: Two times a day (BID) | ORAL | Status: DC
  Administered 2013-10-08 – 2013-10-11 (×6): 6.25 mg via ORAL
  Filled 2013-10-08 (×8): qty 1

## 2013-10-08 NOTE — Progress Notes (Signed)
Patient Name: Justin Lang. Date of Encounter: 10/08/2013  Active Problems:   Chest pain   CAP (community acquired pneumonia)   AKI (acute kidney injury)   Cardiomyopathy    Patient Profile: 80 yo male w/ hx metastatic colon CA, MDT PPM, DVT on coumadin, PE w/ IVC filter, admitted 09/17 w/ PNA, acute on chronic RF w/ obstruction of solitary R kidney 2nd mets. Cards saw 09/20 for EF newly 20-25% and NSVT   SUBJECTIVE: Patient very confused, is combative at night. Able to answer simple questions, denies chest pain or SOB.  OBJECTIVE Filed Vitals:   10/07/13 2045 10/08/13 0403 10/08/13 0436 10/08/13 0543  BP: 171/82 160/89 168/112 173/97  Pulse: 65 70 76 77  Temp: 97.2 F (36.2 C) 98.3 F (36.8 C)    TempSrc: Oral Oral    Resp: 20 21    Height:      Weight:  171 lb 8.3 oz (77.8 kg)    SpO2: 96% 95% 95%     Intake/Output Summary (Last 24 hours) at 10/08/13 1108 Last data filed at 10/08/13 0924  Gross per 24 hour  Intake    593 ml  Output   1350 ml  Net   -757 ml   Filed Weights   10/07/13 0431 10/07/13 0438 10/08/13 0403  Weight: 174 lb 1.6 oz (78.971 kg) 172 lb 6.4 oz (78.2 kg) 171 lb 8.3 oz (77.8 kg)    PHYSICAL EXAM General: Well developed, well nourished, male in no acute distress. Head: Normocephalic, atraumatic.  Neck: Supple without bruits, JVD at 9 cm. Lungs:  Resp regular and unlabored, rales bases. Heart: RRR, S1, S2, no S3, S4, or murmur; no rub. Abdomen: Soft, non-tender, non-distended, BS + x 4.  Extremities: No clubbing, cyanosis, no edema.  Neuro: Alert and oriented X 1. Moves all extremities spontaneously. Psych: Normal affect.  LABS: CBC: Recent Labs  10/07/13 0445 10/08/13 0610  WBC 8.8 8.9  NEUTROABS 6.5 6.7  HGB 11.9* 11.9*  HCT 37.3* 36.2*  MCV 93.5 89.6  PLT 254 267   INR: Recent Labs  10/08/13 0610  INR 9.56*   Basic Metabolic Panel: Recent Labs  10/07/13 0445 10/08/13 0610  NA 141 141  K 4.5 4.2  CL 105  104  CO2 18* 18*  GLUCOSE 110* 112*  BUN 43* 47*  CREATININE 4.45* 4.29*  CALCIUM 9.0 9.4   Liver Function Tests: Recent Labs  10/06/13 0346  AST 16  ALT 8  ALKPHOS 58  BILITOT 0.3  PROT 6.5  ALBUMIN 2.9*   BNP: Pro B Natriuretic peptide (BNP)  Date/Time Value Ref Range Status  10/01/2013  6:45 PM 17357.0* 0 - 450 pg/mL Final    TELE:  SR, frequent PVCs, rare pairs, no longer runs seen > 24 hr.      Radiology/Studies: Dg Chest Port 1 View 10/07/2013   CLINICAL DATA:  Pneumonia.  Wheezing.  EXAM: PORTABLE CHEST - 1 VIEW  COMPARISON:  CT, 10/02/2013 and chest radiograph, 10/01/2013.  FINDINGS: Cardiac silhouette is normal in size. Normal mediastinal contours. No convincing hilar masses.  Opacity at the right lung base obscures the right hemidiaphragm. Milder opacity is noted at the left lung base. Based on the recent prior CT, this is consistent with right larger than left pleural effusions with associated atelectasis. There is mild central vascular congestion, but no overt pulmonary edema. Lung base opacity appears increased when compared to the prior chest radiograph.  IMPRESSION: 1. Lung  base opacity, mildly increased from the prior chest radiograph. Based on the prior CT, this is due to a combination of right greater left pleural effusions with associated atelectasis. Pneumonia is not excluded but felt less likely. Central vascular congestion without evidence of overt pulmonary edema.   Electronically Signed   By: Lajean Manes M.D.   On: 10/07/2013 10:09   Dg Abd Portable 1v 10/06/2013   CLINICAL DATA:  Abdominal pain  EXAM: PORTABLE ABDOMEN - 1 VIEW  COMPARISON:  CT abdomen 10/02/2013  FINDINGS: There is a bladder calculus along the left side of the bladder. There is right nephrolithiasis. There is no bowel dilatation to suggest obstruction. There is no evidence of pneumoperitoneum, portal venous gas or pneumatosis. There is an IVC filter noted. The osseous structures are  unremarkable.  IMPRESSION: No bowel obstruction.  Right nephrolithiasis.  Bladder calculus.   Electronically Signed   By: Kathreen Devoid   On: 10/06/2013 17:49   Current Medications:  . feeding supplement (ENSURE COMPLETE)  237 mL Oral BID BM  . metoprolol tartrate  25 mg Oral BID  . sodium chloride  3 mL Intravenous Q12H   ASSESSMENT AND PLAN: Active Problems:  Chest pain - resolved and troponin negative x 3. Hold off on ASA until post Urology procedure.   CAP (community acquired pneumonia) - Per IM repeat CXR more consistent w/ effusions IV ABx d/c'd  AKI (acute kidney injury) - per IM/Urology, peak Cr 5.49. Nephrostomy tube planned when INR low enough. Unless he destabilizes - allow INR to drift down.  INR is close to target - could consider FFP if IR will do the procedure today with FFP -- may need IV Lasix with FFP.   Cardiomyopathy - new dx, EF 20-25% w/ no RWMA, grade 2 diast dysf. Follow.  No active SSx of acute CHF.  On BB - BP/HR still elevated, will increase to 50 mg BID  Not on ARB/ACE-I due to renal insufficiency; as no active Sx, will hold off on adding afterload reducing agent.  Not on diuretic. No pulmonary edema signs or significant edema.  Had stridor 09/23 - re-look CXR results above, not much change  Overanticoagulation - INR 4.68 on admit, then 4.9  INR trending down, reversal discussed but not given. Per IM   NSVT - seen on telemetry and PPM interrogation previously. None overnight. Not unexpected with NICM & EF ~20-25%  PPM adjusted 09/23 Continue BB.   Long discussion w/ wife and family regarding options. They were considering taking him home and coming back for nephrostomy tube, but have reconsidered. He is refusing to eat or drink his Ensure, still taking meds and a little liquids. They realize that he would not want a feeding tube.  He NEEDS - better overnight management, as he is combative and family cannot handle him. Will leave to IM, but suggested they  ask about bedtime rx and a sitter. Family unable to stay overnight anymore.   Jonetta Speak , PA-C 11:08 AM 10/08/2013

## 2013-10-08 NOTE — Plan of Care (Signed)
Problem: Phase II Progression Outcomes Goal: Progress activity as tolerated unless otherwise ordered Outcome: Progressing Patient remained A&O x1 this shift.  Became agitated some throughout the night.  Staff able to keep patient calm with redirection.  No acute events occurred this shift.  Will continue to monitor patient condition.

## 2013-10-08 NOTE — Progress Notes (Signed)
Patient ID: Justin Lang., male   DOB: 10/31/1922, 80 y.o.   MRN: [id number] Aware of request for right PCN on pt. PT/INR currently 22.6/1.99. Would like values normalized prior to case - primary team currently allowing values to trend down conservatively. Will cont to monitor.

## 2013-10-08 NOTE — Progress Notes (Signed)
[facility] PROGRESS NOTE  Justin Lang. QIO:[id number] DOB: [birth year] DOA: 10/01/2013 PCP: Delphina Cahill, MD  Off Service Summary [phone] who presented with chest pain, found to have likely CAP and ARF with Cr of over 5. On work up, pt was found to have a metastatic lesion from known colon cancer impinge on ureter. Urology following. PCN is planned however pt's INR is markedly elevated. Pt has a hypercoagulable history so our goal is to conservatively allow INR to trend down. Also, pt was found to have new EF of 01% with diastolic dysfunction. Also noted to have brief period of [facility]. Cardiology following. No plans for ICD per family wishes. On beta blocker now. Ultimately, plan to have PCN when INR lower.  Assessment/Plan: 1. Chest pain 1. Serial trop were neg 2. 2d echo with no WMA but with new 02-72% EF with diastolic dysfunction; cardiology on board 3. No further intervention/work up anticipated at this moment 2. 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 Pt appears compensated at this time and no complaining of SOB 2. Presenting BNP was markedly elevated, but question if renal failure is contributing to elevated BNP 3. Given markedly low EF of 20-25%; b-blockers on board 4. TSH was normal 5. Wts have trended as follows: 75kg->76kg->77kg with pos fluid balance, thus IVF have been d/c'd 6. Hold ACEI or arb secondary to renal failure 7. Cardiology following 3. CAP 1. Remains afebrile 2. Tolerating rocephin and azithrymycin 3. No leukocytosis 4. Breathing stable 4. AKI 1. improving slowly; most likely due to hydroureter/hydronephrosis 2. CT abd with enlarged metastatic lesion that appears to be resulting in R ureteral obstruction and hydroureter 3. Appreciate Urology input, with recs for PCN on 9/25 5. Metastatic colon ca 1. Followed by Oncology as outpatient 6. Hx DVT 1. Pt with hypercoagulable state, is s/p IVC filter placement 2. INR supertherapeutic on  presentation, now in therapeutic range 3. No signs of active bleeding 4. Coumadin remains on hold as goal for PCN is around 1.5-1.6 5. Will follow INR and if still not at goal, plan is to give FFP and performed procedure  7. Coumadin coagulopathy 1. On hold; anticipating PCN tube placement on 9/25 8. Wide complex tachycardia 1. Noted during this admit 2. Pt noted to be asymptomatic during this episode 3. Given above dx of EF of 20%, consulted Cardiology 4. Pt currently with pacemaker, not ICD per family wishes 5. Continue b-blocker; cardiology following  Code Status: DNR Family Communication: Pt and wife in room Disposition Plan: Pending   Consultants:  Urology  Cardiology  Procedures:  PCN tube planned for 9/25  Antibiotics:  Rocephin 9/17>>>9/24  Azithromycin 9/17>>>9/24  augmentin 9/24  HPI/Subjective: Afebrile, no CP and reports no SOB  Objective: Filed Vitals:   10/08/13 0543 10/08/13 1300 10/08/13 1853 10/08/13 2035  BP: 173/97 141/63 121/75 132/53  Pulse: 77 61 68 68  Temp:   97.6 F (36.4 C) 97.7 F (36.5 C)  TempSrc:   Oral Oral  Resp:  20  20  Height:      Weight:      SpO2:  98% 98% 98%    Intake/Output Summary (Last 24 hours) at 10/08/13 2340 Last data filed at 10/08/13 2055  Gross per 24 hour  Intake    343 ml  Output    900 ml  Net   -557 ml   Filed Weights   10/07/13 0431 10/07/13 0438 10/08/13 0403  Weight: 78.971 kg (174 lb 1.6 oz) 78.2  kg (172 lb 6.4 oz) 77.8 kg (171 lb 8.3 oz)    Exam:   General:  Awake, in no distress, no fever  Cardiovascular: regular, s1, s2  Respiratory: normal resp effort, no wheezing  Abdomen: soft,nondistended  Musculoskeletal: perfused, no clubbing, trace edema bilaterally  Data Reviewed: Basic Metabolic Panel:  Recent Labs Lab 10/03/13 2250  10/05/13 0700 10/06/13 0346 10/07/13 0445 10/08/13 0610 10/08/13 1607  NA  --   < > 144 143 141 141 141  K  --   < > 3.5* 4.7 4.5 4.2 4.1  CL   --   < > 109 107 105 104 105  CO2  --   < > 20 22 18* 18* 20  GLUCOSE  --   < > 107* 103* 110* 112* 112*  BUN  --   < > 42* 41* 43* 47* 48*  CREATININE  --   < > 4.99* 4.85* 4.45* 4.29* 4.24*  CALCIUM  --   < > 8.5 9.1 9.0 9.4 9.1  MG 1.8  --   --   --   --   --   --   < > = values in this interval not displayed. Liver Function Tests:  Recent Labs Lab 10/02/13 0455 10/03/13 0403 10/04/13 0825 10/05/13 0700 10/06/13 0346  AST 22 14 17 16 16   ALT 5 5 5 6 8   ALKPHOS 61 64 57 51 58  BILITOT 0.4 0.4 0.4 0.3 0.3  PROT 6.3 6.8 6.1 6.2 6.5  ALBUMIN 2.7* 2.9* 2.7* 2.6* 2.9*   CBC:  Recent Labs Lab 10/04/13 0825 10/05/13 0700 10/06/13 0346 10/07/13 0445 10/08/13 0610  WBC 7.7 7.8 9.4 8.8 8.9  NEUTROABS 5.0 5.3 6.6 6.5 6.7  HGB 11.8* 11.8* 12.1* 11.9* 11.9*  HCT 35.2* 35.1* 36.4* 37.3* 36.2*  MCV 90.3 89.1 90.3 93.5 89.6  PLT 212 233 232 254 267   Cardiac Enzymes:  Recent Labs Lab 10/02/13 0455 10/02/13 0955  TROPONINI <0.30 <0.30   BNP (last 3 results)  Recent Labs  10/01/13 1845  PROBNP 17357.0*   CBG:  Recent Labs Lab 10/08/13 2121  GLUCAP 176*    Recent Results (from the past 240 hour(s))  CULTURE, BLOOD (ROUTINE X 2)     Status: None   Collection Time    10/01/13 11:08 PM      Result Value Ref Range Status   Specimen Description BLOOD ARM LEFT   Final   Special Requests     Final   Value: BOTTLES DRAWN AEROBIC AND ANAEROBIC 10CC AZITHROMYCIN, CEFTRIAXONE   Culture  Setup Time     Final   Value: 10/02/2013 05:41     Performed at [facility]   Culture     Final   Value: NO GROWTH 5 DAYS     Performed at [facility]   Report Status 10/08/2013 FINAL   Final  CULTURE, BLOOD (ROUTINE X 2)     Status: None   Collection Time    10/01/13 11:15 PM      Result Value Ref Range Status   Specimen Description BLOOD HAND LEFT   Final   Special Requests     Final   Value: BOTTLES DRAWN AEROBIC ONLY 10CC AZITHROMYCIN, CEFTRIAXONE    Culture  Setup Time     Final   Value: 10/02/2013 05:40     Performed at [facility]   Culture     Final   Value: NO GROWTH 5 DAYS  Performed at [facility]   Report Status 10/08/2013 FINAL   Final  URINE CULTURE     Status: None   Collection Time    10/02/13  7:59 AM      Result Value Ref Range Status   Specimen Description URINE, RANDOM   Final   Special Requests NONE   Final   Culture  Setup Time     Final   Value: 10/02/2013 14:34     Performed at [facility]     Final   Value: 6,000 COLONIES/ML     Performed at [facility]   Culture     Final   Value: INSIGNIFICANT GROWTH     Performed at [facility]   Report Status 10/03/2013 FINAL   Final     Studies: Dg Chest Port 1 View  10/07/2013   CLINICAL DATA:  Pneumonia.  Wheezing.  EXAM: PORTABLE CHEST - 1 VIEW  COMPARISON:  CT, 10/02/2013 and chest radiograph, 10/01/2013.  FINDINGS: Cardiac silhouette is normal in size. Normal mediastinal contours. No convincing hilar masses.  Opacity at the right lung base obscures the right hemidiaphragm. Milder opacity is noted at the left lung base. Based on the recent prior CT, this is consistent with right larger than left pleural effusions with associated atelectasis. There is mild central vascular congestion, but no overt pulmonary edema. Lung base opacity appears increased when compared to the prior chest radiograph.  IMPRESSION: 1. Lung base opacity, mildly increased from the prior chest radiograph. Based on the prior CT, this is due to a combination of right greater left pleural effusions with associated atelectasis. Pneumonia is not excluded but felt less likely. Central vascular congestion without evidence of overt pulmonary edema.   Electronically Signed   By: Lajean Manes M.D.   On: 10/07/2013 10:09    Scheduled Meds: . sodium chloride   Intravenous Once  . amoxicillin-clavulanate  1 tablet Oral Q12H  . carvedilol  6.25 mg  Oral BID WC  . [START ON 10/09/2013]  ceFAZolin (ANCEF) IV  2 g Intravenous On Call  . feeding supplement (ENSURE COMPLETE)  237 mL Oral BID BM  . isosorbide-hydrALAZINE  1 tablet Oral BID  . sodium chloride  3 mL Intravenous Q12H   Continuous Infusions:    Principal Problem:   CAP (community acquired pneumonia) Active Problems:   COLON CANCER, HX OF   DVT (deep venous thrombosis), unspecified laterality   Chest pain   AKI (acute kidney injury)   Cardiomyopathy   Obstructive nephropathy = external ureteral obstruction  Time spent: > 35 min  Barton Dubois  [facility] Pager [phone]. If 7PM-7AM, please contact night-coverage at [email or web address], password [facility] 10/08/2013, 11:40 PM  LOS: 7 days

## 2013-10-08 NOTE — Progress Notes (Signed)
Pt seen & examined - both PA-s with notes.  See details on Note initiated by Rosaria Ferries, PA-C.

## 2013-10-08 NOTE — Progress Notes (Signed)
Patient alert to self, family at bedside assisting patient with care, IR spoke with family regarding questions about procedure, all questions answered, family stated they were better now that they have answers to their questions, will continue to monitor

## 2013-10-08 NOTE — Progress Notes (Addendum)
I have seen and evaluated the patient this PM  along with Rosaria Ferries, PA-C. I agree with her findings, examination as well as impression recommendations.  Principal Problem:   CAP (community acquired pneumonia) Active Problems:   Obstructive nephropathy = external ureteral obstruction   COLON CANCER, HX OF   Chest pain   AKI (acute kidney injury)   Cardiomyopathy   DVT (deep venous thrombosis), unspecified laterality  Major issue is how slowly the INR has come down -- needs nephrostomy tube; after d/w radiology, concern is that hemorrhage peri-procedure would be catastrophic.    Only active cardiac issue with NICM, PPM is that his BPs are elevated.    Convert BB to Carvedilol (better for CM & BP)  Add bidil (hold for SBP < 120 mmHg)  PPM adjusted by [employer] rep - should be functioning better.  Having problems with sundowning -- IM to address, but expediting Nephrostomy tube placement becomes vital. -   Initial thought was to allow INR to drift down (probably slower than expected with Abx interaction) -- retrospectively, PO Vit K may have been reasonable.  Would NOT bridge with INR < 2 due to planned invasive procedure  Thankfully renal function is trending in the appropriate direction.   Leonie Man, M.D., M.S. Interventional Cardiologist   Pager # [phone]

## 2013-10-08 NOTE — Progress Notes (Signed)
IP PROGRESS NOTE  Subjective:   Justin Lang is known to me with a history of metastatic colon cancer. He presented to the emergency room 10/01/2013 with failure to thrive. He was found to have acute renal failure. A CT 10/02/2013 revealed bilateral pleural effusions, and an increased peritoneal metastasis at the right lower abdomen causing obstruction of the right ureter.  He was diagnosed with pneumonia and cardiomyopathy. Plans for a nephrostomy tube were placed on hold secondary to a supratherapeutic PT/INR.  Justin Lang is confused this morning. His wife is at the bedside. She acknowledges his confusion and reports the had developed malaise prior to hospital admission.  Objective: Vital signs in last 24 hours: Blood pressure 173/97, pulse 77, temperature 98.3 F (36.8 C), temperature source Oral, resp. rate 21, height $RemoveBe'5\' 8"'bWlMuYgza$  (1.727 m), weight 171 lb 8.3 oz (77.8 kg), SpO2 95.00%.  Intake/Output from previous day: 09/23 0701 - 09/24 0700 In: 653 [P.O.:300; I.V.:3; IV Piggyback:350] Out: 650 [Urine:650]  Physical Exam:  HEENT: No thrush, pharynx without exudate Lungs: Decreased breath sounds with inspiratory rhonchi at the right greater than left lower posterior chest, no respiratory distress Cardiac: Irregular Abdomen: Soft and nontender, no hepatomegaly Extremities: No leg edema Neurologic: Alert, follows commands, not oriented   Lab Results:  Recent Labs  10/07/13 0445 10/08/13 0610  WBC 8.8 8.9  HGB 11.9* 11.9*  HCT 37.3* 36.2*  PLT 254 267    BMET  Recent Labs  10/07/13 0445 10/08/13 0610  NA 141 141  K 4.5 4.2  CL 105 104  CO2 18* 18*  GLUCOSE 110* 112*  BUN 43* 47*  CREATININE 4.45* 4.29*  CALCIUM 9.0 9.4    Studies/Results: Dg Chest Port 1 View  10/07/2013   CLINICAL DATA:  Pneumonia.  Wheezing.  EXAM: PORTABLE CHEST - 1 VIEW  COMPARISON:  CT, 10/02/2013 and chest radiograph, 10/01/2013.  FINDINGS: Cardiac silhouette is normal in size. Normal  mediastinal contours. No convincing hilar masses.  Opacity at the right lung base obscures the right hemidiaphragm. Milder opacity is noted at the left lung base. Based on the recent prior CT, this is consistent with right larger than left pleural effusions with associated atelectasis. There is mild central vascular congestion, but no overt pulmonary edema. Lung base opacity appears increased when compared to the prior chest radiograph.  IMPRESSION: 1. Lung base opacity, mildly increased from the prior chest radiograph. Based on the prior CT, this is due to a combination of right greater left pleural effusions with associated atelectasis. Pneumonia is not excluded but felt less likely. Central vascular congestion without evidence of overt pulmonary edema.   Electronically Signed   By: Lajean Manes M.D.   On: 10/07/2013 10:09   Dg Abd Portable 1v  10/06/2013   CLINICAL DATA:  Abdominal pain  EXAM: PORTABLE ABDOMEN - 1 VIEW  COMPARISON:  CT abdomen 10/02/2013  FINDINGS: There is a bladder calculus along the left side of the bladder. There is right nephrolithiasis. There is no bowel dilatation to suggest obstruction. There is no evidence of pneumoperitoneum, portal venous gas or pneumatosis. There is an IVC filter noted. The osseous structures are unremarkable.  IMPRESSION: No bowel obstruction.  Right nephrolithiasis.  Bladder calculus.   Electronically Signed   By: Kathreen Devoid   On: 10/06/2013 17:49    Medications: I have reviewed the patient's current medications.  Assessment/Plan:  1. Metastatic colorectal cancer, status post resection of a cecum/appendiceal mass in November 2013 (T4 N0), K-ras wild-type  -  Biopsy of a peritoneal nodule 07/04/2012 confirmed metastatic colorectal cancer  -cycle 1 panitumumab 08/20/2012  -CT abdomen/pelvis 03/16/2013 without evidence of progressive colon cancer  -CT abdomen/pelvis 10/02/2013 with an increased peritoneal masses causing right ureter obstruction 2.  Sigmoid colon cancer in 1997, status post a partial course of adjuvant chemotherapy (? 5-FU/leucovorin), discontinued secondary to a cardiovascular event  3. Resection of a periaortic lymph node in 2002 with the pathology confirming a moderately differentiated carcinoma  4. History of a DVT/pulmonary embolism, status post IVC filter placement, maintained on Coumadin , followed at the [facility]  5. Arrhythmia, status post pacemaker placement , followed by cardiology 6. cardiomyopathy-EF 20-25% on echocardiogram 10/02/2013 7. acute renal failure secondary to obstructive nephropathy 8. Altered mental status secondary to hospital delirium and renal failure  Justin Lang has metastatic colon cancer and multiple comorbid conditions. I discussed the 10/02/2013 CT findings with his wife. The cancer is causing acute renal failure. I agree with placement of a nephrostomy tube/stent for palliation. He is not a candidate for further systemic therapy. I recommend a Hospice referral.   Coumadin anticoagulation can be resumed pending his clinical status at discharge and he can be followed at the [facility] for management of the PT/INR.   Recommendations:  1.nephrostomy tube/stent for management of the acute renal failure  2.resume anticoagulation following the stent procedure with close followup of the PT/INR while on antibiotics 3.management of CHF/arrhythmia per cardiology  4.[facility] hospice referral for home care    Please call oncology as needed. I will arrange for outpatient followup.      LOS: 7 days   Harriett Azar  10/08/2013, 2:17 PM

## 2013-10-08 NOTE — Progress Notes (Signed)
Patient Profile: 80 yo male w/ hx metastatic colon CA, MDT PPM, DVT on coumadin, PE w/ IVC filter, admitted 09/17 w/ PNA, acute on chronic RF w/ obstruction of solitary R kidney 2nd mets. Cards saw 09/20 for newly decreased EF at 20-25% and NSVT   Subjective: Denies chest pain and dyspnea.   Objective: Vital signs in last 24 hours: Temp:  [97.2 F (36.2 C)-98.3 F (36.8 C)] 98.3 F (36.8 C) (09/24 0403) Pulse Rate:  [65-77] 77 (09/24 0543) Resp:  [20-21] 21 (09/24 0403) BP: (160-173)/(82-112) 173/97 mmHg (09/24 0543) SpO2:  [95 %-96 %] 95 % (09/24 0436) Weight:  [171 lb 8.3 oz (77.8 kg)] 171 lb 8.3 oz (77.8 kg) (09/24 0403) Last BM Date: 10/07/13  Intake/Output from previous day: 09/23 0701 - 09/24 0700 In: 653 [P.O.:300; I.V.:3; IV Piggyback:350] Out: 650 [Urine:650] Intake/Output this shift: Total I/O In: -  Out: 700 [Urine:700]  Medications Current Facility-Administered Medications  Medication Dose Route Frequency Provider Last Rate Last Dose  . feeding supplement (ENSURE COMPLETE) (ENSURE COMPLETE) liquid 237 mL  237 mL Oral BID BM Donne Hazel, MD   237 mL at 10/07/13 0919  . hydrALAZINE (APRESOLINE) injection 10 mg  10 mg Intravenous Q6H PRN Dianne Dun, NP   10 mg at 10/08/13 0451  . metoprolol tartrate (LOPRESSOR) tablet 25 mg  25 mg Oral BID Donne Hazel, MD   25 mg at 10/07/13 2159  . morphine 2 MG/ML injection 2 mg  2 mg Intravenous Q4H PRN Donne Hazel, MD   2 mg at 10/07/13 0919  . ondansetron (ZOFRAN) injection 4 mg  4 mg Intravenous Q6H PRN Donne Hazel, MD   4 mg at 10/06/13 1502  . phenol (CHLORASEPTIC) mouth spray 1 spray  1 spray Mouth/Throat PRN Barton Dubois, MD      . sodium chloride 0.9 % injection 3 mL  3 mL Intravenous Q12H Shanda Howells, MD   3 mL at 10/07/13 2200    PE: General appearance: alert, cooperative and no distress Neck: no JVD Lungs: clear to auscultation bilaterally Heart: regular rate and rhythm Extremities: no  LEE Pulses: 2+ and symmetric Skin: warm and dry Neurologic: Grossly normal  Lab Results:   Recent Labs  10/06/13 0346 10/07/13 0445 10/08/13 0610  WBC 9.4 8.8 8.9  HGB 12.1* 11.9* 11.9*  HCT 36.4* 37.3* 36.2*  PLT 232 254 267   BMET  Recent Labs  10/06/13 0346 10/07/13 0445 10/08/13 0610  NA 143 141 141  K 4.7 4.5 4.2  CL 107 105 104  CO2 22 18* 18*  GLUCOSE 103* 110* 112*  BUN 41* 43* 47*  CREATININE 4.85* 4.45* 4.29*  CALCIUM 9.1 9.0 9.4   PT/INR  Recent Labs  10/06/13 0346 10/07/13 0445 10/08/13 0610  LABPROT 30.5* 23.2* 22.6*  INR 2.92* 2.06* 1.99*     Assessment/Plan  Active Problems:   Chest pain   CAP (community acquired pneumonia)   AKI (acute kidney injury)   Cardiomyopathy  1. Chest Pain: denies any further pain. Enzymes have been negative.   2. Cardiomyopathy: EF 20-25%. No signs of acute decompensation. Currently on BB therapy with Lopressor. ? If either Toprol-XL or Coreg would be a better option. No ACE/ARB therapy secondary to renal insufficiency. BP is elevated in the 431V systolic. However, renal dysfunction may also be contributing to HTN. If BP is still significantly elevated post urology procedure, may consider adding PO hydralazine + nitrate for afterload reduction in place  of ACE.    3. NSVT: PVCs  but no runs of NSVT captured on telemetry overnight. Continue BB.  4. AKI: Scr 4.29. Plan is for PCN by urology once INR is low enough. INR today is 1.99. Can consider FFP if INR needs to be lower.   5. Elevated INR: Continues to trend downward. Was 4.68 on admit. Now sub therapeutic at 1.99.   6. CAP: continue treatment per IM.     LOS: 7 days    Brendaly Townsel M. Rosita Fire, PA-C 10/08/2013 10:35 AM

## 2013-10-08 NOTE — Progress Notes (Signed)
UR completed Jarryd Gratz K. Yahel Fuston, RN, BSN, [place], CCM  10/08/2013 11:44 AM

## 2013-10-08 NOTE — Progress Notes (Signed)
Physical Therapy Treatment Patient Details Name: Justin Lang. MRN: [id number] DOB: [birth year] Today's Date: 10/08/2013    History of Present Illness 80 y.o. year old male with significant past medical history of stage 4 prostate cancer, SA node dysfunction s/p pacemaker, hx/o DVT s/p IVC filter on coumadin presenting with with chest pain, , CAP, AKI, cardiomyopathy. Per wife pt has had progressive malaise and weakness. Wife reports that pt has not had chemotherapy or radiation for about 10 months as pt cannot tolerate this. Over the past 1-2 days, pt complaining of chest pain and worsening weakness. Also with mild cough and low back/kidney pain.     PT Comments    Focus of session was on therapeutic exercise. Pt with difficulty standing from low recliner chair and required mod assist today. Discussed HEP with son, and wrote on dry-erase board 3 exercises to encourage throughout the day. Son states he feels some sundowning is coming on, and mobility was limited to standing at the chair. If pt is not able to progress with mobility and safety with transfers, post-acute rehab may be more appropriate at d/c. Encouraged increasing walking distance to [facility] instead of having pt performing SPT only, with staff for safety. Will continue to follow.   Follow Up Recommendations  Home health PT     Equipment Recommendations  None recommended by PT    Recommendations for Other Services       Precautions / Restrictions Precautions Precautions: Fall Restrictions Weight Bearing Restrictions: No    Mobility  Bed Mobility               General bed mobility comments: Pt received sitting up in chair.  Transfers Overall transfer level: Needs assistance Equipment used: Rolling walker (2 wheeled) Transfers: Sit to/from Stand Sit to Stand: Mod assist         General transfer comment: Pt required multiple attempts and mod assist from therapist to achieve full standing position. Achieved  sit<>stand x2.   Ambulation/Gait             General Gait Details: Deferred at this time as focus of session was on therapeutic exercise.    Stairs            Wheelchair Mobility    Modified Rankin (Stroke Patients Only)       Balance Overall balance assessment: Needs assistance Sitting-balance support: Feet supported;No upper extremity supported Sitting balance-Leahy Scale: Fair     Standing balance support: Bilateral upper extremity supported;During functional activity Standing balance-Leahy Scale: Poor Standing balance comment: Pt requires UE support to maintain standing balance. Leans posteriorly.                     Cognition Arousal/Alertness: Awake/alert Behavior During Therapy: Impulsive Overall Cognitive Status: History of cognitive impairments - at baseline       Memory: Decreased short-term memory              Exercises General Exercises - Lower Extremity Long Arc Quad: 10 reps Hip ABduction/ADduction: 10 reps Straight Leg Raises: 10 reps Hip Flexion/Marching: 10 reps Heel Raises: 10 reps    General Comments General comments (skin integrity, edema, etc.): Discussed how pt was doing cognitively today with son who was present throughout session. Son states that pt is taking 15-20 minute naps throughout the day and night, however has not slept a full night in ~5 days.      Pertinent Vitals/Pain Pain Assessment: No/denies pain    Home  Living                      Prior Function            PT Goals (current goals can now be found in the care plan section) Acute Rehab PT Goals Patient Stated Goal: Get stronger and go home PT Goal Formulation: With patient/family Time For Goal Achievement: 10/20/13 Potential to Achieve Goals: Good Progress towards PT goals: Progressing toward goals    Frequency  Min 3X/week    PT Plan Current plan remains appropriate    Co-evaluation             End of Session Equipment  Utilized During Treatment: Gait belt Activity Tolerance: Patient tolerated treatment well Patient left: in chair;with chair alarm set;with family/visitor present;with call bell/phone within reach     Time: 9563-8756 PT Time Calculation (min): 28 min  Charges:  $Therapeutic Exercise: 23-37 mins                    G Codes:      Jolyn Lent 2013/10/20, 5:11 PM  Jolyn Lent, PT, DPT Acute Rehabilitation Services Pager: [phone]

## 2013-10-08 NOTE — Consult Note (Signed)
Reason for consult: right nephrostomy tube placement  Referring Physician(s): Drs. Madera/Grapey  History of Present Illness: Justin Lang. is a 80 y.o. male with complicated medical history most significantly including recurrent metastatic colorectal cancer now with pleural effusion and acute on chronic renal failure from obstruction of his functionally solitary right kidney due to enlarging peritoneal metastases. His clon cancer history dates back to 1997 when he had a sigmoid colectomy and chemotherapy. He then had a resection of a periaortic lymph node in 2002 consistent with metastatic disease, resection of cecal/appendiceal mass in 11/2011, and positive peritoneal mets diagnosed in 06/2012. He was started on chemotherapy in 08/2012 and was noted to have small residual peritoneal masses without progressive disease as of 03/2013 and no hydronephrosis. He has a history of chronic renal failure with atrophic kidneys bilaterally (L>R) and bilateral renal stones. His baseline Cr as of 12/2012 was 1.7. He also has had a chronic bladder stone noted for at least several years. The patient also has a history of a pulmonary embolism for which he has an IVC filter. He is on chronic coumadin therapy (on hold now). He also has cardiomyopathy, and SA node dysfunction managed with a pace maker. Most recent creatinine  is 4.29, PT 22.6/INR 1.99. The pt's MS seems to be worsening. He currently denies CP, N/V, bleeding or abd pain. He has chronic back pain. Request now received for a right percutaneous nephrostomy. Imaging studies have been reviewed by Dr. Earleen Newport.   Past Medical History  Diagnosis Date  . DVT (deep venous thrombosis)     hx of s/p [place] filter  . OAB (overactive bladder)   . Arthritis   . Cataracts, bilateral   . FH: factor V Leiden deficiency   . History of nephrolithiasis   . History of ankle fracture   . 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  . Peripheral  neuropathy   . Vitamin B12 deficiency   . Symptomatic bradycardia   . Sinoatrial node dysfunction   . Pacemaker 4 yrs ago  . Colon cancer 07/23/00    hx of metastic- treated with surgery and readiation  . Allergy     celebrex  . History of radiation therapy 09/02/00-10/07/00    recurrent colon cancer/left periaortic nodal recurrence=4500cGy/25 fxs,Dr.Wu  . Hx SBO     multiple intermittent and surgery  . History of chemotherapy     Past Surgical History  Procedure Laterality Date  . Colectomy      sigmoid secondary to colon ca- 1997  . Exploratory laparotomy      metastatic retroperitoneal lymph node- 2002  . Total knee arthroplasty      right-1991; left-1992  . Back surgeries      x3  . Filter replacement    . Left ankle surgery    . Incisional herniorraphy    . Pacemaker insertion  05/19/2008  . Replacement total knee bilateral    . Colon resection    . Colonoscopy  08/18/2010    Procedure: COLONOSCOPY;  Surgeon: Rogene Houston, MD;  Location: [facility];  Service: Endoscopy;  Laterality: N/A;  . Esophagogastroduodenoscopy  08/18/2010    Procedure: ESOPHAGOGASTRODUODENOSCOPY (EGD);  Surgeon: Rogene Houston, MD;  Location: [facility];  Service: Endoscopy;  Laterality: N/A;  . Partial colectomy  11/28/2011    Procedure: PARTIAL COLECTOMY;  Surgeon: Jamesetta So, MD;  Location: [facility];  Service: General;  Laterality: N/A;  . Laparoscopic  ileocecectomy  11/28/11  . Appendectomy      Allergies: Vectibix and Celecoxib  Medications: Prior to Admission medications   Medication Sig Start Date End Date Taking? Authorizing Provider  cholecalciferol (VITAMIN D) 1000 UNITS tablet Take 2,000 Units by mouth at bedtime.    Yes Historical Provider, MD  diphenoxylate-atropine (LOMOTIL) 2.5-0.025 MG per tablet Take 1 tablet by mouth 4 (four) times daily as needed for diarrhea or loose stools.    Yes Historical Provider, MD  mupirocin ointment (BACTROBAN) 2 % Apply 1 application  topically 2 (two) times daily.  09/16/13  Yes Historical Provider, MD  warfarin (COUMADIN) 2 MG tablet Take 1 tablet (2 mg total) by mouth as directed. Taken with 5mg  tablet on Mon, [place] & Fridays. 08/06/13  Yes Ladell Pier, MD  warfarin (COUMADIN) 5 MG tablet Take 5 mg by mouth daily except 7 mg on Tues/Thurs or as directed. 08/10/13  Yes Curt Bears, MD    Family History  Problem Relation Age of Onset  . Anesthesia problems Neg Hx   . Hypotension Neg Hx   . Malignant hyperthermia Neg Hx   . Pseudochol deficiency Neg Hx   . Cancer Sister     History   Social History  . Marital Status: Married    Spouse Name: N/A    Number of Children: 6  . Years of Education: N/A   Social History Main Topics  . Smoking status: Never Smoker   . Smokeless tobacco: Never Used  . Alcohol Use: No  . Drug Use: No  . Sexual Activity: No   Other Topics Concern  . None   Social History Narrative   Married, lives with spouse.         Review of Systems see above in H&P  Vital Signs: BP 173/97  Pulse 77  Temp(Src) 98.3 F (36.8 C) (Oral)  Resp 21  Ht 5\' 8"  (1.727 m)  Wt 171 lb 8.3 oz (77.8 kg)  BMI 26.09 kg/m2  SpO2 95%  Physical Exam  Constitutional: He appears well-developed and well-nourished.  Cardiovascular: Normal rate and regular rhythm.   Pulmonary/Chest: Effort normal.  Dim BS bases  Abdominal: Soft. Bowel sounds are normal. There is no tenderness.  Neurological:  Pt confused, knows date of birth but not location or year; very fidgety    Imaging: Ct Abdomen Pelvis Wo Contrast  10/02/2013   CLINICAL DATA:  Diffuse abdominal pain and chest pain. Assess for malignancy.  EXAM: CT CHEST, ABDOMEN AND PELVIS WITHOUT CONTRAST  TECHNIQUE: Multidetector CT imaging of the chest, abdomen and pelvis was performed following the standard protocol without IV contrast.  COMPARISON:  CT of the abdomen and pelvis performed 03/16/2013, and CT of the chest performed 08/08/2012  FINDINGS:  CT CHEST FINDINGS  Small to moderate right and small left pleural effusions are seen. Patchy bilateral airspace opacities are noted, with underlying interstitial prominence, concerning for pulmonary edema. No pneumothorax is seen. No masses are identified, though evaluation is limited given airspace opacification.  Diffuse coronary artery calcifications are seen. A mildly enlarged right paratracheal node is seen, measuring 1.4 cm in short axis. No definite hilar lymphadenopathy is appreciated. The great vessels are grossly unremarkable in appearance. No pericardial effusion is identified. A pacemaker lead is ending at the right ventricle; the pacemaker is seen at the anterior left chest wall.  The thyroid gland is unremarkable. No axillary lymphadenopathy is seen.  No acute osseous abnormalities are identified.  CT ABDOMEN AND PELVIS FINDINGS  A few hypodensities are noted within the liver, measuring up to 2.1 cm in size. These are relatively stable from the prior study and likely benign. A small calcified granuloma is noted near the hepatic hilum. The spleen is unremarkable in appearance. The patient is status post cholecystectomy, with clips noted at the gallbladder fossa. The pancreas and adrenal glands are unremarkable.  The previously noted peritoneal metastasis at the right lower quadrant has increased in size, now measuring approximately 2.2 x 1.9 cm. This obscures the right ureter at this level, with resultant dilatation of the right ureter and relatively severe right-sided hydronephrosis.  A nonobstructing 6 mm stone is noted within the dilated right ureter, proximal to the mass. There is severe left-sided renal atrophy, with several stones noted at the left renal pelvis, measuring up to 8 mm in size.  No free fluid is identified. Postoperative change is noted about the proximal small bowel. The visualized small bowel is otherwise unremarkable. The stomach is within normal limits. No acute vascular  abnormalities are seen. The IVC filter is noted in expected position. Scattered calcification is seen along the abdominal aorta and its branches.  The patient is status post appendectomy. Scattered diverticulosis is noted along the descending and sigmoid colon, without evidence of diverticulitis. Contrast progresses to the level of the rectum. Postoperative change is noted at the proximal sigmoid colon.  The bladder is mildly distended. A prominent bladder calculus is again noted. There is nodular extension of the prostate at the base of the bladder, relatively stable from prior studies. The prostate is enlarged, measuring 5.5 cm in transverse dimension. No inguinal lymphadenopathy is seen.  No acute osseous abnormalities are identified. Facet disease is noted at the lower lumbar spine.  IMPRESSION: 1. Small to moderate right and small left pleural effusions seen. Patchy bilateral airspace opacification, with underlying interstitial prominence, compatible with pulmonary edema. 2. Peritoneal metastasis at the right lower quadrant has increased in size, now measuring 2.2 x 1.9 cm, versus 1.6 x 1.0 cm on the prior study. This now obscures the right ureter at this level, with resultant dilatation of the right ureter and relatively severe right-sided hydronephrosis. Given the patient's left renal atrophy, this would explain the patient's acute renal failure. 3. Nonobstructing 6 mm stone within the dilated right ureter, proximal to the mass. 4. Severe left-sided renal atrophy, with several stones at the left renal pelvis. 5. Mildly enlarged right paratracheal node measures 1.4 cm in short axis; this is nonspecific in nature. It appears increased in size from 2014. 6. Scattered diverticulosis along the descending and sigmoid colon, without evidence of diverticulitis. 7. Nodular extension of the prostate at the base of the bladder, stable from prior studies. Enlarged prostate seen. 8. Prominent bladder calculus again noted.  9. Diffuse coronary artery calcifications seen. 10. A few hypodensities within the liver, measuring up to 2.1 cm in size, appear relatively stable and may reflect cysts. 11. Scattered calcification along the abdominal aorta and its branches.  These results were called by telephone at the time of interpretation on 10/02/2013 at 5:38 am to [facility] on [facility], who verbally acknowledged these results.   Electronically Signed   By: Garald Balding M.D.   On: 10/02/2013 05:41   Dg Chest 2 View  10/01/2013   CLINICAL DATA:  Chest pain and shortness of breath  EXAM: CHEST  2 VIEW  COMPARISON:  08/20/2012 and prior radiographs  FINDINGS: Cardiomegaly and left-sided pacemaker again noted.  Left lower lobe airspace disease/  consolidation likely represents pneumonia.  There is no evidence of pleural effusion, pneumothorax or pulmonary edema.  No acute bony abnormalities are noted.  IMPRESSION: Left lower lobe airspace disease/consolidation likely representing pneumonia. Radiographic follow-up to resolution is recommended.  Cardiomegaly.   Electronically Signed   By: Hassan Rowan M.D.   On: 10/01/2013 20:55   Ct Chest Wo Contrast  10/02/2013   CLINICAL DATA:  Diffuse abdominal pain and chest pain. Assess for malignancy.  EXAM: CT CHEST, ABDOMEN AND PELVIS WITHOUT CONTRAST  TECHNIQUE: Multidetector CT imaging of the chest, abdomen and pelvis was performed following the standard protocol without IV contrast.  COMPARISON:  CT of the abdomen and pelvis performed 03/16/2013, and CT of the chest performed 08/08/2012  FINDINGS: CT CHEST FINDINGS  Small to moderate right and small left pleural effusions are seen. Patchy bilateral airspace opacities are noted, with underlying interstitial prominence, concerning for pulmonary edema. No pneumothorax is seen. No masses are identified, though evaluation is limited given airspace opacification.  Diffuse coronary artery calcifications are seen. A mildly enlarged right paratracheal node is  seen, measuring 1.4 cm in short axis. No definite hilar lymphadenopathy is appreciated. The great vessels are grossly unremarkable in appearance. No pericardial effusion is identified. A pacemaker lead is ending at the right ventricle; the pacemaker is seen at the anterior left chest wall.  The thyroid gland is unremarkable. No axillary lymphadenopathy is seen.  No acute osseous abnormalities are identified.  CT ABDOMEN AND PELVIS FINDINGS  A few hypodensities are noted within the liver, measuring up to 2.1 cm in size. These are relatively stable from the prior study and likely benign. A small calcified granuloma is noted near the hepatic hilum. The spleen is unremarkable in appearance. The patient is status post cholecystectomy, with clips noted at the gallbladder fossa. The pancreas and adrenal glands are unremarkable.  The previously noted peritoneal metastasis at the right lower quadrant has increased in size, now measuring approximately 2.2 x 1.9 cm. This obscures the right ureter at this level, with resultant dilatation of the right ureter and relatively severe right-sided hydronephrosis.  A nonobstructing 6 mm stone is noted within the dilated right ureter, proximal to the mass. There is severe left-sided renal atrophy, with several stones noted at the left renal pelvis, measuring up to 8 mm in size.  No free fluid is identified. Postoperative change is noted about the proximal small bowel. The visualized small bowel is otherwise unremarkable. The stomach is within normal limits. No acute vascular abnormalities are seen. The IVC filter is noted in expected position. Scattered calcification is seen along the abdominal aorta and its branches.  The patient is status post appendectomy. Scattered diverticulosis is noted along the descending and sigmoid colon, without evidence of diverticulitis. Contrast progresses to the level of the rectum. Postoperative change is noted at the proximal sigmoid colon.  The bladder  is mildly distended. A prominent bladder calculus is again noted. There is nodular extension of the prostate at the base of the bladder, relatively stable from prior studies. The prostate is enlarged, measuring 5.5 cm in transverse dimension. No inguinal lymphadenopathy is seen.  No acute osseous abnormalities are identified. Facet disease is noted at the lower lumbar spine.  IMPRESSION: 1. Small to moderate right and small left pleural effusions seen. Patchy bilateral airspace opacification, with underlying interstitial prominence, compatible with pulmonary edema. 2. Peritoneal metastasis at the right lower quadrant has increased in size, now measuring 2.2 x 1.9 cm, versus 1.6 x  1.0 cm on the prior study. This now obscures the right ureter at this level, with resultant dilatation of the right ureter and relatively severe right-sided hydronephrosis. Given the patient's left renal atrophy, this would explain the patient's acute renal failure. 3. Nonobstructing 6 mm stone within the dilated right ureter, proximal to the mass. 4. Severe left-sided renal atrophy, with several stones at the left renal pelvis. 5. Mildly enlarged right paratracheal node measures 1.4 cm in short axis; this is nonspecific in nature. It appears increased in size from 2014. 6. Scattered diverticulosis along the descending and sigmoid colon, without evidence of diverticulitis. 7. Nodular extension of the prostate at the base of the bladder, stable from prior studies. Enlarged prostate seen. 8. Prominent bladder calculus again noted. 9. Diffuse coronary artery calcifications seen. 10. A few hypodensities within the liver, measuring up to 2.1 cm in size, appear relatively stable and may reflect cysts. 11. Scattered calcification along the abdominal aorta and its branches.  These results were called by telephone at the time of interpretation on 10/02/2013 at 5:38 am to [facility] on [facility], who verbally acknowledged these results.   Electronically  Signed   By: Garald Balding M.D.   On: 10/02/2013 05:41   Dg Chest Port 1 View  10/07/2013   CLINICAL DATA:  Pneumonia.  Wheezing.  EXAM: PORTABLE CHEST - 1 VIEW  COMPARISON:  CT, 10/02/2013 and chest radiograph, 10/01/2013.  FINDINGS: Cardiac silhouette is normal in size. Normal mediastinal contours. No convincing hilar masses.  Opacity at the right lung base obscures the right hemidiaphragm. Milder opacity is noted at the left lung base. Based on the recent prior CT, this is consistent with right larger than left pleural effusions with associated atelectasis. There is mild central vascular congestion, but no overt pulmonary edema. Lung base opacity appears increased when compared to the prior chest radiograph.  IMPRESSION: 1. Lung base opacity, mildly increased from the prior chest radiograph. Based on the prior CT, this is due to a combination of right greater left pleural effusions with associated atelectasis. Pneumonia is not excluded but felt less likely. Central vascular congestion without evidence of overt pulmonary edema.   Electronically Signed   By: Lajean Manes M.D.   On: 10/07/2013 10:09   Dg Abd Portable 1v  10/06/2013   CLINICAL DATA:  Abdominal pain  EXAM: PORTABLE ABDOMEN - 1 VIEW  COMPARISON:  CT abdomen 10/02/2013  FINDINGS: There is a bladder calculus along the left side of the bladder. There is right nephrolithiasis. There is no bowel dilatation to suggest obstruction. There is no evidence of pneumoperitoneum, portal venous gas or pneumatosis. There is an IVC filter noted. The osseous structures are unremarkable.  IMPRESSION: No bowel obstruction.  Right nephrolithiasis.  Bladder calculus.   Electronically Signed   By: Kathreen Devoid   On: 10/06/2013 17:49    Labs: Lab Results  Component Value Date   WBC 8.9 10/08/2013   HCT 36.2* 10/08/2013   MCV 89.6 10/08/2013   PLT 267 10/08/2013   NA 141 10/08/2013   K 4.2 10/08/2013   CL 104 10/08/2013   CO2 18* 10/08/2013   GLUCOSE 112*  10/08/2013   BUN 47* 10/08/2013   CREATININE 4.29* 10/08/2013   CALCIUM 9.4 10/08/2013   PROT 6.5 10/06/2013   ALBUMIN 2.9* 10/06/2013   AST 16 10/06/2013   ALT 8 10/06/2013   ALKPHOS 58 10/06/2013   BILITOT 0.3 10/06/2013   GFRNONAA 11* 10/08/2013   GFRAA 13* 10/08/2013  INR 1.99* 10/08/2013   CEA 3.2 08/01/2012    Assessment and Plan: Justin Lang. is a 80 y.o. male with complicated medical history most significantly including recurrent metastatic colorectal cancer now with pleural effusion and acute on chronic renal failure from obstruction of his functionally solitary right kidney due to enlarging peritoneal metastases. His clon cancer history dates back to 1997 when he had a sigmoid colectomy and chemotherapy. He then had a resection of a periaortic lymph node in 2002 consistent with metastatic disease, resection of cecal/appendiceal mass in 11/2011, and positive peritoneal mets diagnosed in 06/2012. He was started on chemotherapy in 08/2012 and was noted to have small residual peritoneal masses without progressive disease as of 03/2013 and no hydronephrosis. He has a history of chronic renal failure with atrophic kidneys bilaterally (L>R) and bilateral renal stones. His baseline Cr as of 12/2012 was 1.7. He also has had a chronic bladder stone noted for at least several years. The patient also has a history of a pulmonary embolism for which he has an IVC filter. He is on chronic coumadin therapy (on hold now). He also has cardiomyopathy, and SA node dysfunction managed with a pace maker. Most recent creatinine  is 4.29, PT 22.6/INR 1.99. The pt's MS seems to be worsening. He currently denies CP, N/V, bleeding or abd pain. He has chronic back pain. Request now received for a right percutaneous nephrostomy. Imaging studies have been reviewed by Dr. Earleen Newport. Pt ate today and will receive IV conscious sedation therefore will plan case for 9/25. Will administer 2 units FFP in am prior to case and check f/u  labs. Details/risks of procedure (including but not limited to , internal bleeding, infection, worsening renal failure, renal hemorrhage requiring embolization or operative management) discussed extensively with family with their understanding and consent.          I spent a total of 20 minutes face to face in clinical consultation, greater than 50% of which was counseling/coordinating care for right percutaneous nephrostomy.  Signed: Autumn Messing 10/08/2013, 1:59 PM

## 2013-10-08 NOTE — Progress Notes (Signed)
ANTICOAGULATION CONSULT NOTE - Follow Up Consult  Pharmacy Consult for Coumadin Indication: DVT Allergies  Allergen Reactions  . Vectibix [Panitumumab] Other (See Comments)    On 08/20/12 during Vectibix infusion at [facility] developed difficulty swallowing, unable answer questions due to lips/throat feeling "funny", confusion, with no affect. Has difficulty getting words out.  . Celecoxib Rash    CELEBREX    Patient Measurements: Height: 5\' 8"  (172.7 cm) Weight: 171 lb 8.3 oz (77.8 kg) IBW/kg (Calculated) : 68.4   Vital Signs: Temp: 98.3 F (36.8 C) (09/24 0403) Temp src: Oral (09/24 0403) BP: 173/97 mmHg (09/24 0543) Pulse Rate: 77 (09/24 0543)  Labs:  Recent Labs  10/06/13 0346 10/07/13 0445 10/08/13 0610  HGB 12.1* 11.9* 11.9*  HCT 36.4* 37.3* 36.2*  PLT 232 254 267  LABPROT 30.5* 23.2* 22.6*  INR 2.92* 2.06* 1.99*  CREATININE 4.85* 4.45* 4.29*    Estimated Creatinine Clearance: 11.5 ml/min (by C-G formula based on Cr of 4.29).   Medications:  Prescriptions prior to admission  Medication Sig Dispense Refill  . cholecalciferol (VITAMIN D) 1000 UNITS tablet Take 2,000 Units by mouth at bedtime.       . diphenoxylate-atropine (LOMOTIL) 2.5-0.025 MG per tablet Take 1 tablet by mouth 4 (four) times daily as needed for diarrhea or loose stools.       . mupirocin ointment (BACTROBAN) 2 % Apply 1 application topically 2 (two) times daily.       Marland Kitchen warfarin (COUMADIN) 2 MG tablet Take 1 tablet (2 mg total) by mouth as directed. Taken with 5mg  tablet on Mon, [place] & Fridays.  30 tablet  1  . warfarin (COUMADIN) 5 MG tablet Take 5 mg by mouth daily except 7 mg on Tues/Thurs or as directed.  30 tablet  3   Scheduled:  . carvedilol  6.25 mg Oral BID WC  . feeding supplement (ENSURE COMPLETE)  237 mL Oral BID BM  . isosorbide-hydrALAZINE  1 tablet Oral BID  . sodium chloride  3 mL Intravenous Q12H    Assessment: 80 y.o male w/ hx metastatic colon on warfarin prior to admission  for h/o DVT, PE w/ IVC filter.  INR= 1.99 this AM. INR has decreased from several days of >3.0 to within the therapeutic range today after coumadin being held since admission. (last dose taken PTA was on 10/01/13). CBC stable. No bleeding reported. Acute kidney injury. RF w/ obstruction of solitary R kidney 2nd mets.  Urologist is awaiting normalization of INR to have IR proceed with right nephrostomy tube placement. Cardiologist notes that unless he destabilizes - is better to allow INR to drift down. Radiologist notes today-would like values normalized prior to case.     PTA coumadin dose was 5 mg daily except 7mg  every Tues/Thursday with his last dose taken PTA on 10/01/13.     Goal of Therapy:  INR 2-3 Monitor platelets by anticoagulation protocol: Yes   Plan:  Holding coumadin due to plan for right nephrostomy tube placement when INR low enough.  Daily PT/INR.   Nicole Cella, RPh Clinical Pharmacist Pager: [phone] 10/08/2013,1:07 PM

## 2013-10-09 ENCOUNTER — Telehealth: Payer: Self-pay | Admitting: Oncology

## 2013-10-09 ENCOUNTER — Inpatient Hospital Stay (HOSPITAL_COMMUNITY): Payer: Medicare Other

## 2013-10-09 ENCOUNTER — Other Ambulatory Visit: Payer: Self-pay | Admitting: *Deleted

## 2013-10-09 DIAGNOSIS — N2889 Other specified disorders of kidney and ureter: Secondary | ICD-10-CM

## 2013-10-09 DIAGNOSIS — I495 Sick sinus syndrome: Secondary | ICD-10-CM

## 2013-10-09 LAB — CBC WITH DIFFERENTIAL/PLATELET
Basophils Absolute: 0 10*3/uL (ref 0.0–0.1)
Basophils Relative: 1 % (ref 0–1)
EOS ABS: 0.8 10*3/uL — AB (ref 0.0–0.7)
EOS PCT: 10 % — AB (ref 0–5)
HEMATOCRIT: 33 % — AB (ref 39.0–52.0)
HEMOGLOBIN: 10.9 g/dL — AB (ref 13.0–17.0)
LYMPHS ABS: 1.1 10*3/uL (ref 0.7–4.0)
Lymphocytes Relative: 13 % (ref 12–46)
MCH: 29.7 pg (ref 26.0–34.0)
MCHC: 33 g/dL (ref 30.0–36.0)
MCV: 89.9 fL (ref 78.0–100.0)
MONOS PCT: 10 % (ref 3–12)
Monocytes Absolute: 0.9 10*3/uL (ref 0.1–1.0)
Neutro Abs: 5.6 10*3/uL (ref 1.7–7.7)
Neutrophils Relative %: 66 % (ref 43–77)
Platelets: 251 10*3/uL (ref 150–400)
RBC: 3.67 MIL/uL — AB (ref 4.22–5.81)
RDW: 15.1 % (ref 11.5–15.5)
WBC: 8.4 10*3/uL (ref 4.0–10.5)

## 2013-10-09 LAB — BASIC METABOLIC PANEL
ANION GAP: 18 — AB (ref 5–15)
BUN: 50 mg/dL — AB (ref 6–23)
CHLORIDE: 107 meq/L (ref 96–112)
CO2: 20 mEq/L (ref 19–32)
Calcium: 9 mg/dL (ref 8.4–10.5)
Creatinine, Ser: 4.32 mg/dL — ABNORMAL HIGH (ref 0.50–1.35)
GFR calc non Af Amer: 11 mL/min — ABNORMAL LOW (ref >90)
GFR, EST AFRICAN AMERICAN: 13 mL/min — AB (ref >90)
Glucose, Bld: 102 mg/dL — ABNORMAL HIGH (ref 70–99)
Potassium: 4.3 mEq/L (ref 3.7–5.3)
Sodium: 145 mEq/L (ref 137–147)

## 2013-10-09 LAB — PROTIME-INR
INR: 2.04 — ABNORMAL HIGH (ref 0.00–1.49)
PROTHROMBIN TIME: 23 s — AB (ref 11.6–15.2)

## 2013-10-09 LAB — APTT: aPTT: 54 seconds — ABNORMAL HIGH (ref 24–37)

## 2013-10-09 MED ORDER — CEFAZOLIN SODIUM-DEXTROSE 2-3 GM-% IV SOLR
INTRAVENOUS | Status: AC
  Administered 2013-10-09: 2000 mg
  Filled 2013-10-09: qty 50

## 2013-10-09 MED ORDER — LIDOCAINE HCL 1 % IJ SOLN
INTRAMUSCULAR | Status: AC
  Filled 2013-10-09: qty 20

## 2013-10-09 MED ORDER — FENTANYL CITRATE 0.05 MG/ML IJ SOLN
INTRAMUSCULAR | Status: AC | PRN
  Administered 2013-10-09: 12.5 ug via INTRAVENOUS

## 2013-10-09 MED ORDER — MIDAZOLAM HCL 2 MG/2ML IJ SOLN
INTRAMUSCULAR | Status: AC | PRN
  Administered 2013-10-09: 0.5 mg via INTRAVENOUS

## 2013-10-09 MED ORDER — MIDAZOLAM HCL 2 MG/2ML IJ SOLN
INTRAMUSCULAR | Status: AC
  Filled 2013-10-09: qty 2

## 2013-10-09 MED ORDER — IOHEXOL 300 MG/ML  SOLN
50.0000 mL | Freq: Once | INTRAMUSCULAR | Status: AC | PRN
  Administered 2013-10-09: 20 mL

## 2013-10-09 MED ORDER — FENTANYL CITRATE 0.05 MG/ML IJ SOLN
INTRAMUSCULAR | Status: AC
  Filled 2013-10-09: qty 2

## 2013-10-09 NOTE — Progress Notes (Signed)
Admin pt FFP, beginning at 0900.  V/S were obtained at 0900 and documented.  I remained with pt for 71min.  Pt had no signs of allergic reaction.  V/S were obtained at 0915 and documented.  Pt had no signs of allergic reaction.  At apprx 1000, pt was transferred to radiology for nephrostomy tube placement while FFP was still running.  Therefore, I was unable to get V/S at the time the FFP finished. However, I did obtain V/S once he returned, and documented.

## 2013-10-09 NOTE — Progress Notes (Signed)
Notified by [facility], patient and family request services of [facility] ([facility]) after discharge.  Spoke with wife Bethena Roys and d-i-l Harlow Mares at bedside; pt awake but lethargic has just returned from R percutaneous Nephrostomy placement, pt was able to take approximately 15% of lunch being fed by wife.  Writer initiated education related to hospice services, philosophy and team approach to care with good understanding voiced by wife and d-i-l. -Family shared their understanding is for discharge on Sunday; they plan to speak to Dr Dyann Kief about having PT see pt again as they want to see if he will be able to transport home by car (prior to this hospital stay pt was slow, but ambulatory). -Wife shared pt had trouble the last couple of nights with increased restlessness/confusion; asking if there is something they can try orally that they would be able to use at discharge - per family pt has reacted adversely to Ativan in the past -Wife has questions about care of Perc Neph Tube at home and was assured the staff RN would be reviewing care instructions with her; assured wife she will get a list of current discharge medications, perscriptions and instructions from the staff once pt is ready to d/c and these will be reviewed with them once pt is at home Dr Dyann Kief contacted about above, he informs plan is for discharge Sunday and will address questions with family    DME needs discussed- currently pt has a BSC, walker and wheelchair in the home; pt has a new bed and has used a wedge pillow for sleep -at this time Bethena Roys stated they would decline a hospital bed and see how things went once they got home;   Initial paperwork faxed to [facility]  * Completed d/c summary will need to be faxed to [facility] @ [phone] when final Please notify [facility] when patient is ready to leave unit call  [phone]  [facility] information and contact numbers also given to wife and d-i-l  during visit.   Above information shared with [facility] Please call with any questions or concerns   Danton Sewer, RN 10/09/2013, 2:09 PM [facility] [place] [facility] Liaison [phone]

## 2013-10-09 NOTE — Progress Notes (Signed)
Orders placed to D/C tele.

## 2013-10-09 NOTE — Telephone Encounter (Signed)
, °

## 2013-10-09 NOTE — Progress Notes (Signed)
[facility] PROGRESS NOTE  Justin Lang. WGN:[id number] DOB: Jan 24, 1922 DOA: 10/01/2013 PCP: Delphina Cahill, MD  Off Service Summary [phone] who presented with chest pain, found to have likely CAP and ARF with Cr of over 5. On work up, pt was found to have a metastatic lesion from known colon cancer impinge on ureter. Urology following. PCN is planned however pt's INR is markedly elevated. Pt has a hypercoagulable history so our goal is to conservatively allow INR to trend down. Also, pt was found to have new EF of 96% with diastolic dysfunction. Also noted to have brief period of [facility]. Cardiology following. No plans for ICD per family wishes. On beta blocker now. S/p PCN as palliative approach for hydronephrosis.  Assessment/Plan: 1. Chest pain 1. Serial trop were neg 2. 2d echo with no WMA but with new 29-52% EF with diastolic dysfunction; cardiology on board 3. No further intervention/work up anticipated at this moment 2. 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 Pt appears compensated at this time and no complaining of SOB 2. Presenting BNP was markedly elevated, but question if renal failure is contributing to elevated BNP 3. low EF of 20-25%; b-blockers on board (changed now to coreg) 4. TSH was normal 5. Wts have trended as follows: 75kg->76kg->77kg with pos fluid balance, thus IVF have been d/c'd and antibiotics changed to PO. Current weight is 72KG. 6. Hold ACEI or arb secondary to renal failure 7. Cardiology following 3. CAP 1. Remains afebrile 2. Will finish abx's with augmentin now PO (plan is 4 more days) 3. No leukocytosis 4. Breathing stable 4. AKI 1. improving slowly; most likely due to hydroureter/hydronephrosis  2. Appreciate Urology input and IR PCN tube placement on 9/25 3. Will follow renal function 4. Expecting significant improvement once obstruction bypass with PCN tube 5. Metastatic colon ca 1. Followed by Oncology as outpatient 2. Per  oncology rec's, will set up hospice at home at discharge 6. Hx DVT 1. Pt with hypercoagulable state, is s/p IVC filter placement 2. Will resume coumadin per pharmacy (starting 9/26) 7. Coumadin coagulopathy 1. To be restarted by pharmacy on 9/26 2. No major bleeding appreciated during PCN 3. CBC in am 8. Wide complex tachycardia 1. Noted during this admit 2. Pt noted to be asymptomatic during this episode 3. Given above dx of EF of 20%, consulted Cardiology 4. Pt currently with pacemaker, not ICD per family wishes 5. Continue b-blocker; cardiology following 6. No further abnormality seen on tele; will discontinue at this point. That will help decreasing degree of agitation.  Code Status: DNR Family Communication: Pt and wife in room Disposition Plan: home with hospice at discharge; 1-2 days.   Consultants:  Urology  Cardiology  Procedures:  PCN tube 9/25  Antibiotics:  Rocephin 9/17>>>9/24  Azithromycin 9/17>>>9/24  augmentin 9/24 (will treat for 5 days total)  HPI/Subjective: Afebrile, no CP and per family no acute discomfort. S/p PCN placement and sleepy.  Objective: Filed Vitals:   10/09/13 1056 10/09/13 1105 10/09/13 1109 10/09/13 1129  BP: 136/82 134/75 139/68 140/76  Pulse: 69 65 66 60  Temp:      TempSrc:      Resp: 19 18 18 18   Height:      Weight:      SpO2: 97% 96% 98% 95%    Intake/Output Summary (Last 24 hours) at 10/09/13 1531 Last data filed at 10/09/13 1400  Gross per 24 hour  Intake    340 ml  Output  1060 ml  Net   -720 ml   Filed Weights   10/07/13 0438 10/08/13 0403 10/09/13 0502  Weight: 78.2 kg (172 lb 6.4 oz) 77.8 kg (171 lb 8.3 oz) 78.1 kg (172 lb 2.9 oz)    Exam:   General:  Sleepy after PCN, in no distress, no fever  Cardiovascular: regular, s1, s2; no rubs  Respiratory: normal resp effort, no wheezing; mild decrease BS at bases  Abdomen: soft,nondistended; positive BS  Musculoskeletal: perfused, no clubbing,  trace edema bilaterally  Data Reviewed: Basic Metabolic Panel:  Recent Labs Lab 10/03/13 2250  10/06/13 0346 10/07/13 0445 10/08/13 0610 10/08/13 1607 10/09/13 0502  NA  --   < > 143 141 141 141 145  K  --   < > 4.7 4.5 4.2 4.1 4.3  CL  --   < > 107 105 104 105 107  CO2  --   < > 22 18* 18* 20 20  GLUCOSE  --   < > 103* 110* 112* 112* 102*  BUN  --   < > 41* 43* 47* 48* 50*  CREATININE  --   < > 4.85* 4.45* 4.29* 4.24* 4.32*  CALCIUM  --   < > 9.1 9.0 9.4 9.1 9.0  MG 1.8  --   --   --   --   --   --   < > = values in this interval not displayed. Liver Function Tests:  Recent Labs Lab 10/03/13 0403 10/04/13 0825 10/05/13 0700 10/06/13 0346  AST 14 17 16 16   ALT 5 5 6 8   ALKPHOS 64 57 51 58  BILITOT 0.4 0.4 0.3 0.3  PROT 6.8 6.1 6.2 6.5  ALBUMIN 2.9* 2.7* 2.6* 2.9*   CBC:  Recent Labs Lab 10/05/13 0700 10/06/13 0346 10/07/13 0445 10/08/13 0610 10/09/13 0502  WBC 7.8 9.4 8.8 8.9 8.4  NEUTROABS 5.3 6.6 6.5 6.7 5.6  HGB 11.8* 12.1* 11.9* 11.9* 10.9*  HCT 35.1* 36.4* 37.3* 36.2* 33.0*  MCV 89.1 90.3 93.5 89.6 89.9  PLT 233 232 254 267 251   BNP (last 3 results)  Recent Labs  10/01/13 1845  PROBNP 17357.0*   CBG:  Recent Labs Lab 10/08/13 2121  GLUCAP 176*    Recent Results (from the past 240 hour(s))  CULTURE, BLOOD (ROUTINE X 2)     Status: None   Collection Time    10/01/13 11:08 PM      Result Value Ref Range Status   Specimen Description BLOOD ARM LEFT   Final   Special Requests     Final   Value: BOTTLES DRAWN AEROBIC AND ANAEROBIC 10CC AZITHROMYCIN, CEFTRIAXONE   Culture  Setup Time     Final   Value: 10/02/2013 05:41     Performed at [facility]   Culture     Final   Value: NO GROWTH 5 DAYS     Performed at [facility]   Report Status 10/08/2013 FINAL   Final  CULTURE, BLOOD (ROUTINE X 2)     Status: None   Collection Time    10/01/13 11:15 PM      Result Value Ref Range Status   Specimen Description BLOOD  HAND LEFT   Final   Special Requests     Final   Value: BOTTLES DRAWN AEROBIC ONLY 10CC AZITHROMYCIN, CEFTRIAXONE   Culture  Setup Time     Final   Value: 10/02/2013 05:40     Performed at [facility]   Culture     Final   Value: NO GROWTH 5 DAYS     Performed at [facility]   Report Status 10/08/2013 FINAL   Final  URINE CULTURE     Status: None   Collection Time    10/02/13  7:59 AM      Result Value Ref Range Status   Specimen Description URINE, RANDOM   Final   Special Requests NONE   Final   Culture  Setup Time     Final   Value: 10/02/2013 14:34     Performed at [place]     Final   Value: 6,000 COLONIES/ML     Performed at [facility]   Culture     Final   Value: INSIGNIFICANT GROWTH     Performed at [facility]   Report Status 10/03/2013 FINAL   Final     Studies: Ir Perc Nephrostomy Right  10/09/2013   CLINICAL DATA:  Metastatic colon cancer, obstructive right hydronephrosis  EXAM: ULTRASOUND GUIDANCE FOR NEPHROSTOMY ACCESS  10 FRENCH RIGHT NEPHROSTOMY  Date:  9/25/20159/25/2015 11:13 am  Radiologist:  M. Daryll Brod, MD  Guidance:  Ultrasound and fluoroscopic  FLUOROSCOPY TIME:  42 seconds  MEDICATIONS AND MEDICAL HISTORY: 0.5 mg Versed, 12.5 mcg fentanyl, 2 g Ancef administered within 1 hr of the procedure  ANESTHESIA/SEDATION: 15 min  CONTRAST:  40mL OMNIPAQUE IOHEXOL 300 MG/ML  SOLN  COMPLICATIONS: No immediate  PROCEDURE: Informed consent was obtained from the patient following explanation of the procedure, risks, benefits and alternatives. The patient understands, agrees and consents for the procedure. All questions were addressed. A time out was performed.  Maximal barrier sterile technique utilized including caps, mask, sterile gowns, sterile gloves, large sterile drape, hand hygiene, and Betadine.  Previous imaging reviewed. Patient positioned prone. Hydronephrotic right kidney was localized. Under sterile  conditions and local anesthesia, a 21 gauge access needle was advanced percutaneously into the right kidney lower pole dilated calyx under direct ultrasound. Images obtained for documentation. There was return of urine. Guidewire inserted followed by Accustick dilator set. Amplatz guidewire advanced. Tract dilatation performed to insert a 10 French nephrostomy. Retention loop formed in the renal pelvis. Images obtained for documentation. Catheter secured with a Prolene suture and connected to external gravity drainage. Sterile dressing applied. No immediate complication. Patient tolerated the procedure well.  IMPRESSION: Successful ultrasound and fluoroscopic 10 French right nephrostomy   Electronically Signed   By: Daryll Brod M.D.   On: 10/09/2013 12:08   Ir US Guide Bx Asp/drain  10/09/2013   CLINICAL DATA:  Metastatic colon cancer, obstructive right hydronephrosis  EXAM: ULTRASOUND GUIDANCE FOR NEPHROSTOMY ACCESS  10 FRENCH RIGHT NEPHROSTOMY  Date:  9/25/20159/25/2015 11:13 am  Radiologist:  M. Daryll Brod, MD  Guidance:  Ultrasound and fluoroscopic  FLUOROSCOPY TIME:  42 seconds  MEDICATIONS AND MEDICAL HISTORY: 0.5 mg Versed, 12.5 mcg fentanyl, 2 g Ancef administered within 1 hr of the procedure  ANESTHESIA/SEDATION: 15 min  CONTRAST:  72mL OMNIPAQUE IOHEXOL 300 MG/ML  SOLN  COMPLICATIONS: No immediate  PROCEDURE: Informed consent was obtained from the patient following explanation of the procedure, risks, benefits and alternatives. The patient understands, agrees and consents for the procedure. All questions were addressed. A time out was performed.  Maximal barrier sterile technique utilized including caps, mask, sterile gowns, sterile gloves, large sterile drape, hand hygiene, and Betadine.  Previous imaging reviewed. Patient positioned prone. Hydronephrotic  right kidney was localized. Under sterile conditions and local anesthesia, a 21 gauge access needle was advanced percutaneously into the right  kidney lower pole dilated calyx under direct ultrasound. Images obtained for documentation. There was return of urine. Guidewire inserted followed by Accustick dilator set. Amplatz guidewire advanced. Tract dilatation performed to insert a 10 French nephrostomy. Retention loop formed in the renal pelvis. Images obtained for documentation. Catheter secured with a Prolene suture and connected to external gravity drainage. Sterile dressing applied. No immediate complication. Patient tolerated the procedure well.  IMPRESSION: Successful ultrasound and fluoroscopic 10 French right nephrostomy   Electronically Signed   By: Daryll Brod M.D.   On: 10/09/2013 12:08    Scheduled Meds: . amoxicillin-clavulanate  1 tablet Oral Q12H  . carvedilol  6.25 mg Oral BID WC  .  ceFAZolin (ANCEF) IV  2 g Intravenous On Call  . feeding supplement (ENSURE COMPLETE)  237 mL Oral BID BM  . fentaNYL      . isosorbide-hydrALAZINE  1 tablet Oral BID  . lidocaine      . midazolam      . sodium chloride  3 mL Intravenous Q12H   Continuous Infusions:    Principal Problem:   CAP (community acquired pneumonia) Active Problems:   COLON CANCER, HX OF   DVT (deep venous thrombosis), unspecified laterality   Chest pain   AKI (acute kidney injury)   Cardiomyopathy   Obstructive nephropathy = external ureteral obstruction  Time spent: <  35 min  Barton Dubois  [facility] Pager [phone]. If 7PM-7AM, please contact night-coverage at [email or web address], password [facility] 10/09/2013, 3:31 PM  LOS: 8 days

## 2013-10-09 NOTE — Progress Notes (Signed)
ANTICOAGULATION CONSULT NOTE - Follow Up Consult  Pharmacy Consult for Coumadin Indication: DVT Allergies  Allergen Reactions  . Vectibix [Panitumumab] Other (See Comments)    On 08/20/12 during Vectibix infusion at [facility] developed difficulty swallowing, unable answer questions due to lips/throat feeling "funny", confusion, with no affect. Has difficulty getting words out.  . Celecoxib Rash    CELEBREX    Patient Measurements: Height: 5\' 8"  (172.7 cm) Weight: 172 lb 2.9 oz (78.1 kg) IBW/kg (Calculated) : 68.4   Vital Signs: Temp: 97.7 F (36.5 C) (09/25 0915) Temp src: Axillary (09/25 0915) BP: 140/76 mmHg (09/25 1129) Pulse Rate: 60 (09/25 1129)  Labs:  Recent Labs  10/07/13 0445 10/08/13 0610 10/08/13 1607 10/09/13 0502  HGB 11.9* 11.9*  --  10.9*  HCT 37.3* 36.2*  --  33.0*  PLT 254 267  --  251  APTT  --   --   --  54*  LABPROT 23.2* 22.6*  --  23.0*  INR 2.06* 1.99*  --  2.04*  CREATININE 4.45* 4.29* 4.24* 4.32*    Estimated Creatinine Clearance: 11.4 ml/min (by C-G formula based on Cr of 4.32).   Assessment: 80 y.o male w/ hx metastatic colon on warfarin prior to admission for h/o DVT, PE w/ IVC filter.  INR= 2.04 this AM. Coumadin has been held since admission. (last dose taken PTA was on 10/01/13). CBC stable. No bleeding reported. Acute kidney injury. RF w/ obstruction of solitary R kidney 2nd mets.  FFP given this morning and right nephrostomy tube placed. Dr. Dyann Kief asks for pharmacy to resume coumadin dosing on Saturday 9/26.  PTA coumadin dose was 5 mg daily except 7mg  every Tues/Thursday with his last dose taken PTA on 10/01/13.   Goal of Therapy:  INR 2-3 Monitor platelets by anticoagulation protocol: Yes   Plan:  Per Dr. Dyann Kief, no coumadin today, but resume on Saturday 9/26 right nephrostomy tube placed today.  Daily PT/INR.  Eudelia Bunch, Pharm.D. [phone] 10/09/2013 3:37 PM

## 2013-10-09 NOTE — Procedures (Signed)
Successful 7fr RT PCN  NO COMP STABLE KEEP TO GRAVITY BAG FULL REPORT IN PACS

## 2013-10-09 NOTE — Progress Notes (Signed)
Patient Name: Justin Lang. Date of Encounter: 10/09/2013  Principal Problem:   CAP (community acquired pneumonia) Active Problems:   COLON CANCER, HX OF   DVT (deep venous thrombosis), unspecified laterality   Chest pain   AKI (acute kidney injury)   Cardiomyopathy   Obstructive nephropathy = external ureteral obstruction    Patient Profile: Patient Profile: 80 yo male w/ hx metastatic colon CA, MDT PPM, DVT on coumadin, PE w/ IVC filter, admitted 09/17 w/ PNA, acute on chronic RF w/ obstruction of solitary R kidney 2nd mets. Cards saw 09/20 for EF newly 20-25% and NSVT. INR drifting down to allow nephrostomy tube.   SUBJECTIVE: Denies CP/SOB, but resp rate seem a little labored today. Still confused but family says he got some rest last pm (has been having problems with sundowning).  OBJECTIVE Filed Vitals:   10/09/13 0502 10/09/13 0900 10/09/13 0915 10/09/13 0959  BP: 120/71 154/60 132/73 165/67  Pulse: 70 70 69 67  Temp: 97 F (36.1 C) 98 F (36.7 C) 97.7 F (36.5 C)   TempSrc: Oral Axillary Axillary   Resp: 20     Height:      Weight: 172 lb 2.9 oz (78.1 kg)     SpO2: 93%       Intake/Output Summary (Last 24 hours) at 10/09/13 1042 Last data filed at 10/09/13 0900  Gross per 24 hour  Intake    583 ml  Output    610 ml  Net    -27 ml   Filed Weights   10/07/13 0438 10/08/13 0403 10/09/13 0502  Weight: 172 lb 6.4 oz (78.2 kg) 171 lb 8.3 oz (77.8 kg) 172 lb 2.9 oz (78.1 kg)    PHYSICAL EXAM General: Well developed, well nourished, male in no acute distress. Head: Normocephalic, atraumatic.  Neck: Supple without bruits, JVD 10 cm. Lungs:  Resp regular and unlabored, rales bases. Heart: RRR, S1, S2, no S3, S4, or murmur; no rub. Abdomen: Soft, non-tender, non-distended, BS + x 4.  Extremities: No clubbing, cyanosis, no edema.  Neuro: Alert and oriented X 1. Moves all extremities spontaneously. Knows family. Psych: Normal  affect.  LABS: CBC: Recent Labs  10/08/13 0610 10/09/13 0502  WBC 8.9 8.4  NEUTROABS 6.7 5.6  HGB 11.9* 10.9*  HCT 36.2* 33.0*  MCV 89.6 89.9  PLT 267 251   INR: Recent Labs  10/09/13 0502  INR 1.60*   Basic Metabolic Panel: Recent Labs  10/08/13 1607 10/09/13 0502  NA 141 145  K 4.1 4.3  CL 105 107  CO2 20 20  GLUCOSE 112* 102*  BUN 48* 50*  CREATININE 4.24* 4.32*  CALCIUM 9.1 9.0   BNP: Pro B Natriuretic peptide (BNP)  Date/Time Value Ref Range Status  10/01/2013  6:45 PM 17357.0* 0 - 450 pg/mL Final    TELE:   Not available for review  Radiology/Studies: No results found.   Current Medications:  . amoxicillin-clavulanate  1 tablet Oral Q12H  . carvedilol  6.25 mg Oral BID WC  . ceFAZolin      .  ceFAZolin (ANCEF) IV  2 g Intravenous On Call  . feeding supplement (ENSURE COMPLETE)  237 mL Oral BID BM  . fentaNYL      . isosorbide-hydrALAZINE  1 tablet Oral BID  . lidocaine      . midazolam      . sodium chloride  3 mL Intravenous Q12H      ASSESSMENT AND PLAN:  Principal Problem:   CAP (community acquired pneumonia) - Per IM  repeat CXR more consistent w/ effusions  IV ABx d/c'd  Active Problems:   COLON CANCER, HX OF - per IM     DVT (deep venous thrombosis), unspecified laterality    Obstructive nephropathy = external ureteral obstruction - Per IM  for nephrostomy tube today   Chest pain -  resolved and troponin negative x 3.  Hold off on ASA until post Urology procedure  AKI (acute kidney injury) - per IM/Urology, peak Cr 5.49. Nephrostomy tube planned   INR low enough today .    Cardiomyopathy - new dx, EF 20-25% w/ no RWMA, grade 2 diast dysf. Follow.  Weight no sig change but increasing volume by exam, however, with renal issues, no Lasix. Follow after nephrostomy tube, may auto-diurese. On BB - BP/HR improving, metoprolol changed to carvedilol 09/24  Not on ARB/ACE-I due to renal insufficiency; will hold off on adding  afterload reducing agent.  Not on diuretic. See above  Had stridor 09/23 - re-look CXR not much change  Overanticoagulation - INR 4.68 on admit, then 4.9  INR trending down, reversal not given. Per IM   NSVT - seen on telemetry and PPM interrogation previously. None overnight. Not unexpected with NICM & EF ~20-25%  PPM adjusted 09/23  Continue BB.   He NEEDS - better overnight management, as he is combative at times and family having trouble handling him. Will leave to IM, Haldol is ordered, pt may need a sitter. Family may be unable to stay overnight    Signed, Rosaria Ferries , PA-C 10:42 AM 10/09/2013   I have seen and evaluated the patient this AM along with Rosaria Ferries , PA-C. I agree with  her findings, examination as well as impression recommendations.  Resting comfortably after Per Nephrostomy.   INR was still ~ 2.0, but FFP given.  Remains stable from a cardiac standpoint, but once renal function normalizes, would use low dose PO Lasix (20-40 mg) as standing dose to avoid potential volume overload.  Restart warfarin - no bridge  Continue Bidil & Carvedilol - BP & HR stable. PPM adjusted, stable   No active Cardiac issues besides timing of restarting diuretic (defer to [facility] & renal Fxn).  Will sign off for now. OP f/u will be arranged for d/c.  Call with ?s.   Leonie Man, M.D., M.S. Interventional Cardiologist   Pager # [phone]

## 2013-10-09 NOTE — Progress Notes (Signed)
Planning hospital discharge today. Reschedule 10/12/13 OV for 3-4 weeks. POF to scheduler.

## 2013-10-09 NOTE — Progress Notes (Signed)
NUTRITION FOLLOW UP  Intervention:   Continue Ensure Complete BID Provide Magic Cup BID with meals  Nutrition Dx:   Inadequate oral intake related to inability to eat as evidenced by previous NPO status; discontinued- diet advanced  New Nutrition Dx: Inadequate oral intake related to poor appetite/confusion and pain as evidenced by 10-20% meal completion at most meals for the past week.  Goal:   Pt to meet >/= 90% of their estimated nutrition needs; unmet  Monitor:   PO intake, weight trends, labs, I/Os  Assessment:   80 year old male with history of recurrent stage 4 prostate and colon cancer, SA node dysfunction s/p pacemaker, presents with chest pain, CAP, AKI, cardiomyopathy.    9/25: per nursing notes pt has been eating 10-20% of most meals for the past week; 100% of one meal yesterday. Pt asleep at time of visit. Per family pt was eating poorly this past week, partially due to kidney pain but, today pt is eating much better. Per nursing notes pt at 100% of breakfast and lunch today. Per family pt likes Ensure but, he dislikes strawberry flavor which is given often.  RD provided family with coupons/information about nutrition supplements; encouraged pt to drink one if meal is skipped.  No evidence of weight loss since admission. Labs reviewed. Elevated BUN and Creatinine, Low GFR  9/17: Patient reports that he has never been a big eater, but denies changes in appetite. He is not sure if he has lost weight recently, however chart review shows a 5% weight loss in 7 months, which is not significant over the time frame. Diet just advanced today to Heart Healthy.    Height: Ht Readings from Last 1 Encounters:  10/02/13 5\' 8"  (1.727 m)    Weight Status:   Wt Readings from Last 1 Encounters:  10/09/13 172 lb 2.9 oz (78.1 kg)  10/02/13 166 lb 14.2 oz (75.7 kg)   Re-estimated needs:  Kcal: 1700-1800 kcal  Protein: 75-90 g  Fluid: 2.1-2.3 L/day   Skin: +1 RLE and LLE edema;  intact  Diet Order: Cardiac   Intake/Output Summary (Last 24 hours) at 10/09/13 1543 Last data filed at 10/09/13 1400  Gross per 24 hour  Intake    340 ml  Output   1060 ml  Net   -720 ml    Last BM: 9/23   Labs:   Recent Labs Lab 10/03/13 2250  10/08/13 0610 10/08/13 1607 10/09/13 0502  NA  --   < > 141 141 145  K  --   < > 4.2 4.1 4.3  CL  --   < > 104 105 107  CO2  --   < > 18* 20 20  BUN  --   < > 47* 48* 50*  CREATININE  --   < > 4.29* 4.24* 4.32*  CALCIUM  --   < > 9.4 9.1 9.0  MG 1.8  --   --   --   --   GLUCOSE  --   < > 112* 112* 102*  < > = values in this interval not displayed.  CBG (last 3)   Recent Labs  10/08/13 2121  GLUCAP 176*    Scheduled Meds: . amoxicillin-clavulanate  1 tablet Oral Q12H  . carvedilol  6.25 mg Oral BID WC  .  ceFAZolin (ANCEF) IV  2 g Intravenous On Call  . feeding supplement (ENSURE COMPLETE)  237 mL Oral BID BM  . fentaNYL      .  isosorbide-hydrALAZINE  1 tablet Oral BID  . lidocaine      . midazolam      . sodium chloride  3 mL Intravenous Q12H    Continuous Infusions:   Pryor Ochoa RD, LDN Inpatient Clinical Dietitian Pager: [phone] After Hours Pager: [phone]

## 2013-10-10 DIAGNOSIS — Z85038 Personal history of other malignant neoplasm of large intestine: Secondary | ICD-10-CM

## 2013-10-10 LAB — PREPARE FRESH FROZEN PLASMA
UNIT DIVISION: 0
Unit division: 0

## 2013-10-10 LAB — BASIC METABOLIC PANEL
ANION GAP: 18 — AB (ref 5–15)
BUN: 47 mg/dL — ABNORMAL HIGH (ref 6–23)
CALCIUM: 8.9 mg/dL (ref 8.4–10.5)
CHLORIDE: 105 meq/L (ref 96–112)
CO2: 21 meq/L (ref 19–32)
Creatinine, Ser: 4.07 mg/dL — ABNORMAL HIGH (ref 0.50–1.35)
GFR calc Af Amer: 14 mL/min — ABNORMAL LOW (ref >90)
GFR calc non Af Amer: 12 mL/min — ABNORMAL LOW (ref >90)
Glucose, Bld: 94 mg/dL (ref 70–99)
Potassium: 4.1 mEq/L (ref 3.7–5.3)
Sodium: 144 mEq/L (ref 137–147)

## 2013-10-10 LAB — CBC WITH DIFFERENTIAL/PLATELET
Basophils Absolute: 0.1 10*3/uL (ref 0.0–0.1)
Basophils Relative: 1 % (ref 0–1)
Eosinophils Absolute: 0.7 10*3/uL (ref 0.0–0.7)
Eosinophils Relative: 10 % — ABNORMAL HIGH (ref 0–5)
HCT: 33.9 % — ABNORMAL LOW (ref 39.0–52.0)
HEMOGLOBIN: 11.1 g/dL — AB (ref 13.0–17.0)
LYMPHS ABS: 1 10*3/uL (ref 0.7–4.0)
Lymphocytes Relative: 14 % (ref 12–46)
MCH: 30.5 pg (ref 26.0–34.0)
MCHC: 32.7 g/dL (ref 30.0–36.0)
MCV: 93.1 fL (ref 78.0–100.0)
MONOS PCT: 9 % (ref 3–12)
Monocytes Absolute: 0.6 10*3/uL (ref 0.1–1.0)
NEUTROS ABS: 4.7 10*3/uL (ref 1.7–7.7)
NEUTROS PCT: 66 % (ref 43–77)
Platelets: 257 10*3/uL (ref 150–400)
RBC: 3.64 MIL/uL — AB (ref 4.22–5.81)
RDW: 15.2 % (ref 11.5–15.5)
WBC: 7 10*3/uL (ref 4.0–10.5)

## 2013-10-10 LAB — PROTIME-INR
INR: 1.89 — AB (ref 0.00–1.49)
Prothrombin Time: 21.7 seconds — ABNORMAL HIGH (ref 11.6–15.2)

## 2013-10-10 MED ORDER — WARFARIN SODIUM 7.5 MG PO TABS
7.5000 mg | ORAL_TABLET | Freq: Once | ORAL | Status: AC
  Administered 2013-10-10: 7.5 mg via ORAL
  Filled 2013-10-10: qty 1

## 2013-10-10 MED ORDER — WARFARIN - PHARMACIST DOSING INPATIENT: Freq: Every day | Status: DC

## 2013-10-10 MED ORDER — WARFARIN SODIUM 5 MG PO TABS
5.0000 mg | ORAL_TABLET | Freq: Once | ORAL | Status: DC
  Filled 2013-10-10: qty 1

## 2013-10-10 MED ORDER — FUROSEMIDE 40 MG PO TABS
40.0000 mg | ORAL_TABLET | Freq: Every day | ORAL | Status: DC
  Administered 2013-10-10 – 2013-10-11 (×2): 40 mg via ORAL
  Filled 2013-10-10 (×2): qty 1

## 2013-10-10 NOTE — Progress Notes (Signed)
Subjective: Pt resting comfortably. Denies c/o much pain  Objective: Physical Exam: BP 167/64  Pulse 83  Temp(Src) 97.9 F (36.6 C) (Oral)  Resp 18  Ht 5\' 8"  (1.727 m)  Wt 170 lb 3.1 oz (77.2 kg)  BMI 25.88 kg/m2  SpO2 97% (R)PCN intact, site clean, NT Output blood tinged, no clots, 1600 mL output recorded   Labs: CBC  Recent Labs  10/09/13 0502 10/10/13 0448  WBC 8.4 7.0  HGB 10.9* 11.1*  HCT 33.0* 33.9*  PLT 251 257   BMET  Recent Labs  10/09/13 0502 10/10/13 0448  NA 145 144  K 4.3 4.1  CL 107 105  CO2 20 21  GLUCOSE 102* 94  BUN 50* 47*  CREATININE 4.32* 4.07*  CALCIUM 9.0 8.9   LFT No results found for this basename: PROT, ALBUMIN, AST, ALT, ALKPHOS, BILITOT, BILIDIR, IBILI, LIPASE,  in the last 72 hours PT/INR  Recent Labs  10/09/13 0502 10/10/13 0448  LABPROT 23.0* 21.7*  INR 2.04* 1.89*     Studies/Results: Ir Perc Nephrostomy Right  10/09/2013   CLINICAL DATA:  Metastatic colon cancer, obstructive right hydronephrosis  EXAM: ULTRASOUND GUIDANCE FOR NEPHROSTOMY ACCESS  10 FRENCH RIGHT NEPHROSTOMY  Date:  9/25/20159/25/2015 11:13 am  Radiologist:  M. Daryll Brod, MD  Guidance:  Ultrasound and fluoroscopic  FLUOROSCOPY TIME:  42 seconds  MEDICATIONS AND MEDICAL HISTORY: 0.5 mg Versed, 12.5 mcg fentanyl, 2 g Ancef administered within 1 hr of the procedure  ANESTHESIA/SEDATION: 15 min  CONTRAST:  70mL OMNIPAQUE IOHEXOL 300 MG/ML  SOLN  COMPLICATIONS: No immediate  PROCEDURE: Informed consent was obtained from the patient following explanation of the procedure, risks, benefits and alternatives. The patient understands, agrees and consents for the procedure. All questions were addressed. A time out was performed.  Maximal barrier sterile technique utilized including caps, mask, sterile gowns, sterile gloves, large sterile drape, hand hygiene, and Betadine.  Previous imaging reviewed. Patient positioned prone. Hydronephrotic right kidney was localized.  Under sterile conditions and local anesthesia, a 21 gauge access needle was advanced percutaneously into the right kidney lower pole dilated calyx under direct ultrasound. Images obtained for documentation. There was return of urine. Guidewire inserted followed by Accustick dilator set. Amplatz guidewire advanced. Tract dilatation performed to insert a 10 French nephrostomy. Retention loop formed in the renal pelvis. Images obtained for documentation. Catheter secured with a Prolene suture and connected to external gravity drainage. Sterile dressing applied. No immediate complication. Patient tolerated the procedure well.  IMPRESSION: Successful ultrasound and fluoroscopic 10 French right nephrostomy   Electronically Signed   By: Daryll Brod M.D.   On: 10/09/2013 12:08   Ir US Guide Bx Asp/drain  10/09/2013   CLINICAL DATA:  Metastatic colon cancer, obstructive right hydronephrosis  EXAM: ULTRASOUND GUIDANCE FOR NEPHROSTOMY ACCESS  10 FRENCH RIGHT NEPHROSTOMY  Date:  9/25/20159/25/2015 11:13 am  Radiologist:  M. Daryll Brod, MD  Guidance:  Ultrasound and fluoroscopic  FLUOROSCOPY TIME:  42 seconds  MEDICATIONS AND MEDICAL HISTORY: 0.5 mg Versed, 12.5 mcg fentanyl, 2 g Ancef administered within 1 hr of the procedure  ANESTHESIA/SEDATION: 15 min  CONTRAST:  67mL OMNIPAQUE IOHEXOL 300 MG/ML  SOLN  COMPLICATIONS: No immediate  PROCEDURE: Informed consent was obtained from the patient following explanation of the procedure, risks, benefits and alternatives. The patient understands, agrees and consents for the procedure. All questions were addressed. A time out was performed.  Maximal barrier sterile technique utilized including caps, mask, sterile gowns, sterile gloves, large  sterile drape, hand hygiene, and Betadine.  Previous imaging reviewed. Patient positioned prone. Hydronephrotic right kidney was localized. Under sterile conditions and local anesthesia, a 21 gauge access needle was advanced percutaneously  into the right kidney lower pole dilated calyx under direct ultrasound. Images obtained for documentation. There was return of urine. Guidewire inserted followed by Accustick dilator set. Amplatz guidewire advanced. Tract dilatation performed to insert a 10 French nephrostomy. Retention loop formed in the renal pelvis. Images obtained for documentation. Catheter secured with a Prolene suture and connected to external gravity drainage. Sterile dressing applied. No immediate complication. Patient tolerated the procedure well.  IMPRESSION: Successful ultrasound and fluoroscopic 10 French right nephrostomy   Electronically Signed   By: Daryll Brod M.D.   On: 10/09/2013 12:08    Assessment/Plan: (R0hydronephrosis s/p PCN Cr down some, good output from drain Continue to follow    LOS: 9 days    Ascencion Dike PA-C 10/10/2013 9:23 AM

## 2013-10-10 NOTE — Progress Notes (Signed)
[facility] PROGRESS NOTE  Justin Lang. KGM:[id number] DOB: September 07, 1922 DOA: 10/01/2013 PCP: Delphina Cahill, MD  Off Service Summary [phone] who presented with chest pain, found to have likely CAP and ARF with Cr of over 5. On work up, pt was found to have a metastatic lesion from known colon cancer impinge on ureter. Urology following. PCN is planned however pt's INR is markedly elevated. Pt has a hypercoagulable history so our goal is to conservatively allow INR to trend down. Also, pt was found to have new EF of 34% with diastolic dysfunction. Also noted to have brief period of [facility]. Cardiology following. No plans for ICD per family wishes. On beta blocker now. S/p PCN as palliative approach for hydronephrosis.  Assessment/Plan: 1. Chest pain 1. Serial trop were neg 2. 2d echo with no WMA but with new 74-25% EF with diastolic dysfunction; cardiology on board 3. No further intervention/work up anticipated at this moment 2. 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 Pt appears compensated at this time and no complaining of SOB 2. Presenting BNP was markedly elevated, but question if renal failure is contributing to elevated BNP 3. low EF of 20-25%; b-blockers on board (changed now to coreg) 4. TSH was normal 5. Wts have trended as follows: 75kg->76kg->77kg with pos fluid balance, thus IVF have been d/c'd and antibiotics changed to PO. Current weight is 72KG. 6. Hold ACEI or arb secondary to renal failure 7. Due to mild decreased on his BS bibasilar will add low dose lasix 3. CAP 1. Remains afebrile 2. Will finish abx's with augmentin now PO (plan is 3 more days) 3. No leukocytosis 4. Breathing stable 4. AKI 1. improving slowly; most likely due to hydroureter/hydronephrosis  2. Appreciate Urology input and IR PCN tube placement on 9/25 3. Will follow renal function; already coming down (good output) 4. Expecting significant improvement once obstruction is bypass with PCN  tube 5. Metastatic colon ca 1. Followed by Oncology as outpatient 2. Per oncology rec's, will set up hospice at home at discharge 6. Hx DVT 1. Pt with hypercoagulable state, is s/p IVC filter placement 2. Will continue coumadin per pharmacy  7. HTN: will continue coreg, bidil and low dose lasix 8. Coumadin coagulopathy 1. Dosing by pharmacy  2. No major bleeding appreciated during PCN 3. CBC with stable Hgb 9. Wide complex tachycardia 1. Noted during this admit 2. Pt noted to be asymptomatic during this episode 3. Given above dx of EF of 20%, consulted Cardiology 4. Pt currently with pacemaker, not ICD per family wishes 5. Continue b-blocker; cardiology following 6. No further abnormality seen on tele; will discontinue at this point. That will help decreasing degree of agitation.  Code Status: DNR Family Communication: Pt and wife in room Disposition Plan: home with hospice at discharge; most likely on 9/27.   Consultants:  Urology  Cardiology  Procedures:  PCN tube 9/25  Antibiotics:  Rocephin 9/17>>>9/24  Azithromycin 9/17>>>9/24  augmentin 9/24 (will treat for 5 days total)  HPI/Subjective: AAOX1, no much pain or complains from PCN, in no distress, no fever. Denies SOB  Objective: Filed Vitals:   10/09/13 1430 10/09/13 1642 10/09/13 2100 10/10/13 0640  BP: 130/52 128/59 152/74 167/64  Pulse: 59 69 62 83  Temp: 97.4 F (36.3 C)  97.7 F (36.5 C) 97.9 F (36.6 C)  TempSrc: Oral  Oral Oral  Resp: 18  20 18   Height:      Weight:    77.2 kg (170  lb 3.1 oz)  SpO2: 95%  95% 97%    Intake/Output Summary (Last 24 hours) at 10/10/13 1353 Last data filed at 10/10/13 1245  Gross per 24 hour  Intake    222 ml  Output   3100 ml  Net  -2878 ml   Filed Weights   10/08/13 0403 10/09/13 0502 10/10/13 0640  Weight: 77.8 kg (171 lb 8.3 oz) 78.1 kg (172 lb 2.9 oz) 77.2 kg (170 lb 3.1 oz)    Exam:   General:  AAOX1, no much pain or complains from PCN, in no  distress, no fever. Denies SOB  Cardiovascular: regular, s1, s2; no rubs  Respiratory: normal resp effort, no wheezing; mild decrease BS at bases  Abdomen: soft,nondistended; positive BS  Musculoskeletal: perfused, no clubbing, trace edema bilaterally  Data Reviewed: Basic Metabolic Panel:  Recent Labs Lab 10/03/13 2250  10/07/13 0445 10/08/13 0610 10/08/13 1607 10/09/13 0502 10/10/13 0448  NA  --   < > 141 141 141 145 144  K  --   < > 4.5 4.2 4.1 4.3 4.1  CL  --   < > 105 104 105 107 105  CO2  --   < > 18* 18* 20 20 21   GLUCOSE  --   < > 110* 112* 112* 102* 94  BUN  --   < > 43* 47* 48* 50* 47*  CREATININE  --   < > 4.45* 4.29* 4.24* 4.32* 4.07*  CALCIUM  --   < > 9.0 9.4 9.1 9.0 8.9  MG 1.8  --   --   --   --   --   --   < > = values in this interval not displayed. Liver Function Tests:  Recent Labs Lab 10/04/13 0825 10/05/13 0700 10/06/13 0346  AST 17 16 16   ALT 5 6 8   ALKPHOS 57 51 58  BILITOT 0.4 0.3 0.3  PROT 6.1 6.2 6.5  ALBUMIN 2.7* 2.6* 2.9*   CBC:  Recent Labs Lab 10/06/13 0346 10/07/13 0445 10/08/13 0610 10/09/13 0502 10/10/13 0448  WBC 9.4 8.8 8.9 8.4 7.0  NEUTROABS 6.6 6.5 6.7 5.6 4.7  HGB 12.1* 11.9* 11.9* 10.9* 11.1*  HCT 36.4* 37.3* 36.2* 33.0* 33.9*  MCV 90.3 93.5 89.6 89.9 93.1  PLT 232 254 267 251 257   BNP (last 3 results)  Recent Labs  10/01/13 1845  PROBNP 17357.0*   CBG:  Recent Labs Lab 10/08/13 2121  GLUCAP 176*    Recent Results (from the past 240 hour(s))  CULTURE, BLOOD (ROUTINE X 2)     Status: None   Collection Time    10/01/13 11:08 PM      Result Value Ref Range Status   Specimen Description BLOOD ARM LEFT   Final   Special Requests     Final   Value: BOTTLES DRAWN AEROBIC AND ANAEROBIC 10CC AZITHROMYCIN, CEFTRIAXONE   Culture  Setup Time     Final   Value: 10/02/2013 05:41     Performed at [facility]   Culture     Final   Value: NO GROWTH 5 DAYS     Performed at [facility]    Report Status 10/08/2013 FINAL   Final  CULTURE, BLOOD (ROUTINE X 2)     Status: None   Collection Time    10/01/13 11:15 PM      Result Value Ref Range Status   Specimen Description BLOOD HAND LEFT   Final   Special  Requests     Final   Value: BOTTLES DRAWN AEROBIC ONLY 10CC AZITHROMYCIN, CEFTRIAXONE   Culture  Setup Time     Final   Value: 10/02/2013 05:40     Performed at [facility]   Culture     Final   Value: NO GROWTH 5 DAYS     Performed at [facility]   Report Status 10/08/2013 FINAL   Final  URINE CULTURE     Status: None   Collection Time    10/02/13  7:59 AM      Result Value Ref Range Status   Specimen Description URINE, RANDOM   Final   Special Requests NONE   Final   Culture  Setup Time     Final   Value: 10/02/2013 14:34     Performed at [facility]     Final   Value: 6,000 COLONIES/ML     Performed at [facility]   Culture     Final   Value: INSIGNIFICANT GROWTH     Performed at [facility]   Report Status 10/03/2013 FINAL   Final     Studies: Ir Perc Nephrostomy Right  10/09/2013   CLINICAL DATA:  Metastatic colon cancer, obstructive right hydronephrosis  EXAM: ULTRASOUND GUIDANCE FOR NEPHROSTOMY ACCESS  10 FRENCH RIGHT NEPHROSTOMY  Date:  9/25/20159/25/2015 11:13 am  Radiologist:  M. Daryll Brod, MD  Guidance:  Ultrasound and fluoroscopic  FLUOROSCOPY TIME:  42 seconds  MEDICATIONS AND MEDICAL HISTORY: 0.5 mg Versed, 12.5 mcg fentanyl, 2 g Ancef administered within 1 hr of the procedure  ANESTHESIA/SEDATION: 15 min  CONTRAST:  39mL OMNIPAQUE IOHEXOL 300 MG/ML  SOLN  COMPLICATIONS: No immediate  PROCEDURE: Informed consent was obtained from the patient following explanation of the procedure, risks, benefits and alternatives. The patient understands, agrees and consents for the procedure. All questions were addressed. A time out was performed.  Maximal barrier sterile technique utilized including caps,  mask, sterile gowns, sterile gloves, large sterile drape, hand hygiene, and Betadine.  Previous imaging reviewed. Patient positioned prone. Hydronephrotic right kidney was localized. Under sterile conditions and local anesthesia, a 21 gauge access needle was advanced percutaneously into the right kidney lower pole dilated calyx under direct ultrasound. Images obtained for documentation. There was return of urine. Guidewire inserted followed by Accustick dilator set. Amplatz guidewire advanced. Tract dilatation performed to insert a 10 French nephrostomy. Retention loop formed in the renal pelvis. Images obtained for documentation. Catheter secured with a Prolene suture and connected to external gravity drainage. Sterile dressing applied. No immediate complication. Patient tolerated the procedure well.  IMPRESSION: Successful ultrasound and fluoroscopic 10 French right nephrostomy   Electronically Signed   By: Daryll Brod M.D.   On: 10/09/2013 12:08   Ir US Guide Bx Asp/drain  10/09/2013   CLINICAL DATA:  Metastatic colon cancer, obstructive right hydronephrosis  EXAM: ULTRASOUND GUIDANCE FOR NEPHROSTOMY ACCESS  10 FRENCH RIGHT NEPHROSTOMY  Date:  9/25/20159/25/2015 11:13 am  Radiologist:  M. Daryll Brod, MD  Guidance:  Ultrasound and fluoroscopic  FLUOROSCOPY TIME:  42 seconds  MEDICATIONS AND MEDICAL HISTORY: 0.5 mg Versed, 12.5 mcg fentanyl, 2 g Ancef administered within 1 hr of the procedure  ANESTHESIA/SEDATION: 15 min  CONTRAST:  71mL OMNIPAQUE IOHEXOL 300 MG/ML  SOLN  COMPLICATIONS: No immediate  PROCEDURE: Informed consent was obtained from the patient following explanation of the procedure, risks, benefits and alternatives. The patient understands, agrees and consents  for the procedure. All questions were addressed. A time out was performed.  Maximal barrier sterile technique utilized including caps, mask, sterile gowns, sterile gloves, large sterile drape, hand hygiene, and Betadine.  Previous imaging  reviewed. Patient positioned prone. Hydronephrotic right kidney was localized. Under sterile conditions and local anesthesia, a 21 gauge access needle was advanced percutaneously into the right kidney lower pole dilated calyx under direct ultrasound. Images obtained for documentation. There was return of urine. Guidewire inserted followed by Accustick dilator set. Amplatz guidewire advanced. Tract dilatation performed to insert a 10 French nephrostomy. Retention loop formed in the renal pelvis. Images obtained for documentation. Catheter secured with a Prolene suture and connected to external gravity drainage. Sterile dressing applied. No immediate complication. Patient tolerated the procedure well.  IMPRESSION: Successful ultrasound and fluoroscopic 10 French right nephrostomy   Electronically Signed   By: Daryll Brod M.D.   On: 10/09/2013 12:08    Scheduled Meds: . amoxicillin-clavulanate  1 tablet Oral Q12H  . carvedilol  6.25 mg Oral BID WC  . feeding supplement (ENSURE COMPLETE)  237 mL Oral BID BM  . furosemide  40 mg Oral Daily  . isosorbide-hydrALAZINE  1 tablet Oral BID  . sodium chloride  3 mL Intravenous Q12H  . warfarin  7.5 mg Oral ONCE-1800  . Warfarin - Pharmacist Dosing Inpatient   Does not apply q1800   Continuous Infusions:    Principal Problem:   CAP (community acquired pneumonia) Active Problems:   COLON CANCER, HX OF   DVT (deep venous thrombosis), unspecified laterality   Chest pain   AKI (acute kidney injury)   Cardiomyopathy   Obstructive nephropathy = external ureteral obstruction  Time spent: <  35 min  Barton Dubois  [facility] Pager [phone]. If 7PM-7AM, please contact night-coverage at [email or web address], password [facility] 10/10/2013, 1:53 PM  LOS: 9 days

## 2013-10-10 NOTE — Progress Notes (Addendum)
ANTICOAGULATION CONSULT NOTE - Follow Up Consult  Pharmacy Consult for coumadin Indication: hx of DVT, PE and factor 5 leiden  Allergies  Allergen Reactions  . Vectibix [Panitumumab] Other (See Comments)    On 08/20/12 during Vectibix infusion at [facility] developed difficulty swallowing, unable answer questions due to lips/throat feeling "funny", confusion, with no affect. Has difficulty getting words out.  . Celecoxib Rash    CELEBREX    Patient Measurements: Height: 5\' 8"  (172.7 cm) Weight: 170 lb 3.1 oz (77.2 kg) IBW/kg (Calculated) : 68.4 Heparin Dosing Weight:   Vital Signs: Temp: 97.9 F (36.6 C) (09/26 0640) Temp src: Oral (09/26 0640) BP: 167/64 mmHg (09/26 0640) Pulse Rate: 83 (09/26 0640)  Labs:  Recent Labs  10/08/13 0610 10/08/13 1607 10/09/13 0502 10/10/13 0448  HGB 11.9*  --  10.9* 11.1*  HCT 36.2*  --  33.0* 33.9*  PLT 267  --  251 257  APTT  --   --  54*  --   LABPROT 22.6*  --  23.0* 21.7*  INR 1.99*  --  2.04* 1.89*  CREATININE 4.29* 4.24* 4.32* 4.07*    Estimated Creatinine Clearance: 12.1 ml/min (by C-G formula based on Cr of 4.07).   Medications:  Scheduled:  . amoxicillin-clavulanate  1 tablet Oral Q12H  . carvedilol  6.25 mg Oral BID WC  .  ceFAZolin (ANCEF) IV  2 g Intravenous On Call  . feeding supplement (ENSURE COMPLETE)  237 mL Oral BID BM  . furosemide  40 mg Oral Daily  . isosorbide-hydrALAZINE  1 tablet Oral BID  . sodium chloride  3 mL Intravenous Q12H   Infusions:    Assessment: 80 yo male with afib, DVT and factor 5 Leiden is currently on subtherapeutic coumadin because coumadin has been on hold since 10/01/13.  MD okay to resume coumadin tonight. INR today is 1.89 Goal of Therapy:  INR 2-3 Monitor platelets by anticoagulation protocol: Yes   Plan:  Coumadin 7.5 mg po x1 Daily PT/INR.   Baylen Buckner, Tsz-Yin 10/10/2013,10:37 AM

## 2013-10-10 NOTE — Progress Notes (Signed)
Occupational Therapy Treatment Patient Details Name: Infant Zink. MRN: [id number] DOB: 1922-08-15 Today's Date: 10/10/2013    History of present illness 80 y.o. year old male with significant past medical history of stage 4 prostate cancer, SA node dysfunction s/p pacemaker, hx/o DVT s/p IVC filter on coumadin presenting with with chest pain, , CAP, AKI, cardiomyopathy. Per wife pt has had progressive malaise and weakness. Wife reports that pt has not had chemotherapy or radiation for about 10 months as pt cannot tolerate this. Over the past 1-2 days, pt complaining of chest pain and worsening weakness. Also with mild cough and low back/kidney pain.    OT comments  Pt with improved functional transfers and mobility.  Able to perform with min A today.   Follow Up Recommendations  Home health OT;Supervision/Assistance - 24 hour    Equipment Recommendations  None recommended by OT    Recommendations for Other Services      Precautions / Restrictions Precautions Precautions: Fall       Mobility Bed Mobility Overal bed mobility: Needs Assistance Bed Mobility: Sit to Supine     Supine to sit: Min assist     General bed mobility comments: Requires min A to lift trunk   Transfers Overall transfer level: Needs assistance Equipment used: Rolling walker (2 wheeled) Transfers: Sit to/from [facility] Sit to Stand: Min assist Stand pivot transfers: Min assist       General transfer comment: Pt requires light min A to power up and for balance.    Balance Overall balance assessment: Needs assistance Sitting-balance support: Feet supported Sitting balance-Leahy Scale: Fair     Standing balance support: Single extremity supported Standing balance-Leahy Scale: Poor                     ADL       Grooming: Wash/dry face;Oral care;Wash/dry hands;Brushing hair;Minimal assistance;Standing                   Toilet Transfer: Minimal  assistance;Ambulation;Comfort height toilet;RW                    Vision                     Perception     Praxis      Cognition   Behavior During Therapy: Impulsive Overall Cognitive Status: History of cognitive impairments - at baseline       Memory: Decreased short-term memory               Extremity/Trunk Assessment               Exercises     Shoulder Instructions       General Comments      Pertinent Vitals/ Pain       Pain Assessment: No/denies pain  Home Living                                          Prior Functioning/Environment              Frequency Min 2X/week     Progress Toward Goals  OT Goals(current goals can now be found in the care plan section)  Progress towards OT goals: Progressing toward goals  Acute Rehab OT Goals Potential to Achieve Goals: Good ADL Goals Pt Will Perform Grooming: with  supervision;sitting Pt Will Perform Upper Body Bathing: with supervision;sitting Pt Will Perform Lower Body Bathing: with min assist;sit to/from stand Pt Will Perform Upper Body Dressing: with supervision;sitting Pt Will Perform Lower Body Dressing: with min assist;sit to/from stand Pt Will Transfer to Toilet: with min guard assist;ambulating;grab bars;bedside commode Pt Will Perform Toileting - Clothing Manipulation and hygiene: with min guard assist;sit to/from stand  Plan Discharge plan remains appropriate    Co-evaluation                 End of Session     Activity Tolerance     Patient Left     Nurse Communication          Time: 4332-9518 OT Time Calculation (min): 19 min  Charges: OT General Charges $OT Visit: 1 Procedure OT Treatments $Therapeutic Activity: 8-22 mins  Gabryela Kimbrell M 10/10/2013, 2:44 PM

## 2013-10-11 LAB — CBC WITH DIFFERENTIAL/PLATELET
BASOS ABS: 0 10*3/uL (ref 0.0–0.1)
BASOS PCT: 0 % (ref 0–1)
Eosinophils Absolute: 0.6 10*3/uL (ref 0.0–0.7)
Eosinophils Relative: 9 % — ABNORMAL HIGH (ref 0–5)
HCT: 33.7 % — ABNORMAL LOW (ref 39.0–52.0)
HEMOGLOBIN: 11.1 g/dL — AB (ref 13.0–17.0)
Lymphocytes Relative: 21 % (ref 12–46)
Lymphs Abs: 1.5 10*3/uL (ref 0.7–4.0)
MCH: 29.8 pg (ref 26.0–34.0)
MCHC: 32.9 g/dL (ref 30.0–36.0)
MCV: 90.3 fL (ref 78.0–100.0)
Monocytes Absolute: 0.7 10*3/uL (ref 0.1–1.0)
Monocytes Relative: 9 % (ref 3–12)
NEUTROS ABS: 4.3 10*3/uL (ref 1.7–7.7)
NEUTROS PCT: 61 % (ref 43–77)
Platelets: 267 10*3/uL (ref 150–400)
RBC: 3.73 MIL/uL — ABNORMAL LOW (ref 4.22–5.81)
RDW: 15.2 % (ref 11.5–15.5)
WBC: 7.2 10*3/uL (ref 4.0–10.5)

## 2013-10-11 LAB — BASIC METABOLIC PANEL
ANION GAP: 15 (ref 5–15)
BUN: 45 mg/dL — ABNORMAL HIGH (ref 6–23)
CALCIUM: 8.8 mg/dL (ref 8.4–10.5)
CO2: 26 mEq/L (ref 19–32)
Chloride: 104 mEq/L (ref 96–112)
Creatinine, Ser: 4.12 mg/dL — ABNORMAL HIGH (ref 0.50–1.35)
GFR, EST AFRICAN AMERICAN: 14 mL/min — AB (ref >90)
GFR, EST NON AFRICAN AMERICAN: 12 mL/min — AB (ref >90)
Glucose, Bld: 97 mg/dL (ref 70–99)
Potassium: 4.1 mEq/L (ref 3.7–5.3)
Sodium: 145 mEq/L (ref 137–147)

## 2013-10-11 LAB — PROTIME-INR
INR: 1.86 — AB (ref 0.00–1.49)
Prothrombin Time: 21.4 seconds — ABNORMAL HIGH (ref 11.6–15.2)

## 2013-10-11 MED ORDER — ISOSORB DINITRATE-HYDRALAZINE 20-37.5 MG PO TABS: 1.0000 | ORAL_TABLET | Freq: Two times a day (BID) | ORAL | Status: DC

## 2013-10-11 MED ORDER — AMOXICILLIN-POT CLAVULANATE 250-125 MG PO TABS: 1.0000 | ORAL_TABLET | Freq: Two times a day (BID) | ORAL | Status: DC

## 2013-10-11 MED ORDER — HALOPERIDOL 0.5 MG PO TABS: 0.5000 mg | ORAL_TABLET | Freq: Two times a day (BID) | ORAL | Status: AC | PRN

## 2013-10-11 MED ORDER — WARFARIN SODIUM 7.5 MG PO TABS
7.5000 mg | ORAL_TABLET | Freq: Once | ORAL | Status: DC
  Filled 2013-10-11: qty 1

## 2013-10-11 MED ORDER — ISOSORB DINITRATE-HYDRALAZINE 20-37.5 MG PO TABS
1.0000 | ORAL_TABLET | Freq: Two times a day (BID) | ORAL | Status: DC
Start: 2013-10-11 — End: 2014-02-23

## 2013-10-11 MED ORDER — CARVEDILOL 6.25 MG PO TABS: 6.2500 mg | ORAL_TABLET | Freq: Two times a day (BID) | ORAL | Status: DC

## 2013-10-11 MED ORDER — FUROSEMIDE 20 MG PO TABS: 20.0000 mg | ORAL_TABLET | Freq: Every day | ORAL | Status: DC

## 2013-10-11 MED ORDER — MORPHINE SULFATE 20 MG/5ML PO SOLN: 5.0000 mg | ORAL | Status: DC | PRN

## 2013-10-11 MED ORDER — ENSURE COMPLETE PO LIQD: 237.0000 mL | Freq: Two times a day (BID) | ORAL | Status: AC

## 2013-10-11 NOTE — Discharge Summary (Signed)
Physician Discharge Summary  Justin Lang. WJX:[id number] DOB: August 26, 1922 DOA: 10/01/2013  PCP: Delphina Cahill, MD  Admit date: 10/01/2013 Discharge date: 10/11/2013  Time spent: >30 minutes  Recommendations for Outpatient Follow-up:  Close follow up of renal function and electrolytes Coumadin and Hgb levels to be follow by Dr. Benay Spice Hosp[facility] now on board; further declining will required full comfort care and no further hospital admissions, given overall poor prognosis (unless level of comfort can not be achieved either at home or at hospice facility)  Discharge Diagnoses:  Principal Problem:   CAP (community acquired pneumonia) Active Problems:   COLON CANCER, HX OF   DVT (deep venous thrombosis), unspecified laterality   Chest pain   AKI (acute kidney injury)   Cardiomyopathy   Obstructive nephropathy = external ureteral obstruction   Discharge Condition: stable. Will be discharge home with [facility] services, family care and home Hospice.  Diet recommendation: decreased sodium intake  Filed Weights   10/09/13 0502 10/10/13 0640 10/11/13 0526  Weight: 78.1 kg (172 lb 2.9 oz) 77.2 kg (170 lb 3.1 oz) 75.4 kg (166 lb 3.6 oz)    History of present illness:  80 y.o. year old male with significant past medical history of stage 4 prostate cancer, SA node dysfunction s/p pacemaker, hx/o DVT s/p IVC filter on coumadin presenting with with chest pain, , CAP, AKI, cardiomyopathy. Per wife pt has had progressive malaise and weakness. Wife reports that pt has not had chemotherapy or radiation for about 10 months as pt cannot tolerate this. Over the past 1-2 days, pt complaining of chest pain and worsening weakness. Also with mild cough and low back/kidney pain. Wife, pt deny and fevers or chills. No episodes of vomiting. No dysuria. No decreased urination.    Hospital Course:  1. Chest pain  1. Serial trop were neg 2. 2d echo with no WMA but with new 21-30% EF with diastolic dysfunction;  cardiology consulted and with plans of just conservative approach. 3. No further intervention/work up anticipated at this moment 4. b-blockers, low dose lasix and bidil. No ACE or ARB given renal failure 5. At discharge no further CP  2. Likely chronic combined systolic and diastolic CHF  1. Pt appears compensated at discharge 2. Presenting BNP was markedly elevated, but question if renal failure is contributing to elevated BNP 3. low EF of 20-25%; b-blockers and bidil on board 4. TSH was normal 5. Wts and volume to be controlled with low dose of lasix and watching sodium intake 6. Hold ACEI or arb secondary to renal failure No ACE or ARB given renal failure  3. CAP  1. Remains afebrile 2. Will finish abx's with augmentin now PO (plan is 3 more days) 3. No leukocytosis 4. Breathing stable  4. AKI  1. improving slowly; due to hydroureter/hydronephrosis  2. Appreciate Urology input and IR PCN tube placement on 9/25 3. BMET as an outpatient 4. Expecting improvement now that obstruction has been bypass with PCN tube  5. Metastatic colon ca  1. Followed by Oncology as outpatient 2. Per oncology rec's, will set up hospice at home at discharge 3. Concentrates morphine PRN for SOB, pain and comfort.  6. Hx DVT  1. Pt with hypercoagulable state, is s/p IVC filter placement 2. Will continue coumadin   7. Coumadin coagulopathy  1. restarted by pharmacy on 9/26 2. No major bleeding appreciated after/during PCN 3. CBC and coumadin level to check by hospice RN and Dr. Benay Spice for further medication adjustments  4. INR 1.86 at discharge                      8. Physical deconditioning: will arrange PT and RN through hospice.  1. Wide complex tachycardia 2. Pt noted to be asymptomatic during this episode 3. Pt currently with pacemaker, not ICD per family wishes 4. Continue b-blocker   Procedures: PCN tube 9/25  Consultations: Urology  Cardiology  Discharge Exam: Filed Vitals:    10/11/13 0526  BP: 129/47  Pulse: 60  Temp: 97.6 F (36.4 C)  Resp: 18   General: AAOX1, no much pain or complains from PCN, in no distress, no fever or SOB  Cardiovascular: regular, s1, s2; no rubs  Respiratory: normal resp effort, no wheezing; no use of accessory muscles. Improved BS at bases  Abdomen: soft,nondistended; positive BS  Musculoskeletal: perfused, no clubbing, trace edema bilaterally   Discharge Instructions You were cared for by a hospitalist during your hospital stay. If you have any questions about your discharge medications or the care you received while you were in the hospital after you are discharged, you can call the unit and asked to speak with the hospitalist on call if the hospitalist that took care of you is not available. Once you are discharged, your primary care physician will handle any further medical issues. Please note that NO REFILLS for any discharge medications will be authorized once you are discharged, as it is imperative that you return to your primary care physician (or establish a relationship with a primary care physician if you do not have one) for your aftercare needs so that they can reassess your need for medications and monitor your lab values.  Discharge Instructions   Diet - low sodium heart healthy    Complete by:  As directed      Discharge instructions    Complete by:  As directed   Follow up with Dr. Benay Spice at [place] follow up with PCP in 2 weeks Follow less than 2.5 grams of sodium on daily basis Maintain good hydration Hospice nurse to check coumadin level and contact Dr. Benay Spice  Take medications as prescribed          Current Discharge Medication List    START taking these medications   Details  amoxicillin-clavulanate (AUGMENTIN) 250-125 MG per tablet Take 1 tablet by mouth every 12 (twelve) hours. Qty: 6 tablet, Refills: 0    carvedilol (COREG) 6.25 MG tablet Take 1 tablet (6.25 mg total) by mouth 2  (two) times daily with a meal. Qty: 60 tablet, Refills: 1    feeding supplement, ENSURE COMPLETE, (ENSURE COMPLETE) LIQD Take 237 mLs by mouth 2 (two) times daily between meals.    furosemide (LASIX) 20 MG tablet Take 1 tablet (20 mg total) by mouth daily. Qty: 30 tablet, Refills: 1    isosorbide-hydrALAZINE (BIDIL) 20-37.5 MG per tablet Take 1 tablet by mouth 2 (two) times daily. Qty: 60 tablet, Refills: 1    morphine 20 MG/5ML solution Take 1.3 mLs (5.2 mg total) by mouth every 4 (four) hours as needed for pain (SOB and comfort). Qty: 30 mL, Refills: 0      CONTINUE these medications which have NOT CHANGED   Details  diphenoxylate-atropine (LOMOTIL) 2.5-0.025 MG per tablet Take 1 tablet by mouth 4 (four) times daily as needed for diarrhea or loose stools.    Associated Diagnoses: Anticoagulant long-term use; Personal history of venous thrombosis and embolism; Colon cancer  mupirocin ointment (BACTROBAN) 2 % Apply 1 application topically 2 (two) times daily.     !! warfarin (COUMADIN) 2 MG tablet Take 1 tablet (2 mg total) by mouth as directed. Taken with 5mg  tablet on Mon, [place] & Fridays. Qty: 30 tablet, Refills: 1   Associated Diagnoses: Deep vein thrombosis, unspecified laterality    !! warfarin (COUMADIN) 5 MG tablet Take 5 mg by mouth daily except 7 mg on Tues/Thurs or as directed. Qty: 30 tablet, Refills: 3   Associated Diagnoses: DVT (deep venous thrombosis), unspecified laterality     !! - Potential duplicate medications found. Please discuss with provider.    STOP taking these medications     cholecalciferol (VITAMIN D) 1000 UNITS tablet        Allergies  Allergen Reactions  . Vectibix [Panitumumab] Other (See Comments)    On 08/20/12 during Vectibix infusion at [facility] developed difficulty swallowing, unable answer questions due to lips/throat feeling "funny", confusion, with no affect. Has difficulty getting words out.  . Celecoxib Rash    CELEBREX   Follow-up  Information   Follow up with Hospice at [facility]. ([place] to start within 24-48 hours of discharge)    Specialty:  Hospice and Palliative Medicine   Contact information:   [place] [id number] [phone]        The results of significant diagnostics from this hospitalization (including imaging, microbiology, ancillary and laboratory) are listed below for reference.    Significant Diagnostic Studies: Ct Abdomen Pelvis Wo Contrast  10/02/2013   CLINICAL DATA:  Diffuse abdominal pain and chest pain. Assess for malignancy.  EXAM: CT CHEST, ABDOMEN AND PELVIS WITHOUT CONTRAST  TECHNIQUE: Multidetector CT imaging of the chest, abdomen and pelvis was performed following the standard protocol without IV contrast.  COMPARISON:  CT of the abdomen and pelvis performed 03/16/2013, and CT of the chest performed 08/08/2012  FINDINGS: CT CHEST FINDINGS  Small to moderate right and small left pleural effusions are seen. Patchy bilateral airspace opacities are noted, with underlying interstitial prominence, concerning for pulmonary edema. No pneumothorax is seen. No masses are identified, though evaluation is limited given airspace opacification.  Diffuse coronary artery calcifications are seen. A mildly enlarged right paratracheal node is seen, measuring 1.4 cm in short axis. No definite hilar lymphadenopathy is appreciated. The great vessels are grossly unremarkable in appearance. No pericardial effusion is identified. A pacemaker lead is ending at the right ventricle; the pacemaker is seen at the anterior left chest wall.  The thyroid gland is unremarkable. No axillary lymphadenopathy is seen.  No acute osseous abnormalities are identified.  CT ABDOMEN AND PELVIS FINDINGS  A few hypodensities are noted within the liver, measuring up to 2.1 cm in size. These are relatively stable from the prior study and likely benign. A small calcified granuloma is noted near the hepatic  hilum. The spleen is unremarkable in appearance. The patient is status post cholecystectomy, with clips noted at the gallbladder fossa. The pancreas and adrenal glands are unremarkable.  The previously noted peritoneal metastasis at the right lower quadrant has increased in size, now measuring approximately 2.2 x 1.9 cm. This obscures the right ureter at this level, with resultant dilatation of the right ureter and relatively severe right-sided hydronephrosis.  A nonobstructing 6 mm stone is noted within the dilated right ureter, proximal to the mass. There is severe left-sided renal atrophy, with several stones noted at the left renal pelvis, measuring up to 8 mm in size.  No free fluid is identified. Postoperative change is noted about the proximal small bowel. The visualized small bowel is otherwise unremarkable. The stomach is within normal limits. No acute vascular abnormalities are seen. The IVC filter is noted in expected position. Scattered calcification is seen along the abdominal aorta and its branches.  The patient is status post appendectomy. Scattered diverticulosis is noted along the descending and sigmoid colon, without evidence of diverticulitis. Contrast progresses to the level of the rectum. Postoperative change is noted at the proximal sigmoid colon.  The bladder is mildly distended. A prominent bladder calculus is again noted. There is nodular extension of the prostate at the base of the bladder, relatively stable from prior studies. The prostate is enlarged, measuring 5.5 cm in transverse dimension. No inguinal lymphadenopathy is seen.  No acute osseous abnormalities are identified. Facet disease is noted at the lower lumbar spine.  IMPRESSION: 1. Small to moderate right and small left pleural effusions seen. Patchy bilateral airspace opacification, with underlying interstitial prominence, compatible with pulmonary edema. 2. Peritoneal metastasis at the right lower quadrant has increased in  size, now measuring 2.2 x 1.9 cm, versus 1.6 x 1.0 cm on the prior study. This now obscures the right ureter at this level, with resultant dilatation of the right ureter and relatively severe right-sided hydronephrosis. Given the patient's left renal atrophy, this would explain the patient's acute renal failure. 3. Nonobstructing 6 mm stone within the dilated right ureter, proximal to the mass. 4. Severe left-sided renal atrophy, with several stones at the left renal pelvis. 5. Mildly enlarged right paratracheal node measures 1.4 cm in short axis; this is nonspecific in nature. It appears increased in size from 2014. 6. Scattered diverticulosis along the descending and sigmoid colon, without evidence of diverticulitis. 7. Nodular extension of the prostate at the base of the bladder, stable from prior studies. Enlarged prostate seen. 8. Prominent bladder calculus again noted. 9. Diffuse coronary artery calcifications seen. 10. A few hypodensities within the liver, measuring up to 2.1 cm in size, appear relatively stable and may reflect cysts. 11. Scattered calcification along the abdominal aorta and its branches.  These results were called by telephone at the time of interpretation on 10/02/2013 at 5:38 am to [facility] on [facility], who verbally acknowledged these results.   Electronically Signed   By: Garald Balding M.D.   On: 10/02/2013 05:41   Dg Chest 2 View  10/01/2013   CLINICAL DATA:  Chest pain and shortness of breath  EXAM: CHEST  2 VIEW  COMPARISON:  08/20/2012 and prior radiographs  FINDINGS: Cardiomegaly and left-sided pacemaker again noted.  Left lower lobe airspace disease/ consolidation likely represents pneumonia.  There is no evidence of pleural effusion, pneumothorax or pulmonary edema.  No acute bony abnormalities are noted.  IMPRESSION: Left lower lobe airspace disease/consolidation likely representing pneumonia. Radiographic follow-up to resolution is recommended.  Cardiomegaly.   Electronically  Signed   By: Hassan Rowan M.D.   On: 10/01/2013 20:55   Ct Chest Wo Contrast  10/02/2013   CLINICAL DATA:  Diffuse abdominal pain and chest pain. Assess for malignancy.  EXAM: CT CHEST, ABDOMEN AND PELVIS WITHOUT CONTRAST  TECHNIQUE: Multidetector CT imaging of the chest, abdomen and pelvis was performed following the standard protocol without IV contrast.  COMPARISON:  CT of the abdomen and pelvis performed 03/16/2013, and CT of the chest performed 08/08/2012  FINDINGS: CT CHEST FINDINGS  Small to moderate right and small left pleural effusions are seen. Patchy  bilateral airspace opacities are noted, with underlying interstitial prominence, concerning for pulmonary edema. No pneumothorax is seen. No masses are identified, though evaluation is limited given airspace opacification.  Diffuse coronary artery calcifications are seen. A mildly enlarged right paratracheal node is seen, measuring 1.4 cm in short axis. No definite hilar lymphadenopathy is appreciated. The great vessels are grossly unremarkable in appearance. No pericardial effusion is identified. A pacemaker lead is ending at the right ventricle; the pacemaker is seen at the anterior left chest wall.  The thyroid gland is unremarkable. No axillary lymphadenopathy is seen.  No acute osseous abnormalities are identified.  CT ABDOMEN AND PELVIS FINDINGS  A few hypodensities are noted within the liver, measuring up to 2.1 cm in size. These are relatively stable from the prior study and likely benign. A small calcified granuloma is noted near the hepatic hilum. The spleen is unremarkable in appearance. The patient is status post cholecystectomy, with clips noted at the gallbladder fossa. The pancreas and adrenal glands are unremarkable.  The previously noted peritoneal metastasis at the right lower quadrant has increased in size, now measuring approximately 2.2 x 1.9 cm. This obscures the right ureter at this level, with resultant dilatation of the right ureter  and relatively severe right-sided hydronephrosis.  A nonobstructing 6 mm stone is noted within the dilated right ureter, proximal to the mass. There is severe left-sided renal atrophy, with several stones noted at the left renal pelvis, measuring up to 8 mm in size.  No free fluid is identified. Postoperative change is noted about the proximal small bowel. The visualized small bowel is otherwise unremarkable. The stomach is within normal limits. No acute vascular abnormalities are seen. The IVC filter is noted in expected position. Scattered calcification is seen along the abdominal aorta and its branches.  The patient is status post appendectomy. Scattered diverticulosis is noted along the descending and sigmoid colon, without evidence of diverticulitis. Contrast progresses to the level of the rectum. Postoperative change is noted at the proximal sigmoid colon.  The bladder is mildly distended. A prominent bladder calculus is again noted. There is nodular extension of the prostate at the base of the bladder, relatively stable from prior studies. The prostate is enlarged, measuring 5.5 cm in transverse dimension. No inguinal lymphadenopathy is seen.  No acute osseous abnormalities are identified. Facet disease is noted at the lower lumbar spine.  IMPRESSION: 1. Small to moderate right and small left pleural effusions seen. Patchy bilateral airspace opacification, with underlying interstitial prominence, compatible with pulmonary edema. 2. Peritoneal metastasis at the right lower quadrant has increased in size, now measuring 2.2 x 1.9 cm, versus 1.6 x 1.0 cm on the prior study. This now obscures the right ureter at this level, with resultant dilatation of the right ureter and relatively severe right-sided hydronephrosis. Given the patient's left renal atrophy, this would explain the patient's acute renal failure. 3. Nonobstructing 6 mm stone within the dilated right ureter, proximal to the mass. 4. Severe left-sided  renal atrophy, with several stones at the left renal pelvis. 5. Mildly enlarged right paratracheal node measures 1.4 cm in short axis; this is nonspecific in nature. It appears increased in size from 2014. 6. Scattered diverticulosis along the descending and sigmoid colon, without evidence of diverticulitis. 7. Nodular extension of the prostate at the base of the bladder, stable from prior studies. Enlarged prostate seen. 8. Prominent bladder calculus again noted. 9. Diffuse coronary artery calcifications seen. 10. A few hypodensities within the liver, measuring  up to 2.1 cm in size, appear relatively stable and may reflect cysts. 11. Scattered calcification along the abdominal aorta and its branches.  These results were called by telephone at the time of interpretation on 10/02/2013 at 5:38 am to [facility] on [facility], who verbally acknowledged these results.   Electronically Signed   By: Garald Balding M.D.   On: 10/02/2013 05:41   Ir Perc Nephrostomy Right  10/09/2013   CLINICAL DATA:  Metastatic colon cancer, obstructive right hydronephrosis  EXAM: ULTRASOUND GUIDANCE FOR NEPHROSTOMY ACCESS  10 FRENCH RIGHT NEPHROSTOMY  Date:  9/25/20159/25/2015 11:13 am  Radiologist:  M. Daryll Brod, MD  Guidance:  Ultrasound and fluoroscopic  FLUOROSCOPY TIME:  42 seconds  MEDICATIONS AND MEDICAL HISTORY: 0.5 mg Versed, 12.5 mcg fentanyl, 2 g Ancef administered within 1 hr of the procedure  ANESTHESIA/SEDATION: 15 min  CONTRAST:  16mL OMNIPAQUE IOHEXOL 300 MG/ML  SOLN  COMPLICATIONS: No immediate  PROCEDURE: Informed consent was obtained from the patient following explanation of the procedure, risks, benefits and alternatives. The patient understands, agrees and consents for the procedure. All questions were addressed. A time out was performed.  Maximal barrier sterile technique utilized including caps, mask, sterile gowns, sterile gloves, large sterile drape, hand hygiene, and Betadine.  Previous imaging reviewed. Patient  positioned prone. Hydronephrotic right kidney was localized. Under sterile conditions and local anesthesia, a 21 gauge access needle was advanced percutaneously into the right kidney lower pole dilated calyx under direct ultrasound. Images obtained for documentation. There was return of urine. Guidewire inserted followed by Accustick dilator set. Amplatz guidewire advanced. Tract dilatation performed to insert a 10 French nephrostomy. Retention loop formed in the renal pelvis. Images obtained for documentation. Catheter secured with a Prolene suture and connected to external gravity drainage. Sterile dressing applied. No immediate complication. Patient tolerated the procedure well.  IMPRESSION: Successful ultrasound and fluoroscopic 10 French right nephrostomy   Electronically Signed   By: Daryll Brod M.D.   On: 10/09/2013 12:08   Ir US Guide Bx Asp/drain  10/09/2013   CLINICAL DATA:  Metastatic colon cancer, obstructive right hydronephrosis  EXAM: ULTRASOUND GUIDANCE FOR NEPHROSTOMY ACCESS  10 FRENCH RIGHT NEPHROSTOMY  Date:  9/25/20159/25/2015 11:13 am  Radiologist:  M. Daryll Brod, MD  Guidance:  Ultrasound and fluoroscopic  FLUOROSCOPY TIME:  42 seconds  MEDICATIONS AND MEDICAL HISTORY: 0.5 mg Versed, 12.5 mcg fentanyl, 2 g Ancef administered within 1 hr of the procedure  ANESTHESIA/SEDATION: 15 min  CONTRAST:  56mL OMNIPAQUE IOHEXOL 300 MG/ML  SOLN  COMPLICATIONS: No immediate  PROCEDURE: Informed consent was obtained from the patient following explanation of the procedure, risks, benefits and alternatives. The patient understands, agrees and consents for the procedure. All questions were addressed. A time out was performed.  Maximal barrier sterile technique utilized including caps, mask, sterile gowns, sterile gloves, large sterile drape, hand hygiene, and Betadine.  Previous imaging reviewed. Patient positioned prone. Hydronephrotic right kidney was localized. Under sterile conditions and local  anesthesia, a 21 gauge access needle was advanced percutaneously into the right kidney lower pole dilated calyx under direct ultrasound. Images obtained for documentation. There was return of urine. Guidewire inserted followed by Accustick dilator set. Amplatz guidewire advanced. Tract dilatation performed to insert a 10 French nephrostomy. Retention loop formed in the renal pelvis. Images obtained for documentation. Catheter secured with a Prolene suture and connected to external gravity drainage. Sterile dressing applied. No immediate complication. Patient tolerated the procedure well.  IMPRESSION: Successful ultrasound and  fluoroscopic 10 French right nephrostomy   Electronically Signed   By: Daryll Brod M.D.   On: 10/09/2013 12:08   Dg Chest Port 1 View  10/07/2013   CLINICAL DATA:  Pneumonia.  Wheezing.  EXAM: PORTABLE CHEST - 1 VIEW  COMPARISON:  CT, 10/02/2013 and chest radiograph, 10/01/2013.  FINDINGS: Cardiac silhouette is normal in size. Normal mediastinal contours. No convincing hilar masses.  Opacity at the right lung base obscures the right hemidiaphragm. Milder opacity is noted at the left lung base. Based on the recent prior CT, this is consistent with right larger than left pleural effusions with associated atelectasis. There is mild central vascular congestion, but no overt pulmonary edema. Lung base opacity appears increased when compared to the prior chest radiograph.  IMPRESSION: 1. Lung base opacity, mildly increased from the prior chest radiograph. Based on the prior CT, this is due to a combination of right greater left pleural effusions with associated atelectasis. Pneumonia is not excluded but felt less likely. Central vascular congestion without evidence of overt pulmonary edema.   Electronically Signed   By: Lajean Manes M.D.   On: 10/07/2013 10:09   Dg Abd Portable 1v  10/06/2013   CLINICAL DATA:  Abdominal pain  EXAM: PORTABLE ABDOMEN - 1 VIEW  COMPARISON:  CT abdomen  10/02/2013  FINDINGS: There is a bladder calculus along the left side of the bladder. There is right nephrolithiasis. There is no bowel dilatation to suggest obstruction. There is no evidence of pneumoperitoneum, portal venous gas or pneumatosis. There is an IVC filter noted. The osseous structures are unremarkable.  IMPRESSION: No bowel obstruction.  Right nephrolithiasis.  Bladder calculus.   Electronically Signed   By: Kathreen Devoid   On: 10/06/2013 17:49    Microbiology: Recent Results (from the past 240 hour(s))  CULTURE, BLOOD (ROUTINE X 2)     Status: None   Collection Time    10/01/13 11:08 PM      Result Value Ref Range Status   Specimen Description BLOOD ARM LEFT   Final   Special Requests     Final   Value: BOTTLES DRAWN AEROBIC AND ANAEROBIC 10CC AZITHROMYCIN, CEFTRIAXONE   Culture  Setup Time     Final   Value: 10/02/2013 05:41     Performed at [facility]   Culture     Final   Value: NO GROWTH 5 DAYS     Performed at [facility]   Report Status 10/08/2013 FINAL   Final  CULTURE, BLOOD (ROUTINE X 2)     Status: None   Collection Time    10/01/13 11:15 PM      Result Value Ref Range Status   Specimen Description BLOOD HAND LEFT   Final   Special Requests     Final   Value: BOTTLES DRAWN AEROBIC ONLY 10CC AZITHROMYCIN, CEFTRIAXONE   Culture  Setup Time     Final   Value: 10/02/2013 05:40     Performed at [facility]   Culture     Final   Value: NO GROWTH 5 DAYS     Performed at [facility]   Report Status 10/08/2013 FINAL   Final  URINE CULTURE     Status: None   Collection Time    10/02/13  7:59 AM      Result Value Ref Range Status   Specimen Description URINE, RANDOM   Final   Special Requests NONE   Final   Culture  Setup Time     Final   Value: 10/02/2013 14:34     Performed at [facility]     Final   Value: 6,000 COLONIES/ML     Performed at [facility]   Culture     Final   Value:  INSIGNIFICANT GROWTH     Performed at [facility]   Report Status 10/03/2013 FINAL   Final     Labs: Basic Metabolic Panel:  Recent Labs Lab 10/08/13 0610 10/08/13 1607 10/09/13 0502 10/10/13 0448 10/11/13 0420  NA 141 141 145 144 145  K 4.2 4.1 4.3 4.1 4.1  CL 104 105 107 105 104  CO2 18* 20 20 21 26   GLUCOSE 112* 112* 102* 94 97  BUN 47* 48* 50* 47* 45*  CREATININE 4.29* 4.24* 4.32* 4.07* 4.12*  CALCIUM 9.4 9.1 9.0 8.9 8.8   Liver Function Tests:  Recent Labs Lab 10/05/13 0700 10/06/13 0346  AST 16 16  ALT 6 8  ALKPHOS 51 58  BILITOT 0.3 0.3  PROT 6.2 6.5  ALBUMIN 2.6* 2.9*   CBC:  Recent Labs Lab 10/07/13 0445 10/08/13 0610 10/09/13 0502 10/10/13 0448 10/11/13 0420  WBC 8.8 8.9 8.4 7.0 7.2  NEUTROABS 6.5 6.7 5.6 4.7 4.3  HGB 11.9* 11.9* 10.9* 11.1* 11.1*  HCT 37.3* 36.2* 33.0* 33.9* 33.7*  MCV 93.5 89.6 89.9 93.1 90.3  PLT 254 267 251 257 267    BNP (last 3 results)  Recent Labs  10/01/13 1845  PROBNP 17357.0*   CBG:  Recent Labs Lab 10/08/13 2121  GLUCAP 176*    Signed:  Barton Dubois  [facility] 10/11/2013, 12:59 PM

## 2013-10-11 NOTE — Progress Notes (Signed)
Physical Therapy Treatment Patient Details Name: Justin Lang. MRN: [id number] DOB: 1922/06/16 Today's Date: 10/11/2013    History of Present Illness 80 y.o. year old male with significant past medical history of stage 4 prostate cancer, SA node dysfunction s/p pacemaker, hx/o DVT s/p IVC filter on coumadin presenting with with chest pain, , CAP, AKI, cardiomyopathy. Per wife pt has had progressive malaise and weakness. Wife reports that pt has not had chemotherapy or radiation for about 10 months as pt cannot tolerate this. Over the past 1-2 days, pt complaining of chest pain and worsening weakness. Also with mild cough and low back/kidney pain.     PT Comments    Showing progress with mobility and activity tolerance; Will have plenty of family assist; OK for dc home from PT standpoint; would recommend HHPT -- will adhere to what Hospice services can provide.  Follow Up Recommendations  Home health PT;Supervision/Assistance - 24 hour     Equipment Recommendations  None recommended by PT    Recommendations for Other Services       Precautions / Restrictions Precautions Precautions: Fall Restrictions Weight Bearing Restrictions: No    Mobility  Bed Mobility                  Transfers Overall transfer level: Needs assistance Equipment used: Rolling walker (2 wheeled) Transfers: Sit to/from Stand Sit to Stand: Min assist         General transfer comment: Pt requires light min A to power up and for balance.  Ambulation/Gait Ambulation/Gait assistance: Min guard;Min assist Ambulation Distance (Feet): 120 Feet Assistive device: Rolling walker (2 wheeled) Gait Pattern/deviations: Decreased step length - right;Decreased step length - left;Trunk flexed Gait velocity: quite slow   General Gait Details: Slow gait speed indicative of fall risk; Noted tends to go into greater trunk flexed posture with fatigue; cues to lengthen steps and incr Heel  strike   Stairs Stairs:  (Family is confident in ability to help him up his steps )          Wheelchair Mobility    Modified Rankin (Stroke Patients Only)       Balance             Standing balance-Leahy Scale: Poor                      Cognition Arousal/Alertness: Awake/alert Behavior During Therapy: WFL for tasks assessed/performed (less impulsivity noted) Overall Cognitive Status: History of cognitive impairments - at baseline       Memory: Decreased short-term memory              Exercises      General Comments        Pertinent Vitals/Pain Pain Assessment: No/denies pain (just very chilly)    Home Living                      Prior Function            PT Goals (current goals can now be found in the care plan section) Acute Rehab PT Goals Patient Stated Goal: Get stronger and go home PT Goal Formulation: With patient/family Time For Goal Achievement: 10/20/13 Potential to Achieve Goals: Good Progress towards PT goals: Progressing toward goals    Frequency  Min 3X/week    PT Plan Current plan remains appropriate    Co-evaluation             End of Session  Equipment Utilized During Treatment: Gait belt Activity Tolerance: Patient tolerated treatment well Patient left: in chair;with chair alarm set;with family/visitor present;with call bell/phone within reach     Time: 8208-1388 PT Time Calculation (min): 23 min  Charges:  $Gait Training: 23-37 mins                    G Codes:      Quin Hoop 10/11/2013, 12:32 PM  Roney Marion, [place] Pager [phone] Office [phone]

## 2013-10-11 NOTE — Progress Notes (Signed)
ANTICOAGULATION CONSULT NOTE - Follow Up Consult  Pharmacy Consult for coumadin Indication: hx of DVT, PE and factor 5 leiden deficiency  Allergies  Allergen Reactions  . Vectibix [Panitumumab] Other (See Comments)    On 08/20/12 during Vectibix infusion at [facility] developed difficulty swallowing, unable answer questions due to lips/throat feeling "funny", confusion, with no affect. Has difficulty getting words out.  . Celecoxib Rash    CELEBREX    Patient Measurements: Height: 5\' 8"  (172.7 cm) Weight: 166 lb 3.6 oz (75.4 kg) IBW/kg (Calculated) : 68.4 Heparin Dosing Weight:   Vital Signs: Temp: 97.6 F (36.4 C) (09/27 0526) Temp src: Oral (09/27 0526) BP: 129/47 mmHg (09/27 0526) Pulse Rate: 60 (09/27 0526)  Labs:  Recent Labs  10/09/13 0502 10/10/13 0448 10/11/13 0420  HGB 10.9* 11.1* 11.1*  HCT 33.0* 33.9* 33.7*  PLT 251 257 267  APTT 54*  --   --   LABPROT 23.0* 21.7* 21.4*  INR 2.04* 1.89* 1.86*  CREATININE 4.32* 4.07* 4.12*    Estimated Creatinine Clearance: 12 ml/min (by C-G formula based on Cr of 4.12).   Medications:  Scheduled:  . amoxicillin-clavulanate  1 tablet Oral Q12H  . carvedilol  6.25 mg Oral BID WC  . feeding supplement (ENSURE COMPLETE)  237 mL Oral BID BM  . furosemide  40 mg Oral Daily  . isosorbide-hydrALAZINE  1 tablet Oral BID  . sodium chloride  3 mL Intravenous Q12H  . Warfarin - Pharmacist Dosing Inpatient   Does not apply q1800   Infusions:    Assessment: 80 yo male with hx of DVT, PE and factor 5 leiden deficiency is currently on subtherapeutic coumadin but has stabilized.  Patient was off coumadin since 09/17 and was restarted yesterday Goal of Therapy:  INR 2-3 Monitor platelets by anticoagulation protocol: Yes   Plan:  Coumadin 7.5 mg po x1 (will prob need to go back down to 5 tom) Daily PT/INR.   Justin Lang, Justin Lang 10/11/2013,10:31 AM

## 2013-10-12 ENCOUNTER — Ambulatory Visit (INDEPENDENT_AMBULATORY_CARE_PROVIDER_SITE_OTHER): Payer: Medicare Other | Admitting: Pharmacist

## 2013-10-12 ENCOUNTER — Ambulatory Visit: Payer: Medicare Other | Admitting: Pharmacist

## 2013-10-12 ENCOUNTER — Telehealth: Payer: Self-pay | Admitting: *Deleted

## 2013-10-12 ENCOUNTER — Ambulatory Visit: Payer: Medicare Other | Admitting: Oncology

## 2013-10-12 ENCOUNTER — Ambulatory Visit: Payer: Medicare Other

## 2013-10-12 ENCOUNTER — Other Ambulatory Visit: Payer: Medicare Other

## 2013-10-12 ENCOUNTER — Ambulatory Visit (HOSPITAL_BASED_OUTPATIENT_CLINIC_OR_DEPARTMENT_OTHER): Payer: Medicare Other

## 2013-10-12 DIAGNOSIS — C189 Malignant neoplasm of colon, unspecified: Secondary | ICD-10-CM

## 2013-10-12 DIAGNOSIS — Z86718 Personal history of other venous thrombosis and embolism: Secondary | ICD-10-CM

## 2013-10-12 DIAGNOSIS — Z86711 Personal history of pulmonary embolism: Secondary | ICD-10-CM

## 2013-10-12 DIAGNOSIS — I82409 Acute embolism and thrombosis of unspecified deep veins of unspecified lower extremity: Secondary | ICD-10-CM

## 2013-10-12 LAB — POCT INR: INR: 2

## 2013-10-12 LAB — PROTIME-INR
INR: 2 (ref 2.00–3.50)
Protime: 24 Seconds — ABNORMAL HIGH (ref 10.6–13.4)

## 2013-10-12 NOTE — Telephone Encounter (Signed)
Call from [place] with [facility] reporting pt has not been admitted yet. Sons are requesting to meet with hospice to discuss services prior to pt being admitted. Requested they arrange meeting as soon as possible. Juliann Pulse will contact son.

## 2013-10-12 NOTE — Progress Notes (Signed)
INR = 2 Pt discharged yesterday from hospital (CAP & AKI; s/p nephrostomy tube placement).  He is on Augmentin until Wed (9/30). On admission to hospital 9/17, pts INR >4 and Coumadin was held until 9/26 (given 7.5 mg) and 9/27 (given 7.5 mg) INR = 1.86 yesterday. I have discussed w/ Dr. Benay Spice & we expect pt will need considerably lower dose than his previous dose of 5 mg/day; 7 mg Tu/Th given his supratherapeutic INR on admission to hospital & that he is on Augmentin. I have s/w pts wife, Bethena Roys over the phone & at instruction of Dr. Benay Spice, pt will stay off Coumadin until his Augmentin is complete.   Pts wife reports the family has not decided finally on Hospice.  They still need to meet w/ Hospice RN and have a few questions answered before they decide.  They should have a decision by tomorrow.  Until then, we will plan tentatively to check pts INR again here on Thurs (10/1).   If pt is admitted to Hospice, pts wife knows we will need a contact number for the nurse so that we can coordinate INR checks in the home as long as Coumadin is continued.  Bethena Roys will call us w/ their decision. NO CHARGE - phone encounter. Kennith Center, Pharm.D., CPP 10/12/2013@3 :34 PM

## 2013-10-12 NOTE — Progress Notes (Signed)
UR completed - retro  Deandrae Wajda K. Esmeralda Malay, RN, BSN, [place], CCM  10/12/2013 12:20 PM

## 2013-10-13 ENCOUNTER — Other Ambulatory Visit (HOSPITAL_COMMUNITY): Payer: Self-pay | Admitting: Oncology

## 2013-10-14 ENCOUNTER — Telehealth: Payer: Self-pay | Admitting: Pharmacist

## 2013-10-14 NOTE — Telephone Encounter (Signed)
Pt's family called to cancel appointment, he is going to start Hospice tomorrow. Hospice will draw INR at the patient 's home and call us with results.

## 2013-10-15 ENCOUNTER — Telehealth: Payer: Self-pay | Admitting: *Deleted

## 2013-10-15 ENCOUNTER — Telehealth: Payer: Self-pay | Admitting: Internal Medicine

## 2013-10-15 ENCOUNTER — Ambulatory Visit: Payer: Medicare Other

## 2013-10-15 ENCOUNTER — Ambulatory Visit: Payer: Medicare Other | Admitting: Pharmacist

## 2013-10-15 ENCOUNTER — Other Ambulatory Visit: Payer: Medicare Other

## 2013-10-15 DIAGNOSIS — I82409 Acute embolism and thrombosis of unspecified deep veins of unspecified lower extremity: Secondary | ICD-10-CM

## 2013-10-15 DIAGNOSIS — C189 Malignant neoplasm of colon, unspecified: Secondary | ICD-10-CM

## 2013-10-15 LAB — POCT INR: INR: 1.4

## 2013-10-15 NOTE — Telephone Encounter (Signed)
New message     Pt bp is 88/58, yesterday it was 120/68.  Heart rate is 62 today.  He is feeling weak today.  They are not giving pt any bp or fluid pills.  Please advise on what they should continue to do

## 2013-10-15 NOTE — Telephone Encounter (Signed)
I left hospice nurse a message that I needed clarification as to what they want

## 2013-10-15 NOTE — Telephone Encounter (Signed)
Usual BP runs about 120/70. Checked today and was 88/58 (confirmed). Currently on Lasix, Coreg, Bidil per Dr. Crissie Sickles, CD. Asking for MD to advise.

## 2013-10-15 NOTE — Telephone Encounter (Signed)
Per Dr. Benay Spice : Probably OK to d/c cardiac drugs, but suggests they contact his cardiologist, Dr. Crissie Sickles for advice. Notified Abigail Butts in [facility] triage of orders.

## 2013-10-15 NOTE — Progress Notes (Signed)
*  Telephone encounter - No charge* INR below goal today at 1.4 after patient held doses as instructed earlier this week Pt recently on Augmentin which completed on Wednesday 10/14/13 Dr. Benay Spice had recommended holding coumadin until this date We will reinitiate at lower dose now as patient's appetite is greatly decreased Pt now being seen by hospice nurse INR drawn at patient home today and called into coumadin clinic I spoke to patient's wife, Bethena Roys over the phone Plan: Restart at 2.5 mg daily (half tablet). This is < 50% of original dose of 5 mg daily except for 7 mg 2 days a week Will be conservative with dosing due to appetite and elevated INR previously prior to hospital admit Recheck INR next week with Hospice nurse during home visit. Hospice Nurse RN Santiago Glad [phone]) to call to set up time (I called and left a VM requesting this today).

## 2013-10-15 NOTE — Patient Instructions (Signed)
INR below goal after holding doses Restart at lower dose of 2.5 mg (half tablet) daily Recheck next week with Hospice nurse during visit. Hospice Nurse RN Santiago Glad [phone]) to call to set up time.

## 2013-10-16 NOTE — Telephone Encounter (Signed)
Follow up    Returning call back to nurse - regarding medication

## 2013-10-16 NOTE — Telephone Encounter (Signed)
Spoke with Hospice RN and they had advised to hold Cardiology medications as his BP was so low and he is very weak.  He is palliative care and I let the nurse know that Dr Lovena Le usually lets the hospice MD follow at that point.  He is going to stop his cardiac medications as of today

## 2013-10-20 ENCOUNTER — Ambulatory Visit: Payer: Medicare Other | Admitting: Pharmacist

## 2013-10-20 ENCOUNTER — Telehealth: Payer: Self-pay | Admitting: Internal Medicine

## 2013-10-20 DIAGNOSIS — I82409 Acute embolism and thrombosis of unspecified deep veins of unspecified lower extremity: Secondary | ICD-10-CM

## 2013-10-20 DIAGNOSIS — C189 Malignant neoplasm of colon, unspecified: Secondary | ICD-10-CM

## 2013-10-20 NOTE — Telephone Encounter (Signed)
New message     Pt is due to have a remote device chedk on 10-7.  They do not have a working land line phone and patient is now under hospice.  They want to cancel their 10-21-13 remote check and will call you to let you know if it will be rescheduled.

## 2013-10-20 NOTE — Telephone Encounter (Signed)
Pt's spouse states device was checked in hospital recently. Spouse confirms no landline available. In the past, they transmitted from another location. Spouse states it takes up to 4 people to move pt. Spouse also confirmed pt is under in-home hospice. Spouse will call device clinic if further pacemaker concerns arise. Spouse understands further device follow up will be her responsibility.

## 2013-10-20 NOTE — Patient Instructions (Signed)
INR below goal Increase slightly to 2.5 mg daily except for 5 mg on Tuesdays and Fridays.  Recheck next week with Hospice nurse during visit. Hospice Nurse RN Santiago Glad [phone]) to call to set up time.

## 2013-10-20 NOTE — Progress Notes (Signed)
*  Telephone encounter - No charge* INR below goal Spoke to Hospice home health nurse, Santiago Glad over the phone Pt is doing well with slight improvement in appetite INR unchanges since starting back at lower dose Will make slight dose increase but will be conservative at this point No missed or extra doses No unusual bleeding or bruising No other medication changes Plan: Increase slightly to 2.5 mg daily except for 5 mg on Tuesdays and Fridays.  Recheck next week with Hospice nurse during visit. Hospice Nurse RN Santiago Glad [phone]) to call to set up time.

## 2013-10-21 ENCOUNTER — Telehealth: Payer: Self-pay | Admitting: Cardiology

## 2013-10-21 ENCOUNTER — Encounter: Payer: Medicare Other | Admitting: *Deleted

## 2013-10-21 NOTE — Telephone Encounter (Signed)
LMOVM reminding pt to send remote transmission.   

## 2013-10-23 ENCOUNTER — Telehealth: Payer: Self-pay | Admitting: *Deleted

## 2013-10-23 ENCOUNTER — Encounter: Payer: Self-pay | Admitting: Cardiology

## 2013-10-23 MED ORDER — INFLUENZA VAC SPLIT QUAD 0.5 ML IM KIT: 0.5000 mL | PACK | Freq: Once | INTRAMUSCULAR | Status: DC

## 2013-10-23 NOTE — Telephone Encounter (Signed)
Message from hospice RN Dot Been [phone]) requesting flu shot to be called in to [facility]. Family is requesting flu shot, hospice will not be getting any in. RN will administer in the home.

## 2013-10-27 ENCOUNTER — Telehealth: Payer: Self-pay | Admitting: Pharmacist

## 2013-10-27 NOTE — Telephone Encounter (Signed)
Hospice RN called to inform us she did not draw Justin Lang's INR today.  She will get it tomorrow (10/28/13). Kennith Center, Pharm.D., CPP 10/27/2013@3 :45 PM

## 2013-10-28 ENCOUNTER — Ambulatory Visit: Payer: Self-pay | Admitting: Pharmacist

## 2013-10-28 DIAGNOSIS — I82409 Acute embolism and thrombosis of unspecified deep veins of unspecified lower extremity: Secondary | ICD-10-CM

## 2013-10-28 DIAGNOSIS — C189 Malignant neoplasm of colon, unspecified: Secondary | ICD-10-CM

## 2013-10-28 LAB — POCT INR: INR: 1.2

## 2013-10-28 NOTE — Patient Instructions (Signed)
Increase coumadin to 5mg  daily except 2.5mg  on Sat & Mon.  Recheck next week with Hospice nurse during visit (likely on 10/20).  Hospice Nurse RN Santiago Glad [phone]) to call to set up time.

## 2013-10-28 NOTE — Progress Notes (Signed)
INR = 1.2. INR below goal. Pt took coumadin as instructed. No missed or extra coumadin doses. No changes in diet or medications. Pt has an Ensure drink for lunch. He doesn't eat all that much per wife's report. No problems or concerns regarding anticoagulation. No s/s of clotting noted. Home Health RN looked at patient's leg's today. No unusual bruising. No bleeding noted. INR decreased despite increasing coumadin dose last week. Increase coumadin to 5mg  daily except 2.5mg  on Sat & Mon.  Recheck next week with Hospice nurse during visit (likely on 10/20).  Hospice Nurse RN Santiago Glad [phone]) to call to set up time.  No charge encounter - spoke to patient's wife by telephone.

## 2013-10-29 ENCOUNTER — Telehealth: Payer: Self-pay | Admitting: Pharmacist

## 2013-10-29 NOTE — Telephone Encounter (Signed)
Santiago Glad from Hospice called to verify that next INR would be drawn 11/03/13.   She will call the Coumadin clinic with results.

## 2013-10-30 ENCOUNTER — Telehealth: Payer: Self-pay | Admitting: Internal Medicine

## 2013-10-30 ENCOUNTER — Telehealth: Payer: Self-pay | Admitting: *Deleted

## 2013-10-30 NOTE — Telephone Encounter (Signed)
New message      Pt does not have a home phone and pt is not physically able to get to office to get device checked.  Want you to know wife will call us and let us know when they get a home phone

## 2013-10-30 NOTE — Telephone Encounter (Signed)
Notified pt's wife Bethena Roys that pt has appt Tuesday 11/03/13 @ 9:45; is pt able to make this appt?  Per Bethena Roys; "yes" pt will be there to see Dr. Benay Spice.

## 2013-10-30 NOTE — Telephone Encounter (Signed)
Spoke with pt hospice nurse and she stated that the pt is not going to rush to get a home phone. I informed her that we like to check pacemakers every 6 months and I asked that if she could give [place] an update around January 2016. She stated that she would.

## 2013-10-30 NOTE — Telephone Encounter (Signed)
LMOVM for hospice nurse to return call.

## 2013-11-03 ENCOUNTER — Ambulatory Visit: Payer: Medicare Other | Admitting: Pharmacist

## 2013-11-03 ENCOUNTER — Telehealth: Payer: Self-pay | Admitting: Oncology

## 2013-11-03 ENCOUNTER — Ambulatory Visit (HOSPITAL_BASED_OUTPATIENT_CLINIC_OR_DEPARTMENT_OTHER): Payer: Medicare Other | Admitting: Oncology

## 2013-11-03 ENCOUNTER — Ambulatory Visit (HOSPITAL_BASED_OUTPATIENT_CLINIC_OR_DEPARTMENT_OTHER)

## 2013-11-03 ENCOUNTER — Telehealth: Payer: Self-pay | Admitting: *Deleted

## 2013-11-03 VITALS — BP 136/77 | HR 62 | Temp 98.2°F | Resp 18 | Ht 68.0 in | Wt 160.8 lb

## 2013-11-03 DIAGNOSIS — C180 Malignant neoplasm of cecum: Secondary | ICD-10-CM | POA: Diagnosis not present

## 2013-11-03 DIAGNOSIS — I2699 Other pulmonary embolism without acute cor pulmonale: Secondary | ICD-10-CM

## 2013-11-03 DIAGNOSIS — I429 Cardiomyopathy, unspecified: Secondary | ICD-10-CM

## 2013-11-03 DIAGNOSIS — C181 Malignant neoplasm of appendix: Secondary | ICD-10-CM | POA: Diagnosis not present

## 2013-11-03 DIAGNOSIS — I82409 Acute embolism and thrombosis of unspecified deep veins of unspecified lower extremity: Secondary | ICD-10-CM

## 2013-11-03 DIAGNOSIS — N179 Acute kidney failure, unspecified: Secondary | ICD-10-CM | POA: Diagnosis not present

## 2013-11-03 DIAGNOSIS — C786 Secondary malignant neoplasm of retroperitoneum and peritoneum: Secondary | ICD-10-CM | POA: Diagnosis not present

## 2013-11-03 DIAGNOSIS — C189 Malignant neoplasm of colon, unspecified: Secondary | ICD-10-CM

## 2013-11-03 DIAGNOSIS — N138 Other obstructive and reflux uropathy: Secondary | ICD-10-CM

## 2013-11-03 DIAGNOSIS — Z23 Encounter for immunization: Secondary | ICD-10-CM

## 2013-11-03 DIAGNOSIS — Z808 Family history of malignant neoplasm of other organs or systems: Secondary | ICD-10-CM

## 2013-11-03 LAB — PROTIME-INR
INR: 1.8 — ABNORMAL LOW (ref 2.00–3.50)
PROTIME: 21.6 s — AB (ref 10.6–13.4)

## 2013-11-03 LAB — POCT INR: INR: 1.8

## 2013-11-03 MED ORDER — INFLUENZA VAC SPLIT QUAD 0.5 ML IM SUSY
0.5000 mL | PREFILLED_SYRINGE | Freq: Once | INTRAMUSCULAR | Status: AC
  Administered 2013-11-03: 0.5 mL via INTRAMUSCULAR
  Filled 2013-11-03: qty 0.5

## 2013-11-03 NOTE — Patient Instructions (Signed)

## 2013-11-03 NOTE — Telephone Encounter (Signed)
gv adn printeda ppt sched and avs for pt for NOV.Marland KitchenMarland Kitchen

## 2013-11-03 NOTE — Progress Notes (Signed)
INR = 1.8   Goal 2-3 INR rising towards goal range. No complications noted. Patient has less appetite, drinks Ensure for lunch. He now has Hospice, but had an MD appt today. He is tired but in good spirits. Will increase coumadin to 5mg  daily except 2.5mg  on Mon. We will recheck next week with Hospice nurse during visit (likely on 10/27). Hospice Nurse RN Santiago Glad [phone]) to call to set up time.  We will call Mrs. Gemma with results.  Theone Murdoch, PharmD

## 2013-11-03 NOTE — Telephone Encounter (Signed)
Dr. Benay Spice spoke with PA, Lennette Bihari in [place] department. Reviewed case with radiologist and they could revise his tube if he prefers. Would include insertion of another nephrostomy tube at the same site as current tube. Would place a stent in the ureter and then cap it off the external tube to see how he does. Would come back 1 week or so later to inject it with dye to be sure it is OK. If good, can then pull the tube and leave the internal stent in place. Requested she call office to discuss and then [place] can decide if he wants to pursue this.

## 2013-11-03 NOTE — Progress Notes (Signed)
  [place] OFFICE PROGRESS NOTE   Diagnosis: Colon cancer  INTERVAL HISTORY:   Justin Lang returns as scheduled. He was admitted last month with failure to thrive and was found to have acute renal failure. A CT confirmed an increased peritoneal metastasis causing obstruction of the right ureter. He underwent placement of a percutaneous nephrostomy tube after Coumadin anticoagulation was held.  He is now enrolled in the [facility] program. The nephrostomy site has been leaking urine prompting placement of am ostomy bag around the site. He has pain at the nephrostomy site. He uses Roxanol for pain. No other complaint. His appetite is poor.  Objective:  Vital signs in last 24 hours:  Blood pressure 136/77, pulse 62, temperature 98.2 F (36.8 C), temperature source Oral, resp. rate 18, height _0  (1.727 m), weight 160 lb 12.8 oz (72.938 kg).    HEENT: No thrush Resp: Lungs clear bilaterally Cardio: Regular rate and rhythm GI: No hepatomegaly, nontender, soft Vascular: The left lower leg is slightly larger than the right side, no erythema  Skin: Right nephrostomy tube site with a covering ostomy bag     Lab Results:  Lab Results  Component Value Date   WBC 7.2 10/11/2013   HGB 11.1* 10/11/2013   HCT 33.7* 10/11/2013   MCV 90.3 10/11/2013   PLT 267 10/11/2013   NEUTROABS 4.3 10/11/2013    Medications: I have reviewed the patient's current medications.  Assessment/Plan: 1. Metastatic colorectal cancer, status post resection of a cecum/appendiceal mass in November 2013 (T4 N0), K-ras wild-type  -Biopsy of a peritoneal nodule 07/04/2012 confirmed metastatic colorectal cancer  -cycle 1 panitumumab 08/20/2012  -CT abdomen/pelvis 03/16/2013 without evidence of progressive colon cancer  -CT abdomen/pelvis 10/02/2013 with an increased peritoneal masses causing right ureter obstruction  2. Sigmoid colon cancer in 1997, status post a partial course of adjuvant  chemotherapy (? 5-FU/leucovorin), discontinued secondary to a cardiovascular event  3. Resection of a periaortic lymph node in 2002 with the pathology confirming a moderately differentiated carcinoma  4. History of a DVT/pulmonary embolism, status post IVC filter placement, maintained on Coumadin , followed at the [place] [facility]  5. Arrhythmia, status post pacemaker placement , followed by cardiology  6. cardiomyopathy-EF 20-25% on echocardiogram 10/02/2013  7. acute renal failure secondary to obstructive nephropathy , status post placement of a percutaneous nephrostomy tube 10/09/2013   Disposition:  Justin Lang has enrolled in the [facility] program. He has progressive metastatic colorectal cancer causing renal obstruction. He appears comfortable today. He will continue followup at the [facility] anticoagulation clinic. He will return for an office visit in one month. I will contact interventional radiology to discuss the indication for placement of a ureter stent. Justin Lang received an influenza vaccine today.  Betsy Coder, MD  11/03/2013  10:12 AM

## 2013-11-10 ENCOUNTER — Telehealth: Payer: Self-pay | Admitting: *Deleted

## 2013-11-10 ENCOUNTER — Ambulatory Visit (INDEPENDENT_AMBULATORY_CARE_PROVIDER_SITE_OTHER): Payer: Self-pay | Admitting: Pharmacist

## 2013-11-10 DIAGNOSIS — I82409 Acute embolism and thrombosis of unspecified deep veins of unspecified lower extremity: Secondary | ICD-10-CM

## 2013-11-10 DIAGNOSIS — C189 Malignant neoplasm of colon, unspecified: Secondary | ICD-10-CM

## 2013-11-10 LAB — POCT INR: INR: 3.1

## 2013-11-10 NOTE — Progress Notes (Signed)
**  Telephone [employer]** I spoke with Santiago Glad, Barr[employer] clerk.  INR right above goal of 2-3 after slight coumadin dose increase last week.  No changes in meds.  No bleeding or bruising.  Appetite is unchanged and eats small meals.  Also drinks Ensure.  Will continue current coumadin dose of 5mg  daily but 2.5mg  on Monday.  Hospice RN will check PT/INR in 1 week.

## 2013-11-10 NOTE — Telephone Encounter (Signed)
Call from pt's wife returning call from yesterday re: nephrostomy tube.

## 2013-11-12 NOTE — Telephone Encounter (Signed)
Spoke with Justin Lang, she requests we clear stent procedure with hospice prior to pursuing this. Also asks if IR will perform procedure with pt's cardiac function?  Called hospice, care manager will look into coverage for this procedure.

## 2013-11-13 ENCOUNTER — Telehealth: Payer: Self-pay | Admitting: *Deleted

## 2013-11-13 DIAGNOSIS — C189 Malignant neoplasm of colon, unspecified: Secondary | ICD-10-CM

## 2013-11-13 MED ORDER — CIPROFLOXACIN HCL 250 MG PO TABS: 250.0000 mg | ORAL_TABLET | Freq: Every day | ORAL | Status: DC

## 2013-11-13 MED ORDER — DOXYCYCLINE HYCLATE 100 MG PO TABS: ORAL_TABLET | ORAL | Status: DC

## 2013-11-13 NOTE — Telephone Encounter (Addendum)
Received message from Santiago Glad, [place] with hospice: Pt is reporting dysuria when he passes urine (about twice/ day). Most output is via nephrostomy and that urine is clear and without odor. Asking if Dr. Benay Spice will prescribe antibiotic?  Message from [place] with hospice, they will cover stent procedure if necessary.  Reviewed with Dr. Benay Spice: Order received for Cipro daily. Per Dr. Benay Spice: IR should be able to perform stent procedure with pt's cardiac function.

## 2013-11-13 NOTE — Telephone Encounter (Signed)
Canceled Cipro Rx- due to Coumadin anticoagulation. Will await UA/ culture. Silvano Bilis, Hospice RN with order for UA. She will have results sent to office.

## 2013-11-13 NOTE — Telephone Encounter (Signed)
VERBAL ORDER AND READ BACK TO DR.SHERRILL- NO NEED TO COLLECT URINE SPECIMEN. SEE MEDICATION LIST FOR NEW ORDER. NOTIFIED KAREN HICKS,RN OF THE ABOVE INSTRUCTIONS AND THAT THE DOXYCYCLINE PRESCRIPTION WILL BE SENT TO [facility] ON [address] [place].

## 2013-11-16 ENCOUNTER — Telehealth: Payer: Self-pay | Admitting: *Deleted

## 2013-11-16 MED ORDER — MORPHINE SULFATE (CONCENTRATE) 20 MG/ML PO SOLN: 5.0000 mg | ORAL | Status: AC | PRN

## 2013-11-16 NOTE — Telephone Encounter (Signed)
Needs refill on roxanol (20 mg/ml)-send to [place] please. Asking if OK to call in Ativan 0.5 mg every 4 hours prn-OK

## 2013-11-17 ENCOUNTER — Telehealth: Payer: Self-pay | Admitting: *Deleted

## 2013-11-17 ENCOUNTER — Ambulatory Visit: Payer: Medicare Other | Admitting: Pharmacist

## 2013-11-17 ENCOUNTER — Other Ambulatory Visit: Payer: Self-pay | Admitting: *Deleted

## 2013-11-17 DIAGNOSIS — I82409 Acute embolism and thrombosis of unspecified deep veins of unspecified lower extremity: Secondary | ICD-10-CM

## 2013-11-17 DIAGNOSIS — N138 Other obstructive and reflux uropathy: Secondary | ICD-10-CM

## 2013-11-17 DIAGNOSIS — C189 Malignant neoplasm of colon, unspecified: Secondary | ICD-10-CM

## 2013-11-17 LAB — POCT INR: INR: 4.9

## 2013-11-17 NOTE — Telephone Encounter (Signed)
Message from [place], [facility] RN reporting pt and wife are willing to have stent procedure done. Asking to have this scheduled. Request to Dr. Benay Spice for orders.

## 2013-11-17 NOTE — Telephone Encounter (Signed)
Attempted to call pt's wife to inform her that radiology will contact her to schedule evaluation for stent placement. No answer.

## 2013-11-17 NOTE — Patient Instructions (Signed)
INR above goal due to antibiotic use Hold coumadin x 2 days (tues and Wed) then change coumadin to 5mg  daily except 2.5mg  on Mon and Thurs.  Recheck next week with Hospice nurse, Santiago Glad [phone]).

## 2013-11-17 NOTE — Progress Notes (Signed)
INR above goal today at 4.9. Pt is doing well with no complaints *No charge encounter - Telephone encounter* Spoke with Santiago Glad, Hospice RN. Pt is currently on Day 4/5 of doxycycline which has likely caused the INR to increase No missed or extra doses No diet or other med changes No unusual bleeding or bruising Plan: Hold coumadin x 2 days (tues and Wed) then change coumadin to 5mg  daily except 2.5mg  on Mon and Thurs.  Recheck next week with Hospice nurse, Santiago Glad [phone]).

## 2013-11-19 ENCOUNTER — Other Ambulatory Visit: Payer: Self-pay | Admitting: Oncology

## 2013-11-19 ENCOUNTER — Ambulatory Visit (HOSPITAL_COMMUNITY)
Admission: RE | Admit: 2013-11-19 | Discharge: 2013-11-19 | Disposition: A | Discharge status: Home / Self Care | Source: Ambulatory Visit | Attending: Oncology | Admitting: Oncology

## 2013-11-19 DIAGNOSIS — N135 Crossing vessel and stricture of ureter without hydronephrosis: Secondary | ICD-10-CM | POA: Diagnosis not present

## 2013-11-19 DIAGNOSIS — Z85038 Personal history of other malignant neoplasm of large intestine: Secondary | ICD-10-CM | POA: Insufficient documentation

## 2013-11-19 DIAGNOSIS — N138 Other obstructive and reflux uropathy: Secondary | ICD-10-CM

## 2013-11-19 MED ORDER — IOHEXOL 300 MG/ML  SOLN
20.0000 mL | Freq: Once | INTRAMUSCULAR | Status: AC | PRN
  Administered 2013-11-19: 25 mL

## 2013-11-19 MED ORDER — LIDOCAINE HCL 1 % IJ SOLN
INTRAMUSCULAR | Status: AC
  Filled 2013-11-19: qty 20

## 2013-11-19 NOTE — Procedures (Signed)
Procedure:  Right nephrostogram and ureteral stent placement Findings:  Distal ureteral stricture.  Able to be crossed by guidewire.  10 Fr x 22 cm ureteral stent placed and spans from renal pelvis to bladder.  Good drainage via stent into bladder.  PCN access removed. Will need elective stent exchange in 8-12 weeks.

## 2013-11-24 ENCOUNTER — Ambulatory Visit: Payer: Medicare Other | Admitting: Pharmacist

## 2013-11-24 DIAGNOSIS — I82409 Acute embolism and thrombosis of unspecified deep veins of unspecified lower extremity: Secondary | ICD-10-CM

## 2013-11-24 DIAGNOSIS — C189 Malignant neoplasm of colon, unspecified: Secondary | ICD-10-CM

## 2013-11-24 LAB — POCT INR: INR: 3.1

## 2013-11-24 NOTE — Progress Notes (Signed)
**  [facility] 3[facility]1.  No changes in meds and no unusual bruising or bleeding.  Coumadin had been held for 2 days last week for a high INR.  Will slightly decrease coumadin from what Mr Begley has been taking to 2.5mg  MWF and 5mg  other days.  Willl check PT/INR in 1 week by hospice RN.

## 2013-11-25 ENCOUNTER — Encounter: Payer: Self-pay | Admitting: Cardiology

## 2013-11-30 ENCOUNTER — Telehealth: Payer: Self-pay | Admitting: *Deleted

## 2013-11-30 NOTE — Telephone Encounter (Signed)
Received message from [facility] [place] asking, "family wants to know if patient needs to come in tomorrow for appt; he is able to get in a car."  Per Dr. Benay Spice; notified Santiago Glad-- MD said it is up to the family whether to come or not; it is Okay either way with Dr. Benay Spice; call for any problems.  Santiago Glad verbalized understanding and states she will notified family.

## 2013-12-01 ENCOUNTER — Ambulatory Visit: Payer: Medicare Other | Admitting: Nurse Practitioner

## 2013-12-01 ENCOUNTER — Ambulatory Visit: Payer: Medicare Other | Admitting: Pharmacist

## 2013-12-01 ENCOUNTER — Other Ambulatory Visit: Payer: Medicare Other

## 2013-12-01 DIAGNOSIS — C189 Malignant neoplasm of colon, unspecified: Secondary | ICD-10-CM

## 2013-12-01 DIAGNOSIS — I82409 Acute embolism and thrombosis of unspecified deep veins of unspecified lower extremity: Secondary | ICD-10-CM

## 2013-12-01 LAB — POCT INR: INR: 2.1

## 2013-12-01 NOTE — Progress Notes (Signed)
**  Telephone encounter-no charge**  Spoke to [place], Therapist, sports from hospice.  INR = 2.1 on coumadin 2.5mg  MWF and 5mg  other days.  No changes in meds.  No bleeding or bruising.  Will continue current coumadin dose and check PT/INR in 1 week with Santiago Glad of [facility].

## 2013-12-08 ENCOUNTER — Ambulatory Visit: Payer: Medicare Other | Admitting: Pharmacist

## 2013-12-08 DIAGNOSIS — C189 Malignant neoplasm of colon, unspecified: Secondary | ICD-10-CM

## 2013-12-08 DIAGNOSIS — I82409 Acute embolism and thrombosis of unspecified deep veins of unspecified lower extremity: Secondary | ICD-10-CM

## 2013-12-08 LAB — POCT INR: INR: 2.3

## 2013-12-08 NOTE — Patient Instructions (Signed)
INR at goal No changes Continue Coumadin to 2.5mg  MWF and 5mg  other days.   Recheck next week with Hospice nurse, Santiago Glad [phone]).

## 2013-12-08 NOTE — Progress Notes (Signed)
*  Telephone-Encounter No charge* INR at goal Spoke with Hospice RN Pt is doing well with no complaints No unusual bleeding or bruising No diet or medication changes No missed or extra doses Plan: No changes Continue Coumadin to 2.5mg  MWF and 5mg  other days.   Recheck next week with Hospice nurse, Santiago Glad [phone]).

## 2013-12-09 ENCOUNTER — Other Ambulatory Visit: Payer: Self-pay | Admitting: *Deleted

## 2013-12-09 ENCOUNTER — Other Ambulatory Visit: Payer: Self-pay | Admitting: Internal Medicine

## 2013-12-09 DIAGNOSIS — I82409 Acute embolism and thrombosis of unspecified deep veins of unspecified lower extremity: Secondary | ICD-10-CM

## 2013-12-09 MED ORDER — WARFARIN SODIUM 5 MG PO TABS: ORAL_TABLET | ORAL | Status: DC

## 2013-12-09 NOTE — Telephone Encounter (Signed)
Needs refill on warfarin 5 mg from Dr. Benay Spice- no longer sees Dr. Ladene Artist.

## 2013-12-15 ENCOUNTER — Ambulatory Visit (INDEPENDENT_AMBULATORY_CARE_PROVIDER_SITE_OTHER): Payer: Self-pay | Admitting: Pharmacist

## 2013-12-15 DIAGNOSIS — C189 Malignant neoplasm of colon, unspecified: Secondary | ICD-10-CM

## 2013-12-15 DIAGNOSIS — I82409 Acute embolism and thrombosis of unspecified deep veins of unspecified lower extremity: Secondary | ICD-10-CM

## 2013-12-15 LAB — POCT INR: INR: 3.6

## 2013-12-15 NOTE — Progress Notes (Signed)
INR = 3.6 (drawn by Hospice, Dot Been, RN) on Coumadin 5 mg daily except 2.5 mg MWF Pts wife, Bethena Roys reports pt is only taking Coumadin and an anti-diarrheal agent. Pt is "eating pretty good." INR elevated today but was fine the last 2 checks.  I have advised pts wife to hold Coumadin x 1 today only then resume usual dose tomorrow. Pt will be getting a new RN at [facility], Barbra Sarks [phone]) starting Thursday. I s/w nurse manager, Abigail Butts at [facility] and she is coordinating the transition.  Next INR will be next Tues. NO CHARGE - Phone Encounter. Kennith Center, Pharm.D., CPP 12/15/2013@4 :00 PM

## 2013-12-17 ENCOUNTER — Telehealth: Payer: Self-pay | Admitting: *Deleted

## 2013-12-17 NOTE — Telephone Encounter (Signed)
Message from [place]. RN with hospice reporting pt has developed a rash over his legs and back. Per Lattie Haw, Hospice MD thinks it looks like a drug rash and recommends a steroid taper. Pt has not had any medication changes.  Reviewed with Dr. Benay Spice: OK for steroid taper, Hospice MD to dose steroid. Returned call to [place] with this. Family is asking when pt will need stent replaced. Per procedure note, Dr. Kathlene Cote recommends 8-12 weeks.

## 2013-12-17 NOTE — Telephone Encounter (Signed)
Clarified with Justin Lang in IR- stent replacement should be scheduled with urology 8-12 weeks from placement.

## 2013-12-22 ENCOUNTER — Ambulatory Visit: Payer: Medicare Other | Admitting: Pharmacist

## 2013-12-22 DIAGNOSIS — C189 Malignant neoplasm of colon, unspecified: Secondary | ICD-10-CM

## 2013-12-22 DIAGNOSIS — I82409 Acute embolism and thrombosis of unspecified deep veins of unspecified lower extremity: Secondary | ICD-10-CM

## 2013-12-22 LAB — POCT INR: INR: 3.1

## 2013-12-22 NOTE — Patient Instructions (Signed)
Take coumadin 2.5mg  today. On 12/23/13, continue 5mg  daily except 2.5mg  on MWF.  Recheck INR in 1 week with next hospice RN visit on 12/29/13.

## 2013-12-22 NOTE — Progress Notes (Signed)
INR slightly above goal today. Pt took coumadin as instructed last week. No missed or extra coumadin doses. Pt is on Prednisone x 6 days for drug rash.  Last dose of Prednisone is on 12/23/13. His appetite is very good and he is eating well. No changes in the types of foods he is eating. Pt will also take an antibiotic prior to his dentist appt next week. It will be "5 pills" per wife report. (?Amoxicillin) No unusual bruising. No bleeding noted. No s/s of clotting noted. Take coumadin 2.5mg  today. On 12/23/13, continue 5mg  daily except 2.5mg  on MWF.  Recheck INR in 1 week with next hospice RN visit on 12/29/13. INR results will be called to [phone] by Hospice RN. I spoke to [place], Phone [phone]. Spoke to patient by phone - no charge coumadin clinic encounter.

## 2013-12-23 ENCOUNTER — Encounter: Payer: Self-pay | Admitting: *Deleted

## 2013-12-29 ENCOUNTER — Ambulatory Visit: Payer: Medicare Other | Admitting: Pharmacist

## 2013-12-29 DIAGNOSIS — I82409 Acute embolism and thrombosis of unspecified deep veins of unspecified lower extremity: Secondary | ICD-10-CM

## 2013-12-29 DIAGNOSIS — C189 Malignant neoplasm of colon, unspecified: Secondary | ICD-10-CM

## 2013-12-29 LAB — POCT INR: INR: 2.4

## 2013-12-29 NOTE — Progress Notes (Signed)
NO CHARGE - TELEPHONE ENCOUNTER ONLY  Hospice RN called with INR results. INR = 2.4      Goal 2-3 Patient has finished the short course of antibiotics he was prescribed by his dentist. INR is with goal range. No complications of anticoagulation noted. He will continue 5mg  daily except 2.5mg  on MWF. Will recheck INR in 1 week with next hospice RN visit on 01/05/14. INR results will be called to [phone] by Hospice RN.  Theone Murdoch, PharmD

## 2013-12-31 ENCOUNTER — Ambulatory Visit: Payer: Medicare Other | Admitting: *Deleted

## 2013-12-31 LAB — MDC_IDC_ENUM_SESS_TYPE_REMOTE
Battery Voltage: 2.97 V
Brady Statistic AP VP Percent: 0.13 %
Brady Statistic AP VS Percent: 42.03 %
Brady Statistic AS VP Percent: 0.04 %
Brady Statistic AS VS Percent: 57.8 %
Brady Statistic RA Percent Paced: 42.16 %
Brady Statistic RV Percent Paced: 0.17 %
Date Time Interrogation Session: 20151217183333
Lead Channel Impedance Value: 376 Ohm
Lead Channel Impedance Value: 512 Ohm
Lead Channel Sensing Intrinsic Amplitude: 2.0597
Lead Channel Sensing Intrinsic Amplitude: 6.6272
Lead Channel Setting Pacing Amplitude: 2 V
Lead Channel Setting Pacing Amplitude: 2.5 V
Lead Channel Setting Pacing Pulse Width: 0.4 ms
Lead Channel Setting Sensing Sensitivity: 0.9 mV
Zone Setting Detection Interval: 350 ms
Zone Setting Detection Interval: 400 ms

## 2014-01-05 ENCOUNTER — Ambulatory Visit (INDEPENDENT_AMBULATORY_CARE_PROVIDER_SITE_OTHER): Payer: Medicare Other | Admitting: Pharmacist

## 2014-01-05 DIAGNOSIS — C189 Malignant neoplasm of colon, unspecified: Secondary | ICD-10-CM

## 2014-01-05 DIAGNOSIS — I82409 Acute embolism and thrombosis of unspecified deep veins of unspecified lower extremity: Secondary | ICD-10-CM

## 2014-01-05 LAB — POCT INR: INR: 2.2

## 2014-01-05 NOTE — Progress Notes (Signed)
INR at goal *Telephone Encounter - No charge* Spoke to Hospice RN, Justin Lang, as well as Patient's wife, Justin Lang, Pt is doing well with no complaints Appetite is stable No unusual bleeding or bruising No missed or extra doses No medication or diet changes Plans: Continue 5mg  daily except 2.5mg  on MWF. Recheck INR in 1 week with next hospice RN visit on 01/12/14. INR results will be called to [phone] by Hospice RN.

## 2014-01-05 NOTE — Patient Instructions (Signed)
INR at goal No changes Continue 5mg  daily except 2.5mg  on MWF. Recheck INR in 1 week with next hospice RN visit on 01/12/14. INR results will be called to [phone] by Hospice RN.

## 2014-01-12 ENCOUNTER — Ambulatory Visit: Payer: Medicare Other | Admitting: Pharmacist

## 2014-01-12 ENCOUNTER — Encounter: Payer: Self-pay | Admitting: Cardiology

## 2014-01-12 DIAGNOSIS — I82409 Acute embolism and thrombosis of unspecified deep veins of unspecified lower extremity: Secondary | ICD-10-CM

## 2014-01-12 DIAGNOSIS — C189 Malignant neoplasm of colon, unspecified: Secondary | ICD-10-CM

## 2014-01-12 LAB — POCT INR: INR: 2.5

## 2014-01-12 NOTE — Patient Instructions (Signed)
Continue 5mg  daily except 2.5mg  on MWF.  Recheck INR in 1 week with next hospice RN visit on 01/19/14.  INR results will be called to [phone] by Hospice RN.

## 2014-01-12 NOTE — Progress Notes (Signed)
INR at goal.  INR = 2.5 Spoke to Hospice RN, Lattie Haw, as well as patient's wife, Bethena Roys. Pt is doing well with no complaints Pt has a good appetitie. No unusual bleeding or bruising. No s/s of clotting noted. No missed or extra doses. No medication or diet changes. Bethena Roys stated that the only medication that the patient is taking is coumadin. Plans: Continue 5mg  daily except 2.5mg  on MWF. Recheck INR in 1 week with next hospice RN visit on 01/19/14. INR results will be called to [phone] by Hospice RN. *Telephone Encounter - No chargeLattie Haw, RN Phone = [phone]

## 2014-01-19 ENCOUNTER — Ambulatory Visit: Admitting: Pharmacist

## 2014-01-19 ENCOUNTER — Telehealth: Payer: Self-pay | Admitting: *Deleted

## 2014-01-19 ENCOUNTER — Other Ambulatory Visit: Payer: Self-pay | Admitting: *Deleted

## 2014-01-19 DIAGNOSIS — C189 Malignant neoplasm of colon, unspecified: Secondary | ICD-10-CM

## 2014-01-19 DIAGNOSIS — I82409 Acute embolism and thrombosis of unspecified deep veins of unspecified lower extremity: Secondary | ICD-10-CM

## 2014-01-19 LAB — POCT INR: INR: 2.3

## 2014-01-19 NOTE — Telephone Encounter (Signed)
Right ureteral stent placed 11/19/13 in IR. Asking if he needs urology referral to be seen to determine when to exchange? Noted that procedure note said refer to urology in 8-12 weeks to exchange stent. Will follow up with Dr. Benay Spice.

## 2014-01-19 NOTE — Progress Notes (Signed)
Per Dr. Benay Spice : Refer to [facility]-1st available.

## 2014-01-19 NOTE — Patient Instructions (Signed)
Continue 5mg  daily except 2.5mg  on MWF. Recheck INR in 1 week with next hospice RN visit on 01/26/14.  INR results will be called to [phone] by Hospice RN.

## 2014-01-19 NOTE — Progress Notes (Signed)
Pt seen by Hospice RN, Lattie Haw, at his home NO CHARGE PHONE ENCOUNTER INR=2.3 No changes to report Will check next Tue with next Hospice [facility] visit Continue 5mg  daily except 2.5mg  on MWF.  Recheck INR in 1 week with next hospice RN visit on 01/26/14.  INR results will be called to [phone] by Hospice RN.

## 2014-01-21 ENCOUNTER — Telehealth: Payer: Self-pay | Admitting: Oncology

## 2014-01-21 NOTE — Telephone Encounter (Signed)
S/w Gwen to sch referral for [facility] per 01/05 POF, Justin Lang is going to s/w previous MD and call me back with apt.... KJ

## 2014-01-22 ENCOUNTER — Telehealth: Payer: Self-pay | Admitting: *Deleted

## 2014-01-22 NOTE — Telephone Encounter (Signed)
Spoke with pt's wife, Dr. Benay Spice discussed case with Dr. Risa Grill. Dr. Risa Grill recommends leaving stent in place for at least 4 mos unless he is having issues with it. Instructed her to call office or hospice RN for fever/ shaking chills or increased pain. She voiced understanding. Order sent to schedulers for office visit in 4-6 weeks.

## 2014-01-25 ENCOUNTER — Telehealth: Payer: Self-pay | Admitting: Oncology

## 2014-01-25 NOTE — Telephone Encounter (Signed)
, °

## 2014-01-26 ENCOUNTER — Telehealth: Payer: Self-pay | Admitting: Oncology

## 2014-01-26 ENCOUNTER — Ambulatory Visit: Admitting: Pharmacist

## 2014-01-26 DIAGNOSIS — C189 Malignant neoplasm of colon, unspecified: Secondary | ICD-10-CM

## 2014-01-26 DIAGNOSIS — I82409 Acute embolism and thrombosis of unspecified deep veins of unspecified lower extremity: Secondary | ICD-10-CM

## 2014-01-26 LAB — POCT INR: INR: 2.6

## 2014-01-26 NOTE — Telephone Encounter (Signed)
Gwen called back concerning pt's referral to [facility], she states Dr. Risa Grill contacted Dr. Benay Spice concerning pt's condition and needs and that he will not need to schedule an apt with their office and that pt is in hospice care and that they will continue with contact with Dr. Benay Spice concerning future needs if needed..... KJ

## 2014-01-26 NOTE — Progress Notes (Signed)
Pt seen by Hospice RN, Renee, at his home NO CHARGE PHONE ENCOUNTER INR=2.6 [name]'s appetite is decreasing and he is eating less, no other changes in his condition. Will check next Tue with next Hospice [facility] visit Continue 5mg  daily except 2.5mg  on MWF.  Recheck INR in 1 week with next hospice RN visit on 02/02/14.  INR results will be called to [phone] by Hospice RN.

## 2014-01-27 ENCOUNTER — Encounter: Payer: Self-pay | Admitting: Internal Medicine

## 2014-02-02 ENCOUNTER — Ambulatory Visit (INDEPENDENT_AMBULATORY_CARE_PROVIDER_SITE_OTHER): Payer: Self-pay | Admitting: Pharmacist

## 2014-02-02 DIAGNOSIS — C189 Malignant neoplasm of colon, unspecified: Secondary | ICD-10-CM

## 2014-02-02 DIAGNOSIS — I82409 Acute embolism and thrombosis of unspecified deep veins of unspecified lower extremity: Secondary | ICD-10-CM

## 2014-02-02 LAB — POCT INR: INR: 2.6

## 2014-02-02 NOTE — Progress Notes (Signed)
INR at goal. INR = 2.6 Spoke to Hospice RN, Lattie Haw, as well as patient's wife, Justin Lang. Pt is doing well with no complaints Pt has a good appetitie. No unusual bleeding or bruising. No s/s of clotting noted. No missed or extra doses. Pt started a cough syrup as needed. Plan: Continue 5mg  daily except 2.5mg  on MWF. Recheck INR in 1 week with next hospice RN visit on 02/09/14. INR results will be called to [phone] by Hospice RN. *Telephone Encounter - No chargeLattie Haw, RN Phone = [phone]

## 2014-02-02 NOTE — Patient Instructions (Signed)
Continue 5mg  daily except 2.5mg  on MWF.  Recheck INR in 1 week with next hospice RN visit on 02/09/14.

## 2014-02-09 ENCOUNTER — Ambulatory Visit: Admitting: Pharmacist

## 2014-02-09 DIAGNOSIS — I82409 Acute embolism and thrombosis of unspecified deep veins of unspecified lower extremity: Secondary | ICD-10-CM

## 2014-02-09 DIAGNOSIS — C189 Malignant neoplasm of colon, unspecified: Secondary | ICD-10-CM

## 2014-02-09 LAB — POCT INR: INR: 2.3

## 2014-02-09 NOTE — Patient Instructions (Signed)
INR at goal No changes Continue 5mg  daily except 2.5mg  on MWF.  Recheck INR in 1 week with next hospice RN visit on 02/16/14.   INR results will be called to [phone] by Hospice RN.

## 2014-02-09 NOTE — Progress Notes (Signed)
*  Telephone Encounter - No Charge* INR at goal Spoke to Hospice RN, Lattie Haw Pt is doing well with no complaints Minor episode of blood in stool last week that has resolved No other unusual bleeding or bruising No missed or extra doses No diet or medication changes Plan: Continue 5mg  daily except 2.5mg  on MWF.  Recheck INR in 1 week with next hospice RN visit on 02/16/14.   INR results will be called to [phone] by Hospice RN.

## 2014-02-15 ENCOUNTER — Telehealth: Payer: Self-pay | Admitting: *Deleted

## 2014-02-15 ENCOUNTER — Telehealth: Payer: Self-pay | Admitting: Oncology

## 2014-02-15 DIAGNOSIS — Z7901 Long term (current) use of anticoagulants: Secondary | ICD-10-CM

## 2014-02-15 NOTE — Telephone Encounter (Signed)
Lattie Haw RN with Hospice calling regarding currently obtain PT / INR weekly and call it to the Coumadin  Clinic.  " per our meeting this AM I was asked to call Dr Gearldine Shown and inquire if he wants to continue weekly lab or change to monthly ?"  Return call number [phone].  THIS NOTE WILL BE FORWARDED TO MD AND RN AT DESK FOR INQUIRY AND RETURN CALL.

## 2014-02-15 NOTE — Telephone Encounter (Signed)
Confirm add for lab 02/09

## 2014-02-15 NOTE — Telephone Encounter (Signed)
Per Dr. Benay Spice; notified Lattie Haw, RN from Hospice that MD okay with getting PT/INR monthly for pt and when pt comes to office visit 02/23/14; we will check it then as well.  Lattie Haw verbalized understanding of order given.

## 2014-02-16 ENCOUNTER — Telehealth: Payer: Self-pay | Admitting: Pharmacist

## 2014-02-16 NOTE — Telephone Encounter (Signed)
Spoke with Lattie Haw, Hospice RN at [phone]. She states that per Dr. Gearldine Shown request, they did not draw an INR today. He will draw one on 2/9 when Mr. Sculley has an office visit. He then plans to go to monthly INRs.

## 2014-02-23 ENCOUNTER — Other Ambulatory Visit (HOSPITAL_BASED_OUTPATIENT_CLINIC_OR_DEPARTMENT_OTHER)

## 2014-02-23 ENCOUNTER — Ambulatory Visit (HOSPITAL_BASED_OUTPATIENT_CLINIC_OR_DEPARTMENT_OTHER): Payer: Medicare Other | Admitting: Oncology

## 2014-02-23 ENCOUNTER — Telehealth: Payer: Self-pay | Admitting: Pharmacist

## 2014-02-23 ENCOUNTER — Ambulatory Visit (INDEPENDENT_AMBULATORY_CARE_PROVIDER_SITE_OTHER): Payer: Self-pay | Admitting: Pharmacist

## 2014-02-23 ENCOUNTER — Telehealth: Payer: Self-pay | Admitting: Oncology

## 2014-02-23 VITALS — BP 142/94 | HR 94 | Temp 97.6°F | Resp 18 | Ht 68.0 in | Wt 168.8 lb

## 2014-02-23 DIAGNOSIS — C181 Malignant neoplasm of appendix: Secondary | ICD-10-CM | POA: Diagnosis not present

## 2014-02-23 DIAGNOSIS — C180 Malignant neoplasm of cecum: Secondary | ICD-10-CM | POA: Diagnosis not present

## 2014-02-23 DIAGNOSIS — C189 Malignant neoplasm of colon, unspecified: Secondary | ICD-10-CM

## 2014-02-23 DIAGNOSIS — Z7901 Long term (current) use of anticoagulants: Secondary | ICD-10-CM

## 2014-02-23 DIAGNOSIS — I82409 Acute embolism and thrombosis of unspecified deep veins of unspecified lower extremity: Secondary | ICD-10-CM

## 2014-02-23 DIAGNOSIS — C786 Secondary malignant neoplasm of retroperitoneum and peritoneum: Secondary | ICD-10-CM | POA: Diagnosis not present

## 2014-02-23 DIAGNOSIS — I2699 Other pulmonary embolism without acute cor pulmonale: Secondary | ICD-10-CM

## 2014-02-23 LAB — PROTIME-INR
INR: 2.2 (ref 2.00–3.50)
PROTIME: 26.4 s — AB (ref 10.6–13.4)

## 2014-02-23 LAB — POCT INR: INR: 2.2

## 2014-02-23 NOTE — Telephone Encounter (Signed)
Patient saw Dr. Benay Spice today. INR = 2.2 at today's visit. Left VM for Mr. And Mrs. Tozzi to call CC for instructions. Will leave Coumadin dose the same and hospice will continue drawing weekly INR.

## 2014-02-23 NOTE — Progress Notes (Signed)
TELEPHONE ENCOUNTER ONLY - NO CHARGE  INR = 2.2  Goal 2-3 Patient seen by Dr. Benay Spice at office visit today. I spoke with Mrs. Darnell over the telephone this afternoon. She states that Hospice will start checking INR monthly rather than weekly. Mr. Serena's INR is stable. He has no complications of anticoagulation. He will continue 5mg  daily except 2.5mg  on MWF. Hospice will check his INR next month (approximately 03/23/14) and call [phone] with results.  Theone Murdoch, PharmD

## 2014-02-23 NOTE — Progress Notes (Signed)
  [place] OFFICE PROGRESS NOTE   Diagnosis: Colon cancer  INTERVAL HISTORY:   Justin Lang returns as scheduled. He has a good appetite. No pain. His activity level is limited. He underwent internalization of the right ureter stent on 11/19/2013. He continues Coumadin anticoagulation.  Objective:  Vital signs in last 24 hours:  Blood pressure 142/94, pulse 94, temperature 97.6 F (36.4 C), temperature source Oral, resp. rate 18, height _0  (1.727 m), weight 168 lb 12.8 oz (76.567 kg), SpO2 100 %.   Resp: Lungs clear bilaterally Cardio: Regular rate and rhythm GI: No hepatomegaly, nontender, no mass, no apparent ascites Vascular: No leg edema    Lab Results: PT/INR 2.2  Medications: I have reviewed the patient's current medications.  Assessment/Plan: 1.Metastatic colorectal cancer, status post resection of a cecum/appendiceal mass in November 2013 (T4 N0), K-ras wild-type  -Biopsy of a peritoneal nodule 07/04/2012 confirmed metastatic colorectal cancer  -cycle 1 panitumumab 08/20/2012  -CT abdomen/pelvis 03/16/2013 without evidence of progressive colon cancer  -CT abdomen/pelvis 10/02/2013 with an increased peritoneal masses causing right ureter obstruction  2. Sigmoid colon cancer in 1997, status post a partial course of adjuvant chemotherapy (? 5-FU/leucovorin), discontinued secondary to a cardiovascular event  3. Resection of a periaortic lymph node in 2002 with the pathology confirming a moderately differentiated carcinoma  4. History of a DVT/pulmonary embolism, status post IVC filter placement, maintained on Coumadin , followed at the [place] [facility]  5. Arrhythmia, status post pacemaker placement , followed by cardiology  6. cardiomyopathy-EF 20-25% on echocardiogram 10/02/2013  7. acute renal failure secondary to obstructive nephropathy , status post placement of a percutaneous nephrostomy tube 10/09/2013  Status post  internalization of the right ureter stent on 11/19/2013    Disposition:  Justin Lang appears stable. He appears to be slowly declining in the setting of metastatic colon cancer. He will continue follow-up with the Hospice RN. I will refer him to Justin Lang to discuss timing of the right ureter stent exchange.  He will return for an office visit here in 4 months. We will see him sooner as needed.  Justin Coder, MD  02/23/2014  12:23 PM

## 2014-02-23 NOTE — Telephone Encounter (Signed)
gv and printed appt sched and avs for pt for JUNE.Marland KitchenMarland KitchenRobyn from [facility] will call pt with appt

## 2014-02-24 ENCOUNTER — Telehealth: Payer: Self-pay | Admitting: *Deleted

## 2014-02-24 ENCOUNTER — Telehealth: Payer: Self-pay | Admitting: Oncology

## 2014-02-24 NOTE — Telephone Encounter (Signed)
Faxed pt medical records to [facility] °

## 2014-03-11 ENCOUNTER — Other Ambulatory Visit: Payer: Self-pay | Admitting: Urology

## 2014-03-11 DIAGNOSIS — N3020 Other chronic cystitis without hematuria: Secondary | ICD-10-CM | POA: Diagnosis not present

## 2014-03-12 NOTE — Patient Instructions (Addendum)
Baxter Kail.  03/12/2014   Your procedure is scheduled on: 03/17/2014    Report to [facility] Main  Entrance and follow signs to               [place] at       0730 AM.  Call this number if you have problems the morning of surgery [phone]   Remember:  Do not eat food or drink liquids :After Midnight.     Take these medicines the morning of surgery with A SIP OF WATER: cipro                               You may not have any metal on your body including hair pins and              piercings  Do not wear jewelry,  lotions, powders or perfumes., deodorant.                Men may shave face and neck.  Do not bring valuables to the hospital. [place].  Contacts, dentures or bridgework may not be worn into surgery.  Patients discharged the day of surgery will not be allowed to drive home.  Name and phone number of your driver:  Special Instructions: coughing and deep breathing exercises, leg exercises          _____________________________________________________________________             [facility] - Preparing for Surgery Before surgery, you can play an important role.  Because skin is not sterile, your skin needs to be as free of germs as possible.  You can reduce the number of germs on your skin by washing with CHG (chlorahexidine gluconate) soap before surgery.  CHG is an antiseptic cleaner which kills germs and bonds with the skin to continue killing germs even after washing. Please DO NOT use if you have an allergy to CHG or antibacterial soaps.  If your skin becomes reddened/irritated stop using the CHG and inform your nurse when you arrive at Short Stay. Do not shave (including legs and underarms) for at least 48 hours prior to the first CHG shower.  You may shave your face/neck. Please follow these instructions carefully:  1.  Shower with CHG Soap the night before surgery and the   morning of Surgery.  2.  If you choose to wash your hair, wash your hair first as usual with your  normal  shampoo.  3.  After you shampoo, rinse your hair and body thoroughly to remove the  shampoo.                          4.  Use CHG as you would any other liquid soap.  You can apply chg directly  to the skin and wash                       Gently with a scrungie or clean washcloth.  5.  Apply the CHG Soap to your body ONLY FROM THE NECK DOWN.   Do not use on face/ open  Wound or open sores. Avoid contact with eyes, ears mouth and genitals (private parts).                       Wash face,  Genitals (private parts) with your normal soap.             6.  Wash thoroughly, paying special attention to the area where your surgery  will be performed.  7.  Thoroughly rinse your body with warm water from the neck down.  8.  DO NOT shower/wash with your normal soap after using and rinsing off  the CHG Soap.                9.  Pat yourself dry with a clean towel.            10.  Wear clean pajamas.            11.  Place clean sheets on your bed the night of your first shower and do not  sleep with pets. Day of Surgery : Do not apply any lotions/deodorants the morning of surgery.  Please wear clean clothes to the hospital/surgery center.  FAILURE TO FOLLOW THESE INSTRUCTIONS MAY RESULT IN THE CANCELLATION OF YOUR SURGERY PATIENT SIGNATURE_________________________________  NURSE SIGNATURE__________________________________  ________________________________________________________________________

## 2014-03-15 ENCOUNTER — Ambulatory Visit (HOSPITAL_COMMUNITY)
Admission: RE | Admit: 2014-03-15 | Discharge: 2014-03-15 | Disposition: A | Discharge status: Home / Self Care | Source: Ambulatory Visit | Attending: Anesthesiology | Admitting: Anesthesiology

## 2014-03-15 ENCOUNTER — Encounter (HOSPITAL_COMMUNITY)
Admission: RE | Admit: 2014-03-15 | Discharge: 2014-03-15 | Disposition: A | Discharge status: Home / Self Care | Source: Ambulatory Visit | Attending: Urology | Admitting: Urology

## 2014-03-15 ENCOUNTER — Encounter (HOSPITAL_COMMUNITY): Payer: Self-pay

## 2014-03-15 DIAGNOSIS — I10 Essential (primary) hypertension: Secondary | ICD-10-CM | POA: Insufficient documentation

## 2014-03-15 DIAGNOSIS — Z01810 Encounter for preprocedural cardiovascular examination: Secondary | ICD-10-CM | POA: Diagnosis not present

## 2014-03-15 DIAGNOSIS — Z01812 Encounter for preprocedural laboratory examination: Secondary | ICD-10-CM | POA: Diagnosis not present

## 2014-03-15 HISTORY — DX: Personal history of urinary calculi: Z87.442

## 2014-03-15 HISTORY — DX: Gastro-esophageal reflux disease without esophagitis: K21.9

## 2014-03-15 HISTORY — DX: Anxiety disorder, unspecified: F41.9

## 2014-03-15 HISTORY — DX: Other amnesia: R41.3

## 2014-03-15 LAB — CBC
HCT: 43.9 % (ref 39.0–52.0)
Hemoglobin: 13.8 g/dL (ref 13.0–17.0)
MCH: 29.7 pg (ref 26.0–34.0)
MCHC: 31.4 g/dL (ref 30.0–36.0)
MCV: 94.6 fL (ref 78.0–100.0)
Platelets: 277 10*3/uL (ref 150–400)
RBC: 4.64 MIL/uL (ref 4.22–5.81)
RDW: 14.6 % (ref 11.5–15.5)
WBC: 7.8 10*3/uL (ref 4.0–10.5)

## 2014-03-15 LAB — BASIC METABOLIC PANEL
ANION GAP: 6 (ref 5–15)
BUN: 23 mg/dL (ref 6–23)
CO2: 27 mmol/L (ref 19–32)
CREATININE: 2.01 mg/dL — AB (ref 0.50–1.35)
Calcium: 8.9 mg/dL (ref 8.4–10.5)
Chloride: 107 mmol/L (ref 96–112)
GFR calc non Af Amer: 28 mL/min — ABNORMAL LOW (ref >90)
GFR, EST AFRICAN AMERICAN: 32 mL/min — AB (ref >90)
Glucose, Bld: 95 mg/dL (ref 70–99)
POTASSIUM: 4 mmol/L (ref 3.5–5.1)
Sodium: 140 mmol/L (ref 135–145)

## 2014-03-15 LAB — APTT: aPTT: 42 seconds — ABNORMAL HIGH (ref 24–37)

## 2014-03-15 LAB — PROTIME-INR
INR: 1.98 — AB (ref 0.00–1.49)
PROTHROMBIN TIME: 22.6 s — AB (ref 11.6–15.2)

## 2014-03-15 NOTE — Progress Notes (Signed)
Perioperative prescription for implanted cardiac device form on chart, EKG 10/06/13 on EPIC, ECHO 10/02/13 on EPIC

## 2014-03-15 NOTE — Progress Notes (Signed)
PT/INR, PTT and BMP results done 03/15/14 faxed via EPIC to Dr Risa Grill.

## 2014-03-17 ENCOUNTER — Ambulatory Visit (HOSPITAL_COMMUNITY): Admitting: Anesthesiology

## 2014-03-17 ENCOUNTER — Encounter (HOSPITAL_COMMUNITY)
Admission: RE | Disposition: A | Discharge status: Home / Self Care | Payer: Self-pay | Source: Ambulatory Visit | Attending: Urology

## 2014-03-17 ENCOUNTER — Encounter (HOSPITAL_COMMUNITY): Payer: Self-pay | Admitting: *Deleted

## 2014-03-17 ENCOUNTER — Ambulatory Visit (HOSPITAL_COMMUNITY)
Admission: RE | Admit: 2014-03-17 | Discharge: 2014-03-17 | Disposition: A | Discharge status: Home / Self Care | Source: Ambulatory Visit | Attending: Urology | Admitting: Urology

## 2014-03-17 DIAGNOSIS — Z86718 Personal history of other venous thrombosis and embolism: Secondary | ICD-10-CM | POA: Insufficient documentation

## 2014-03-17 DIAGNOSIS — K219 Gastro-esophageal reflux disease without esophagitis: Secondary | ICD-10-CM | POA: Diagnosis not present

## 2014-03-17 DIAGNOSIS — N1330 Unspecified hydronephrosis: Secondary | ICD-10-CM

## 2014-03-17 DIAGNOSIS — Z96659 Presence of unspecified artificial knee joint: Secondary | ICD-10-CM | POA: Diagnosis not present

## 2014-03-17 DIAGNOSIS — I509 Heart failure, unspecified: Secondary | ICD-10-CM | POA: Insufficient documentation

## 2014-03-17 DIAGNOSIS — N261 Atrophy of kidney (terminal): Secondary | ICD-10-CM | POA: Diagnosis not present

## 2014-03-17 DIAGNOSIS — Z7901 Long term (current) use of anticoagulants: Secondary | ICD-10-CM | POA: Insufficient documentation

## 2014-03-17 DIAGNOSIS — Z85038 Personal history of other malignant neoplasm of large intestine: Secondary | ICD-10-CM | POA: Diagnosis not present

## 2014-03-17 DIAGNOSIS — Z881 Allergy status to other antibiotic agents status: Secondary | ICD-10-CM | POA: Diagnosis not present

## 2014-03-17 DIAGNOSIS — I10 Essential (primary) hypertension: Secondary | ICD-10-CM | POA: Insufficient documentation

## 2014-03-17 DIAGNOSIS — N210 Calculus in bladder: Secondary | ICD-10-CM | POA: Diagnosis not present

## 2014-03-17 DIAGNOSIS — N368 Other specified disorders of urethra: Secondary | ICD-10-CM | POA: Diagnosis not present

## 2014-03-17 HISTORY — PX: HOLMIUM LASER APPLICATION: SHX5852

## 2014-03-17 HISTORY — PX: CYSTOSCOPY W/ URETERAL STENT PLACEMENT: SHX1429

## 2014-03-17 LAB — PROTIME-INR
INR: 1.8 — ABNORMAL HIGH (ref 0.00–1.49)
PROTHROMBIN TIME: 21.1 s — AB (ref 11.6–15.2)

## 2014-03-17 SURGERY — CYSTOSCOPY, FLEXIBLE, WITH STENT REPLACEMENT
Anesthesia: General | Site: Ureter | Laterality: Right

## 2014-03-17 MED ORDER — LIDOCAINE HCL (CARDIAC) 20 MG/ML IV SOLN
INTRAVENOUS | Status: DC | PRN
  Administered 2014-03-17: 50 mg via INTRAVENOUS

## 2014-03-17 MED ORDER — LACTATED RINGERS IV SOLN: INTRAVENOUS | Status: DC

## 2014-03-17 MED ORDER — CARVEDILOL 6.25 MG PO TABS
6.2500 mg | ORAL_TABLET | Freq: Two times a day (BID) | ORAL | Status: DC
  Administered 2014-03-17: 6.25 mg via ORAL
  Filled 2014-03-17 (×4): qty 1

## 2014-03-17 MED ORDER — FENTANYL CITRATE 0.05 MG/ML IJ SOLN
INTRAMUSCULAR | Status: DC
Start: 2014-03-17 — End: 2014-03-17
  Filled 2014-03-17: qty 2

## 2014-03-17 MED ORDER — ONDANSETRON HCL 4 MG/2ML IJ SOLN
INTRAMUSCULAR | Status: AC
  Filled 2014-03-17: qty 2

## 2014-03-17 MED ORDER — LIDOCAINE HCL 2 % EX GEL
CUTANEOUS | Status: DC | PRN
  Administered 2014-03-17: 1 via URETHRAL

## 2014-03-17 MED ORDER — FENTANYL CITRATE 0.05 MG/ML IJ SOLN
INTRAMUSCULAR | Status: AC
  Filled 2014-03-17: qty 2

## 2014-03-17 MED ORDER — PROPOFOL 10 MG/ML IV BOLUS
INTRAVENOUS | Status: DC | PRN
  Administered 2014-03-17: 80 mg via INTRAVENOUS

## 2014-03-17 MED ORDER — PHENYLEPHRINE HCL 10 MG/ML IJ SOLN
INTRAMUSCULAR | Status: DC | PRN
  Administered 2014-03-17 (×2): 80 ug via INTRAVENOUS

## 2014-03-17 MED ORDER — CIPROFLOXACIN IN D5W 400 MG/200ML IV SOLN
400.0000 mg | INTRAVENOUS | Status: AC
  Administered 2014-03-17: 400 mg via INTRAVENOUS

## 2014-03-17 MED ORDER — LIDOCAINE HCL (CARDIAC) 20 MG/ML IV SOLN
INTRAVENOUS | Status: AC
  Filled 2014-03-17: qty 5

## 2014-03-17 MED ORDER — FENTANYL CITRATE 0.05 MG/ML IJ SOLN
25.0000 ug | INTRAMUSCULAR | Status: DC | PRN
  Administered 2014-03-17: 25 ug via INTRAVENOUS

## 2014-03-17 MED ORDER — LACTATED RINGERS IV SOLN
INTRAVENOUS | Status: DC | PRN
  Administered 2014-03-17: 09:00:00 via INTRAVENOUS

## 2014-03-17 MED ORDER — SODIUM CHLORIDE 0.9 % IR SOLN
Status: DC | PRN
  Administered 2014-03-17 (×2): 3000 mL

## 2014-03-17 MED ORDER — CIPROFLOXACIN IN D5W 400 MG/200ML IV SOLN
INTRAVENOUS | Status: AC
  Filled 2014-03-17: qty 200

## 2014-03-17 MED ORDER — PROPOFOL 10 MG/ML IV BOLUS
INTRAVENOUS | Status: AC
  Filled 2014-03-17: qty 20

## 2014-03-17 MED ORDER — PROMETHAZINE HCL 25 MG/ML IJ SOLN: 6.2500 mg | INTRAMUSCULAR | Status: DC | PRN

## 2014-03-17 MED ORDER — ONDANSETRON HCL 4 MG/2ML IJ SOLN
INTRAMUSCULAR | Status: DC | PRN
  Administered 2014-03-17: 4 mg via INTRAVENOUS

## 2014-03-17 MED ORDER — LIDOCAINE HCL 2 % EX GEL
CUTANEOUS | Status: AC
  Filled 2014-03-17: qty 10

## 2014-03-17 MED ORDER — PHENYLEPHRINE 40 MCG/ML (10ML) SYRINGE FOR IV PUSH (FOR BLOOD PRESSURE SUPPORT)
PREFILLED_SYRINGE | INTRAVENOUS | Status: AC
  Filled 2014-03-17: qty 10

## 2014-03-17 MED ORDER — FENTANYL CITRATE 0.05 MG/ML IJ SOLN
INTRAMUSCULAR | Status: DC | PRN
  Administered 2014-03-17 (×4): 25 ug via INTRAVENOUS

## 2014-03-17 SURGICAL SUPPLY — 15 items
CATH INTERMIT  6FR 70CM (CATHETERS) ×3 IMPLANT
FIBER LASER FLEXIVA 1000 (UROLOGICAL SUPPLIES) IMPLANT
FIBER LASER FLEXIVA 200 (UROLOGICAL SUPPLIES) IMPLANT
FIBER LASER FLEXIVA 365 (UROLOGICAL SUPPLIES) IMPLANT
FIBER LASER FLEXIVA 550 (UROLOGICAL SUPPLIES) ×3 IMPLANT
FIBER LASER TRAC TIP (UROLOGICAL SUPPLIES) IMPLANT
GLOVE BIOGEL M STRL SZ7.5 (GLOVE) ×3 IMPLANT
GLOVE BIOGEL PI IND STRL 7.0 (GLOVE) ×1 IMPLANT
GLOVE BIOGEL PI INDICATOR 7.0 (GLOVE) ×2
GOWN SPEC L3 MED W/TWL (GOWN DISPOSABLE) ×6 IMPLANT
GUIDEWIRE STR DUAL SENSOR (WIRE) ×3 IMPLANT
PACK CYSTO (CUSTOM PROCEDURE TRAY) ×3 IMPLANT
SHIELD EYE BINOCULAR (MISCELLANEOUS) IMPLANT
STENT CONTOUR 7FRX24 (STENTS) ×3 IMPLANT
SYRINGE IRR TOOMEY STRL 70CC (SYRINGE) ×3 IMPLANT

## 2014-03-17 NOTE — Op Note (Signed)
Preoperative diagnosis: Metastatic colorectal cancer with chronic right hydronephrosis, large bladder calculus Postoperative diagnosis: Same  Procedure: Cystoscopy, holmium laser lithotripsy of 4 cm bladder stone with exchange of right JJ stent   Surgeon: Bernestine Amass M.D.  Anesthesia: Gen.  Indications: [name] is 81 years of age and has had a long-standing 20 year battle with metastatic colorectal cancer. He has chronic right hydronephrosis and had a nephrostomy tube placed approximate 4 months ago with subsequent double-J stent insertion. He is in hospice but has continued to function fairly well. We felt it was appropriate this time to exchange his stent. He is known to have a large bladder stone that we thought to be treated simultaneously.     Technique and findings: Patient was brought the operating room where he had successful induction of general anesthesia. Appropriate surgical timeout was performed. He was placed in lithotomy position and prepped and draped in usual manner. Cystoscopy revealed moderate trilobar hyperplasia. Within the bladder there was a 4 cm stone. Right double-J stent was in good position. Holmium laser lithotripsy was performed on the stone utilizing 0.8 J 8 Hz. Enumerable pieces were irrigated from the bladder. The right double-J stent was partially extracted. A guidewire was placed through the stent to the right renal pelvis. Over the guidewire we placed a new 7 French 24 cm double-J stent. The patient tolerated the procedure well was brought to recovery room in stable condition.

## 2014-03-17 NOTE — Anesthesia Preprocedure Evaluation (Signed)
Anesthesia Evaluation  Patient identified by MRN, date of birth, ID band Patient awake    Reviewed: Allergy & Precautions, NPO status , Patient's Chart, lab work & pertinent test results  Airway Mallampati: II  TM Distance: >3 FB Neck ROM: Full    Dental no notable dental hx.    Pulmonary neg pulmonary ROS,  breath sounds clear to auscultation  Pulmonary exam normal       Cardiovascular DVT + pacemaker Rhythm:Regular Rate:Normal     Neuro/Psych negative neurological ROS  negative psych ROS   GI/Hepatic Neg liver ROS, Colon cancer  hx of metastic- treated with surgery and readiation     Endo/Other  negative endocrine ROS  Renal/GU negative Renal ROS  negative genitourinary   Musculoskeletal negative musculoskeletal ROS (+)   Abdominal   Peds negative pediatric ROS (+)  Hematology negative hematology ROS (+)   Anesthesia Other Findings   Reproductive/Obstetrics negative OB ROS                             Anesthesia Physical Anesthesia Plan  ASA: IV  Anesthesia Plan: General   Post-op Pain Management:    Induction: Intravenous  Airway Management Planned: LMA  Additional Equipment:   Intra-op Plan:   Post-operative Plan: Extubation in OR  Informed Consent: I have reviewed the patients History and Physical, chart, labs and discussed the procedure including the risks, benefits and alternatives for the proposed anesthesia with the patient or authorized representative who has indicated his/her understanding and acceptance.   Dental advisory given  Plan Discussed with: CRNA and Surgeon  Anesthesia Plan Comments:         Anesthesia Quick Evaluation

## 2014-03-17 NOTE — Transfer of Care (Signed)
Immediate Anesthesia Transfer of Care Note  Patient: Justin Lang.  Procedure(s) Performed: Procedure(s): CYSTOSCOPY WITH STENT REPLACEMENT (Right) HOLMIUM LASER APPLICATION OF BLADDER STONE (N/A)  Patient Location: PACU  Anesthesia Type:General  Level of Consciousness: awake, alert  and oriented  Airway & Oxygen Therapy: Patient Spontanous Breathing and Patient connected to face mask oxygen  Post-op Assessment: Report given to RN and Post -op Vital signs reviewed and stable  Post vital signs: Reviewed and stable  Last Vitals:  Filed Vitals:   03/17/14 0727  BP: 168/83  Pulse: 62  Temp: 36.3 C  Resp: 16    Complications: No apparent anesthesia complications

## 2014-03-17 NOTE — Anesthesia Postprocedure Evaluation (Signed)
  Anesthesia Post-op Note  Patient: Justin Lang.  Procedure(s) Performed: Procedure(s) (LRB): CYSTOSCOPY WITH STENT REPLACEMENT (Right) HOLMIUM LASER APPLICATION OF BLADDER STONE (N/A)  Patient Location: PACU  Anesthesia Type: General  Level of Consciousness: awake and alert   Airway and Oxygen Therapy: Patient Spontanous Breathing  Post-op Pain: mild  Post-op Assessment: Post-op Vital signs reviewed, Patient's Cardiovascular Status Stable, Respiratory Function Stable, Patent Airway and No signs of Nausea or vomiting  Last Vitals:  Filed Vitals:   03/17/14 1115  BP: 165/74  Pulse: 61  Temp:   Resp: 13    Post-op Vital Signs: stable   Complications: No apparent anesthesia complications

## 2014-03-17 NOTE — Discharge Instructions (Signed)
[facility] [phone] Post Ureteroscopy With or Without Stent Instructions  Definitions:  Ureter: The duct that transports urine from the kidney to the bladder. Stent:   A plastic hollow tube that is placed into the ureter, from the kidney to the                 bladder to prevent the ureter from swelling shut.  GENERAL INSTRUCTIONS:  Despite the fact that no skin incisions were used, the area around the ureter and bladder is raw and irritated. The stent is a foreign body which will further irritate the bladder wall. This irritation is manifested by increased frequency of urination, both day and night, and by an increase in the urge to urinate. In some, the urge to urinate is present almost always. Sometimes the urge is strong enough that you may not be able to stop yourself from urinating. The only real cure is to remove the stent and then give time for the bladder wall to heal which can't be done until the danger of the ureter swelling shut has passed, which varies.  You may see some blood in your urine while the stent is in place and a few days afterwards. Do not be alarmed, even if the urine was clear for a while. Get off your feet and drink lots of fluids until clearing occurs. If you start to pass clots or don't improve, call us.  DIET: You may return to your normal diet immediately. Because of the raw surface of your bladder, alcohol, spicy foods, acid type foods and drinks with caffeine may cause irritation or frequency and should be used in moderation. To keep your urine flowing freely and to avoid constipation, drink plenty of fluids during the day ( 8-10 glasses ). Tip: Avoid cranberry juice because it is very acidic.  ACTIVITY: Your physical activity doesn't need to be restricted. However, if you are very active, you may see some blood in your urine. We suggest that you reduce your activity under these circumstances until the bleeding has stopped.  BOWELS: It is  important to keep your bowels regular during the postoperative period. Straining with bowel movements can cause bleeding. A bowel movement every other day is reasonable. Use a mild laxative if needed, such as Milk of Magnesia 2-3 tablespoons, or 2 Dulcolax tablets. Call if you continue to have problems. If you have been taking narcotics for pain, before, during or after your surgery, you may be constipated. Take a laxative if necessary.   MEDICATION: You should resume your pre-surgery medications unless told not to. In addition you will often be given an antibiotic to prevent infection. These should be taken as prescribed until the bottles are finished unless you are having an unusual reaction to one of the drugs.  PROBLEMS YOU SHOULD REPORT TO [place]:  Fevers over 100.5 Fahrenheit.  Heavy bleeding, or clots ( See above notes about blood in urine ).  Inability to urinate.  Drug reactions ( hives, rash, nausea, vomiting, diarrhea ).  Severe burning or pain with urination that is not improving.  FOLLOW-UP: You will need a follow-up appointment to monitor your progress. Call for this appointment at the number listed above. Usually the first appointment will be about three to fourteen days after your surgery.

## 2014-03-17 NOTE — Interval H&P Note (Signed)
History and Physical Interval Note:  03/17/2014 9:27 AM  Justin Lang.  has presented today for surgery, with the diagnosis of RIGHT URETERAL OBSTRUCTION / HYDRONEPHROSIS, BLADDER STONE  The various methods of treatment have been discussed with the patient and family. After consideration of risks, benefits and other options for treatment, the patient has consented to  Procedure(s): CYSTOSCOPY WITH STENT REPLACEMENT (Right) HOLMIUM LASER APPLICATION OF BLADDER STONE (N/A) URETEROSCOPY (Right) as a surgical intervention .  The patient's history has been reviewed, patient examined, no change in status, stable for surgery.  I have reviewed the patient's chart and labs.  Questions were answered to the patient's satisfaction.     Justin Lang S

## 2014-03-17 NOTE — H&P (Signed)
History of Present Illness   Justin Lang presents today as a followup assessment for his right hydronephrosis along with numerous other issues. We saw him initially as a consultation in September of 2015. He had had an ongoing and long battler with metastatic colon cancer. He had presented with a number of complaints. He was assessed for some hydronephrosis and chronic renal failure. He has bilateral renal atrophy, left much greater than right. He has a history of bilateral nephrolithiasis. He is noted to have an elevated creatinine and significant hydronephrosis. A decision was made to decompress the right renal system and a nephrostomy tube was initially placed in September of 2015. At that time, a CT scan had shown a nonobstructing 6 mm stone and a dilated right ureter proximal to what was felt to be an obstructing extrinsic mass. There was severe left-sided renal atrophy with several stones in the left renal pelvis. The bladder was mildly distended and a prominent bladder calculus was also noted. In November of 2015, the patient underwent internalization of his nephrostomy tube to a right ureteral stent. At that time, on nephrostogram, there was significant stenosis of the right ureter, just above the level of the upper sacrum. They were however able to get the stent in without much difficulty and a large 8-French stent was placed. He has tolerated things beautifully. He is currently in hospice, but continues to do well. He is without any significant pain. He denies any hematuria. He has been voiding reasonably and otherwise, again, has no significant neurologic issues or concerns. The patient did see Dr Benay Spice fairly recently. He continues to have full anticoagulation on Coumadin. His situation is otherwise unchanged and he is not getting any active treatment for his colon cancer, other than supportive care.      Past Medical History Problems  1. History of arthritis (Z87.39) 2. History of deep venous  thrombosis (Z86.718) 3. History of heart failure (Z86.79) 4. History of hypertension (Z86.79) 5. History of malignant neoplasm of colon (Z85.038) 6. History of pulmonary embolism (Z86.711) 7. History of tuberculosis (Z86.11)  Surgical History Problems  1. History of Ankle Surgery 2. History of Back Surgery 3. History of Colon Surgery 4. History of Gastric Surgery 5. History of Knee Replacement 6. History of Pacemaker Placement  Current Meds 1. Anti-Diarrheal TABS;  Therapy: (Recorded:25Feb2016) to Recorded 2. Carvedilol 6.25 MG Oral Tablet;  Therapy: (Recorded:25Feb2016) to Recorded 3. Warfarin Sodium 5 MG Oral Tablet;  Therapy: (Recorded:25Feb2016) to Recorded  Allergies Medication  1. celecoxib 2. Vectibix SOLN  Family History Problems  1. Family history of Alzheimer's disease (Z82.0) : Father, Sister 2. Family history of malignant neoplasm of breast (Z80.3) : Sister 3. Family history of myocardial infarction (Z82.49) : Mother 4. Family history of tuberculosis (Z69.1) : Mother  Social History Problems  1. Denied: History of Alcohol use 2. Caffeine use (F15.90)   4 qd 3. Father deceased   26yrs 4. Married 5. Mother deceased   28yrs 29. Retired 89. Six children  Review of Systems Genitourinary, constitutional, skin, eye, otolaryngeal, hematologic/lymphatic, cardiovascular, pulmonary, endocrine, musculoskeletal, gastrointestinal, neurological and psychiatric system(s) were reviewed and pertinent findings if present are noted and are otherwise negative.  Genitourinary: urinary frequency, feelings of urinary urgency, nocturia and incontinence, but no dysuria and no hematuria.  Gastrointestinal: diarrhea.  Constitutional: feeling tired (fatigue).  Integumentary: skin rash/lesion and pruritus.  Hematologic/Lymphatic: a tendency to easily bruise.  Cardiovascular: leg swelling.  Musculoskeletal: back pain and joint pain.  Neurological: dizziness.  Vitals Vital  Signs [Data Includes: Last 1 Day]  Recorded: 25Feb2016 11:17AM  Height: 5 ft 7 in Weight: 166 lb  BMI Calculated: 26 BSA Calculated: 1.87 Blood Pressure: 136 / 77 Temperature: 97.6 F Heart Rate: 60  Physical Exam Constitutional: Well nourished and well developed . No acute distress.  Pulmonary: No respiratory distress and normal respiratory rhythm and effort.  Cardiovascular: Heart rate and rhythm are normal . No peripheral edema.  Genitourinary: Examination of the penis demonstrates no discharge, no masses, no lesions and a normal meatus. The scrotum is without lesions. The right epididymis is palpably normal and non-tender. The left epididymis is palpably normal and non-tender. The right testis is non-tender and without masses. The left testis is non-tender and without masses.    Results/Data Urine [Data Includes: Last 1 Day]   [id number]  COLOR YELLOW   APPEARANCE CLOUDY   SPECIFIC GRAVITY 1.015   pH 6.5   GLUCOSE NEG mg/dL  BILIRUBIN NEG   KETONE NEG mg/dL  BLOOD LARGE   PROTEIN TRACE mg/dL  UROBILINOGEN 0.2 mg/dL  NITRITE NEG   LEUKOCYTE ESTERASE MOD   SQUAMOUS EPITHELIAL/HPF RARE   WBC TNTC WBC/hpf  RBC 21-50 RBC/hpf  BACTERIA MANY   CRYSTALS NONE SEEN   CASTS NONE SEEN    Assessment Assessed  1. Hydronephrosis (N13.30) 2. Atrophic kidney, acquired (N26.1) 3. Chronic cystitis (N30.20) 4. Bladder calculus (N21.0) 5. Ureteral calculus (N20.1)  Plan Chronic cystitis  1. URINE CULTURE; Status:Hold For - Specimen/Data Collection,Appointment; Requested  for:25Feb2016;  Health Maintenance  2. UA With REFLEX; [Do Not Release]; Status:Complete;   Done: [id number] 10:43AM Ureteral calculus  3. Follow-up Schedule Surgery Office  Follow-up  Status: Hold For - Appointment   Requested for: 25Feb2016  Discussion/Summary   Justin Mierzwa has had almost a 20-year battle with colon cancer. He has had multiple operations and is currently not receiving any aggressive care. He is  in hospice, but of course, his cancer has been fairly indolent. His quality of life is fairly good at this point. Stent has now been indwelling for 3-4 months and it is appropriate to discuss exchange of the stent. Given his reasonable status at this time, I do feel it would be perfectly appropriate to replace his double-J stent. He has a large bladder stone and we ought to consider trying to resolve that issue for him. I do not think the ureteral stone really needs to be dealt with, since he is going to have a stent in anyway and taking care of that stone is not really going to change anything for him from a quality of life standpoint. We will plan on outpatient surgery at [facility] sometime in the next couple of weeks. We will need to culture his urine today and get him on appropriate antibiotics prior to the procedure, as indicated.   cc: Kendal Hymen, MD    Signatures Electronically signed by : Rana Snare, M.D.; Mar 12 2014 12:54PM EST

## 2014-03-19 ENCOUNTER — Encounter (HOSPITAL_COMMUNITY): Payer: Self-pay | Admitting: Urology

## 2014-03-25 ENCOUNTER — Telehealth: Payer: Self-pay | Admitting: *Deleted

## 2014-03-25 ENCOUNTER — Ambulatory Visit (INDEPENDENT_AMBULATORY_CARE_PROVIDER_SITE_OTHER): Admitting: Pharmacist

## 2014-03-25 DIAGNOSIS — I82409 Acute embolism and thrombosis of unspecified deep veins of unspecified lower extremity: Secondary | ICD-10-CM

## 2014-03-25 DIAGNOSIS — C189 Malignant neoplasm of colon, unspecified: Secondary | ICD-10-CM

## 2014-03-25 LAB — POCT INR: INR: 2.1

## 2014-03-25 NOTE — Telephone Encounter (Signed)
Hospice RN left voice mail stating that patient had been bitten by a tick 2 days ago. Was removed without problems. Slight red area at site of bite 1cm X 1cm. Vitals normal. Just wanted to make Dr Benay Spice aware.

## 2014-03-25 NOTE — Progress Notes (Signed)
TELEPHONE ENCOUNTER ONLY - NO CHARGE  INR = 2.1    Goal 2-3 INR called in by Hospice RN. No complications of anticoagulation noted. Continue 5mg  daily except 2.5mg  on MWF. Recheck INR in 1 month with hospice RN visit on 04/22/14. INR results will be called to [phone] by Hospice RN.  Theone Murdoch, PharmD

## 2014-04-05 ENCOUNTER — Telehealth: Payer: Self-pay | Admitting: Cardiology

## 2014-04-05 ENCOUNTER — Ambulatory Visit (INDEPENDENT_AMBULATORY_CARE_PROVIDER_SITE_OTHER): Admitting: *Deleted

## 2014-04-05 DIAGNOSIS — I495 Sick sinus syndrome: Secondary | ICD-10-CM | POA: Diagnosis not present

## 2014-04-05 NOTE — Telephone Encounter (Signed)
Confirmed remote transmission w/ pt wife.   

## 2014-04-05 NOTE — Progress Notes (Signed)
Remote pacemaker transmission.   

## 2014-04-06 LAB — MDC_IDC_ENUM_SESS_TYPE_REMOTE
Brady Statistic AS VP Percent: 0.06 %
Brady Statistic AS VS Percent: 47.5 %
Brady Statistic RA Percent Paced: 52.44 %
Brady Statistic RV Percent Paced: 0.38 %
Date Time Interrogation Session: 20160321162857
Lead Channel Impedance Value: 392 Ohm
Lead Channel Impedance Value: 560 Ohm
Lead Channel Sensing Intrinsic Amplitude: 5.5808
Lead Channel Setting Pacing Amplitude: 2 V
Lead Channel Setting Pacing Amplitude: 2.5 V
Lead Channel Setting Pacing Pulse Width: 0.4 ms
Lead Channel Setting Sensing Sensitivity: 0.9 mV
MDC IDC MSMT BATTERY VOLTAGE: 2.97 V
MDC IDC MSMT LEADCHNL RA SENSING INTR AMPL: 1.4024
MDC IDC SET ZONE DETECTION INTERVAL: 350 ms
MDC IDC STAT BRADY AP VP PERCENT: 0.32 %
MDC IDC STAT BRADY AP VS PERCENT: 52.12 %
Zone Setting Detection Interval: 400 ms

## 2014-04-19 ENCOUNTER — Other Ambulatory Visit: Payer: Self-pay | Admitting: *Deleted

## 2014-04-19 DIAGNOSIS — I82409 Acute embolism and thrombosis of unspecified deep veins of unspecified lower extremity: Secondary | ICD-10-CM

## 2014-04-19 MED ORDER — WARFARIN SODIUM 5 MG PO TABS
ORAL_TABLET | ORAL | Status: DC
Start: 2014-04-19 — End: 2014-05-20

## 2014-04-21 ENCOUNTER — Encounter: Payer: Self-pay | Admitting: Cardiology

## 2014-04-23 ENCOUNTER — Ambulatory Visit (INDEPENDENT_AMBULATORY_CARE_PROVIDER_SITE_OTHER): Payer: Self-pay | Admitting: Pharmacist

## 2014-04-23 DIAGNOSIS — I82409 Acute embolism and thrombosis of unspecified deep veins of unspecified lower extremity: Secondary | ICD-10-CM

## 2014-04-23 DIAGNOSIS — C189 Malignant neoplasm of colon, unspecified: Secondary | ICD-10-CM

## 2014-04-23 LAB — POCT INR: INR: 1.9

## 2014-04-23 NOTE — Progress Notes (Signed)
INR = 1.9; drawn externally by Hospice RN, Lexa (covering for his new Hospice RN, Windy Canny) RN reports pt having no s/sxs of VTE. INR a little low today but he's been therapeutic on the current dose for a while.  No change to Coumadin dose today. Repeat INR in 1 month w/ Hospice - 05/21/14.  Lexa aware & will schedule. We can call main Hospice ph# [phone] if necessary for results or to check RN visit dates. Kennith Center, Pharm.D., CPP 04/23/2014@1 :54 PM

## 2014-04-27 ENCOUNTER — Encounter: Payer: Self-pay | Admitting: Internal Medicine

## 2014-05-12 ENCOUNTER — Telehealth: Payer: Self-pay | Admitting: *Deleted

## 2014-05-12 NOTE — Telephone Encounter (Signed)
TC from '[place]', she states she is pt's 'nurse". She states pt is declining as evidenced by decreased appetite, sleeping more and more >20 hours a day, increased weakness. Pt is a Hospice and Palliative Care patient. Rollene Fare just wanted to make sure he did not need to see Dr. Benay Spice at this time.  It would be very difficult for patient come to the doctor's office. As patient is with [facility], there would be no need for pt to come here. Symptoms are managed according to [facility]'.. She will relay information to pt's wife. Call made to [facility] , Efrain Sella, RN, to confirm recent and ongoing hospice nurse visits to pt's home.  Awaiting call back from [facility]

## 2014-05-12 NOTE — Telephone Encounter (Signed)
Levina RN called reporting she has talked with Mrs. Raether explaining that there is no need to schedule F/U due to status declining.  Asked but she says"There is nothing specific she wants Dr. Benay Spice to do for him at this time."  Asked Levina if Hospice has symptom management order or if Hospice needs any orders from Dr.Sherrill.  Per Levina, Hospice doesn't need anything at this time.

## 2014-05-17 ENCOUNTER — Encounter (HOSPITAL_COMMUNITY): Payer: Self-pay | Admitting: Emergency Medicine

## 2014-05-17 ENCOUNTER — Inpatient Hospital Stay (HOSPITAL_COMMUNITY)
Admission: EM | Admit: 2014-05-17 | Discharge: 2014-05-20 | DRG: 695 | Disposition: A | Discharge status: Home / Self Care | Attending: Internal Medicine | Admitting: Internal Medicine

## 2014-05-17 ENCOUNTER — Inpatient Hospital Stay (HOSPITAL_COMMUNITY)

## 2014-05-17 DIAGNOSIS — D62 Acute posthemorrhagic anemia: Secondary | ICD-10-CM | POA: Diagnosis not present

## 2014-05-17 DIAGNOSIS — E43 Unspecified severe protein-calorie malnutrition: Secondary | ICD-10-CM | POA: Diagnosis present

## 2014-05-17 DIAGNOSIS — R413 Other amnesia: Secondary | ICD-10-CM | POA: Diagnosis present

## 2014-05-17 DIAGNOSIS — E872 Acidosis, unspecified: Secondary | ICD-10-CM | POA: Diagnosis present

## 2014-05-17 DIAGNOSIS — N390 Urinary tract infection, site not specified: Secondary | ICD-10-CM | POA: Diagnosis present

## 2014-05-17 DIAGNOSIS — I429 Cardiomyopathy, unspecified: Secondary | ICD-10-CM | POA: Diagnosis present

## 2014-05-17 DIAGNOSIS — N320 Bladder-neck obstruction: Secondary | ICD-10-CM | POA: Diagnosis present

## 2014-05-17 DIAGNOSIS — D6832 Hemorrhagic disorder due to extrinsic circulating anticoagulants: Secondary | ICD-10-CM | POA: Insufficient documentation

## 2014-05-17 DIAGNOSIS — Z66 Do not resuscitate: Secondary | ICD-10-CM | POA: Diagnosis present

## 2014-05-17 DIAGNOSIS — E538 Deficiency of other specified B group vitamins: Secondary | ICD-10-CM | POA: Diagnosis present

## 2014-05-17 DIAGNOSIS — N3281 Overactive bladder: Secondary | ICD-10-CM | POA: Diagnosis present

## 2014-05-17 DIAGNOSIS — Z87442 Personal history of urinary calculi: Secondary | ICD-10-CM | POA: Diagnosis not present

## 2014-05-17 DIAGNOSIS — N400 Enlarged prostate without lower urinary tract symptoms: Secondary | ICD-10-CM | POA: Diagnosis present

## 2014-05-17 DIAGNOSIS — Z86718 Personal history of other venous thrombosis and embolism: Secondary | ICD-10-CM | POA: Diagnosis not present

## 2014-05-17 DIAGNOSIS — Z888 Allergy status to other drugs, medicaments and biological substances status: Secondary | ICD-10-CM | POA: Diagnosis not present

## 2014-05-17 DIAGNOSIS — M1990 Unspecified osteoarthritis, unspecified site: Secondary | ICD-10-CM | POA: Diagnosis present

## 2014-05-17 DIAGNOSIS — R809 Proteinuria, unspecified: Secondary | ICD-10-CM | POA: Diagnosis present

## 2014-05-17 DIAGNOSIS — Z79899 Other long term (current) drug therapy: Secondary | ICD-10-CM | POA: Diagnosis not present

## 2014-05-17 DIAGNOSIS — N179 Acute kidney failure, unspecified: Secondary | ICD-10-CM | POA: Diagnosis not present

## 2014-05-17 DIAGNOSIS — G629 Polyneuropathy, unspecified: Secondary | ICD-10-CM | POA: Diagnosis present

## 2014-05-17 DIAGNOSIS — I5040 Unspecified combined systolic (congestive) and diastolic (congestive) heart failure: Secondary | ICD-10-CM | POA: Diagnosis present

## 2014-05-17 DIAGNOSIS — I495 Sick sinus syndrome: Secondary | ICD-10-CM | POA: Diagnosis present

## 2014-05-17 DIAGNOSIS — C787 Secondary malignant neoplasm of liver and intrahepatic bile duct: Secondary | ICD-10-CM | POA: Diagnosis present

## 2014-05-17 DIAGNOSIS — Z86711 Personal history of pulmonary embolism: Secondary | ICD-10-CM | POA: Diagnosis not present

## 2014-05-17 DIAGNOSIS — D500 Iron deficiency anemia secondary to blood loss (chronic): Secondary | ICD-10-CM | POA: Diagnosis not present

## 2014-05-17 DIAGNOSIS — D699 Hemorrhagic condition, unspecified: Secondary | ICD-10-CM

## 2014-05-17 DIAGNOSIS — N200 Calculus of kidney: Secondary | ICD-10-CM

## 2014-05-17 DIAGNOSIS — Z85038 Personal history of other malignant neoplasm of large intestine: Secondary | ICD-10-CM | POA: Diagnosis present

## 2014-05-17 DIAGNOSIS — R791 Abnormal coagulation profile: Secondary | ICD-10-CM | POA: Diagnosis present

## 2014-05-17 DIAGNOSIS — Z809 Family history of malignant neoplasm, unspecified: Secondary | ICD-10-CM

## 2014-05-17 DIAGNOSIS — E876 Hypokalemia: Secondary | ICD-10-CM | POA: Diagnosis present

## 2014-05-17 DIAGNOSIS — Z79891 Long term (current) use of opiate analgesic: Secondary | ICD-10-CM | POA: Diagnosis not present

## 2014-05-17 DIAGNOSIS — N184 Chronic kidney disease, stage 4 (severe): Secondary | ICD-10-CM | POA: Diagnosis present

## 2014-05-17 DIAGNOSIS — N132 Hydronephrosis with renal and ureteral calculous obstruction: Secondary | ICD-10-CM | POA: Diagnosis present

## 2014-05-17 DIAGNOSIS — E860 Dehydration: Secondary | ICD-10-CM | POA: Diagnosis present

## 2014-05-17 DIAGNOSIS — C189 Malignant neoplasm of colon, unspecified: Secondary | ICD-10-CM | POA: Insufficient documentation

## 2014-05-17 DIAGNOSIS — N189 Chronic kidney disease, unspecified: Secondary | ICD-10-CM

## 2014-05-17 DIAGNOSIS — R310 Gross hematuria: Secondary | ICD-10-CM | POA: Diagnosis present

## 2014-05-17 DIAGNOSIS — Z7901 Long term (current) use of anticoagulants: Secondary | ICD-10-CM | POA: Diagnosis not present

## 2014-05-17 DIAGNOSIS — T45515A Adverse effect of anticoagulants, initial encounter: Secondary | ICD-10-CM | POA: Diagnosis present

## 2014-05-17 DIAGNOSIS — Z950 Presence of cardiac pacemaker: Secondary | ICD-10-CM | POA: Diagnosis not present

## 2014-05-17 DIAGNOSIS — C799 Secondary malignant neoplasm of unspecified site: Secondary | ICD-10-CM | POA: Diagnosis present

## 2014-05-17 DIAGNOSIS — Z9049 Acquired absence of other specified parts of digestive tract: Secondary | ICD-10-CM | POA: Diagnosis present

## 2014-05-17 DIAGNOSIS — C19 Malignant neoplasm of rectosigmoid junction: Secondary | ICD-10-CM | POA: Diagnosis present

## 2014-05-17 DIAGNOSIS — I82409 Acute embolism and thrombosis of unspecified deep veins of unspecified lower extremity: Secondary | ICD-10-CM | POA: Insufficient documentation

## 2014-05-17 DIAGNOSIS — R030 Elevated blood-pressure reading, without diagnosis of hypertension: Secondary | ICD-10-CM | POA: Diagnosis not present

## 2014-05-17 DIAGNOSIS — R630 Anorexia: Secondary | ICD-10-CM | POA: Diagnosis not present

## 2014-05-17 DIAGNOSIS — R319 Hematuria, unspecified: Secondary | ICD-10-CM | POA: Diagnosis not present

## 2014-05-17 LAB — URINALYSIS, ROUTINE W REFLEX MICROSCOPIC
Glucose, UA: NEGATIVE mg/dL
Ketones, ur: 40 mg/dL — AB
Nitrite: POSITIVE — AB
Protein, ur: >300 mg/dL — AB
SPECIFIC GRAVITY, URINE: 1.023 (ref 1.005–1.030)
Urobilinogen, UA: 1 mg/dL (ref 0.0–1.0)
pH: 6 (ref 5.0–8.0)

## 2014-05-17 LAB — CBC
HCT: 32 % — ABNORMAL LOW (ref 39.0–52.0)
Hemoglobin: 10.3 g/dL — ABNORMAL LOW (ref 13.0–17.0)
MCH: 28.6 pg (ref 26.0–34.0)
MCHC: 32.2 g/dL (ref 30.0–36.0)
MCV: 88.9 fL (ref 78.0–100.0)
Platelets: 381 10*3/uL (ref 150–400)
RBC: 3.6 MIL/uL — AB (ref 4.22–5.81)
RDW: 14.8 % (ref 11.5–15.5)
WBC: 10.8 10*3/uL — ABNORMAL HIGH (ref 4.0–10.5)

## 2014-05-17 LAB — TYPE AND SCREEN
ABO/RH(D): O POS
Antibody Screen: NEGATIVE

## 2014-05-17 LAB — BASIC METABOLIC PANEL
Anion gap: 11 (ref 5–15)
BUN: 84 mg/dL — ABNORMAL HIGH (ref 6–20)
CO2: 15 mmol/L — AB (ref 22–32)
Calcium: 8.3 mg/dL — ABNORMAL LOW (ref 8.9–10.3)
Chloride: 109 mmol/L (ref 101–111)
Creatinine, Ser: 9.47 mg/dL — ABNORMAL HIGH (ref 0.61–1.24)
GFR calc non Af Amer: 4 mL/min — ABNORMAL LOW (ref >60)
GFR, EST AFRICAN AMERICAN: 5 mL/min — AB (ref >60)
GLUCOSE: 105 mg/dL — AB (ref 70–99)
POTASSIUM: 3.5 mmol/L (ref 3.5–5.1)
Sodium: 135 mmol/L (ref 135–145)

## 2014-05-17 LAB — PROTIME-INR
INR: >10 (ref 0.00–1.49)
INR: 4.38 — ABNORMAL HIGH (ref 0.00–1.49)
PROTHROMBIN TIME: 42.1 s — AB (ref 11.6–15.2)

## 2014-05-17 LAB — URINE MICROSCOPIC-ADD ON

## 2014-05-17 LAB — ABO/RH: ABO/RH(D): O POS

## 2014-05-17 LAB — SODIUM, URINE, RANDOM: SODIUM UR: 31 mmol/L

## 2014-05-17 LAB — CREATININE, URINE, RANDOM: Creatinine, Urine: <0.3 mg/dL

## 2014-05-17 LAB — MAGNESIUM: Magnesium: 1.7 mg/dL (ref 1.7–2.4)

## 2014-05-17 MED ORDER — DEXTROSE 5 % IV SOLN
1.0000 g | Freq: Once | INTRAVENOUS | Status: AC
  Administered 2014-05-17: 1 g via INTRAVENOUS
  Filled 2014-05-17: qty 10

## 2014-05-17 MED ORDER — POLYETHYLENE GLYCOL 3350 17 G PO PACK: 17.0000 g | PACK | Freq: Every day | ORAL | Status: DC | PRN

## 2014-05-17 MED ORDER — LOPERAMIDE HCL 2 MG PO CAPS: 2.0000 mg | ORAL_CAPSULE | Freq: Three times a day (TID) | ORAL | Status: DC | PRN

## 2014-05-17 MED ORDER — SODIUM BICARBONATE 650 MG PO TABS
650.0000 mg | ORAL_TABLET | Freq: Two times a day (BID) | ORAL | Status: DC
  Administered 2014-05-18 – 2014-05-20 (×5): 650 mg via ORAL
  Filled 2014-05-17 (×7): qty 1

## 2014-05-17 MED ORDER — ONDANSETRON HCL 4 MG PO TABS: 4.0000 mg | ORAL_TABLET | Freq: Four times a day (QID) | ORAL | Status: DC | PRN

## 2014-05-17 MED ORDER — SODIUM CHLORIDE 0.9 % IR SOLN
3000.0000 mL | Status: DC
  Administered 2014-05-17 (×2): 3000 mL via INTRAVESICAL

## 2014-05-17 MED ORDER — ENSURE COMPLETE PO LIQD
237.0000 mL | Freq: Two times a day (BID) | ORAL | Status: DC
  Administered 2014-05-18: 237 mL via ORAL
  Filled 2014-05-17 (×3): qty 237

## 2014-05-17 MED ORDER — DEXTROSE 5 % IV SOLN
1.0000 g | INTRAVENOUS | Status: DC
  Administered 2014-05-18 – 2014-05-19 (×2): 1 g via INTRAVENOUS
  Filled 2014-05-17 (×4): qty 10

## 2014-05-17 MED ORDER — DOCUSATE SODIUM 100 MG PO CAPS
100.0000 mg | ORAL_CAPSULE | Freq: Two times a day (BID) | ORAL | Status: DC
  Administered 2014-05-18 – 2014-05-20 (×4): 100 mg via ORAL
  Filled 2014-05-17 (×7): qty 1

## 2014-05-17 MED ORDER — CARVEDILOL 6.25 MG PO TABS
6.2500 mg | ORAL_TABLET | Freq: Two times a day (BID) | ORAL | Status: DC
  Administered 2014-05-17 – 2014-05-20 (×6): 6.25 mg via ORAL
  Filled 2014-05-17 (×8): qty 1

## 2014-05-17 MED ORDER — ALBUTEROL SULFATE (2.5 MG/3ML) 0.083% IN NEBU: 2.5000 mg | INHALATION_SOLUTION | RESPIRATORY_TRACT | Status: DC | PRN

## 2014-05-17 MED ORDER — SODIUM CHLORIDE 0.9 % IV SOLN
INTRAVENOUS | Status: AC
  Administered 2014-05-17: 15:00:00 via INTRAVENOUS

## 2014-05-17 MED ORDER — ONDANSETRON HCL 4 MG/2ML IJ SOLN: 4.0000 mg | Freq: Four times a day (QID) | INTRAMUSCULAR | Status: DC | PRN

## 2014-05-17 MED ORDER — SODIUM CHLORIDE 0.9 % IV SOLN: 10.0000 mL/h | Freq: Once | INTRAVENOUS | Status: DC

## 2014-05-17 MED ORDER — SODIUM CHLORIDE 0.9 % IV SOLN
INTRAVENOUS | Status: DC
  Administered 2014-05-18: 04:00:00 via INTRAVENOUS

## 2014-05-17 MED ORDER — ACETAMINOPHEN 650 MG RE SUPP: 650.0000 mg | Freq: Four times a day (QID) | RECTAL | Status: DC | PRN

## 2014-05-17 MED ORDER — MAGNESIUM SULFATE 2 GM/50ML IV SOLN
2.0000 g | Freq: Once | INTRAVENOUS | Status: AC
  Administered 2014-05-17: 2 g via INTRAVENOUS
  Filled 2014-05-17: qty 50

## 2014-05-17 MED ORDER — OXYCODONE HCL 5 MG PO TABS: 5.0000 mg | ORAL_TABLET | ORAL | Status: DC | PRN

## 2014-05-17 MED ORDER — ACETAMINOPHEN 325 MG PO TABS: 650.0000 mg | ORAL_TABLET | Freq: Four times a day (QID) | ORAL | Status: DC | PRN

## 2014-05-17 MED ORDER — DEXTROSE 5 % IV SOLN
2.5000 mg | Freq: Once | INTRAVENOUS | Status: AC
  Administered 2014-05-17: 2.5 mg via INTRAVENOUS
  Filled 2014-05-17: qty 0.25

## 2014-05-17 MED ORDER — ALUM & MAG HYDROXIDE-SIMETH 200-200-20 MG/5ML PO SUSP: 30.0000 mL | Freq: Four times a day (QID) | ORAL | Status: DC | PRN

## 2014-05-17 MED ORDER — SORBITOL 70 % SOLN: 30.0000 mL | Freq: Every day | Status: DC | PRN

## 2014-05-17 MED ORDER — HALOPERIDOL 0.5 MG PO TABS
0.5000 mg | ORAL_TABLET | Freq: Two times a day (BID) | ORAL | Status: DC | PRN
  Filled 2014-05-17: qty 1

## 2014-05-17 NOTE — Consult Note (Signed)
Urology Consult   Physician requesting consult: Lajean Saver  Reason for consult: Hematuria  History of Present Illness: Justin Lang. is a 81 y.o. male with a history of right hydronephrosis in a functionally solitary kidney along with numerous other issues including metastatic colon cancer and anticoagulation with Coumadin.  He has a history of right sided ureteral stenosis that has been managed with a right ureteral stent.  It was most recently changed in March of this year and at that time a bladder stone was also treated.  He suffers from bladder outlet obstruction 2/2 enlarged prostate. Notably, his metastatic colon cancer is being managed with supportive care only at this time.  His family notes a recent decline in his overall health status with decreased appetite and some weight loss.  Yesterday he began experiencing significant hematuria and was brought to the ER today for further evaluation.   He has not had any subjective fevers at home.  He state that he is otherwise feeling well at this time - just a little tired.  In the ER, he was found to have gross hematuria, creatinine elevated to 9.47 from baseline of 4.07 and INR > 10.  Vitals signs stable and within normal.  ER limits.  ER team placed a three way foley catheter but did not have success irrigating the catheter.   Past Medical History  Diagnosis Date  . DVT (deep venous thrombosis)     hx of s/p Greenfield filter  . OAB (overactive bladder)   . Arthritis   . History of nephrolithiasis   . History of ankle fracture   . 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  . Peripheral neuropathy   . Vitamin B12 deficiency   . Symptomatic bradycardia   . Sinoatrial node dysfunction   . Pacemaker 4 yrs ago  . Allergy     celebrex  . History of radiation therapy 09/02/00-10/07/00    recurrent colon cancer/left periaortic nodal recurrence=4500cGy/25 fxs,Dr.Wu  . Hx SBO     multiple intermittent and surgery  .  History of chemotherapy     "stopped chemo due to heart stopping"  . History of kidney stones   . Colon cancer     hx of metastic- treated with surgery and readiation  . Anxiety     "due to memory loss"  . Short-term memory loss   . GERD (gastroesophageal reflux disease)     hx of    Past Surgical History  Procedure Laterality Date  . Colectomy      sigmoid secondary to colon ca- 1997  . Exploratory laparotomy      metastatic retroperitoneal lymph node- 2002  . Total knee arthroplasty      right-1991; left-1992  . Back surgeries      x3  . Filter replacement    . Left ankle surgery    . Incisional herniorraphy    . Pacemaker insertion  05/19/2008  . Replacement total knee bilateral    . Colon resection    . Colonoscopy  08/18/2010    Procedure: COLONOSCOPY;  Surgeon: Rogene Houston, MD;  Location: [facility];  Service: Endoscopy;  Laterality: N/A;  . Esophagogastroduodenoscopy  08/18/2010    Procedure: ESOPHAGOGASTRODUODENOSCOPY (EGD);  Surgeon: Rogene Houston, MD;  Location: [facility];  Service: Endoscopy;  Laterality: N/A;  . Partial colectomy  11/28/2011    Procedure: PARTIAL COLECTOMY;  Surgeon: Jamesetta So, MD;  Location: [facility];  Service:  General;  Laterality: N/A;  . Laparoscopic ileocecectomy  11/28/11  . Appendectomy    . Hernia repair    . Tonsillectomy      as child  . Lithotripsy    . Cataract extraction Bilateral   . Cystoscopy w/ ureteral stent placement Right 03/17/2014    Procedure: CYSTOSCOPY WITH STENT REPLACEMENT;  Surgeon: Bernestine Amass, MD;  Location: [facility];  Service: Urology;  Laterality: Right;  . Holmium laser application N/A 6/0/6301    Procedure: HOLMIUM LASER APPLICATION OF BLADDER STONE;  Surgeon: Bernestine Amass, MD;  Location: [facility];  Service: Urology;  Laterality: N/A;    Medications:  Home meds:    Medication List    ASK your doctor about these medications        carvedilol 6.25 MG tablet  Commonly known as:  COREG  Take  6.25 mg by mouth 2 (two) times daily with a meal.     feeding supplement (ENSURE COMPLETE) Liqd  Take 237 mLs by mouth 2 (two) times daily between meals.     fluticasone 0.05 % cream  Commonly known as:  CUTIVATE  Apply 1 application topically daily as needed (Applied to face when needed for irritation).     haloperidol 0.5 MG tablet  Commonly known as:  HALDOL  Take 1 tablet (0.5 mg total) by mouth every 12 (twelve) hours as needed for agitation.     loperamide 2 MG capsule  Commonly known as:  IMODIUM  Take 2 mg by mouth 3 (three) times daily as needed for diarrhea or loose stools (diarrhea).     morphine 20 MG/ML concentrated solution  Commonly known as:  ROXANOL  Take 0.25-0.5 mLs (5-10 mg total) by mouth every 2 (two) hours as needed for severe pain.     warfarin 5 MG tablet  Commonly known as:  COUMADIN  Take 5 mg by mouth daily or as directed by physician        Scheduled Meds:  Continuous Infusions: . sodium chloride 125 mL/hr at 05/17/14 1452  . sodium chloride    . phytonadione (VITAMIN K) IV 2.5 mg (05/17/14 1451)   PRN Meds:.  Allergies:  Allergies  Allergen Reactions  . Vectibix [Panitumumab] Other (See Comments)    On 08/20/12 during Vectibix infusion at [facility] developed difficulty swallowing, unable answer questions due to lips/throat feeling "funny", confusion, with no affect. Has difficulty getting words out.  . Celecoxib Rash    CELEBREX    Family History  Problem Relation Age of Onset  . Anesthesia problems Neg Hx   . Hypotension Neg Hx   . Malignant hyperthermia Neg Hx   . Pseudochol deficiency Neg Hx   . Cancer Sister     Social History:  reports that he has never smoked. He has never used smokeless tobacco. He reports that he does not drink alcohol or use illicit drugs.  ROS: A complete review of systems was performed.  All systems are negative except for pertinent findings as noted.  Physical Exam:  Vital signs in last 24 hours: Temp:   [97.8 F (36.6 C)-97.9 F (36.6 C)] 97.8 F (36.6 C) (05/02 1254) Pulse Rate:  [66-68] 66 (05/02 1254) Resp:  [16] 16 (05/02 1254) BP: (156-158)/(77-78) 158/78 mmHg (05/02 1254) SpO2:  [93 %-94 %] 94 % (05/02 1254) Constitutional:  Alert and oriented, No acute distress Cardiovascular: Regular rate and rhythm, No JVD Respiratory: Normal respiratory effort, Lungs clear bilaterally GI: Abdomen is soft, nontender, nondistended, no  abdominal masses Genitourinary: No CVAT. Normal male phallus, testes are descended bilaterally and non-tender and without masses, scrotum is normal in appearance without lesions or masses, perineum is normal on inspection. Lymphatic: No lymphadenopathy Neurologic: Grossly intact, no focal deficits Psychiatric: Normal mood and affect  Laboratory Data:   Recent Labs  05/17/14 1120  WBC 10.8*  HGB 10.3*  HCT 32.0*  PLT 381     Recent Labs  05/17/14 1120  NA 135  K 3.5  CL 109  GLUCOSE 105*  BUN 84*  CALCIUM 8.3*  CREATININE 9.47*     Results for orders placed or performed during the hospital encounter of 05/17/14 (from the past 24 hour(s))  Urinalysis, Routine w reflex microscopic (if pt has temp above 100.27F)     Status: Abnormal   Collection Time: 05/17/14 11:00 AM  Result Value Ref Range   Color, Urine RED (A) YELLOW   APPearance TURBID (A) CLEAR   Specific Gravity, Urine 1.023 1.005 - 1.030   pH 6.0 5.0 - 8.0   Glucose, UA NEGATIVE NEGATIVE mg/dL   Hgb urine dipstick LARGE (A) NEGATIVE   Bilirubin Urine LARGE (A) NEGATIVE   Ketones, ur 40 (A) NEGATIVE mg/dL   Protein, ur >300 (A) NEGATIVE mg/dL   Urobilinogen, UA 1.0 0.0 - 1.0 mg/dL   Nitrite POSITIVE (A) NEGATIVE   Leukocytes, UA LARGE (A) NEGATIVE  Urine microscopic-add on     Status: Abnormal   Collection Time: 05/17/14 11:00 AM  Result Value Ref Range   RBC / HPF TOO NUMEROUS TO COUNT <3 RBC/hpf   Bacteria, UA MANY (A) RARE   Urine-Other URINALYSIS PERFORMED ON SUPERNATANT    CBC     Status: Abnormal   Collection Time: 05/17/14 11:20 AM  Result Value Ref Range   WBC 10.8 (H) 4.0 - 10.5 K/uL   RBC 3.60 (L) 4.22 - 5.81 MIL/uL   Hemoglobin 10.3 (L) 13.0 - 17.0 g/dL   HCT 32.0 (L) 39.0 - 52.0 %   MCV 88.9 78.0 - 100.0 fL   MCH 28.6 26.0 - 34.0 pg   MCHC 32.2 30.0 - 36.0 g/dL   RDW 14.8 11.5 - 15.5 %   Platelets 381 150 - 400 K/uL  Basic metabolic panel     Status: Abnormal   Collection Time: 05/17/14 11:20 AM  Result Value Ref Range   Sodium 135 135 - 145 mmol/L   Potassium 3.5 3.5 - 5.1 mmol/L   Chloride 109 101 - 111 mmol/L   CO2 15 (L) 22 - 32 mmol/L   Glucose, Bld 105 (H) 70 - 99 mg/dL   BUN 84 (H) 6 - 20 mg/dL   Creatinine, Ser 9.47 (H) 0.61 - 1.24 mg/dL   Calcium 8.3 (L) 8.9 - 10.3 mg/dL   GFR calc non Af Amer 4 (L) >60 mL/min   GFR calc Af Amer 5 (L) >60 mL/min   Anion gap 11 5 - 15  Protime-INR     Status: Abnormal   Collection Time: 05/17/14 11:20 AM  Result Value Ref Range   Prothrombin Time >90.0 (H) 11.6 - 15.2 seconds   INR >10.00 (HH) 0.00 - 1.49  Type and screen     Status: None   Collection Time: 05/17/14  1:56 PM  Result Value Ref Range   ABO/RH(D) O POS    Antibody Screen NEG    Sample Expiration 05/20/2014   Prepare fresh frozen plasma     Status: None (Preliminary result)   Collection Time:  05/17/14  2:03 PM  Result Value Ref Range   Unit Number [id number]    Blood Component Type THAWED PLASMA    Unit division 00    Status of Unit ALLOCATED    Transfusion Status OK TO TRANSFUSE    Unit Number [id number]    Blood Component Type THW PLS APHR    Unit division 00    Status of Unit ALLOCATED    Transfusion Status OK TO TRANSFUSE   ABO/Rh     Status: None   Collection Time: 05/17/14  2:03 PM  Result Value Ref Range   ABO/RH(D) O POS    No results found for this or any previous visit (from the past 240 hour(s)).  Renal Function:  Recent Labs  05/17/14 1120  CREATININE 9.47*   CrCl cannot be calculated  (Unknown ideal weight.).  Radiologic Imaging: Ct Abdomen Pelvis Wo Contrast  05/17/2014   CLINICAL DATA:  Known metastatic colon carcinoma with three-day history of lower abdominal pain and diarrhea. Chronic hematuria  EXAM: CT ABDOMEN AND PELVIS WITHOUT CONTRAST  TECHNIQUE: Multidetector CT imaging of the abdomen and pelvis was performed following the standard protocol without oral or intravenous contrast material administration.  COMPARISON:  October 02, 2013 and October 14, 2011  FINDINGS: There is scarring in the lung bases. There is no lung base edema or consolidation. Pacemaker leads are attached to the right atrium and right ventricle. There are scattered foci of coronary artery calcification. There is a focal hiatal hernia.  There is a cystic appearing lesion in the right lobe of the liver toward the dome measuring 2.1 x 1.8 cm. This lesion appears benign and has remained stable for over 2 years. There are several tiny liver lesions which are stable which may represent small cysts. In the medial segment of the left lobe of the liver, there is a new ill-defined area of decreased attenuation measuring 3.2 x 2.6 cm, best seen on axial slice 13 series 2, a finding that must be concerning for potential neoplastic focus given the change from the previous study.  Gallbladder is absent.  There is no biliary duct dilatation.  Spleen, pancreas, and adrenals appear normal.  The left kidney is atrophic. There are stable calculi in the left extrarenal pelvis, largest measuring 1.0 x 0.7 cm. No new calculi are identified on the left. The left ureter is not dilated. On the right, there is severe hydronephrosis with a double-J stent extending from the right renal pelvis to the bladder. There is no mass or calculus on the right.  There is a mass arising from the omentum in the right lower quadrant which surrounds the right ureter measuring 4.2 x 3.5 cm. This mass has increased in size compared to prior study.  In the  pelvis, there is a Foley catheter in the bladder. The contour of the urinary bladder is diffusely irregular with irregular thickening most severe in posterior basilar region of the bladder. Air within the bladder is probably due to instrumentation. Note that the distal aspect of the right double-J catheter tip is in the bladder. There is no mass outside of the bladder. There are multiple sigmoid diverticula without diverticulitis. Appendix is absent. Fat is noted in each inguinal ring.  There is no bowel obstruction. No free air or portal venous air. There is evidence of previous surgery in the left and mid abdominal regions with anastomoses of bowel patent in these areas. There is a filter in the inferior vena cava. There is  no ascites, adenopathy, or abscess in the an or pelvis. There is atherosclerotic change in the aorta and iliac arteries without aneurysm. There is extensive degenerative type change in lumbar spine. There are no blastic or lytic bone lesions.  IMPRESSION: The right lower quadrant omental metastasis surrounding and encasing the right ureter has increased in size. There is a new subtle mass in the medial segment left lobe of the liver concerning for metastatic disease within the liver. There is a smaller cyst in the right lobe of the liver which is stable.  There is severe hydronephrosis on the right with double-J stent extending from the right renal pelvis to the bladder, traversing the area of encasing metastasis in the right lower quadrant.  Left kidney is atrophic and contains several calculi in the renal pelvis region. No appreciable hydronephrosis on the left.  Irregularity in the urinary bladder. Question chronic inflammation versus tumor within the bladder. Both entities may exist concurrently. Air in the urinary bladder is probably due to the Foley catheter.  No bowel obstruction. No abscess. Moderate hiatal hernia present. Areas of postoperative change with patent bowel anastomoses.  Filter noted in inferior vena cava.   Electronically Signed   By: Lowella Grip III M.D.   On: 05/17/2014 13:59    I independently reviewed the above imaging studies.  Impression/Recommendation 81 yo with complex medical history who presents with acute on chronic renal failure, elevated INR and gross hematuria.  CT scan shows hydronephrosis with right ureteral stent in good position.  Foley catheter was misplaced at the time of CT and bladder mildly distended.  I replaced foley in the ER with 33f 3-way hematuria catheter and irrigated a small amount of old clot out of the bladder.  At the time of placement he had around 150cc of urine retained in the bladder.  Continuous bladder irrigation was started and at slow drip rate drainage was light pink.  Acute renal failure is likely multifactorial.  The stent appears to be in place on the CT scan and given that it was placed recently in March we do not think that this necessarily represents stent failure.  One option would be to insert a percutaneous nephrostomy tube to maximally drain the kidney but with an INR > 10 there would be a very high bleeding risk.  Given that he is known to have bladder outlet obstruction, hopefully bladder decompression with the foley catheter will improve drainage and kidney function.  Recommend monitoring of kidney function with catheter in - will reconsider other measure pending this progress.  His hematuria is certainly associated with deranged INR.  Minimal CBI requirement suggests hematuria is not that severe at this time.  Recommend continuing bladder irrigation until INR has been corrected.  We will continue to follow.  Thank you for this consultation.

## 2014-05-17 NOTE — ED Notes (Signed)
Bed: WA17 Expected date:  Expected time:  Means of arrival:  Comments: EMS- elderly, hematuria, orthostatic

## 2014-05-17 NOTE — ED Provider Notes (Addendum)
CSN: [id number]     Arrival date & time 05/17/14  1008 History   First MD Initiated Contact with Patient 05/17/14 1021     Chief Complaint  Patient presents with  . Hematuria     (Consider location/radiation/quality/duration/timing/severity/associated sxs/prior Treatment) Patient is a 81 y.o. male presenting with hematuria. The history is provided by the patient, the spouse and a relative.  Hematuria Pertinent negatives include no chest pain, no abdominal pain, no headaches and no shortness of breath.  Patient w hx recurrent dvt/pe, on chronic coumadin tx, hx metastatic colon ca w peritoneal mets requiring right ureteral stent, bil kidney stones and bladder stone, c/o blood in urine in the past day. Moderate amount w clots. No recent gu instrumentation. No other abnormal bleeding or bruising. No fever or chills. No dysuria. Pt unsure if able to empty bladder. Family indicates hospice nurse/covering md referred to come to ER for evaluation.     Past Medical History  Diagnosis Date  . DVT (deep venous thrombosis)     hx of s/p Greenfield filter  . OAB (overactive bladder)   . Arthritis   . History of nephrolithiasis   . History of ankle fracture   . 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  . Peripheral neuropathy   . Vitamin B12 deficiency   . Symptomatic bradycardia   . Sinoatrial node dysfunction   . Pacemaker 4 yrs ago  . Allergy     celebrex  . History of radiation therapy 09/02/00-10/07/00    recurrent colon cancer/left periaortic nodal recurrence=4500cGy/25 fxs,Dr.Wu  . Hx SBO     multiple intermittent and surgery  . History of chemotherapy     "stopped chemo due to heart stopping"  . History of kidney stones   . Colon cancer     hx of metastic- treated with surgery and readiation  . Anxiety     "due to memory loss"  . Short-term memory loss   . GERD (gastroesophageal reflux disease)     hx of   Past Surgical History  Procedure Laterality Date  .  Colectomy      sigmoid secondary to colon ca- 1997  . Exploratory laparotomy      metastatic retroperitoneal lymph node- 2002  . Total knee arthroplasty      right-1991; left-1992  . Back surgeries      x3  . Filter replacement    . Left ankle surgery    . Incisional herniorraphy    . Pacemaker insertion  05/19/2008  . Replacement total knee bilateral    . Colon resection    . Colonoscopy  08/18/2010    Procedure: COLONOSCOPY;  Surgeon: Rogene Houston, MD;  [facility]: [facility];  Service: Endoscopy;  Laterality: N/A;  . Esophagogastroduodenoscopy  08/18/2010    Procedure: ESOPHAGOGASTRODUODENOSCOPY (EGD);  Surgeon: Rogene Houston, MD;  [facility]: [facility];  Service: Endoscopy;  Laterality: N/A;  . Partial colectomy  11/28/2011    Procedure: PARTIAL COLECTOMY;  Surgeon: Jamesetta So, MD;  Location: [facility];  Service: General;  Laterality: N/A;  . Laparoscopic ileocecectomy  11/28/11  . Appendectomy    . Hernia repair    . Tonsillectomy      as child  . Lithotripsy    . Cataract extraction Bilateral   . Cystoscopy w/ ureteral stent placement Right 03/17/2014    Procedure: CYSTOSCOPY WITH STENT REPLACEMENT;  Surgeon: Bernestine Amass, MD;  Location: [facility];  Service: Urology;  Laterality: Right;  . Holmium laser application N/A 06/16/9507    Procedure: HOLMIUM LASER APPLICATION OF BLADDER STONE;  Surgeon: Bernestine Amass, MD;  Location: [facility];  Service: Urology;  Laterality: N/A;   Family History  Problem Relation Age of Onset  . Anesthesia problems Neg Hx   . Hypotension Neg Hx   . Malignant hyperthermia Neg Hx   . Pseudochol deficiency Neg Hx   . Cancer Sister    History  Substance Use Topics  . Smoking status: Never Smoker   . Smokeless tobacco: Never Used  . Alcohol Use: No    Review of Systems  Constitutional: Negative for fever.  HENT: Negative for nosebleeds.   Eyes: Negative for redness.  Respiratory: Negative for shortness of breath.   Cardiovascular: Negative  for chest pain.  Gastrointestinal: Negative for vomiting, abdominal pain and blood in stool.  Genitourinary: Positive for hematuria. Negative for dysuria and flank pain.  Musculoskeletal: Negative for back pain and neck pain.  Skin: Negative for rash.  Neurological: Negative for headaches.  Hematological: Does not bruise/bleed easily.  Psychiatric/Behavioral: Negative for confusion.      Allergies  Vectibix and Celecoxib  Home Medications   Prior to Admission medications   Medication Sig Start Date End Date Taking? Authorizing Provider  carvedilol (COREG) 6.25 MG tablet Take 6.25 mg by mouth 2 (two) times daily with a meal.    Historical Provider, MD  feeding supplement, ENSURE COMPLETE, (ENSURE COMPLETE) LIQD Take 237 mLs by mouth 2 (two) times daily between meals. 10/11/13   Barton Dubois, MD  fluticasone (CUTIVATE) 0.05 % cream Apply 1 application topically daily as needed (Applied to face when needed for irritation).  09/08/13   Historical Provider, MD  haloperidol (HALDOL) 0.5 MG tablet Take 1 tablet (0.5 mg total) by mouth every 12 (twelve) hours as needed for agitation. Patient not taking: Reported on 02/23/2014 10/11/13   Barton Dubois, MD  loperamide (IMODIUM) 2 MG capsule Take 2 mg by mouth 3 (three) times daily as needed for diarrhea or loose stools.    Historical Provider, MD  morphine (ROXANOL) 20 MG/ML concentrated solution Take 0.25-0.5 mLs (5-10 mg total) by mouth every 2 (two) hours as needed for severe pain. Patient not taking: Reported on 02/23/2014 11/16/13   Ladell Pier, MD  warfarin (COUMADIN) 5 MG tablet Take 5 mg by mouth daily or as directed by physician 04/19/14   Ladell Pier, MD   BP 156/77 mmHg  Pulse 68  Temp(Src) 97.9 F (36.6 C) (Oral)  Resp 16  SpO2 93% Physical Exam  Constitutional: He is oriented to person, place, and time. He appears well-developed and well-nourished. No distress.  HENT:  Mouth/Throat: Oropharynx is clear and moist.  Eyes:  Conjunctivae are normal.  Neck: Neck supple. No tracheal deviation present.  Cardiovascular: Normal rate and intact distal pulses.   Pulmonary/Chest: Effort normal. No accessory muscle usage. No respiratory distress.  Abdominal: Soft. Bowel sounds are normal. He exhibits no distension and no mass. There is no tenderness. There is no rebound and no guarding.  Genitourinary:  Normal external exam. Dried blood about skin of penis and perineal area, no blood at meatus. No scrotal or testicular pain, swelling, or tenderness.   Musculoskeletal: Normal range of motion. He exhibits no edema.  Neurological: He is alert and oriented to person, place, and time.  Skin: Skin is warm and dry. He is not diaphoretic.  Psychiatric: He has a normal mood and affect.  Nursing  note and vitals reviewed.   ED Course  Procedures (including critical care time) Labs Review  Results for orders placed or performed during the hospital encounter of 05/17/14  Urinalysis, Routine w reflex microscopic (if pt has temp above 100.55F)  Result Value Ref Range   Color, Urine RED (A) YELLOW   APPearance TURBID (A) CLEAR   Specific Gravity, Urine 1.023 1.005 - 1.030   pH 6.0 5.0 - 8.0   Glucose, UA NEGATIVE NEGATIVE mg/dL   Hgb urine dipstick LARGE (A) NEGATIVE   Bilirubin Urine LARGE (A) NEGATIVE   Ketones, ur 40 (A) NEGATIVE mg/dL   Protein, ur >300 (A) NEGATIVE mg/dL   Urobilinogen, UA 1.0 0.0 - 1.0 mg/dL   Nitrite POSITIVE (A) NEGATIVE   Leukocytes, UA LARGE (A) NEGATIVE  CBC  Result Value Ref Range   WBC 10.8 (H) 4.0 - 10.5 K/uL   RBC 3.60 (L) 4.22 - 5.81 MIL/uL   Hemoglobin 10.3 (L) 13.0 - 17.0 g/dL   HCT 32.0 (L) 39.0 - 52.0 %   MCV 88.9 78.0 - 100.0 fL   MCH 28.6 26.0 - 34.0 pg   MCHC 32.2 30.0 - 36.0 g/dL   RDW 14.8 11.5 - 15.5 %   Platelets 381 150 - 400 K/uL  Basic metabolic panel  Result Value Ref Range   Sodium 135 135 - 145 mmol/L   Potassium 3.5 3.5 - 5.1 mmol/L   Chloride 109 101 - 111 mmol/L    CO2 15 (L) 22 - 32 mmol/L   Glucose, Bld 105 (H) 70 - 99 mg/dL   BUN 84 (H) 6 - 20 mg/dL   Creatinine, Ser 9.47 (H) 0.61 - 1.24 mg/dL   Calcium 8.3 (L) 8.9 - 10.3 mg/dL   GFR calc non Af Amer 4 (L) >60 mL/min   GFR calc Af Amer 5 (L) >60 mL/min   Anion gap 11 5 - 15  Urine microscopic-add on  Result Value Ref Range   RBC / HPF TOO NUMEROUS TO COUNT <3 RBC/hpf   Bacteria, UA MANY (A) RARE   Urine-Other URINALYSIS PERFORMED ON SUPERNATANT       MDM   Labs.  Foley.  Reviewed nursing notes and prior charts for additional history.   Foley irrigated to clear.  Labs return, pt with significant acute kidney injury. Dehydration.  Uti.   Iv ns bolus. Rocephin iv.   Medical service contacted for admission.  Urology, Dr Junious Silk consulted re hematuria, clots, ?obstructive uropathy/AKI - he will see in ED.  INR subsequently returns > 10 - the hospitalists request vit k 2.5 iv - ordered, will also transfuse 2 units ffp. They will recheck inr.      Lajean Saver, MD 05/17/14 1328

## 2014-05-17 NOTE — ED Notes (Signed)
Family members report patient has Stage 4 colon cancer, he was diagnosed to Stage 4 two years ago

## 2014-05-17 NOTE — ED Notes (Signed)
Pt has INR >10, RN Jakeema made aware.

## 2014-05-17 NOTE — ED Notes (Signed)
Per LAB - need recollect Blue top.

## 2014-05-17 NOTE — ED Notes (Signed)
Pt states he has blood in his urine for about a week.  He was brought in by EMS for orthstatic changes.   His initial blood pressure was 140/90 and once he stood up it was 104/60.  He was given 200 ml of NS.  Patient is a home hospice.  He has a pacemaker.  He denies any N/V or diarrhea.  He has some pain when he urinates.

## 2014-05-17 NOTE — ED Notes (Signed)
MD at bedside. 

## 2014-05-17 NOTE — H&P (Signed)
[facility] History and Physical  Justin Lang. ZOX:[id number] DOB: [birth year] DOA: 05/17/2014  Referring physician: Dr. Ashok Cordia PCP: Delphina Cahill, MD   Chief Complaint: Blood in urine  HPI: Justin Lang. is a 81 y.o. male  With history of DVT/PE on chronic anticoagulation status post IVC filter placement, metastatic colorectal cancer status post resection of cecum/appendiceal mass November 2013 (T4 N0) status post adjuvant chemotherapy, obstructive nephropathy status post placement of percutaneous nephrostomy tube 10/09/2013 with internalization of right urethral stent 11/19/2013 which was changed recently in March 2016 and at that time bladder stone was treated, on home hospice, history of arrhythmia status post pacemaker placement, cardiomyopathy with a EF of 20-25% per 2-D echo of 10/02/2013, history of bladder outlet obstruction secondary to enlarged prostate who presents to the ED with a 1-2 day history of hematuria which has been worsening. Patient with complaints of dysuria and suprapubic abdominal pain. Patient also endorses chronic loose stools patient denies any fever, no chills, no nausea, no vomiting, no chest pain, no shortness of breath, no cough, no melena, no hematemesis, no hematochezia, no weakness. Patient was seen in the emergency room, basic metabolic profile had a BUN of 84 bicarbonate of 15 creatinine of 9.74. CBC had a white count of 10.8 hemoglobin of 10.3 otherwise was within normal limits. INR was greater than 10. Urinalysis was turbid with large bilirubin many bacteria large leukocytes nitrite positive graded and 300 proteinuria, too numerous to count RBCs. [facility] were called to admit the patient for further evaluation and management.   Review of Systems: As to history of present illness otherwise negative.  Constitutional:  No weight loss, night sweats, Fevers, chills, fatigue.  HEENT:  No headaches, Difficulty swallowing,Tooth/dental  problems,Sore throat,  No sneezing, itching, ear ache, nasal congestion, post nasal drip,  Cardio-vascular:  No chest pain, Orthopnea, PND, swelling in lower extremities, anasarca, dizziness, palpitations  GI:  No heartburn, indigestion, abdominal pain, nausea, vomiting, diarrhea, change in bowel habits, loss of appetite  Resp:  No shortness of breath with exertion or at rest. No excess mucus, no productive cough, No non-productive cough, No coughing up of blood.No change in color of mucus.No wheezing.No chest wall deformity  Skin:  no rash or lesions.  GU:  no dysuria, change in color of urine, no urgency or frequency. No flank pain.  Musculoskeletal:  No joint pain or swelling. No decreased range of motion. No back pain.  Psych:  No change in mood or affect. No depression or anxiety. No memory loss.   Past Medical History  Diagnosis Date  . DVT (deep venous thrombosis)     hx of s/p [place] filter  . OAB (overactive bladder)   . Arthritis   . History of nephrolithiasis   . History of ankle fracture   . 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  . Peripheral neuropathy   . Vitamin B12 deficiency   . Symptomatic bradycardia   . Sinoatrial node dysfunction   . Pacemaker 4 yrs ago  . Allergy     celebrex  . History of radiation therapy 09/02/00-10/07/00    recurrent colon cancer/left periaortic nodal recurrence=4500cGy/25 fxs,Dr.Wu  . Hx SBO     multiple intermittent and surgery  . History of chemotherapy     "stopped chemo due to heart stopping"  . History of kidney stones   . Colon cancer     hx of metastic- treated with surgery and readiation  . Anxiety     "  due to memory loss"  . Short-term memory loss   . GERD (gastroesophageal reflux disease)     hx of   Past Surgical History  Procedure Laterality Date  . Colectomy      sigmoid secondary to colon ca- 1997  . Exploratory laparotomy      metastatic retroperitoneal lymph node- 2002  . Total knee  arthroplasty      right-1991; left-1992  . Back surgeries      x3  . Filter replacement    . Left ankle surgery    . Incisional herniorraphy    . Pacemaker insertion  05/19/2008  . Replacement total knee bilateral    . Colon resection    . Colonoscopy  08/18/2010    Procedure: COLONOSCOPY;  Surgeon: Rogene Houston, MD;  Location: [facility];  Service: Endoscopy;  Laterality: N/A;  . Esophagogastroduodenoscopy  08/18/2010    Procedure: ESOPHAGOGASTRODUODENOSCOPY (EGD);  Surgeon: Rogene Houston, MD;  Location: [facility];  Service: Endoscopy;  Laterality: N/A;  . Partial colectomy  11/28/2011    Procedure: PARTIAL COLECTOMY;  Surgeon: Jamesetta So, MD;  Location: [facility];  Service: General;  Laterality: N/A;  . Laparoscopic ileocecectomy  11/28/11  . Appendectomy    . Hernia repair    . Tonsillectomy      as child  . Lithotripsy    . Cataract extraction Bilateral   . Cystoscopy w/ ureteral stent placement Right 03/17/2014    Procedure: CYSTOSCOPY WITH STENT REPLACEMENT;  Surgeon: Bernestine Amass, MD;  Location: [facility];  Service: Urology;  Laterality: Right;  . Holmium laser application N/A 07/18/5327    Procedure: HOLMIUM LASER APPLICATION OF BLADDER STONE;  Surgeon: Bernestine Amass, MD;  Location: [facility];  Service: Urology;  Laterality: N/A;   Social History:  reports that he has never smoked. He has never used smokeless tobacco. He reports that he does not drink alcohol or use illicit drugs.  Allergies  Allergen Reactions  . Vectibix [Panitumumab] Other (See Comments)    On 08/20/12 during Vectibix infusion at [facility] developed difficulty swallowing, unable answer questions due to lips/throat feeling "funny", confusion, with no affect. Has difficulty getting words out.  . Celecoxib Rash    CELEBREX    Family History  Problem Relation Age of Onset  . Anesthesia problems Neg Hx   . Hypotension Neg Hx   . Malignant hyperthermia Neg Hx   . Pseudochol deficiency Neg Hx   . Cancer  Sister      Prior to Admission medications   Medication Sig Start Date End Date Taking? Authorizing Provider  carvedilol (COREG) 6.25 MG tablet Take 6.25 mg by mouth 2 (two) times daily with a meal.    Historical Provider, MD  feeding supplement, ENSURE COMPLETE, (ENSURE COMPLETE) LIQD Take 237 mLs by mouth 2 (two) times daily between meals. 10/11/13   Barton Dubois, MD  fluticasone (CUTIVATE) 0.05 % cream Apply 1 application topically daily as needed (Applied to face when needed for irritation).  09/08/13   Historical Provider, MD  haloperidol (HALDOL) 0.5 MG tablet Take 1 tablet (0.5 mg total) by mouth every 12 (twelve) hours as needed for agitation. Patient not taking: Reported on 02/23/2014 10/11/13   Barton Dubois, MD  loperamide (IMODIUM) 2 MG capsule Take 2 mg by mouth 3 (three) times daily as needed for diarrhea or loose stools.    Historical Provider, MD  morphine (ROXANOL) 20 MG/ML concentrated solution Take 0.25-0.5 mLs (5-10 mg  total) by mouth every 2 (two) hours as needed for severe pain. Patient not taking: Reported on 02/23/2014 11/16/13   Ladell Pier, MD  warfarin (COUMADIN) 5 MG tablet Take 5 mg by mouth daily or as directed by physician 04/19/14   Ladell Pier, MD   Physical Exam: Filed Vitals:   05/17/14 1009 05/17/14 1254  BP: 156/77 158/78  Pulse: 68 66  Temp: 97.9 F (36.6 C) 97.8 F (36.6 C)  TempSrc: Oral Oral  Resp: 16 16  SpO2: 93% 94%    Wt Readings from Last 3 Encounters:  03/17/14 79.379 kg (175 lb)  02/23/14 76.567 kg (168 lb 12.8 oz)  11/03/13 72.938 kg (160 lb 12.8 oz)    General:   elderly gentleman lying on gurney in no acute cardiopulmonary distress. Somewhat confused.  Eyes: PERRLA, EOMI, normal lids, irises & conjunctiva ENT: grossly normal hearing, lips & tongue.dry mucous membranes.  Neck: no LAD, masses or thyromegaly Cardiovascular: RRR, no m/r/g. No LE edema. Respiratory: CTA bilaterally, no w/r/r. Normal respiratory effort. Abdomen:  soft, nondistended, positive bowel sounds. Tenderness to palpation in the suprapubic region.  Skin: no rash or induration seen on limited exam Musculoskeletal: grossly normal tone BUE/BLE Psychiatric: grossly normal mood and affect, speech fluent and appropriate Neurologic:  alert. Cranial nerves II through XII are grossly intact. No focal deficits. Moving extremities spontaneously.           Labs on Admission:  Basic Metabolic Panel:  Recent Labs Lab 05/17/14 1120  NA 135  K 3.5  CL 109  CO2 15*  GLUCOSE 105*  BUN 84*  CREATININE 9.47*  CALCIUM 8.3*   Liver Function Tests: No results for input(s): AST, ALT, ALKPHOS, BILITOT, PROT, ALBUMIN in the last 168 hours. No results for input(s): LIPASE, AMYLASE in the last 168 hours. No results for input(s): AMMONIA in the last 168 hours. CBC:  Recent Labs Lab 05/17/14 1120  WBC 10.8*  HGB 10.3*  HCT 32.0*  MCV 88.9  PLT 381   Cardiac Enzymes: No results for input(s): CKTOTAL, CKMB, CKMBINDEX, TROPONINI in the last 168 hours.  BNP (last 3 results) No results for input(s): BNP in the last 8760 hours.  ProBNP (last 3 results)  Recent Labs  10/01/13 1845  PROBNP 17357.0*    CBG: No results for input(s): GLUCAP in the last 168 hours.  Radiological Exams on Admission: No results found.  EKG: None  Assessment/Plan Principal Problem:   Hematuria, gross Active Problems:   Memory loss   History of malignant neoplasm of large intestine   Pacemaker   Anticoagulant long-term use   Sinoatrial node dysfunction   DVT (deep venous thrombosis), unspecified laterality   Metastatic cancer   Acute blood loss anemia   Acute renal failure superimposed on stage 4 chronic kidney disease   Supratherapeutic INR   Dehydration   Nephrolithiasis   Acute UTI   Acidosis, metabolic  #1 gross hematuria Likely secondary to supratherapeutic INR which is greater than 10 in the setting of metastatic cancer concern for hydronephrosis  as patient does have a history of urethral stent placement also concern for tumor invasion into the bladder. Foley catheter has been placed however seems to be clogged. Check a CT abdomen and pelvis stone protocol for further evaluation. Patient is to be given 2 units of FFP and vitamin K per ED physician to reverse INR. ED physician [place] spoken with Dr. Junious Silk of urology who will be consulting on the patient. Will likely  need bladder irrigations. Follow H&H.  #2 acute on chronic kidney disease stage IV Patient's baseline creatinine seems to be 4-4.5. Likely secondary to a prerenal azotemia in the setting of poor oral intake versus post renal azotemia secondary to probable hydronephrosis. CT abdomen and pelvis will be done to evaluate for possible hydronephrosis. Check a fractional excretion of sodium. Place on IV fluids. Follow. Urology has been consulted and will be following on patient. If worsening renal function may need to consult with nephrology. Due to patient's age and metastatic cancer currently on hospice likely not a dialysis candidate. Follow.  #3 supratherapeutic INR Will hold Coumadin. Patient to be given 2 units of FFP and vitamin K. Follow PT/INR.  #4 history of DVT/PE s/p IVC filter INR is supratherapeutic. Hold Coumadin.  #5 metastatic colorectal cancer  Patient currently on hospice. Will inform oncology of patient's admission.  #6 dehydration IV fluids.  #7 acute urinary tract infection Urine cultures are pending. Place on IV Rocephin.  #8 acidosis Likely secondary to problem #2. Bicarbonate.  #9 history of memory loss Stable. Monitor.  #10 cardiomyopathy/status post PPM Stable.  #11 acute blood loss anemia Secondary to problem #1. Follow H&H.  #12 prophylaxis SCDs for DVT prophylaxis.   Code Status: DO NOT RESUSCITATE DVT Prophylaxis: SCDs. Family Communication: Updated patient and daughters in law at bedside. Disposition Plan: Admit to MedSurg  Time  spent: 55 minutes  THOMPSON,DANIEL M.D. [facility] Pager [phone]

## 2014-05-18 DIAGNOSIS — C786 Secondary malignant neoplasm of retroperitoneum and peritoneum: Secondary | ICD-10-CM

## 2014-05-18 DIAGNOSIS — C180 Malignant neoplasm of cecum: Secondary | ICD-10-CM

## 2014-05-18 DIAGNOSIS — D500 Iron deficiency anemia secondary to blood loss (chronic): Secondary | ICD-10-CM

## 2014-05-18 DIAGNOSIS — C181 Malignant neoplasm of appendix: Secondary | ICD-10-CM

## 2014-05-18 DIAGNOSIS — R630 Anorexia: Secondary | ICD-10-CM

## 2014-05-18 LAB — COMPREHENSIVE METABOLIC PANEL
ALT: 9 U/L — ABNORMAL LOW (ref 17–63)
AST: 12 U/L — AB (ref 15–41)
Albumin: 2.6 g/dL — ABNORMAL LOW (ref 3.5–5.0)
Alkaline Phosphatase: 51 U/L (ref 38–126)
Anion gap: 13 (ref 5–15)
BUN: 84 mg/dL — ABNORMAL HIGH (ref 6–20)
CALCIUM: 8.5 mg/dL — AB (ref 8.9–10.3)
CO2: 17 mmol/L — ABNORMAL LOW (ref 22–32)
Chloride: 108 mmol/L (ref 101–111)
Creatinine, Ser: 8.44 mg/dL — ABNORMAL HIGH (ref 0.61–1.24)
GFR calc non Af Amer: 5 mL/min — ABNORMAL LOW (ref >60)
GFR, EST AFRICAN AMERICAN: 6 mL/min — AB (ref >60)
Glucose, Bld: 110 mg/dL — ABNORMAL HIGH (ref 70–99)
Potassium: 3.4 mmol/L — ABNORMAL LOW (ref 3.5–5.1)
Sodium: 138 mmol/L (ref 135–145)
TOTAL PROTEIN: 6.8 g/dL (ref 6.5–8.1)
Total Bilirubin: 0.7 mg/dL (ref 0.3–1.2)

## 2014-05-18 LAB — CBC
HCT: 29.5 % — ABNORMAL LOW (ref 39.0–52.0)
Hemoglobin: 9.4 g/dL — ABNORMAL LOW (ref 13.0–17.0)
MCH: 28.1 pg (ref 26.0–34.0)
MCHC: 31.9 g/dL (ref 30.0–36.0)
MCV: 88.3 fL (ref 78.0–100.0)
Platelets: 352 10*3/uL (ref 150–400)
RBC: 3.34 MIL/uL — ABNORMAL LOW (ref 4.22–5.81)
RDW: 14.7 % (ref 11.5–15.5)
WBC: 12.5 10*3/uL — ABNORMAL HIGH (ref 4.0–10.5)

## 2014-05-18 LAB — PROTIME-INR
INR: 1.65 — ABNORMAL HIGH (ref 0.00–1.49)
Prothrombin Time: 19.7 seconds — ABNORMAL HIGH (ref 11.6–15.2)

## 2014-05-18 LAB — URINE CULTURE

## 2014-05-18 LAB — GLUCOSE, CAPILLARY: GLUCOSE-CAPILLARY: 124 mg/dL — AB (ref 70–99)

## 2014-05-18 LAB — APTT: aPTT: 58 seconds — ABNORMAL HIGH (ref 24–37)

## 2014-05-18 MED ORDER — ENSURE ENLIVE PO LIQD
237.0000 mL | Freq: Two times a day (BID) | ORAL | Status: DC
  Administered 2014-05-18: 237 mL via ORAL

## 2014-05-18 MED ORDER — POTASSIUM CHLORIDE IN NACL 40-0.9 MEQ/L-% IV SOLN
INTRAVENOUS | Status: DC
  Administered 2014-05-18 – 2014-05-20 (×2): 50 mL/h via INTRAVENOUS
  Filled 2014-05-18 (×4): qty 1000

## 2014-05-18 NOTE — Progress Notes (Signed)
Pt is a current [place] pt.  Pt's wife wants to take pt home when he is ready for discharge.  Hospice will continue to follow pt and to provide ongoing support to pt and family.

## 2014-05-18 NOTE — Progress Notes (Addendum)
Subjective: Patient reports pain control good.  He is eating lunch and without complaint. Cr down to 8.44 today.  Objective: Vital signs in last 24 hours: Temp:  [97.3 F (36.3 C)-98.4 F (36.9 C)] 97.9 F (36.6 C) (05/03 0617) Pulse Rate:  [56-66] 58 (05/03 0617) Resp:  [16] 16 (05/03 0617) BP: (158-181)/(61-85) 171/65 mmHg (05/03 0617) SpO2:  [94 %-100 %] 99 % (05/03 0341) Weight:  [72.576 kg (160 lb)] 72.576 kg (160 lb) (05/02 1602)  Intake/Output from previous day: 05/02 0701 - 05/03 0700 In: 3032 [Blood:482; IV Piggyback:100] Out: 9850 [Urine:9850] Intake/Output this shift: Total I/O In: -  Out: 1600 [Urine:1600]  Physical Exam:  General:alert, cooperative and no distress GI: soft Foley: CBIs have run out; dark red urine in tubing and pink urine in bag  Lab Results:  Recent Labs  05/17/14 1120 05/18/14 0402  HGB 10.3* 9.4*  HCT 32.0* 29.5*   BMET  Recent Labs  05/17/14 1120 05/18/14 0402  NA 135 138  K 3.5 3.4*  CL 109 108  CO2 15* 17*  GLUCOSE 105* 110*  BUN 84* 84*  CREATININE 9.47* 8.44*  CALCIUM 8.3* 8.5*    Recent Labs  05/17/14 1120 05/17/14 1825 05/18/14 0402  INR >10.00* 4.38* 1.65*   No results for input(s): LABURIN in the last 72 hours. Results for orders placed or performed during the hospital encounter of 10/01/13  Culture, blood (routine x 2) Call MD if unable to obtain prior to antibiotics being given     Status: None   Collection Time: 10/01/13 11:08 PM  Result Value Ref Range Status   Specimen Description BLOOD ARM LEFT  Final   Special Requests   Final    BOTTLES DRAWN AEROBIC AND ANAEROBIC 10CC AZITHROMYCIN, CEFTRIAXONE   Culture  Setup Time   Final    10/02/2013 05:41 Performed at [facility]   Culture   Final    NO GROWTH 5 DAYS Performed at [facility]   Report Status 10/08/2013 FINAL  Final  Culture, blood (routine x 2) Call MD if unable to obtain prior to antibiotics being given     Status:  None   Collection Time: 10/01/13 11:15 PM  Result Value Ref Range Status   Specimen Description BLOOD HAND LEFT  Final   Special Requests   Final    BOTTLES DRAWN AEROBIC ONLY 10CC AZITHROMYCIN, CEFTRIAXONE   Culture  Setup Time   Final    10/02/2013 05:40 Performed at [place]   Final    NO GROWTH 5 DAYS Performed at [facility]   Report Status 10/08/2013 FINAL  Final  Culture, Urine     Status: None   Collection Time: 10/02/13  7:59 AM  Result Value Ref Range Status   Specimen Description URINE, RANDOM  Final   Special Requests NONE  Final   Culture  Setup Time   Final    10/02/2013 14:34 Performed at [place]   Final    6,000 COLONIES/ML Performed at [facility]   Culture   Final    INSIGNIFICANT GROWTH Performed at [facility]   Report Status 10/03/2013 FINAL  Final    Studies/Results: Ct Abdomen Pelvis Wo Contrast  05/17/2014   CLINICAL DATA:  Known metastatic colon carcinoma with three-day history of lower abdominal pain and diarrhea. Chronic hematuria  EXAM: CT ABDOMEN AND PELVIS WITHOUT CONTRAST  TECHNIQUE: Multidetector CT imaging of the  abdomen and pelvis was performed following the standard protocol without oral or intravenous contrast material administration.  COMPARISON:  October 02, 2013 and October 14, 2011  FINDINGS: There is scarring in the lung bases. There is no lung base edema or consolidation. Pacemaker leads are attached to the right atrium and right ventricle. There are scattered foci of coronary artery calcification. There is a focal hiatal hernia.  There is a cystic appearing lesion in the right lobe of the liver toward the dome measuring 2.1 x 1.8 cm. This lesion appears benign and has remained stable for over 2 years. There are several tiny liver lesions which are stable which may represent small cysts. In the medial segment of the left lobe of the liver, there is a new  ill-defined area of decreased attenuation measuring 3.2 x 2.6 cm, best seen on axial slice 13 series 2, a finding that must be concerning for potential neoplastic focus given the change from the previous study.  Gallbladder is absent.  There is no biliary duct dilatation.  Spleen, pancreas, and adrenals appear normal.  The left kidney is atrophic. There are stable calculi in the left extrarenal pelvis, largest measuring 1.0 x 0.7 cm. No new calculi are identified on the left. The left ureter is not dilated. On the right, there is severe hydronephrosis with a double-J stent extending from the right renal pelvis to the bladder. There is no mass or calculus on the right.  There is a mass arising from the omentum in the right lower quadrant which surrounds the right ureter measuring 4.2 x 3.5 cm. This mass has increased in size compared to prior study.  In the pelvis, there is a Foley catheter in the bladder. The contour of the urinary bladder is diffusely irregular with irregular thickening most severe in posterior basilar region of the bladder. Air within the bladder is probably due to instrumentation. Note that the distal aspect of the right double-J catheter tip is in the bladder. There is no mass outside of the bladder. There are multiple sigmoid diverticula without diverticulitis. Appendix is absent. Fat is noted in each inguinal ring.  There is no bowel obstruction. No free air or portal venous air. There is evidence of previous surgery in the left and mid abdominal regions with anastomoses of bowel patent in these areas. There is a filter in the inferior vena cava. There is no ascites, adenopathy, or abscess in the an or pelvis. There is atherosclerotic change in the aorta and iliac arteries without aneurysm. There is extensive degenerative type change in lumbar spine. There are no blastic or lytic bone lesions.  IMPRESSION: The right lower quadrant omental metastasis surrounding and encasing the right ureter  has increased in size. There is a new subtle mass in the medial segment left lobe of the liver concerning for metastatic disease within the liver. There is a smaller cyst in the right lobe of the liver which is stable.  There is severe hydronephrosis on the right with double-J stent extending from the right renal pelvis to the bladder, traversing the area of encasing metastasis in the right lower quadrant.  Left kidney is atrophic and contains several calculi in the renal pelvis region. No appreciable hydronephrosis on the left.  Irregularity in the urinary bladder. Question chronic inflammation versus tumor within the bladder. Both entities may exist concurrently. Air in the urinary bladder is probably due to the Foley catheter.  No bowel obstruction. No abscess. Moderate hiatal hernia present. Areas of postoperative  change with patent bowel anastomoses. Filter noted in inferior vena cava.   Electronically Signed   By: Lowella Grip III M.D.   On: 05/17/2014 13:59    Assessment/Plan: Hematuria: continue CBIs until urine clears;  INR is back in normal range.   Possible UTI: continue Rocephin and f/u urine culture.   Renal Insufficiency: Cr slightly down today.  Per IM.  Continue to monitor. Ureteral stent in good position.    LOS: 1 day   DANCY, AMANDA 05/18/2014, 11:41 AM   Attending attestation: Pt personally seen and examined on rounds. I agree with above. Hematuria much improved today. Cr with some improvement.

## 2014-05-18 NOTE — Progress Notes (Signed)
Patient ID: Justin Kail., male   DOB: 1922-07-24, 81 y.o.   MRN: [id number]  [facility] PROGRESS NOTE  Justin Kail. ZJI:[id number] DOB: [birth year] DOA: 05/17/2014 PCP: Delphina Cahill, MD   Brief narrative:   Pt is 81 yo male with known DVT/PE on chronic anticoagulation and status post IVC filter placement, metastatic colorectal cancer status post resection of cecum/appendiceal mass November 2013 (T4 N0) status post adjuvant chemotherapy, obstructive nephropathy status post placement of percutaneous nephrostomy tube 10/09/2013 with internalization of right urethral stent 11/19/2013 which was changed recently in March 2016 and at that time bladder stone was treated, on home hospice, history of arrhythmia status post pacemaker placement, cardiomyopathy with a EF of 20-25% per 2-D echo of 10/02/2013, history of bladder outlet obstruction secondary to enlarged prostate who presented to the ED with a 1-2 day history of progressively worsening hematuria, dysuria and suprapubic abdominal pain.   In ED, blood work notable for Cr 9.74, Hg 10.3, INR > 10. [facility] asked to admit for further evaluation.  Assessment/Plan:    Principal Problem:   Hematuria, gross - in the setting of supra therapeutic INR - appreciate urology team following, continue with CBI's until urine clears - INR is now 1.65 - continue to monitor Hct/Hg - no indication for transfusion at this time  Active Problems:   UTI - continue Rocephin day #2/7 and follow up on urine cultures   Acute renal failure superimposed on stage 4 chronic kidney disease - likely pre renal etiology imposed on CKD - Cr trending down  - please note no need for nephrology consultation as pt's wishes are clear for no interventions including HD - will repeat BMP in AM   Progressive metastatic colon cancer - appreciate Dr. Benay Spice assistance - his recommendation is stop Coumadin due to risk of bleeding even though he will remain high risk for  thromboembolic disease - family fully understanding and agree with plan - d/c home with hospice when medically ready    History of DVT and PE - d/c Coumadin as noted above    Acute blood loss anemia imposed on ACD, CKD, IDA, malignancy - Hg down over the past 24 hours but no indication for transfusion at this time  - will repeat BMP in AM   Hypokalemia - will supplement and repeat BMP in AM   Metabolic acidosis - secondary to the acute illness - encourage PO intake if pt able to tolerate - continue sodium bicarb supplementation  - continue IVF but add KCl to IVF and repeat BMP in AM   Chronic Severe combined systolic and diastolic CHF, EF 17% with grade II diastolic CHF - difficult medical condition to manage in the setting of renal failure and requirement of IVF  - weight is 72 kg this AM, will monitor daily weights, I/O   Pacemaker   Severe PCM - allow comfort feeding as pt able to tolerate   DVT prophylaxis - SCD's  Code Status: DNR Family Communication:  plan of care discussed with family at bedside  Disposition Plan: Not ready for d/c at this time, still needs daily CBI's until urine clears and will need urine culture report tobe able to readjust the regimen appropriately. Once ready, will go home with hospice.  IV access:  Peripheral IV  Procedures and diagnostic studies:    Ct Abdomen Pelvis Wo Contrast 05/17/2014  The right lower quadrant omental metastasis surrounding and encasing the right ureter has increased in size. There is a  new subtle mass in the medial segment left lobe of the liver concerning for metastatic disease within the liver. There is a smaller cyst in the right lobe of the liver which is stable.  There is severe hydronephrosis on the right with double-J stent extending from the right renal pelvis to the bladder, traversing the area of encasing metastasis in the right lower quadrant.  Left kidney is atrophic and contains several calculi in the renal pelvis  region. No appreciable hydronephrosis on the left.  Irregularity in the urinary bladder. Question chronic inflammation versus tumor within the bladder. Both entities may exist concurrently. Air in the urinary bladder is probably due to the Foley catheter.  No bowel obstruction. No abscess. Moderate hiatal hernia present. Areas of postoperative change with patent bowel anastomoses. Filter noted in inferior vena cava.     Medical Consultants:  Urology Oncologist Dr. Benay Spice   Other Consultants:  PT/OT  IAnti-Infectives:   Rocephin 5/2 -->   Faye Ramsay, MD  [facility] Pager [phone]  If 7PM-7AM, please contact night-coverage [email or web address] Password [email or web address] 05/18/2014, 5:54 PM   LOS: 1 day   HPI/Subjective: No events overnight. Hematuria improving per family  Objective: Filed Vitals:   05/18/14 0216 05/18/14 0341 05/18/14 0617 05/18/14 1248  BP: 162/61 173/69 171/65 149/64  Pulse: 59 58 58 62  Temp: 97.5 F (36.4 C) 97.3 F (36.3 C) 97.9 F (36.6 C) 97.5 F (36.4 C)  TempSrc: Oral Oral Oral Oral  Resp: '16 16 16 16  '$ Height:      Weight:      SpO2: 100% 99%  99%    Intake/Output Summary (Last 24 hours) at 05/18/14 1754 Last data filed at 05/18/14 1500  Gross per 24 hour  Intake   3172 ml  Output  16350 ml  Net -13178 ml    Exam:   General:  Pt is somnolent but easy to awake, NAD   Cardiovascular: Paced rhythm, no rubs, no gallops  Respiratory: Clear to auscultation bilaterally, no wheezing, diminished breath sounds at bases   Abdomen: Soft, non tender, non distended, bowel sounds present, no guarding  Extremities: pulses DP and PT palpable bilaterally  Data Reviewed: Basic Metabolic Panel:  Recent Labs Lab 05/17/14 1120 05/17/14 1741 05/18/14 0402  NA 135  --  138  K 3.5  --  3.4*  CL 109  --  108  CO2 15*  --  17*  GLUCOSE 105*  --  110*  BUN 84*  --  84*  CREATININE 9.47*  --  8.44*  CALCIUM 8.3*  --  8.5*  MG  --  1.7  --    Liver Function  Tests:  Recent Labs Lab 05/18/14 0402  AST 12*  ALT 9*  ALKPHOS 51  BILITOT 0.7  PROT 6.8  ALBUMIN 2.6*   CBC:  Recent Labs Lab 05/17/14 1120 05/18/14 0402  WBC 10.8* 12.5*  HGB 10.3* 9.4*  HCT 32.0* 29.5*  MCV 88.9 88.3  PLT 381 352   Cardiac CBG:  Recent Labs Lab 05/18/14 0846  GLUCAP 124*    Scheduled Meds: . carvedilol  6.25 mg Oral BID WC  . cefTRIAXone (ROCEPHIN)  IV  1 g Intravenous Q24H  . docusate sodium  100 mg Oral BID  . feeding supplement (ENSURE ENLIVE)  237 mL Oral BID BM  . sodium bicarbonate  650 mg Oral BID   Continuous Infusions: . sodium chloride 75 mL/hr at 05/18/14 0341  . sodium chloride irrigation

## 2014-05-18 NOTE — Progress Notes (Signed)
Occupational Therapy Evaluation Patient Details Name: Justin Lang. MRN: [id number] DOB: 1922/09/15 Today's Date: 05/18/2014    History of Present Illness 81 yo male admitted with hematuria. hx of pacemaker, DVT/PE, metastatic colon cancer with periotoneal mets, ureteral stent, periphernal neuropathy, bradycardia, IVC filter. Pt is on home hospice-lives with wife.   Clinical Impression   PTA, pt on Hospice services. Wife assisting with ADL and mobility. Pt demonstrates a decline in function. Recommend wife increase available resources to help with care.     Follow Up Recommendations  Supervision/Assistance - 24 hour;Other (comment) (plan is home with Hospice)    Equipment Recommendations  Lift equipment -wife would benefit from using a "Stedy" to transfer pt   Recommendations for Other Services       Precautions / Restrictions Precautions Precautions: Fall Precaution Comments: multiple lines/drains Restrictions Weight Bearing Restrictions: No      Mobility Bed Mobility Overal bed mobility: Needs Assistance Bed Mobility: Sit to Supine     Supine to sit: Max assist     General bed mobility comments: Assist for trunk and bil LEs. Mod encourgement for participation. Utilized bedpad for scooting, positioning.   Transfers Overall transfer level: Needs assistance Equipment used: Rolling walker (2 wheeled) Transfers: Sit to/from [facility] Sit to Stand: Max assist Stand pivot transfers: Max assist       General transfer comment: significant posterior lean    Balance Overall balance assessment: Needs assistance   Sitting balance-Leahy Scale: Poor     Standing balance support: Bilateral upper extremity supported;During functional activity Standing balance-Leahy Scale: Poor                              ADL Overall ADL's : Needs assistance/impaired       Grooming Details (indicate cue type and reason): able to wash hands/face  wtih increased time Upper Body Bathing: Moderate assistance   Lower Body Bathing: Maximal assistance;Sit to/from stand   Upper Body Dressing : Moderate assistance;Sitting   Lower Body Dressing: Total assistance;Sit to/from stand   Toilet Transfer: Maximal assistance Toilet Transfer Details (indicate cue type and reason): simulated Toileting- Clothing Manipulation and Hygiene: Total assistance Toileting - Clothing Manipulation Details (indicate cue type and reason): foley     Functional mobility during ADLs: Maximal assistance General ADL Comments: wife assist s pt with ADL. Conversation with wife regarding level of care increasing as pt is weaker than his baseline. Pt onHospice care and recommended that wife use their services for bathing/dressing.. Pt was max Ato trasnfer to bed from recliner wtih significant posterior lean.                     Pertinent Vitals/Pain Pain Assessment: Faces Faces Pain Scale: Hurts a little bit Pain Location: lower abdomen Pain Descriptors / Indicators: Grimacing Pain Intervention(s): Limited activity within patient's tolerance;Monitored during session     Hand Dominance     Extremity/Trunk Assessment Upper Extremity Assessment Upper Extremity Assessment: Generalized weakness   Lower Extremity Assessment Lower Extremity Assessment: Generalized weakness   Cervical / Trunk Assessment Cervical / Trunk Assessment: Kyphotic   Communication Communication Communication: No difficulties   Cognition Arousal/Alertness: Awake/alert Behavior During Therapy: Flat affect Overall Cognitive Status: History of cognitive impairments - at baseline Area of Impairment: Orientation;Following commands;Problem solving Orientation Level: Place;Time;Situation   Memory: Decreased recall of precautions;Decreased short-term memory Following Commands: Follows one step commands with increased time  Problem Solving: Difficulty sequencing;Requires verbal  cues;Requires tactile cues;Decreased initiation     General Comments       Exercises       Shoulder Instructions      Home Living Family/patient expects to be discharged to:: Private residence Living Arrangements: Spouse/significant other Available Help at Discharge: Family;Available 24 hours/day Type of Home: House Home Access: Stairs to enter [employer] of Steps: 1+1 Entrance Stairs-Rails: Right Home Layout: Two level;Able to live on main level with bedroom/bathroom     Bathroom Shower/Tub: Occupational psychologist: Handicapped height Bathroom Accessibility: Yes How Accessible: Accessible via walker Home Equipment: [place] - single point;Walker - 2 wheels;Wheelchair - manual;Shower seat - built in;Bedside commode;Hand held shower head;Grab bars - tub/shower          Prior Functioning/Environment Level of Independence: Needs assistance  Gait / Transfers Assistance Needed: using RW with assistance-very little walking at home recently          OT Diagnosis: Generalized weakness;Cognitive deficits   OT Problem List: Decreased strength;Decreased range of motion;Decreased activity tolerance;Impaired balance (sitting and/or standing);Decreased cognition;Decreased safety awareness;Decreased knowledge of use of DME or AE   OT Treatment/Interventions: Self-care/ADL training;Therapeutic exercise;DME and/or AE instruction;Therapeutic activities;Patient/family education;Balance training    OT Goals(Current goals can be found in the care plan section) Acute Rehab OT Goals Patient Stated Goal: per wife home OT Goal Formulation: With patient/family Time For Goal Achievement: 06/01/14 Potential to Achieve Goals: Fair  OT Frequency: Min 2X/week   Barriers to D/C:            Co-evaluation              End of Session Equipment Utilized During Treatment: Gait belt Nurse Communication: Mobility status  Activity Tolerance: Patient limited by  fatigue Patient left: in bed;with call bell/phone within reach;with family/visitor present   Time: 7078-6754 OT Time Calculation (min): 17 min Charges:  OT General Charges $OT Visit: 1 Procedure OT Evaluation $Initial OT Evaluation Tier I: 1 Procedure G-Codes:    Ardeth Repetto,HILLARY 06-09-14, 3:12 PM   [facility], OTR/L  [phone] 06/09/2014

## 2014-05-18 NOTE — Progress Notes (Signed)
IP PROGRESS NOTE  Subjective:   [name] is known to me with a history of metastatic colon cancer. He is enrolled in the [facility] program. He was admitted yesterday with gross hematuria. His family report [name] has developed anorexia and a decreased activity level over the past several weeks. He denies pain. Intermittent nausea.  Objective: Vital signs in last 24 hours: Blood pressure 171/65, pulse 58, temperature 97.9 F (36.6 C), temperature source Oral, resp. rate 16, height $RemoveBe'5\' 4"'WHTshqcdz$  (1.626 m), weight 160 lb (72.576 kg), SpO2 99 %.  Intake/Output from previous day: 05/02 0701 - 05/03 0700 In: 3032 [Blood:482; IV Piggyback:100] Out: 9850 [Urine:9850]  Physical Exam:  Lungs: Clear anteriorly, no respiratory distress Cardiac: Regular rate and rhythm Abdomen: Soft, no hepatosplenomegaly, no mass. Mild tenderness in the bilateral low abdomen Extremities: No leg edema GU: Pink urine in the Foley catheter   Lab Results:  Recent Labs  05/17/14 1120 05/18/14 0402  WBC 10.8* 12.5*  HGB 10.3* 9.4*  HCT 32.0* 29.5*  PLT 381 352    BMET  Recent Labs  05/17/14 1120 05/18/14 0402  NA 135 138  K 3.5 3.4*  CL 109 108  CO2 15* 17*  GLUCOSE 105* 110*  BUN 84* 84*  CREATININE 9.47* 8.44*  CALCIUM 8.3* 8.5*   PT 19.7 seconds, PTT 58  Studies/Results: Ct Abdomen Pelvis Wo Contrast  05/17/2014   CLINICAL DATA:  Known metastatic colon carcinoma with three-day history of lower abdominal pain and diarrhea. Chronic hematuria  EXAM: CT ABDOMEN AND PELVIS WITHOUT CONTRAST  TECHNIQUE: Multidetector CT imaging of the abdomen and pelvis was performed following the standard protocol without oral or intravenous contrast material administration.  COMPARISON:  October 02, 2013 and October 14, 2011  FINDINGS: There is scarring in the lung bases. There is no lung base edema or consolidation. Pacemaker leads are attached to the right atrium and right ventricle. There are  scattered foci of coronary artery calcification. There is a focal hiatal hernia.  There is a cystic appearing lesion in the right lobe of the liver toward the dome measuring 2.1 x 1.8 cm. This lesion appears benign and has remained stable for over 2 years. There are several tiny liver lesions which are stable which may represent small cysts. In the medial segment of the left lobe of the liver, there is a new ill-defined area of decreased attenuation measuring 3.2 x 2.6 cm, best seen on axial slice 13 series 2, a finding that must be concerning for potential neoplastic focus given the change from the previous study.  Gallbladder is absent.  There is no biliary duct dilatation.  Spleen, pancreas, and adrenals appear normal.  The left kidney is atrophic. There are stable calculi in the left extrarenal pelvis, largest measuring 1.0 x 0.7 cm. No new calculi are identified on the left. The left ureter is not dilated. On the right, there is severe hydronephrosis with a double-J stent extending from the right renal pelvis to the bladder. There is no mass or calculus on the right.  There is a mass arising from the omentum in the right lower quadrant which surrounds the right ureter measuring 4.2 x 3.5 cm. This mass has increased in size compared to prior study.  In the pelvis, there is a Foley catheter in the bladder. The contour of the urinary bladder is diffusely irregular with irregular thickening most severe in posterior basilar region of the bladder. Air within the bladder is probably due to instrumentation.  Note that the distal aspect of the right double-J catheter tip is in the bladder. There is no mass outside of the bladder. There are multiple sigmoid diverticula without diverticulitis. Appendix is absent. Fat is noted in each inguinal ring.  There is no bowel obstruction. No free air or portal venous air. There is evidence of previous surgery in the left and mid abdominal regions with anastomoses of bowel patent in  these areas. There is a filter in the inferior vena cava. There is no ascites, adenopathy, or abscess in the an or pelvis. There is atherosclerotic change in the aorta and iliac arteries without aneurysm. There is extensive degenerative type change in lumbar spine. There are no blastic or lytic bone lesions.  IMPRESSION: The right lower quadrant omental metastasis surrounding and encasing the right ureter has increased in size. There is a new subtle mass in the medial segment left lobe of the liver concerning for metastatic disease within the liver. There is a smaller cyst in the right lobe of the liver which is stable.  There is severe hydronephrosis on the right with double-J stent extending from the right renal pelvis to the bladder, traversing the area of encasing metastasis in the right lower quadrant.  Left kidney is atrophic and contains several calculi in the renal pelvis region. No appreciable hydronephrosis on the left.  Irregularity in the urinary bladder. Question chronic inflammation versus tumor within the bladder. Both entities may exist concurrently. Air in the urinary bladder is probably due to the Foley catheter.  No bowel obstruction. No abscess. Moderate hiatal hernia present. Areas of postoperative change with patent bowel anastomoses. Filter noted in inferior vena cava.   Electronically Signed   By: Lowella Grip III M.D.   On: 05/17/2014 13:59    Medications: I have reviewed the patient's current medications.  Assessment/Plan: 1.Metastatic colorectal cancer, status post resection of a cecum/appendiceal mass in November 2013 (T4 N0), K-ras wild-type  -Biopsy of a peritoneal nodule 07/04/2012 confirmed metastatic colorectal cancer  -cycle 1 panitumumab 08/20/2012  -CT abdomen/pelvis 03/16/2013 without evidence of progressive colon cancer  -CT abdomen/pelvis 10/02/2013 with an increased peritoneal masses causing right ureter obstruction  -CT abdomen/pelvis 05/17/2014-increased  size of the right lower quadrant omental mass with encasement of the right ureter, new liver metastasis, severe right hydronephrosis 2. Sigmoid colon cancer in 1997, status post a partial course of adjuvant chemotherapy (? 5-FU/leucovorin), discontinued secondary to a cardiovascular event  3. Resection of a periaortic lymph node in 2002 with the pathology confirming a moderately differentiated carcinoma  4. History of a DVT/pulmonary embolism, status post IVC filter placement, maintained on Coumadin , followed at the [facility], supratherapeutic PT/INR on hospital admission 05/17/2014 5. Arrhythmia, status post pacemaker placement , followed by cardiology  6. cardiomyopathy-EF 20-25% on echocardiogram 10/02/2013  7. acute renal failure secondary to obstructive nephropathy , status post placement of a percutaneous nephrostomy tube 10/09/2013  Status post internalization of the right ureter stent on 11/19/2013  Status post right ureter stent exchange 03/17/2014 with laser lithotripsy of bladder stone  8. Acute renal failure 9. Anemia secondary to acute blood loss  [name] is admitted with gross hematuria in the setting of a supratherapeutic PT/INR. He has acute renal failure. The renal failure may be in part related to bladder outlet obstruction. Hopefully there will be some improvement with hydration and bladder irrigation.  He has progressive metastatic colon cancer. I discussed the poor prognosis with his family. His family agrees  he is not a candidate for hemodialysis. I recommend discontinuing Coumadin anticoagulation secondary to the risk of bleeding. They understand he will remain at increased risk for venous thromboembolic disease. I estimated his lifespan to be measured in days or weeks if the renal function does not improve.  Recommendations: 1. Discontinue Coumadin 2. Management of the hematuria per urology 3. Resume home Hospice care at discharge   LOS: 1 day    [place]  05/18/2014, 6:46 AM

## 2014-05-18 NOTE — Progress Notes (Signed)
[facility] [address] and [facility] RN Visit Related admission to [facility] DX Colon Cancer; pt is a DNR code status. Patient seen at bedside yesterday afternoon and again this morning. His wife is present in the room. Spoke with staff nurse, Joelene Millin who reports pt. may go home tomorrow and that his labs are better today. PT came in and assisted pt.to the chair. Pt. denies any pain and is requesting his wife to bring him cold water. His foley is intact draining very pink urine. No shortness of breath observed. Wife reports pt. has had nothing to eat today and indicated that his appetite over the last few weeks has consisted of a few spoonfuls of food daily. Discussed possible delivery of a hospital bed but pt's wife declines this at present.  Please call with any hospice questions.  [facility] Liaison [phone]

## 2014-05-18 NOTE — Evaluation (Signed)
Physical Therapy Evaluation Patient Details Name: Justin Lang. MRN: [id number] DOB: 02/17/1922 Today's Date: 05/18/2014   History of Present Illness  81 yo male admitted with hematuria. hx of pacemaker, DVT/PE, metastatic colon cancer with periotoneal mets, ureteral stent, periphernal neuropathy, bradycardia, IVC filter. Pt is on home hospice-lives with wife.  Clinical Impression  On eval, pt required Mod assist for bed mobility and Min assist to stand pivot from bed to recliner with use of walker. Mod encouragement for pt participation. Pt is weak. Discussed d/c plan with wife-plan is for pt to return home. Wife feels she can continue to provide care. Hospice rep was present and encouraged wife to arrange for more help at home as well.    Follow Up Recommendations Home health PT;Supervision/Assistance - 24 hour (not sure if HHPT is available with pt being on hospice. )    Equipment Recommendations  None recommended by PT (wife did not feel there was room for hospital bed at home. )    Recommendations for Other Services       Precautions / Restrictions Precautions Precautions: Fall Precaution Comments: multiple lines/drains Restrictions Weight Bearing Restrictions: No      Mobility  Bed Mobility Overal bed mobility: Needs Assistance Bed Mobility: Supine to Sit     Supine to sit: HOB elevated;Mod assist     General bed mobility comments: Assist for trunk and bil LEs. Mod encourgement for participation. Utilized bedpad for scooting, positioning.   Transfers Overall transfer level: Needs assistance Equipment used: Rolling walker (2 wheeled) Transfers: Sit to/from Stand Sit to Stand: Mod assist         General transfer comment: Assist to rise, stabilize, control descent, maneuver with RW. Stand pivot from bed to recliner with RW. Increased time.   Ambulation/Gait       Gait Pattern/deviations: Wide base of support;Trunk flexed        Stairs             Wheelchair Mobility    Modified Rankin (Stroke Patients Only)       Balance Overall balance assessment: Needs assistance         Standing balance support: Bilateral upper extremity supported;During functional activity Standing balance-Leahy Scale: Poor                               Pertinent Vitals/Pain Pain Assessment: No/denies pain    Home Living Family/patient expects to be discharged to:: Private residence Living Arrangements: Spouse/significant other Available Help at Discharge: Family;Available 24 hours/day Type of Home: House Home Access: Stairs to enter Entrance Stairs-Rails: Right Entrance Stairs-Number of Steps: 1+1 Home Layout: Two level;Able to live on main level with bedroom/bathroom Home Equipment: Kasandra Knudsen - single point;Walker - 2 wheels;Wheelchair - manual;Shower seat - built in;Bedside commode;Hand held shower head;Grab bars - tub/shower      Prior Function Level of Independence: Needs assistance   Gait / Transfers Assistance Needed: using RW with assistance-very little walking at home recently           Hand Dominance        Extremity/Trunk Assessment   Upper Extremity Assessment: Generalized weakness           Lower Extremity Assessment: Generalized weakness      Cervical / Trunk Assessment: Kyphotic  Communication   Communication: No difficulties  Cognition Arousal/Alertness: Awake/alert Behavior During Therapy: Flat affect Overall Cognitive Status: History of cognitive impairments - at baseline  Area of Impairment: Orientation;Following commands;Problem solving Orientation Level: Place;Time;Situation   Memory: Decreased recall of precautions;Decreased short-term memory Following Commands: Follows one step commands with increased time     Problem Solving: Difficulty sequencing;Requires verbal cues;Requires tactile cues;Decreased initiation      General Comments      Exercises        Assessment/Plan     PT Assessment Patient needs continued PT services  PT Diagnosis Difficulty walking;Generalized weakness   PT Problem List Decreased strength;Decreased activity tolerance;Decreased balance;Decreased mobility;Decreased cognition;Decreased knowledge of use of DME  PT Treatment Interventions DME instruction;Gait training;Functional mobility training;Therapeutic activities;Therapeutic exercise;Patient/family education;Balance training   PT Goals (Current goals can be found in the Care Plan section) Acute Rehab PT Goals Patient Stated Goal: none stated. per wife-home PT Goal Formulation: With family Time For Goal Achievement: 06/01/14 Potential to Achieve Goals: Fair    Frequency Min 3X/week   Barriers to discharge        Co-evaluation               End of Session   Activity Tolerance: Patient limited by fatigue Patient left: in chair;with call bell/phone within reach;with chair alarm set;with family/visitor present           Time: 5929-2446 PT Time Calculation (min) (ACUTE ONLY): 29 min   Charges:   PT Evaluation $Initial PT Evaluation Tier I: 1 Procedure PT Treatments $Therapeutic Activity: 8-22 mins   PT G Codes:        Weston Anna, MPT Pager: [phone]

## 2014-05-19 LAB — BASIC METABOLIC PANEL
ANION GAP: 8 (ref 5–15)
BUN: 78 mg/dL — AB (ref 6–20)
CALCIUM: 8 mg/dL — AB (ref 8.9–10.3)
CO2: 19 mmol/L — ABNORMAL LOW (ref 22–32)
Chloride: 111 mmol/L (ref 101–111)
Creatinine, Ser: 7.97 mg/dL — ABNORMAL HIGH (ref 0.61–1.24)
GFR calc Af Amer: 6 mL/min — ABNORMAL LOW (ref >60)
GFR calc non Af Amer: 5 mL/min — ABNORMAL LOW (ref >60)
Glucose, Bld: 157 mg/dL — ABNORMAL HIGH (ref 70–99)
Potassium: 3.7 mmol/L (ref 3.5–5.1)
Sodium: 138 mmol/L (ref 135–145)

## 2014-05-19 LAB — PREPARE FRESH FROZEN PLASMA
Unit division: 0
Unit division: 0

## 2014-05-19 LAB — GLUCOSE, CAPILLARY: GLUCOSE-CAPILLARY: 110 mg/dL — AB (ref 70–99)

## 2014-05-19 NOTE — Progress Notes (Signed)
Subjective: Patient reports   No new complaints or concerns.  Urine is clear with minimal CBI. Renal function is slowly improving.   Objective: Vital signs in last 24 hours: Temp:  [97.5 F (36.4 C)-97.9 F (36.6 C)] 97.9 F (36.6 C) (05/04 0634) Pulse Rate:  [57-62] 57 (05/04 0634) Resp:  [16] 16 (05/04 0634) BP: (149-161)/(59-64) 161/59 mmHg (05/04 0634) SpO2:  [99 %] 99 % (05/04 0634) Weight:  [71.714 kg (158 lb 1.6 oz)] 71.714 kg (158 lb 1.6 oz) (05/03 1248)  Intake/Output from previous day: 05/03 0701 - 05/04 0700 In: 240 [P.O.:240] Out: 09628 [Urine:10625] Intake/Output this shift: Total I/O In: 240 [P.O.:240] Out: 9675 [Urine:9675]  Physical Exam:  Constitutional: Vital signs reviewed. WD WN in NAD   Eyes: PERRL, No scleral icterus.   Cardiovascular: RRR Pulmonary/Chest: Normal effort Abdominal: Soft. Non-tender, non-distended, bowel sounds are normal, no masses, organomegaly, or guarding present.  Genitourinary:indwelling catheter draining clear urine Extremities: No cyanosis or edema   Lab Results:  Recent Labs  05/17/14 1120 05/18/14 0402  HGB 10.3* 9.4*  HCT 32.0* 29.5*   BMET  Recent Labs  05/18/14 0402 05/19/14 0900  NA 138 138  K 3.4* 3.7  CL 108 111  CO2 17* 19*  GLUCOSE 110* 157*  BUN 84* 78*  CREATININE 8.44* 7.97*  CALCIUM 8.5* 8.0*    Recent Labs  05/17/14 1120 05/17/14 1825 05/18/14 0402  INR >10.00* 4.38* 1.65*   No results for input(s): LABURIN in the last 72 hours. Results for orders placed or performed during the hospital encounter of 05/17/14  Urine culture     Status: None   Collection Time: 05/17/14 11:00 AM  Result Value Ref Range Status   Specimen Description URINE, CLEAN CATCH  Final   Special Requests NONE  Final   Colony Count   Final    70,000 COLONIES/ML Performed at [facility]    Culture   Final    Multiple bacterial morphotypes present, none predominant. Suggest appropriate recollection if  clinically indicated. Performed at [facility]    Report Status 05/18/2014 FINAL  Final    Studies/Results: Ct Abdomen Pelvis Wo Contrast  05/17/2014   CLINICAL DATA:  Known metastatic colon carcinoma with three-day history of lower abdominal pain and diarrhea. Chronic hematuria  EXAM: CT ABDOMEN AND PELVIS WITHOUT CONTRAST  TECHNIQUE: Multidetector CT imaging of the abdomen and pelvis was performed following the standard protocol without oral or intravenous contrast material administration.  COMPARISON:  October 02, 2013 and October 14, 2011  FINDINGS: There is scarring in the lung bases. There is no lung base edema or consolidation. Pacemaker leads are attached to the right atrium and right ventricle. There are scattered foci of coronary artery calcification. There is a focal hiatal hernia.  There is a cystic appearing lesion in the right lobe of the liver toward the dome measuring 2.1 x 1.8 cm. This lesion appears benign and has remained stable for over 2 years. There are several tiny liver lesions which are stable which may represent small cysts. In the medial segment of the left lobe of the liver, there is a new ill-defined area of decreased attenuation measuring 3.2 x 2.6 cm, best seen on axial slice 13 series 2, a finding that must be concerning for potential neoplastic focus given the change from the previous study.  Gallbladder is absent.  There is no biliary duct dilatation.  Spleen, pancreas, and adrenals appear normal.  The left kidney is atrophic. There  are stable calculi in the left extrarenal pelvis, largest measuring 1.0 x 0.7 cm. No new calculi are identified on the left. The left ureter is not dilated. On the right, there is severe hydronephrosis with a double-J stent extending from the right renal pelvis to the bladder. There is no mass or calculus on the right.  There is a mass arising from the omentum in the right lower quadrant which surrounds the right ureter measuring 4.2  x 3.5 cm. This mass has increased in size compared to prior study.  In the pelvis, there is a Foley catheter in the bladder. The contour of the urinary bladder is diffusely irregular with irregular thickening most severe in posterior basilar region of the bladder. Air within the bladder is probably due to instrumentation. Note that the distal aspect of the right double-J catheter tip is in the bladder. There is no mass outside of the bladder. There are multiple sigmoid diverticula without diverticulitis. Appendix is absent. Fat is noted in each inguinal ring.  There is no bowel obstruction. No free air or portal venous air. There is evidence of previous surgery in the left and mid abdominal regions with anastomoses of bowel patent in these areas. There is a filter in the inferior vena cava. There is no ascites, adenopathy, or abscess in the an or pelvis. There is atherosclerotic change in the aorta and iliac arteries without aneurysm. There is extensive degenerative type change in lumbar spine. There are no blastic or lytic bone lesions.  IMPRESSION: The right lower quadrant omental metastasis surrounding and encasing the right ureter has increased in size. There is a new subtle mass in the medial segment left lobe of the liver concerning for metastatic disease within the liver. There is a smaller cyst in the right lobe of the liver which is stable.  There is severe hydronephrosis on the right with double-J stent extending from the right renal pelvis to the bladder, traversing the area of encasing metastasis in the right lower quadrant.  Left kidney is atrophic and contains several calculi in the renal pelvis region. No appreciable hydronephrosis on the left.  Irregularity in the urinary bladder. Question chronic inflammation versus tumor within the bladder. Both entities may exist concurrently. Air in the urinary bladder is probably due to the Foley catheter.  No bowel obstruction. No abscess. Moderate hiatal  hernia present. Areas of postoperative change with patent bowel anastomoses. Filter noted in inferior vena cava.   Electronically Signed   By: Lowella Grip III M.D.   On: 05/17/2014 13:59    Assessment/Plan:   Gross hematuria now resolved.  Ongoing chronic renal failure most likely multifactorial.  Stent appears well positioned but often there is poor drainage through ureteral stents in the face of malignant obstruction.  In this situation however I would not recommend percutaneous nephrostomy tube.  Will continue to concentrate on palliative care.  If urine remains clear we'll consider voiding trial in the morning.  We'll stop continuous bladder irrigation   LOS: 2 days   Sherrie Marsan S 05/19/2014, 12:03 PM

## 2014-05-19 NOTE — Progress Notes (Signed)
[facility]ospice and [facility]) Chaplain Visit: Pt awake in bed, IVs in place, relaxed and comfortable, recognizing chaplain, pleasantly interactive.  Wife Bethena Roys present in room with daughter-in-law Lattie Haw, along with two college aged granddaughters Jerene Pitch and [place], all open to encouragement and faith affirmation.  Wife feels pt is making progress for probable d/c home in the AM.  Pt had little appetite for lunch.  Shared about family blessings.  Wife having to reschedule her own doctor's appointments, but managing with change of plans.  Church continues active support. Carlus Pavlov, ThM, [place]

## 2014-05-19 NOTE — Progress Notes (Signed)
[facility] PROGRESS NOTE  Justin Lang. HER:[id number] DOB: May 26, 1922 DOA: 05/17/2014 PCP: Delphina Cahill, MD  HPI Pt is 81 yo male with known DVT/PE on chronic anticoagulation and status post IVC filter placement, metastatic colorectal cancer status post resection of cecum/appendiceal mass November 2013 (T4 N0) status post adjuvant chemotherapy, obstructive nephropathy status post placement of percutaneous nephrostomy tube 10/09/2013 with internalization of right urethral stent 11/19/2013 which was changed recently in March 2016 and at that time bladder stone was treated, on home hospice, history of arrhythmia status post pacemaker placement, cardiomyopathy with a EF of 20-25% per 2-D echo of 10/02/2013, history of bladder outlet obstruction secondary to enlarged prostate who presented to the ED with a 1-2 day history of progressively worsening hematuria, dysuria and suprapubic abdominal pain.   Subjective - No chest pain, dyspnea - denies abdominal pain, nausea/vomiting  Assessment / Plan  Hematuria, gross - in the setting of supra therapeutic INR - appreciate urology team following, patient needed CBI, clear today, Urology will stop - voiding trial in am   UTI - continue Rocephin day #3/7 - urine culture negative - given symptoms on presentation will go ahead and complete the course  Acute renal failure superimposed on stage 4 chronic kidney disease - likely pre renal etiology imposed on CKD - Cr trending down  - please note no need for nephrology consultation as pt's wishes are clear for no interventions including HD - Cr slowly improving however with significant elevation   Progressive metastatic colon cancer - appreciate Dr. Benay Spice assistance - his recommendation is stop Coumadin due to risk of bleeding even though he will remain high risk for thromboembolic disease - family fully understanding and agree with plan - d/c home with hospice when cleared by Urology     History of DVT and PE - d/c Coumadin as noted above    Acute blood loss anemia imposed on ACD, CKD, IDA, malignancy - Hg down over the past 24 hours but no indication for transfusion at this time  - will repeat BMP in AM   Hypokalemia - will supplement and repeat BMP in AM  Metabolic acidosis - secondary to the acute illness - encourage PO intake if pt able to tolerate - continue sodium bicarb supplementation  - continue IVF but add KCl to IVF and repeat BMP in AM  Chronic Severe combined systolic and diastolic CHF, EF 85% with grade II diastolic CHF - difficult medical condition to manage in the setting of renal failure and requirement of IVF  - weight is 72 kg this AM, will monitor daily weights, I/O  Pacemaker  Severe PCM - allow comfort feeding as pt able to tolerate   DVT prophylaxis - SCD's  Code Status: DNR Family Communication: Discussed with wife at bedside  Disposition Plan: Not ready for d/c at this time, voiding trail tomorrow morning  Procedures and diagnostic studies: Ct Abdomen Pelvis Wo Contrast 05/17/2014  The right lower quadrant omental metastasis surrounding and encasing the right ureter has increased in size. There is a new subtle mass in the medial segment left lobe of the liver concerning for metastatic disease within the liver. There is a smaller cyst in the right lobe of the liver which is stable.  There is severe hydronephrosis on the right with double-J stent extending from the right renal pelvis to the bladder, traversing the area of encasing metastasis in the right lower quadrant.  Left kidney is atrophic and contains several calculi in the  renal pelvis region. No appreciable hydronephrosis on the left.  Irregularity in the urinary bladder. Question chronic inflammation versus tumor within the bladder. Both entities may exist concurrently. Air in the urinary bladder is probably due to the Foley catheter.  No bowel obstruction. No abscess. Moderate hiatal  hernia present. Areas of postoperative change with patent bowel anastomoses. Filter noted in inferior vena cava.     Consultations Urology Oncologist Dr. Benay Spice   Antibiotics  Rocephin 5/2 -->   Objective: Filed Vitals:   05/18/14 0617 05/18/14 1248 05/19/14 0634 05/19/14 1500  BP: 171/65 149/64 161/59 154/54  Pulse: 58 62 57 62  Temp: 97.9 F (36.6 C) 97.5 F (36.4 C) 97.9 F (36.6 C) 98.2 F (36.8 C)  TempSrc: Oral Oral Oral Oral  Resp: '16 16 16 16  '$ Height:      Weight:  71.714 kg (158 lb 1.6 oz)    SpO2:  99% 99% 100%    Intake/Output Summary (Last 24 hours) at 05/19/14 1724 Last data filed at 05/19/14 1357  Gross per 24 hour  Intake    240 ml  Output  13900 ml  Net -13660 ml   Exam:  General:  NAD   Cardiovascular: Paced rhythm, no rubs, no gallops  Respiratory: Clear to auscultation bilaterally, no wheezing, diminished breath sounds at bases   Abdomen: Soft, non tender, non distended, bowel sounds present, no guarding  Extremities: pulses DP and PT palpable bilaterally  Data Reviewed: Basic Metabolic Panel:  Recent Labs Lab 05/17/14 1120 05/17/14 1741 05/18/14 0402 05/19/14 0900  NA 135  --  138 138  K 3.5  --  3.4* 3.7  CL 109  --  108 111  CO2 15*  --  17* 19*  GLUCOSE 105*  --  110* 157*  BUN 84*  --  84* 78*  CREATININE 9.47*  --  8.44* 7.97*  CALCIUM 8.3*  --  8.5* 8.0*  MG  --  1.7  --   --    Liver Function Tests:  Recent Labs Lab 05/18/14 0402  AST 12*  ALT 9*  ALKPHOS 51  BILITOT 0.7  PROT 6.8  ALBUMIN 2.6*   CBC:  Recent Labs Lab 05/17/14 1120 05/18/14 0402  WBC 10.8* 12.5*  HGB 10.3* 9.4*  HCT 32.0* 29.5*  MCV 88.9 88.3  PLT 381 352   Cardiac CBG:  Recent Labs Lab 05/18/14 0846 05/19/14 0731  GLUCAP 124* 110*    Scheduled Meds: . carvedilol  6.25 mg Oral BID WC  . cefTRIAXone (ROCEPHIN)  IV  1 g Intravenous Q24H  . docusate sodium  100 mg Oral BID  . feeding supplement (ENSURE ENLIVE)  237 mL Oral  BID BM  . sodium bicarbonate  650 mg Oral BID   Continuous Infusions: . 0.9 % NaCl with KCl 40 mEq / L 50 mL/hr (05/18/14 1835)  . sodium chloride irrigation      Justin Bainter M. Cruzita Lederer, MD [facility] [phone]  If 7PM-7AM, please contact night-coverage [email or web address] Password [email or web address]

## 2014-05-19 NOTE — Progress Notes (Signed)
Inpatient [facility] Rm 3 [address] [place] [facility] RN Visit Related admission to [facility] DX Colon Cancer; pt is a DNR code status. Patient seen at the bedside lying in his  Bed,  with his wife and two other family members present. Pt. awake and alert. Pt's color is pale. He answers questions appropriately. His wife reports he had a milkshake last night and sausage with a few bites of egg and biscuit today. Foley is draining clear urine and bladder irrigation continues. [facility] will continue to monitor patient daily. Per discussion with his wife she thinks he can go home by car at discharge as long as she has help at home to get him into the house.  Please call with any hospice questions.  [facility] Liaison [phone]

## 2014-05-19 NOTE — Care Management Note (Signed)
Case Management Note  Patient Details  Name: Justin Lang. MRN: [id number] Date of Birth: [birth year]  Subjective/Objective:       81 yo admitted with Gross Hematuria             Action/Plan: Pt is from home with spouse and is under the care of Hospice and [place].  Expected Discharge Date:   (unknown)    05/21/14           Expected Discharge Plan:  Home w Hospice Care  In-House Referral:     Discharge planning Services  CM Consult  Post Acute Care Choice:    Choice offered to:     DME Arranged:    DME Agency:     HH Arranged:  Disease Management [place] Agency:  [facility]  Status of Service:  In process, will continue to follow  Medicare Important Message Given:  No Date Medicare IM Given:    Medicare IM give by:    Date Additional Medicare IM Given:    Additional Medicare Important Message give by:     If discussed at [place] of Stay Meetings, dates discussed:    Additional Comments: Chart reviewed and CM following for DC needs.  Lynnell Catalan, RN 05/19/2014, 2:59 PM

## 2014-05-20 DIAGNOSIS — Z85038 Personal history of other malignant neoplasm of large intestine: Secondary | ICD-10-CM

## 2014-05-20 LAB — BASIC METABOLIC PANEL WITH GFR
Anion gap: 10 (ref 5–15)
BUN: 74 mg/dL — ABNORMAL HIGH (ref 6–20)
CO2: 17 mmol/L — ABNORMAL LOW (ref 22–32)
Calcium: 8.3 mg/dL — ABNORMAL LOW (ref 8.9–10.3)
Chloride: 113 mmol/L — ABNORMAL HIGH (ref 101–111)
Creatinine, Ser: 7.13 mg/dL — ABNORMAL HIGH (ref 0.61–1.24)
GFR calc Af Amer: 7 mL/min — ABNORMAL LOW
GFR calc non Af Amer: 6 mL/min — ABNORMAL LOW
Glucose, Bld: 96 mg/dL (ref 70–99)
Potassium: 4.1 mmol/L (ref 3.5–5.1)
Sodium: 140 mmol/L (ref 135–145)

## 2014-05-20 LAB — CBC
HCT: 28.5 % — ABNORMAL LOW (ref 39.0–52.0)
Hemoglobin: 8.9 g/dL — ABNORMAL LOW (ref 13.0–17.0)
MCH: 28 pg (ref 26.0–34.0)
MCHC: 31.2 g/dL (ref 30.0–36.0)
MCV: 89.6 fL (ref 78.0–100.0)
PLATELETS: 354 10*3/uL (ref 150–400)
RBC: 3.18 MIL/uL — ABNORMAL LOW (ref 4.22–5.81)
RDW: 15 % (ref 11.5–15.5)
WBC: 11.6 10*3/uL — ABNORMAL HIGH (ref 4.0–10.5)

## 2014-05-20 MED ORDER — SORBITOL 70 % SOLN: 30.0000 mL | Freq: Every day | Status: AC | PRN

## 2014-05-20 NOTE — Discharge Instructions (Signed)
Follow with hospice services.  Please note You were cared for by a hospitalist during your hospital stay. If you have any questions about your discharge medications or the care you received while you were in the hospital after you are discharged, you can call the unit and asked to speak with the hospitalist on call if the hospitalist that took care of you is not available. Once you are discharged, your primary care physician will handle any further medical issues. Please note that NO REFILLS for any discharge medications will be authorized once you are discharged, as it is imperative that you return to your primary care physician (or establish a relationship with a primary care physician if you do not have one) for your aftercare needs so that they can reassess your need for medications and monitor your lab values.  You can reach the hospitalist office at phone [phone] or fax [phone]   If you do not have a primary care physician, you can call [phone] for a physician referral.  Activity: As tolerated with Full fall precautions use walker/cane & assistance as needed  Diet: as tolerated  Disposition Home with hospice

## 2014-05-20 NOTE — Progress Notes (Signed)
PT Cancellation Note  Patient Details Name: Justin Lang. MRN: [id number] DOB: 06-03-1922   Cancelled Treatment:    Reason Eval/Treat Not Completed: pt and wife decline to participate with therapy on today. Wife states plan is for d/c home later today.    Weston Anna, MPT Pager: [phone]

## 2014-05-20 NOTE — Discharge Summary (Signed)
Physician Discharge Summary  Justin Lang. QMG:[id number] DOB: [birth year] DOA: 05/17/2014  PCP: Delphina Cahill, MD  Admit date: 05/17/2014 Discharge date: 05/20/2014  Time spent: > 30 minutes  Recommendations for Outpatient Follow-up:  1. Discharge home with hospice 2. Follow up with Dr. Benay Spice as needed 3. Follow up with Dr. Risa Grill as needed  Discharge Diagnoses:  Principal Problem:   Hematuria, gross Active Problems:   Memory loss   History of malignant neoplasm of large intestine   Pacemaker   Anticoagulant long-term use   Sinoatrial node dysfunction   DVT (deep venous thrombosis), unspecified laterality   Metastatic cancer   Acute blood loss anemia   Acute renal failure superimposed on stage 4 chronic kidney disease   Supratherapeutic INR   Dehydration   Nephrolithiasis   Acute UTI   Acidosis, metabolic   DVT (deep venous thrombosis)   Metastasis from colon cancer   Warfarin-induced coagulopathy  Discharge Condition: home with hospice  Diet recommendation: as tolerated   Filed Weights   05/17/14 1602 05/18/14 1248 05/20/14 0520  Weight: 72.576 kg (160 lb) 71.714 kg (158 lb 1.6 oz) 75.887 kg (167 lb 4.8 oz)   History of present illness:  With history of DVT/PE on chronic anticoagulation status post IVC filter placement, metastatic colorectal cancer status post resection of cecum/appendiceal mass November 2013 (T4 N0) status post adjuvant chemotherapy, obstructive nephropathy status post placement of percutaneous nephrostomy tube 10/09/2013 with internalization of right urethral stent 11/19/2013 which was changed recently in March 2016 and at that time bladder stone was treated, on home hospice, history of arrhythmia status post pacemaker placement, cardiomyopathy with a EF of 20-25% per 2-D echo of 10/02/2013, history of bladder outlet obstruction secondary to enlarged prostate who presents to the ED with a 1-2 day history of hematuria which has been worsening.  Patient with complaints of dysuria and suprapubic abdominal pain. Patient also endorses chronic loose stools patient denies any fever, no chills, no nausea, no vomiting, no chest pain, no shortness of breath, no cough, no melena, no hematemesis, no hematochezia, no weakness. Patient was seen in the emergency room, basic metabolic profile had a BUN of 84 bicarbonate of 15 creatinine of 9.74. CBC had a white count of 10.8 hemoglobin of 10.3 otherwise was within normal limits. INR was greater than 10. Urinalysis was turbid with large bilirubin many bacteria large leukocytes nitrite positive graded and 300 proteinuria, too numerous to count RBCs. [facility] were called to admit the patient for further evaluation and management.  Hospital Course:  Hematuria, gross - in the setting of supra therapeutic INR, urology was consulted and have followed patient while hospitalized. His coumadin was discontinue and patient needed CBIs. His urine cleared and his FOley discontinued, was able to void on his own prior to discharge.  UTI - received 4 days of Rocephin, urine cultures negative Acute renal failure superimposed on stage 4 chronic kidney disease- likely pre renal etiology imposed on CKD, Cr trending down on discharge however with still significant elevation. Very poor short term prognosis, will be home with hospice.  Progressive metastatic colon cancer- appreciate Dr. Benay Spice assistance, his recommendation is stop Coumadin due to risk of bleeding even though he will remain high risk for thromboembolic disease. Family fully understanding and agree with plan.  History of DVT and PE - d/c Coumadin as noted above Metabolic acidosis - in the setting of renal failure Chronic Severe combined systolic and diastolic CHF, EF 32% with grade II diastolic  CHF  Pacemaker Severe PCM - allow comfort feeding as pt able to tolerate   Procedures:  None    Consultations:  Urology  Oncology  Discharge  Exam: Filed Vitals:   05/19/14 0634 05/19/14 1500 05/19/14 2235 05/20/14 0520  BP: 161/59 154/54 161/57 178/66  Pulse: 57 62 60 61  Temp: 97.9 F (36.6 C) 98.2 F (36.8 C) 98.1 F (36.7 C) 97.7 F (36.5 C)  TempSrc: Oral Oral Oral Oral  Resp: '16 16 20 20  '$ Height:      Weight:    75.887 kg (167 lb 4.8 oz)  SpO2: 99% 100% 99% 96%   General: NAD, mild confusion Cardiovascular: RRR, no rubs Respiratory: CTA biL, shallow respirations  Discharge Instructions    Medication List    STOP taking these medications        warfarin 5 MG tablet  Commonly known as:  COUMADIN      TAKE these medications        carvedilol 6.25 MG tablet  Commonly known as:  COREG  Take 6.25 mg by mouth 2 (two) times daily with a meal.     feeding supplement (ENSURE COMPLETE) Liqd  Take 237 mLs by mouth 2 (two) times daily between meals.     fluticasone 0.05 % cream  Commonly known as:  CUTIVATE  Apply 1 application topically daily as needed (Applied to face when needed for irritation).     haloperidol 0.5 MG tablet  Commonly known as:  HALDOL  Take 1 tablet (0.5 mg total) by mouth every 12 (twelve) hours as needed for agitation.     loperamide 2 MG capsule  Commonly known as:  IMODIUM  Take 2 mg by mouth 3 (three) times daily as needed for diarrhea or loose stools (diarrhea).     morphine 20 MG/ML concentrated solution  Commonly known as:  ROXANOL  Take 0.25-0.5 mLs (5-10 mg total) by mouth every 2 (two) hours as needed for severe pain.     sorbitol 70 % Soln  Take 30 mLs by mouth daily as needed for moderate constipation.           Follow-up Information    Follow up with Follow up with hospice services.      The results of significant diagnostics from this hospitalization (including imaging, microbiology, ancillary and laboratory) are listed below for reference.    Significant Diagnostic Studies: Ct Abdomen Pelvis Wo Contrast  05/17/2014   CLINICAL DATA:  Known metastatic colon  carcinoma with three-day history of lower abdominal pain and diarrhea. Chronic hematuria  EXAM: CT ABDOMEN AND PELVIS WITHOUT CONTRAST  TECHNIQUE: Multidetector CT imaging of the abdomen and pelvis was performed following the standard protocol without oral or intravenous contrast material administration.  COMPARISON:  October 02, 2013 and October 14, 2011  FINDINGS: There is scarring in the lung bases. There is no lung base edema or consolidation. Pacemaker leads are attached to the right atrium and right ventricle. There are scattered foci of coronary artery calcification. There is a focal hiatal hernia.  There is a cystic appearing lesion in the right lobe of the liver toward the dome measuring 2.1 x 1.8 cm. This lesion appears benign and has remained stable for over 2 years. There are several tiny liver lesions which are stable which may represent small cysts. In the medial segment of the left lobe of the liver, there is a new ill-defined area of decreased attenuation measuring 3.2 x 2.6 cm, best seen on axial  slice 13 series 2, a finding that must be concerning for potential neoplastic focus given the change from the previous study.  Gallbladder is absent.  There is no biliary duct dilatation.  Spleen, pancreas, and adrenals appear normal.  The left kidney is atrophic. There are stable calculi in the left extrarenal pelvis, largest measuring 1.0 x 0.7 cm. No new calculi are identified on the left. The left ureter is not dilated. On the right, there is severe hydronephrosis with a double-J stent extending from the right renal pelvis to the bladder. There is no mass or calculus on the right.  There is a mass arising from the omentum in the right lower quadrant which surrounds the right ureter measuring 4.2 x 3.5 cm. This mass has increased in size compared to prior study.  In the pelvis, there is a Foley catheter in the bladder. The contour of the urinary bladder is diffusely irregular with irregular  thickening most severe in posterior basilar region of the bladder. Air within the bladder is probably due to instrumentation. Note that the distal aspect of the right double-J catheter tip is in the bladder. There is no mass outside of the bladder. There are multiple sigmoid diverticula without diverticulitis. Appendix is absent. Fat is noted in each inguinal ring.  There is no bowel obstruction. No free air or portal venous air. There is evidence of previous surgery in the left and mid abdominal regions with anastomoses of bowel patent in these areas. There is a filter in the inferior vena cava. There is no ascites, adenopathy, or abscess in the an or pelvis. There is atherosclerotic change in the aorta and iliac arteries without aneurysm. There is extensive degenerative type change in lumbar spine. There are no blastic or lytic bone lesions.  IMPRESSION: The right lower quadrant omental metastasis surrounding and encasing the right ureter has increased in size. There is a new subtle mass in the medial segment left lobe of the liver concerning for metastatic disease within the liver. There is a smaller cyst in the right lobe of the liver which is stable.  There is severe hydronephrosis on the right with double-J stent extending from the right renal pelvis to the bladder, traversing the area of encasing metastasis in the right lower quadrant.  Left kidney is atrophic and contains several calculi in the renal pelvis region. No appreciable hydronephrosis on the left.  Irregularity in the urinary bladder. Question chronic inflammation versus tumor within the bladder. Both entities may exist concurrently. Air in the urinary bladder is probably due to the Foley catheter.  No bowel obstruction. No abscess. Moderate hiatal hernia present. Areas of postoperative change with patent bowel anastomoses. Filter noted in inferior vena cava.   Electronically Signed   By: Lowella Grip III M.D.   On: 05/17/2014 13:59     Microbiology: Recent Results (from the past 240 hour(s))  Urine culture     Status: None   Collection Time: 05/17/14 11:00 AM  Result Value Ref Range Status   Specimen Description URINE, CLEAN CATCH  Final   Special Requests NONE  Final   Colony Count   Final    70,000 COLONIES/ML Performed at [facility]    Culture   Final    Multiple bacterial morphotypes present, none predominant. Suggest appropriate recollection if clinically indicated. Performed at [facility]    Report Status 05/18/2014 FINAL  Final     Labs: Basic Metabolic Panel:  Recent Labs Lab 05/17/14 1120 05/17/14  1741 05/18/14 0402 05/19/14 0900 05/20/14 0415  NA 135  --  138 138 140  K 3.5  --  3.4* 3.7 4.1  CL 109  --  108 111 113*  CO2 15*  --  17* 19* 17*  GLUCOSE 105*  --  110* 157* 96  BUN 84*  --  84* 78* 74*  CREATININE 9.47*  --  8.44* 7.97* 7.13*  CALCIUM 8.3*  --  8.5* 8.0* 8.3*  MG  --  1.7  --   --   --    Liver Function Tests:  Recent Labs Lab 05/18/14 0402  AST 12*  ALT 9*  ALKPHOS 51  BILITOT 0.7  PROT 6.8  ALBUMIN 2.6*   No results for input(s): LIPASE, AMYLASE in the last 168 hours. No results for input(s): AMMONIA in the last 168 hours. CBC:  Recent Labs Lab 05/17/14 1120 05/18/14 0402 05/20/14 0415  WBC 10.8* 12.5* 11.6*  HGB 10.3* 9.4* 8.9*  HCT 32.0* 29.5* 28.5*  MCV 88.9 88.3 89.6  PLT 381 352 354   ProBNP (last 3 results)  Recent Labs  10/01/13 1845  PROBNP 17357.0*    CBG:  Recent Labs Lab 05/18/14 0846 05/19/14 0731  GLUCAP 124* 110*    Signed:  Lache Dagher  [facility] 05/20/2014, 3:34 PM

## 2014-05-20 NOTE — Progress Notes (Signed)
Pt discharged home with wife in stable condition. Discharge instructions and scripts given. Pt and wife verbalize understanding.

## 2014-05-20 NOTE — Progress Notes (Signed)
Foley cath removed with 100 cc's of slightly pink urine in bag. Will cont to monitor patient.

## 2014-05-20 NOTE — Progress Notes (Signed)
Pt having pink urine, no clots noted in foley. Will cont to monitor.

## 2014-05-20 NOTE — Progress Notes (Signed)
Inpatient [facility] Rm [address] [place] and [facility] RN Visit Related admission to [facility] DX Colon Cancer; pt is a DNR code status. Patient lying in his bed sleeping on and off. His wife, Bethena Roys is at the bedside. Pt's wife reports he was able to void in the urinal without problems this morning. She reports his MD is discharging him home today. She wishes to drive him home. Pt's wife was very emotional about the pr's prognosis. She was reassured and advised [facility] will assist in making him comfortable. His primary nurse is to visit tomorrow at his home for follow up.  Please call with any hospice questions.  [facility] Liaison [phone]

## 2014-05-21 ENCOUNTER — Telehealth: Payer: Self-pay | Admitting: *Deleted

## 2014-05-21 NOTE — Telephone Encounter (Signed)
Called Justin Lang, she reports pt has gotten up to restroom once today. Stated she knows he "is getting worse." Support given, encouraged her to call office or hospice as needs arise. She stated she would.

## 2014-05-24 ENCOUNTER — Telehealth: Payer: Self-pay

## 2014-05-24 NOTE — Telephone Encounter (Signed)
Hospice nurse called to update on pt. He was released from hospital on 5/5 with penile bleeding. He continues to have occassional bleeding with clots. He continues to decline. He is on palliative and hospice care. Nurses call back if we need information is [phone]

## 2014-06-11 ENCOUNTER — Telehealth: Payer: Self-pay | Admitting: *Deleted

## 2014-06-11 DIAGNOSIS — C189 Malignant neoplasm of colon, unspecified: Secondary | ICD-10-CM

## 2014-06-11 MED ORDER — BISACODYL 10 MG RE SUPP: 10.0000 mg | RECTAL | Status: AC | PRN

## 2014-06-11 NOTE — Telephone Encounter (Signed)
Janne Napoleon RN with Hospice called reporting "patient is end of life, not doing good.  Has not had a bm in three days.  They will add dulcolax 10 mg suppositories and want to alert [place] to update MAR."

## 2014-06-18 ENCOUNTER — Telehealth: Payer: Self-pay | Admitting: *Deleted

## 2014-06-18 NOTE — Telephone Encounter (Signed)
Received message from [place] RN to cancel pt's appointment next week. She reports pt is at end of life, bedbound. Called pt's wife to offer support. She reports she is coping well, denies any needs at this time.

## 2014-06-22 ENCOUNTER — Ambulatory Visit: Admitting: Oncology

## 2014-06-22 ENCOUNTER — Other Ambulatory Visit

## 2014-06-25 ENCOUNTER — Telehealth: Payer: Self-pay | Admitting: Oncology

## 2014-07-20 ENCOUNTER — Encounter (HOSPITAL_COMMUNITY): Payer: Self-pay | Admitting: Emergency Medicine

## 2014-07-20 ENCOUNTER — Inpatient Hospital Stay (HOSPITAL_COMMUNITY)
Admission: EM | Admit: 2014-07-20 | Discharge: 2014-08-16 | DRG: 378 | Disposition: E | Attending: Internal Medicine | Admitting: Internal Medicine

## 2014-07-20 DIAGNOSIS — Z66 Do not resuscitate: Secondary | ICD-10-CM | POA: Diagnosis present

## 2014-07-20 DIAGNOSIS — M1990 Unspecified osteoarthritis, unspecified site: Secondary | ICD-10-CM | POA: Diagnosis present

## 2014-07-20 DIAGNOSIS — Z85038 Personal history of other malignant neoplasm of large intestine: Secondary | ICD-10-CM

## 2014-07-20 DIAGNOSIS — Z950 Presence of cardiac pacemaker: Secondary | ICD-10-CM

## 2014-07-20 DIAGNOSIS — Z87442 Personal history of urinary calculi: Secondary | ICD-10-CM

## 2014-07-20 DIAGNOSIS — Z86718 Personal history of other venous thrombosis and embolism: Secondary | ICD-10-CM

## 2014-07-20 DIAGNOSIS — G629 Polyneuropathy, unspecified: Secondary | ICD-10-CM | POA: Diagnosis present

## 2014-07-20 DIAGNOSIS — Z888 Allergy status to other drugs, medicaments and biological substances status: Secondary | ICD-10-CM

## 2014-07-20 DIAGNOSIS — I429 Cardiomyopathy, unspecified: Secondary | ICD-10-CM | POA: Diagnosis present

## 2014-07-20 DIAGNOSIS — N3281 Overactive bladder: Secondary | ICD-10-CM | POA: Diagnosis present

## 2014-07-20 DIAGNOSIS — R0902 Hypoxemia: Secondary | ICD-10-CM | POA: Diagnosis present

## 2014-07-20 DIAGNOSIS — R571 Hypovolemic shock: Secondary | ICD-10-CM | POA: Diagnosis present

## 2014-07-20 DIAGNOSIS — F419 Anxiety disorder, unspecified: Secondary | ICD-10-CM | POA: Diagnosis present

## 2014-07-20 DIAGNOSIS — Z86711 Personal history of pulmonary embolism: Secondary | ICD-10-CM

## 2014-07-20 DIAGNOSIS — Z79899 Other long term (current) drug therapy: Secondary | ICD-10-CM | POA: Diagnosis not present

## 2014-07-20 DIAGNOSIS — C189 Malignant neoplasm of colon, unspecified: Secondary | ICD-10-CM | POA: Diagnosis present

## 2014-07-20 DIAGNOSIS — R319 Hematuria, unspecified: Secondary | ICD-10-CM | POA: Diagnosis present

## 2014-07-20 DIAGNOSIS — N401 Enlarged prostate with lower urinary tract symptoms: Secondary | ICD-10-CM | POA: Diagnosis present

## 2014-07-20 DIAGNOSIS — I959 Hypotension, unspecified: Secondary | ICD-10-CM

## 2014-07-20 DIAGNOSIS — Z809 Family history of malignant neoplasm, unspecified: Secondary | ICD-10-CM

## 2014-07-20 DIAGNOSIS — K625 Hemorrhage of anus and rectum: Secondary | ICD-10-CM | POA: Diagnosis present

## 2014-07-20 DIAGNOSIS — Z9841 Cataract extraction status, right eye: Secondary | ICD-10-CM

## 2014-07-20 DIAGNOSIS — Z79891 Long term (current) use of opiate analgesic: Secondary | ICD-10-CM | POA: Diagnosis not present

## 2014-07-20 DIAGNOSIS — Z886 Allergy status to analgesic agent status: Secondary | ICD-10-CM | POA: Diagnosis not present

## 2014-07-20 DIAGNOSIS — Z515 Encounter for palliative care: Secondary | ICD-10-CM | POA: Diagnosis not present

## 2014-07-20 DIAGNOSIS — K922 Gastrointestinal hemorrhage, unspecified: Secondary | ICD-10-CM | POA: Diagnosis present

## 2014-07-20 DIAGNOSIS — I82409 Acute embolism and thrombosis of unspecified deep veins of unspecified lower extremity: Secondary | ICD-10-CM | POA: Diagnosis present

## 2014-07-20 DIAGNOSIS — C799 Secondary malignant neoplasm of unspecified site: Secondary | ICD-10-CM

## 2014-07-20 DIAGNOSIS — Z791 Long term (current) use of non-steroidal anti-inflammatories (NSAID): Secondary | ICD-10-CM | POA: Diagnosis not present

## 2014-07-20 DIAGNOSIS — K921 Melena: Secondary | ICD-10-CM

## 2014-07-20 DIAGNOSIS — Z9842 Cataract extraction status, left eye: Secondary | ICD-10-CM

## 2014-07-20 MED ORDER — HALOPERIDOL LACTATE 5 MG/ML IJ SOLN: 0.5000 mg | INTRAMUSCULAR | Status: DC | PRN

## 2014-07-20 MED ORDER — SODIUM CHLORIDE 0.9 % IV SOLN: INTRAVENOUS | Status: DC

## 2014-07-20 MED ORDER — ACETAMINOPHEN 650 MG RE SUPP: 650.0000 mg | Freq: Four times a day (QID) | RECTAL | Status: DC | PRN

## 2014-07-20 MED ORDER — HALOPERIDOL 0.5 MG PO TABS
0.5000 mg | ORAL_TABLET | ORAL | Status: DC | PRN
  Filled 2014-07-20: qty 1

## 2014-07-20 MED ORDER — MORPHINE SULFATE 4 MG/ML IJ SOLN
4.0000 mg | INTRAMUSCULAR | Status: DC | PRN
Start: 2014-07-20 — End: 2014-07-20

## 2014-07-20 MED ORDER — ONDANSETRON 4 MG PO TBDP: 4.0000 mg | ORAL_TABLET | Freq: Four times a day (QID) | ORAL | Status: DC | PRN

## 2014-07-20 MED ORDER — LORAZEPAM 0.5 MG PO TABS: 0.5000 mg | ORAL_TABLET | ORAL | Status: DC | PRN

## 2014-07-20 MED ORDER — ACETAMINOPHEN 325 MG PO TABS: 650.0000 mg | ORAL_TABLET | Freq: Four times a day (QID) | ORAL | Status: DC | PRN

## 2014-07-20 MED ORDER — HALOPERIDOL LACTATE 2 MG/ML PO CONC
0.5000 mg | ORAL | Status: DC | PRN
  Filled 2014-07-20: qty 0.3

## 2014-07-20 MED ORDER — MORPHINE SULFATE 2 MG/ML IJ SOLN: 2.0000 mg | INTRAMUSCULAR | Status: DC | PRN

## 2014-07-20 MED ORDER — LORAZEPAM 2 MG/ML PO CONC: 0.5000 mg | ORAL | Status: DC | PRN

## 2014-07-20 MED ORDER — ONDANSETRON HCL 4 MG/2ML IJ SOLN: 4.0000 mg | Freq: Four times a day (QID) | INTRAMUSCULAR | Status: DC | PRN

## 2014-07-20 MED ORDER — LORAZEPAM 2 MG/ML IJ SOLN: 1.0000 mg | INTRAMUSCULAR | Status: DC | PRN

## 2014-07-20 MED ORDER — LORAZEPAM 2 MG/ML IJ SOLN: 0.5000 mg | INTRAMUSCULAR | Status: DC | PRN

## 2014-07-21 ENCOUNTER — Encounter (HOSPITAL_COMMUNITY): Payer: Self-pay

## 2014-07-21 ENCOUNTER — Telehealth: Payer: Self-pay | Admitting: *Deleted

## 2014-07-21 NOTE — Telephone Encounter (Signed)
Justin Lang called to notify Dr. Benay Spice this patient died last night at [facility].  Will notify Dr. Benay Spice.

## 2014-07-22 ENCOUNTER — Telehealth: Payer: Self-pay | Admitting: *Deleted

## 2014-07-22 NOTE — Telephone Encounter (Signed)
Received VM that patient expired 08-03-2014 at 830 pm. Message has already been sent to Dr. Benay Spice.

## 2014-07-27 ENCOUNTER — Ambulatory Visit: Admitting: Oncology

## 2014-07-27 ENCOUNTER — Other Ambulatory Visit

## 2014-07-28 NOTE — Discharge Summary (Signed)
Physician Discharge Summary  Justin Lang. XNT:[id number] DOB: [birth year] DOA: 08-06-14  PCP: Delphina Cahill, MD  Admit date: 2014-08-06 Discharge date: 08/06/14  Time spent: 15 minutes  Patient pronounced dead on 06-Aug-2014 at 8:30 pm  Discharge Diagnoses:  Principal Problem:   Gastrointestinal bleed   Active Problems:   DVT (deep venous thrombosis), unspecified laterality   Cardiomyopathy   Metastasis from colon cancer    Filed Weights   Aug 06, 2014 0237  Weight: 74.844 kg (165 lb)    History of present illness:  81 year old male with metastatic colon cancer on home hospice, history of DVT and PE, peripheral neuropathy, cardiomyopathy with EF of 25% status post pacemaker , S/P IVC filter, and history of bladder outlet obstruction admitted with bleeding per rectum for the past 3 days. Patient became progressively weak and lethargic and was on hospice requesting the patient to be brought to the ED. Pt somnolent and unresponsive on admission. He was hypotensive and hypoxic. Admitted to medical floor on full comfort measures.   Hospital Course:  Hypovolemic shock with lower GI bleed Patient had History of metastatic colon cancer status post adjuvant chemotherapy, on home hospice. Prognosis was extremely poor and expected patient to die within 24 hours. No further intervention given . Symptomatically management for comfort with when necessary morphine and Ativan. Family at bedside who agreed with the plan of care. Patient was pronounced dead on 08-06-14 at 8:30 pm.   Cardiomyopathy Metastatic colon cancer   Code Status: DO NOT RESUSCITATE, full comfort measures Family Communication: Wife and other family members at bedside Disposition Plan: patient likely to expire within 24 hrs   Consultants:  none  Procedures:  none  Antibiotics:  none    Discharge Exam: Filed Vitals:   2014/08/06 1800  BP: 80/40  Pulse: 64  Temp: 97.4 F (36.3 C)  Resp:        D Allergies  Allergen Reactions  . Vectibix [Panitumumab] Other (See Comments)    On 08/20/12 during Vectibix infusion at [facility] developed difficulty swallowing, unable answer questions due to lips/throat feeling "funny", confusion, with no affect. Has difficulty getting words out.  . Celecoxib Rash    CELEBREX      The results of significant diagnostics from this hospitalization (including imaging, microbiology, ancillary and laboratory) are listed below for reference.    Significant Diagnostic Studies: No results found.  Microbiology: No results found for this or any previous visit (from the past 240 hour(s)).   Labs: Basic Metabolic Panel: No results for input(s): NA, K, CL, CO2, GLUCOSE, BUN, CREATININE, CALCIUM, MG, PHOS in the last 168 hours. Liver Function Tests: No results for input(s): AST, ALT, ALKPHOS, BILITOT, PROT, ALBUMIN in the last 168 hours. No results for input(s): LIPASE, AMYLASE in the last 168 hours. No results for input(s): AMMONIA in the last 168 hours. CBC: No results for input(s): WBC, NEUTROABS, HGB, HCT, MCV, PLT in the last 168 hours. Cardiac Enzymes: No results for input(s): CKTOTAL, CKMB, CKMBINDEX, TROPONINI in the last 168 hours. BNP: BNP (last 3 results) No results for input(s): BNP in the last 8760 hours.  ProBNP (last 3 results)  Recent Labs  10/01/13 1845  PROBNP 17357.0*    CBG: No results for input(s): GLUCAP in the last 168 hours.     SignedLouellen Molder  [facility] 07/28/2014, 5:14 PM

## 2014-08-16 NOTE — Progress Notes (Signed)
Called to room by family member. Patient found with no pulse, no respiration and no heart tones. Ninfa Linden, RN verified and death was pronounced at 2030. Family was at bedside. On call provider notified. CDS notified. All paperwork completed. Post mortem care given, IV removed and ready to be release to funeral home.

## 2014-08-16 NOTE — Progress Notes (Signed)
[facility]) Chaplain Visit: Met with wife and family in room to provide additional spiritual support.  Chaplain familiar with pt/family from hospice visits in home.  Pt unresponsive with significant apnea pauses, which alarms family.  Wife very fatigued at pt's extended dying process, open with tears in grief at hoping pt would not linger any longer.  Family pastor in earlier, and family also thankful for hospital chaplain visits.  Family able to share in life review and use humor about pt's stubborn personality.  Family open to spiritual reassurance and prayer.  Family understands hospice team is available for additional support if needed.   Carlus Pavlov, ThM, Chaplain [facility]

## 2014-08-16 NOTE — ED Notes (Signed)
Monitor placed on comfort care per RN.

## 2014-08-16 NOTE — ED Notes (Signed)
Admitting MD at bedside.

## 2014-08-16 NOTE — ED Notes (Signed)
Pt presents to ED via EMS c/o rectal bleeding and hematuria x 1 day.  EMS reports dark red blood in urine as well as bright red blood in stool. Vitals en route were BP 90/46, pulse 110, RR 20, CBG 168.  20G IV inserted in left AC running 250cc fluids on arrival, pt still hypotensive.  RN obtained blood samples from IV site and started normal saline bolus 1000cc.  Pt denies pain, nausea, vomiting, chills, fever.  Hx of metastatic colon cancer and previous Warfarin use, although EMS states that warfarin treatment has been stopped. Pt is disoriented but responds to questions.

## 2014-08-16 NOTE — ED Notes (Signed)
During pt assessment by MD, it was discovered that he is actively hemorrhaging from his rectum, with bright red blood and dark red clots steadily streaming from his body.  MD spoke with pt's wife about decisions for care and it was determined that we will proceed with comfort care only.  Discontinued fluids per MD request; provided warm blankets and repositioned pt who still denies any pain at this time.  Wife and family friend state that they do not have any needs at this time and wish instead to call loved ones.  Will continue to monitor.

## 2014-08-16 NOTE — ED Notes (Signed)
Dr Posey Pronto notified of pt's condition  B/P 58/36  Cancelled bed request  Family at bedside  Dr Leonides Schanz in with pt and family   Pt cleaned of large amt of blood  Pt actively bleeding red blood with clots noted

## 2014-08-16 NOTE — ED Provider Notes (Addendum)
TIME SEEN: 2:40 AM  CHIEF COMPLAINT: Rectal bleeding, hematuria  HPI: Pt is a 81 y.o. male with history of metastatic colon cancer on hospice who presents to the emergency department with one day of hematuria and bright red blood per rectum. Wife reports that symptoms started tonight. She states that they were so much blood she felt she could not handle it at home. She called hospice who instructed her to come to the hospital. Was recently admitted to the hospital for hematuria. Was on Coumadin but states that this has been stopped. No melena. Patient is disoriented from his baseline.  ROS: Level V caveat for altered mental status  PAST MEDICAL HISTORY/PAST SURGICAL HISTORY:  Past Medical History  Diagnosis Date  . DVT (deep venous thrombosis)     hx of s/p Greenfield filter  . OAB (overactive bladder)   . Arthritis   . History of nephrolithiasis   . History of ankle fracture   . 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  . Peripheral neuropathy   . Vitamin B12 deficiency   . Symptomatic bradycardia   . Sinoatrial node dysfunction   . Pacemaker 4 yrs ago  . Allergy     celebrex  . History of radiation therapy 09/02/00-10/07/00    recurrent colon cancer/left periaortic nodal recurrence=4500cGy/25 fxs,Dr.Wu  . Hx SBO     multiple intermittent and surgery  . History of chemotherapy     "stopped chemo due to heart stopping"  . History of kidney stones   . Colon cancer     hx of metastic- treated with surgery and readiation  . Anxiety     "due to memory loss"  . Short-term memory loss   . GERD (gastroesophageal reflux disease)     hx of    MEDICATIONS:  Prior to Admission medications   Medication Sig Start Date End Date Taking? Authorizing Provider  bisacodyl (DULCOLAX) 10 MG suppository Place 1 suppository (10 mg total) rectally as needed for moderate constipation. 06/11/14   Everrett Coombe, MD  carvedilol (COREG) 6.25 MG tablet Take 6.25 mg by mouth 2 (two) times  daily with a meal.    Historical Provider, MD  feeding supplement, ENSURE COMPLETE, (ENSURE COMPLETE) LIQD Take 237 mLs by mouth 2 (two) times daily between meals. 10/11/13   Barton Dubois, MD  fluticasone (CUTIVATE) 0.05 % cream Apply 1 application topically daily as needed (Applied to face when needed for irritation).  09/08/13   Historical Provider, MD  haloperidol (HALDOL) 0.5 MG tablet Take 1 tablet (0.5 mg total) by mouth every 12 (twelve) hours as needed for agitation. Patient not taking: Reported on 02/23/2014 10/11/13   Barton Dubois, MD  loperamide (IMODIUM) 2 MG capsule Take 2 mg by mouth 3 (three) times daily as needed for diarrhea or loose stools (diarrhea).     Historical Provider, MD  morphine (ROXANOL) 20 MG/ML concentrated solution Take 0.25-0.5 mLs (5-10 mg total) by mouth every 2 (two) hours as needed for severe pain. Patient not taking: Reported on 02/23/2014 11/16/13   Ladell Pier, MD  sorbitol 70 % SOLN Take 30 mLs by mouth daily as needed for moderate constipation. 05/20/14   Costin Karlyne Greenspan, MD    ALLERGIES:  Allergies  Allergen Reactions  . Vectibix [Panitumumab] Other (See Comments)    On 08/20/12 during Vectibix infusion at [facility] developed difficulty swallowing, unable answer questions due to lips/throat feeling "funny", confusion, with no affect. Has difficulty getting words out.  Marland Kitchen  Celecoxib Rash    CELEBREX    SOCIAL HISTORY:  History  Substance Use Topics  . Smoking status: Never Smoker   . Smokeless tobacco: Never Used  . Alcohol Use: No    FAMILY HISTORY: Family History  Problem Relation Age of Onset  . Anesthesia problems Neg Hx   . Hypotension Neg Hx   . Malignant hyperthermia Neg Hx   . Pseudochol deficiency Neg Hx   . Cancer Sister     EXAM: BP 90/57 mmHg  Pulse 104  Temp(Src) 98.5 F (36.9 C) (Oral)  Resp 18  Ht '6\' 7"'$  (2.007 m)  Wt 165 lb (74.844 kg)  BMI 18.58 kg/m2  SpO2 97% CONSTITUTIONAL: Alert to person but not place and time, does  not answer questions appropriately, elderly, appears pale, chronically ill-appearing HEAD: Normocephalic EYES: Conjunctivae clear, PERRL, conjunctival pallor ENT: normal nose; no rhinorrhea; moist mucous membranes; pharynx without lesions noted NECK: Supple, no meningismus, no LAD  CARD: Regular and tachycardic; S1 and S2 appreciated; no murmurs, no clicks, no rubs, no gallops RESP: Normal chest excursion without splinting or tachypnea; breath sounds clear and equal bilaterally; no wheezes, no rhonchi, no rales, no hypoxia or respiratory distress, speaking full sentences ABD/GI: Normal bowel sounds; non-distended; soft, non-tender, no rebound, no guarding, no peritoneal signs GU:  Circumcised male, gross red blood coming from the urethral meatus, no discharge, no testicular masses or tenderness, no scrotal swelling RECTAL:  Large amount of bright red blood actively coming out of patient's rectum, no melena BACK:  The back appears normal and is non-tender to palpation, there is no CVA tenderness EXT: Normal ROM in all joints; non-tender to palpation; no edema; normal capillary refill; no cyanosis, no calf tenderness or swelling    SKIN: Normal color for age and race; warm NEURO: Moves all extremities equally   MEDICAL DECISION MAKING: Patient here with hematuria and rectal bleeding. He is hypotensive, tachycardic, appears pale. Have had long discussion with patient's wife at bedside. She states the patient is a DO NOT RESUSCITATE/DO NOT INTUBATE and at this time she would not want any further workup. She states that she felt she could not handle his symptoms at home. She would like for him to be admitted to the hospital. She is aware that his illness is life-threatening. She would like for him to be made comfortable. We'll discuss with hospitalist for admission for comfort care.  ED PROGRESS: Discussed with Dr. Posey Pronto for admission to medical bed, inpatient.     [place], DO 01-Aug-2014  0342   5:25 AM  Pt rapidly declined in the emergency department. Decision was made to keep him in the ER rather than transfer him upstairs - per family's request as well. Family at bedside. Chaplain at bedside.  Dr. Posey Pronto updated.     [place], DO 08/01/14 [place], DO 08-01-14 [phone]

## 2014-08-16 NOTE — Progress Notes (Signed)
During rounds, chaplain visited with patient and family. Chaplain provided emotional support and presence. Family told chaplain that they were all right and did not need anything at this time. Family thanked chaplain for coming. Chaplain will continue to follow.   Jul 30, 2014 1300  Clinical Encounter Type  Visited With Patient and family together  Visit Type Patient actively dying  Referral From Chaplain;Physician  Consult/Referral To Chaplain

## 2014-08-16 NOTE — ED Notes (Signed)
Bed: NX83 Expected date:  Expected time:  Means of arrival:  Comments: EMS 81 yo male rectal bleeding/cancer patient

## 2014-08-16 NOTE — Progress Notes (Addendum)
Justin Lang and son Gerald Stabs were bedside when I arrived. They were appropriately grieving the impending loss of [name] (husband of 85 years). Two more sons arrived, a daughter and in-laws. Staff is watching pt closely and as of this writing. The family is requesting [facility] in [place] to be called when the time comes. Chaplain Marlise Eves Holder   Jul 26, 2014 0800  Clinical Encounter Type  Visited With Family;Patient and family together

## 2014-08-16 NOTE — H&P (Signed)
[facility] History and Physical  Patient: Justin Lang.  MRN: [id number]  DOB: 08-06-1922  DOS: the patient was seen and examined on 2014-08-08 PCP: Delphina Cahill, MD  Referring physician: Dr. Leonides Lang Chief Complaint: BRBPR  HPI: Justin Lang. is a 81 y.o. male with Past medical history of Metastatic colon cancer, history of DVT PE, peripheral neuropathy, pacemaker implant, IVC filter, on hospice, cardiomyopathy with ejection fraction of 25%, bladder outlet obstruction secondary to BPH. The patient was brought in by wife due to complaints of bright red blood per rectum. The patient was apparently at his baseline but since last 3 days has been having occasional on and off episodes of bleeding per rectum. Today he started having episodes of continuous slow bright red blood per rectum with clots. With this the patient was significantly lethargic and weak and she called hospice requested the patient to be brought to ER for further management. Patient did not have any vomiting that have any chest pain or abdominal pain. Nodes recent change in medications reported. As per family has also has been working for patient at home but last admission lorazepam was not effective to control patient's anxiety and agitation.   The patient is coming from home.  At his baseline ambulates bedridden And is dependent for most of his ADL does not manages her medication on his own.  Review of Systems: as mentioned in the history of present illness.  A comprehensive review of the other systems is negative.  Past Medical History  Diagnosis Date  . DVT (deep venous thrombosis)     hx of s/p Greenfield filter  . OAB (overactive bladder)   . Arthritis   . History of nephrolithiasis   . History of ankle fracture   . 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  . Peripheral neuropathy   . Vitamin B12 deficiency   . Symptomatic bradycardia   . Sinoatrial node dysfunction   . Pacemaker 4  yrs ago  . Allergy     celebrex  . History of radiation therapy 09/02/00-10/07/00    recurrent colon cancer/left periaortic nodal recurrence=4500cGy/25 fxs,Dr.Wu  . Hx SBO     multiple intermittent and surgery  . History of chemotherapy     "stopped chemo due to heart stopping"  . History of kidney stones   . Colon cancer     hx of metastic- treated with surgery and readiation  . Anxiety     "due to memory loss"  . Short-term memory loss   . GERD (gastroesophageal reflux disease)     hx of   Past Surgical History  Procedure Laterality Date  . Colectomy      sigmoid secondary to colon ca- 1997  . Exploratory laparotomy      metastatic retroperitoneal lymph node- 2002  . Total knee arthroplasty      right-1991; left-1992  . Back surgeries      x3  . Filter replacement    . Left ankle surgery    . Incisional herniorraphy    . Pacemaker insertion  05/19/2008  . Replacement total knee bilateral    . Colon resection    . Colonoscopy  08/18/2010    Procedure: COLONOSCOPY;  Surgeon: Rogene Houston, MD;  Location: [facility];  Service: Endoscopy;  Laterality: N/A;  . Esophagogastroduodenoscopy  08/18/2010    Procedure: ESOPHAGOGASTRODUODENOSCOPY (EGD);  Surgeon: Rogene Houston, MD;  Location: [facility];  Service: Endoscopy;  Laterality:  N/A;  . Partial colectomy  11/28/2011    Procedure: PARTIAL COLECTOMY;  Surgeon: Jamesetta So, MD;  Location: [facility] ORS;  Service: General;  Laterality: N/A;  . Laparoscopic ileocecectomy  11/28/11  . Appendectomy    . Hernia repair    . Tonsillectomy      as child  . Lithotripsy    . Cataract extraction Bilateral   . Cystoscopy w/ ureteral stent placement Right 03/17/2014    Procedure: CYSTOSCOPY WITH STENT REPLACEMENT;  Surgeon: Bernestine Amass, MD;  Location: [facility];  Service: Urology;  Laterality: Right;  . Holmium laser application N/A 04/17/3293    Procedure: HOLMIUM LASER APPLICATION OF BLADDER STONE;  Surgeon: Bernestine Amass, MD;  Location:  [facility] ORS;  Service: Urology;  Laterality: N/A;   Social History:  reports that he has never smoked. He has never used smokeless tobacco. He reports that he does not drink alcohol or use illicit drugs.  Allergies  Allergen Reactions  . Vectibix [Panitumumab] Other (See Comments)    On 08/20/12 during Vectibix infusion at [facility] developed difficulty swallowing, unable answer questions due to lips/throat feeling "funny", confusion, with no affect. Has difficulty getting words out.  . Celecoxib Rash    CELEBREX    Family History  Problem Relation Age of Onset  . Anesthesia problems Neg Hx   . Hypotension Neg Hx   . Malignant hyperthermia Neg Hx   . Pseudochol deficiency Neg Hx   . Cancer Sister     Prior to Admission medications   Medication Sig Start Date End Date Taking? Authorizing Provider  acetaminophen (TYLENOL) 500 MG tablet Take 1,000 mg by mouth every 6 (six) hours as needed for mild pain.   Yes Historical Provider, MD  bisacodyl (DULCOLAX) 10 MG suppository Place 1 suppository (10 mg total) rectally as needed for moderate constipation. 06/11/14  Yes Everrett Coombe, MD  carvedilol (COREG) 6.25 MG tablet Take 6.25 mg by mouth 2 (two) times daily with a meal. Only takes if heart rate is increased   Yes Historical Provider, MD  feeding supplement, ENSURE COMPLETE, (ENSURE COMPLETE) LIQD Take 237 mLs by mouth 2 (two) times daily between meals. 10/11/13  Yes Barton Dubois, MD  fluticasone (CUTIVATE) 0.05 % cream Apply 1 application topically daily as needed (Applied to face when needed for irritation).  09/08/13  Yes Historical Provider, MD  haloperidol (HALDOL) 0.5 MG tablet Take 1 tablet (0.5 mg total) by mouth every 12 (twelve) hours as needed for agitation. 10/11/13  Yes Barton Dubois, MD  morphine (ROXANOL) 20 MG/ML concentrated solution Take 0.25-0.5 mLs (5-10 mg total) by mouth every 2 (two) hours as needed for severe pain. 11/16/13  Yes Ladell Pier, MD  loperamide (IMODIUM) 2 MG  capsule Take 2 mg by mouth 3 (three) times daily as needed for diarrhea or loose stools (diarrhea).     Historical Provider, MD  sorbitol 70 % SOLN Take 30 mLs by mouth daily as needed for moderate constipation. Patient not taking: Reported on August 06, 2014 05/20/14   Caren Griffins, MD    Physical Exam: Filed Vitals:   08-06-14 0330 06-Aug-2014 0436 August 06, 2014 0522 August 06, 2014 0538  BP: 76/52 58/36  51/32  Pulse: 101 60 81 84  Temp:      TempSrc:      Resp: 15     Height:      Weight:      SpO2: 88% 89% 96% 96%    General: Drowsy but easily arousable.  Appear in mild distress ENT: Oral Mucosa clear Cardiovascular: S1 and S2 Present, no Murmur, Peripheral Pulses Present Respiratory: Bilateral Air entry equal and Decreased,  Abdomen: Bowel Sound present, Skin: no Rash Extremities: no Pedal edema,  Neurologic: Grossly no focal neuro deficit.  Labs on Admission:  CBC: No results for input(s): WBC, NEUTROABS, HGB, HCT, MCV, PLT in the last 168 hours.  CMP     Component Value Date/Time   NA 140 05/20/2014 0415   NA 139 12/30/2012 1141   K 4.1 05/20/2014 0415   K 4.3 12/30/2012 1141   CL 113* 05/20/2014 0415   CO2 17* 05/20/2014 0415   CO2 26 12/30/2012 1141   GLUCOSE 96 05/20/2014 0415   GLUCOSE 86 12/30/2012 1141   BUN 74* 05/20/2014 0415   BUN 17.4 12/30/2012 1141   CREATININE 7.13* 05/20/2014 0415   CREATININE 1.7* 12/30/2012 1141   CALCIUM 8.3* 05/20/2014 0415   CALCIUM 8.9 12/30/2012 1141   PROT 6.8 05/18/2014 0402   PROT 6.0* 09/17/2012 1030   ALBUMIN 2.6* 05/18/2014 0402   ALBUMIN 2.7* 09/17/2012 1030   AST 12* 05/18/2014 0402   AST 16 09/17/2012 1030   ALT 9* 05/18/2014 0402   ALT 9 09/17/2012 1030   ALKPHOS 51 05/18/2014 0402   ALKPHOS 57 09/17/2012 1030   BILITOT 0.7 05/18/2014 0402   BILITOT 0.58 09/17/2012 1030   GFRNONAA 6* 05/20/2014 0415   GFRAA 7* 05/20/2014 0415    No results for input(s): LIPASE, AMYLASE in the last 168 hours.  No results for  input(s): CKTOTAL, CKMB, CKMBINDEX, TROPONINI in the last 168 hours. BNP (last 3 results) No results for input(s): BNP in the last 8760 hours.  ProBNP (last 3 results)  Recent Labs  10/01/13 1845  PROBNP 17357.0*     Radiological Exams on Admission: No results found. Assessment/Plan Principal Problem:   Gastrointestinal bleed Active Problems:   DVT (deep venous thrombosis), unspecified laterality   Cardiomyopathy   Metastasis from colon cancer   1. Gastrointestinal bleed The patient is presenting with complaints of bright red blood per rectum. Patient has multiple clots and has continuous diarrhea with blood. Patient has history of metastatic colon cancer T4 N0 status post adjuvant chemotherapy now currently on hospice. With this findings the patient progresses significantly poor and the family is aware of that. Patient will be admitted in the hospital for hospice for symptom management as the patient does not appear to be tolerating oral medication. Symptom management with when necessary morphine and Ativan and Haldol. Considering patient's significant comorbidities and ongoing blood loss prognosis is guarded.   Advance goals of care discussion: DNR/DNI on comfort care protocol without any active herpetic measures.   DVT Prophylaxis: On comfort care  Nutrition: Comfort feeds  Family Communication: family was present at bedside, opportunity was given to ask question and all questions were answered satisfactorily at the time of interview. Disposition: Admitted as inpatient, med-surge unit.  Author: Berle Mull, MD [facility] Pager: [phone] 08-02-2014  If 7PM-7AM, please contact night-coverage [email or web address] Password [email or web address]

## 2014-08-16 NOTE — Progress Notes (Signed)
Met with family to introduce them to [employer]. Another son had arrived in addition to family Justin Lang. Please call when additional assistance is needed. Justin Lang   2014/08/06 3810  Clinical Encounter Type  Visited With Family

## 2014-08-16 NOTE — Progress Notes (Signed)
I learned Mr. Nole passed about 40 minutes prior to my arrival. His wife, children, grandchildren, in-laws were bedside. Family members were appropriately tearful. His wife repeated several times that "he is in a better place." After offering my condolences, pt's son-in-law asked if I would pray w/family. With his wife's permission we held hands and had prayer bedside of pt. This family was very saddened but accepting of the rapid decline and loss of their loved one today. Chaplain Marlise Eves Holder   13-Aug-2014 2100  Clinical Encounter Type  Visited With Family

## 2014-08-16 NOTE — ED Notes (Signed)
MD at bedside. 

## 2014-08-16 NOTE — ED Notes (Signed)
Due to patient's comfort care status, blood pressure and temperature are not up to date. This RN determines that measuring blood pressure or temperature would only cause pt discomfort and would not change medical staff's course of action.

## 2014-08-16 NOTE — Progress Notes (Signed)
Pt admitted to room after being brought to hospital via 911.  Pt actively dying.  Many family members present.  Wife, Justin Lang has been his PCG.  Wife very tired and fatigued.  Provided grief and loss counseling support.  [place], [place] ext: [phone]

## 2014-08-16 NOTE — Progress Notes (Signed)
[name] pt. admitted with a GI Bleed that is covered and related to his hospice dx of  Colon Ca. Pt's code status is DNR. Pt's wife called [facility] early this morning and was directed to the ED due to pt's large amount of rectal bleeding. Pt. was brought to the ED by EMS. Patient ls lying on his back in the hospital room with multiple famiily members at his bedside including his wife.He is pale and unresponsive. This RN spoke with his wife, Justin Lang and encouraged her to call with any questions and advised DME will be picked up by [facility].  Please call with any questions.  [facility] Liaison [phone]

## 2014-08-16 NOTE — Progress Notes (Signed)
  [facility] PROGRESS NOTE  Justin Lang. PJK:[id number] DOB: 02-10-1922 DOA: 08/15/2014 PCP: Delphina Cahill, MD Admission H&P from early this morning reviewed. A 81-year-old male with metastatic colon cancer on home hospice, history of DVT and PE, peripheral neuropathy, cardiomyopathy with EF of 25% status post pacemaker , S/P IVC filter, and history of bladder outlet obstruction admitted with bleeding per rectum for the past 3 days. Patient became progressively weak and lethargic and was on hospice requesting the patient to be brought to the ED. Pt somnolent and unresponsive on admission. He was hypotensive and hypoxic. Admitted to medical floor  on full comfort measures.  Assessment/Plan: Lower GI bleed History of metastatic colon cancer status post adjuvant chemotherapy and now on home hospice. Prognosis is extremely poor and patient most likely expire this hospitalization. No further intervention. Symptomatically management for comfort with when necessary morphine and Ativan.  Cardiomyopathy Metastatic colon cancer   Code Status: DO NOT RESUSCITATE, full comfort measures Family Communication: Wife and other family members at bedside Disposition Plan: patient likely to expire within 24 hrs   Consultants:  none  Procedures:  none  Antibiotics:  none  HPI/Subjective: Seen and examined. Admission H&P reviewed. Wife and multiple family members at bedside. Pt unresponsive to commands with shallow respirations  Objective: Filed Vitals:   2014-08-15 0730  BP:   Pulse: 78  Temp:   Resp:    No intake or output data in the 24 hours ending 15-Aug-2014 1515 Filed Weights   2014-08-15 0237  Weight: 74.844 kg (165 lb)    Exam:   General:  Elderly cachetic  male lying in bed unresponsive with shallow respirations.  Cardiovascular: Normal S1 and S2, no murmurs  Respiratory: Diminished bilateral breath sounds  Abdomen: , Nondistended, nontender    Data  Reviewed: Basic Metabolic Panel: No results for input(s): NA, K, CL, CO2, GLUCOSE, BUN, CREATININE, CALCIUM, MG, PHOS in the last 168 hours. Liver Function Tests: No results for input(s): AST, ALT, ALKPHOS, BILITOT, PROT, ALBUMIN in the last 168 hours. No results for input(s): LIPASE, AMYLASE in the last 168 hours. No results for input(s): AMMONIA in the last 168 hours. CBC: No results for input(s): WBC, NEUTROABS, HGB, HCT, MCV, PLT in the last 168 hours. Cardiac Enzymes: No results for input(s): CKTOTAL, CKMB, CKMBINDEX, TROPONINI in the last 168 hours. BNP (last 3 results) No results for input(s): BNP in the last 8760 hours.  ProBNP (last 3 results)  Recent Labs  10/01/13 1845  PROBNP 17357.0*    CBG: No results for input(s): GLUCAP in the last 168 hours.  No results found for this or any previous visit (from the past 240 hour(s)).   Studies: No results found.  Scheduled Meds:  Continuous Infusions: . sodium chloride        Time spent: 20 minutes    Dudley Cooley, [place]  [facility] Pager [phone]. If 7PM-7AM, please contact night-coverage at [email or web address], password [email or web address] 08-15-2014, 3:15 PM  LOS: 0 days

## 2014-08-16 DEATH — deceased

## 2015-07-04 ENCOUNTER — Other Ambulatory Visit: Payer: Self-pay | Admitting: Nurse Practitioner

## 2015-07-06 ENCOUNTER — Other Ambulatory Visit: Payer: Self-pay | Admitting: Nurse Practitioner

## 2015-11-07 IMAGING — CT CT ABD-PELV W/O CM
2 of 4 series · 16 of 46 positions shown, 18 images · non-contrast
Comparison: 08/08/2012

CLINICAL DATA: Evaluate for retroperitoneal bleed. History of colon
cancer. Patient complains of right flank pain.

EXAM:
CT ABDOMEN AND PELVIS WITHOUT CONTRAST
TECHNIQUE: Multidetector CT imaging of the abdomen and pelvis was performed
following the standard protocol without intravenous contrast.

[Series 2: rtn a/p w/o · axial · non-contrast · 0.81mm/px · z∈[-436,-42]mm · 13 of 87 slices shown, 15 images]
[im 4/87  soft-tissue]
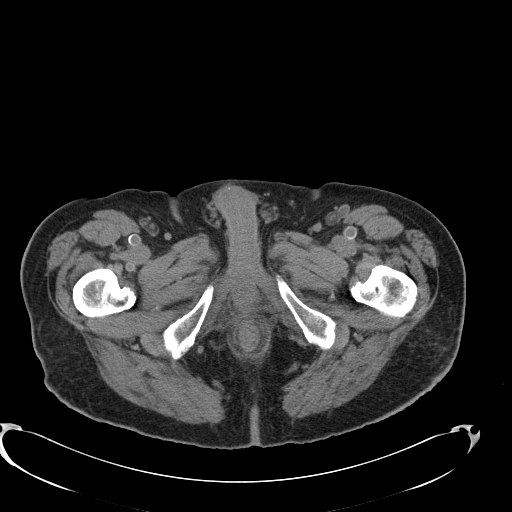
[im 4/87  bone]
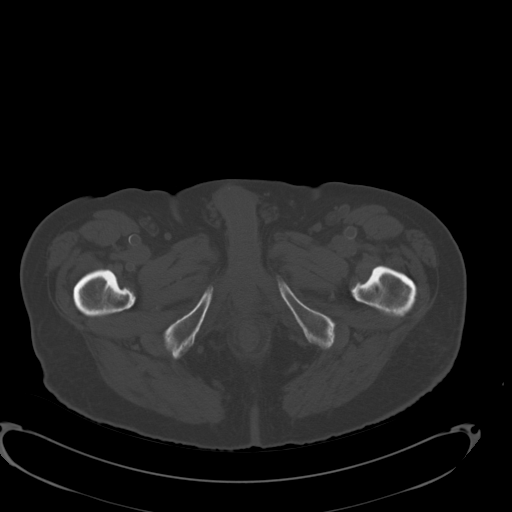
[im 11/87  soft-tissue]
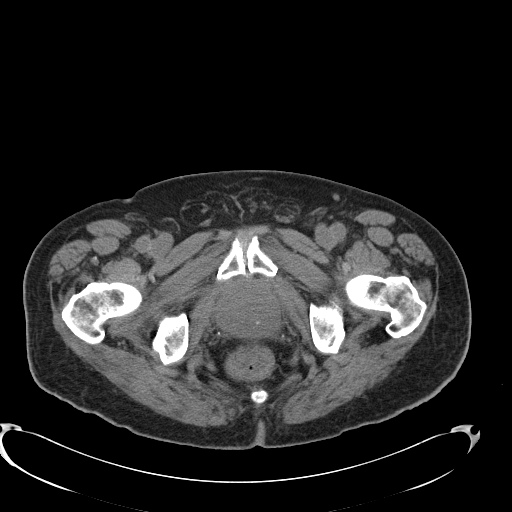
[im 18/87  soft-tissue]
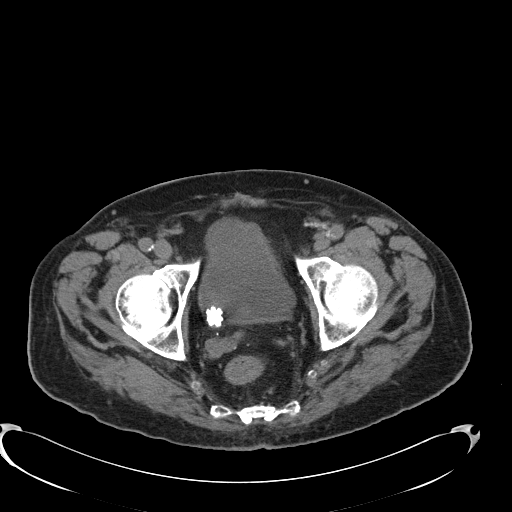
[im 26/87  soft-tissue]
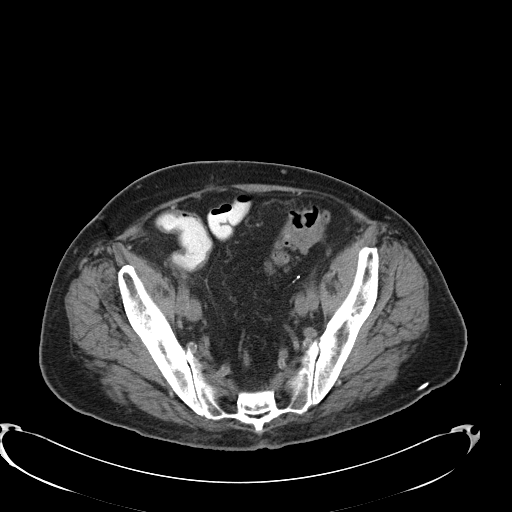
[im 29/87  soft-tissue]
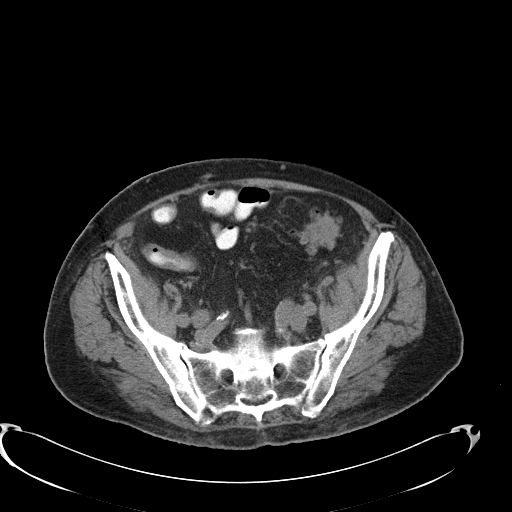
[im 36/87  soft-tissue]
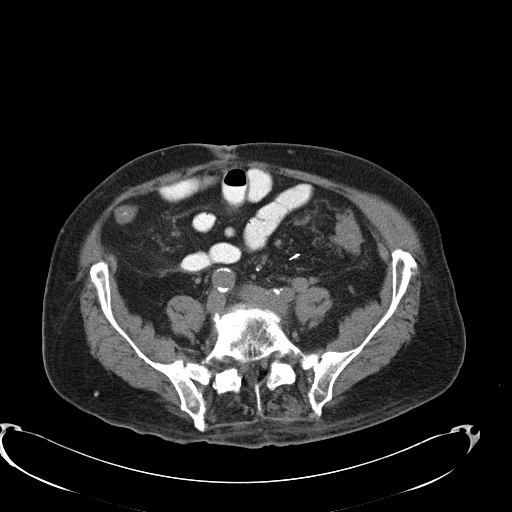
[im 44/87  soft-tissue]
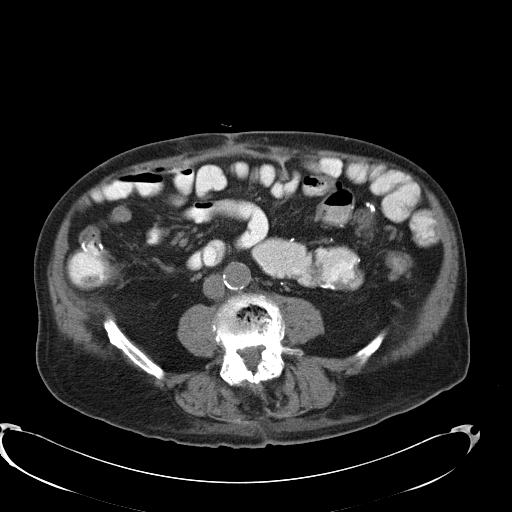
[im 51/87  soft-tissue]
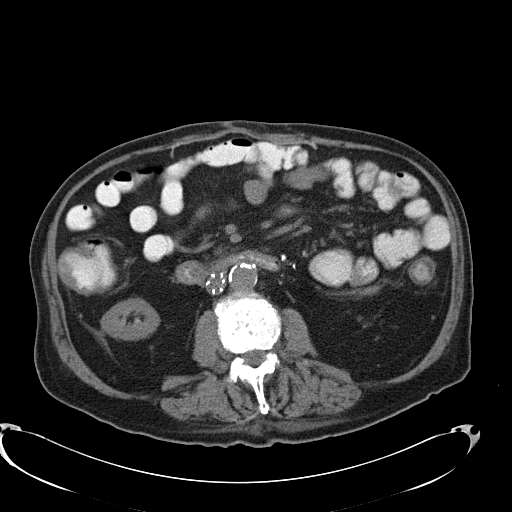
[im 58/87  soft-tissue]
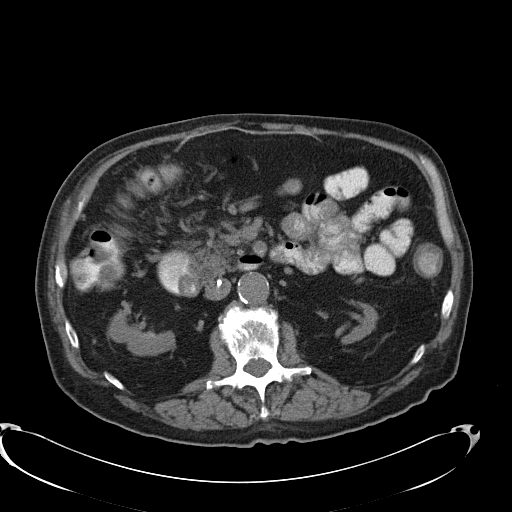
[im 58/87  bone]
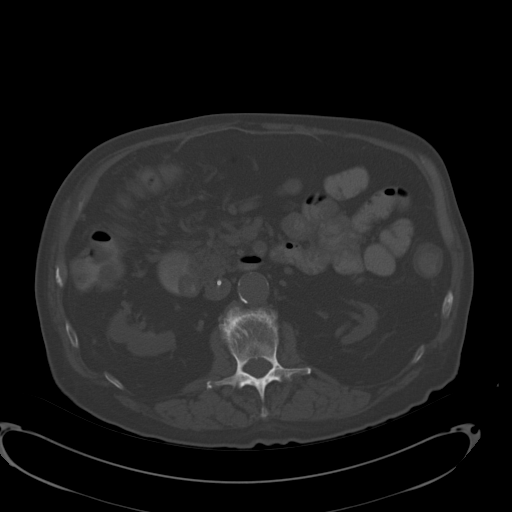
[im 61/87  soft-tissue]
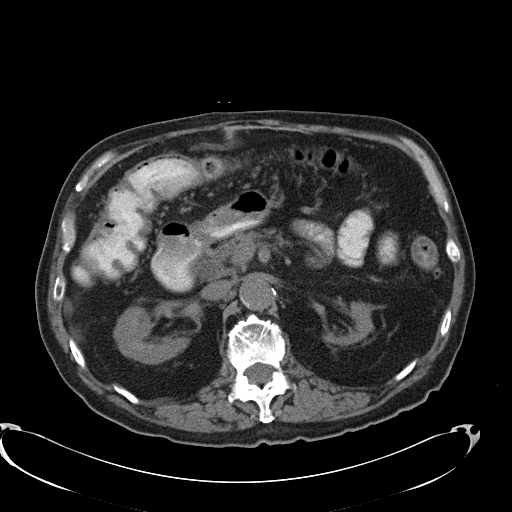
[im 69/87  soft-tissue]
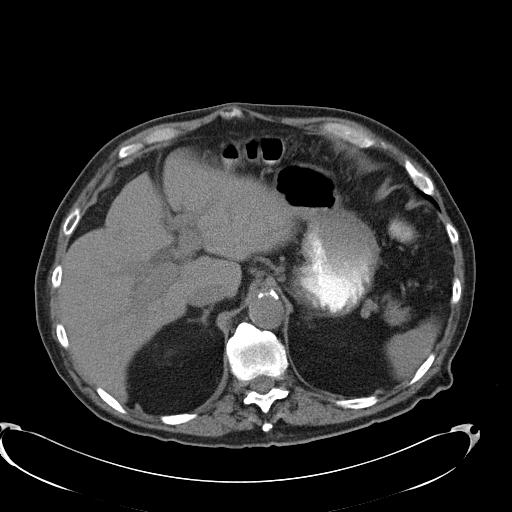
[im 76/87  soft-tissue]
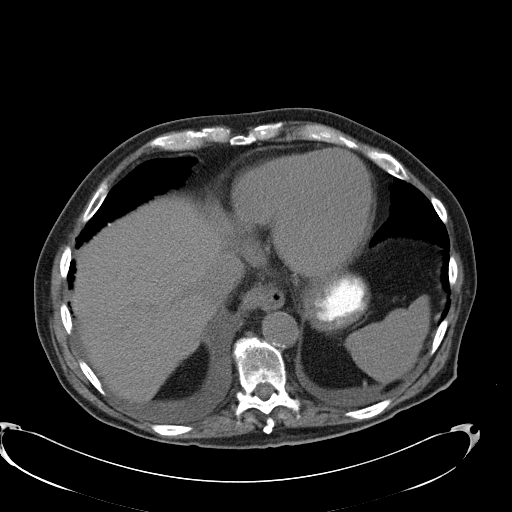
[im 83/87  soft-tissue]
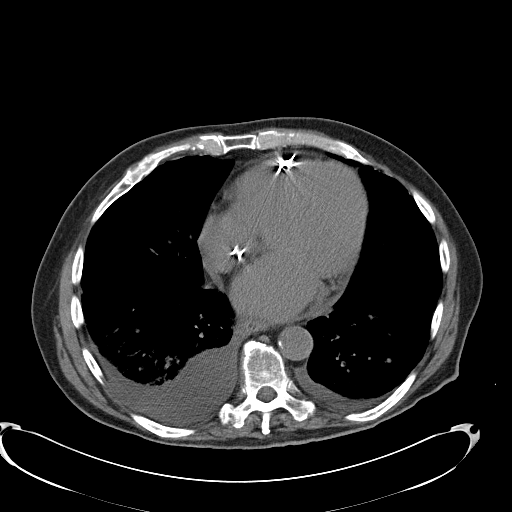

[Series 602: <mpr thick range> · coronal · 0.85mm/px · 3 of 89 slices shown]
[im 30/89  soft-tissue]
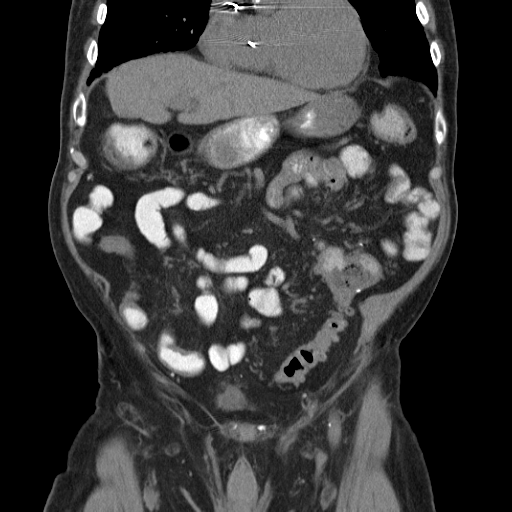
[im 40/89  soft-tissue]
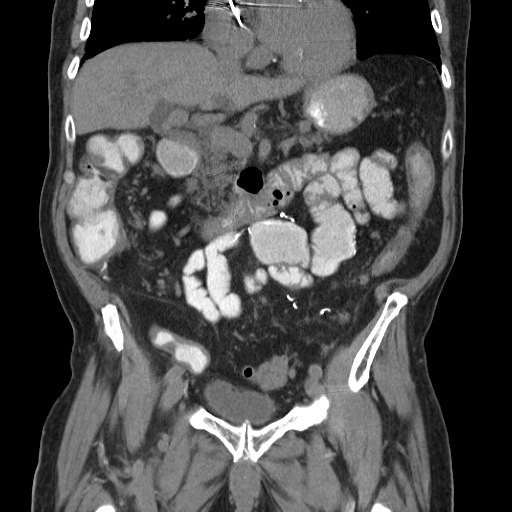
[im 49/89  soft-tissue]
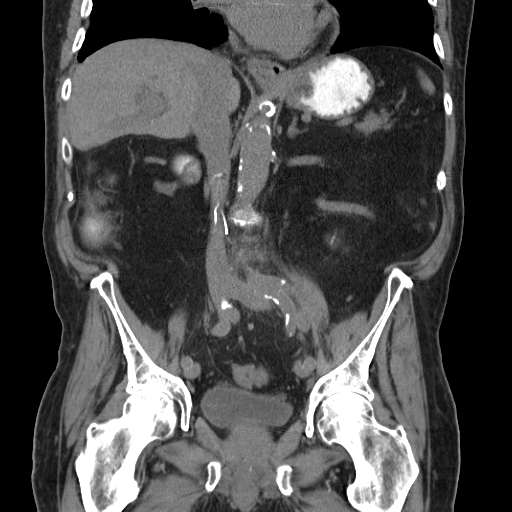

[16 of 46 positions shown; findings below may reference images not displayed]

FINDINGS: Patient has coronary artery calcifications and cardiac pacemaker
wires. There are bilateral pleural effusions, right side greater
than left. This compressive atelectasis at both lung bases.

Previous ventral hernia repair with mesh material. Stable 2 cm
low-density structure in the lateral right hepatic lobe is most
compatible with a cyst. Gallbladder has been removed. Fullness of
the extrahepatic bile duct, measuring 1.5 cm and unchanged. No gross
abnormality to the pancreas, spleen or adrenal glands. Again noted
is an IVC filter. Again noted is renal atrophy, left side greater
than right. Small stone in the mid left kidney. There is a stable
stone in the left renal pelvis measuring 7 mm without
hydronephrosis. Stable 4 mm stone in the right kidney mid pole
without hydronephrosis. There is a chronic large stone in the right
posterior bladder with a stellate appearance. This stone measures
1.8 cm. Abdominal aorta measures 2.7 cm with calcifications.

Patient had a hypermetabolic nodule in the left paracolic gutter.
This nodule roughly measures 7 mm and unchanged from the prior
examination. The mesenteric or peritoneal nodule in the right lower
abdomen has clearly decreased in size. This nodule now measures
x 1.0 cm on sequence 2, image 57 and previously measured 2.0 x
cm.

There is diverticulosis in the sigmoid colon. Again noted are mild
pericolonic densities which appear to be chronic. No evidence for
acute diverticulitis. There is a colonic anastomosis in the
descending colon. There is a small bowel-right colon anastomosis.
Distal small bowel wall is mildly thickened but also not distended.
Few mildly dilated loops of small bowel are similar to previous
examination. No evidence for a bowel obstruction. No significant
free fluid. No evidence for a retroperitoneal hematoma.

Postsurgical changes in lower lumbar spine.
IMPRESSION: Enlargement of bilateral pleural effusions, right side greater than
left.

No acute abnormalities within the abdomen or pelvis.

The right lower quadrant peritoneal nodule has decreased in size.
Minimal change in the left paracolic nodule.

Extensive postsurgical changes as described. No evidence for bowel
obstruction.

Diverticulosis without acute colonic inflammation.

Bilateral kidney stones without hydronephrosis. Stable large stone
within the urinary bladder.
